# Patient Record
Sex: Female | Born: 1975 | Race: White | Hispanic: No | Marital: Married
Health system: Southern US, Academic
[De-identification: ages and names within clinical notes are randomized; demographics above are authoritative.]

## PROBLEM LIST (undated history)

## (undated) ENCOUNTER — Encounter

## (undated) ENCOUNTER — Ambulatory Visit

## (undated) ENCOUNTER — Encounter: Attending: Family Medicine | Primary: Family Medicine

## (undated) ENCOUNTER — Ambulatory Visit: Payer: BLUE CROSS/BLUE SHIELD | Attending: Family Medicine | Primary: Family Medicine

## (undated) ENCOUNTER — Ambulatory Visit: Payer: PRIVATE HEALTH INSURANCE | Attending: Foot and Ankle Surgery | Primary: Foot and Ankle Surgery

## (undated) ENCOUNTER — Encounter
Attending: Student in an Organized Health Care Education/Training Program | Primary: Student in an Organized Health Care Education/Training Program

## (undated) ENCOUNTER — Telehealth

## (undated) ENCOUNTER — Ambulatory Visit: Payer: PRIVATE HEALTH INSURANCE

## (undated) ENCOUNTER — Ambulatory Visit: Attending: Pharmacist | Primary: Pharmacist

## (undated) ENCOUNTER — Encounter: Attending: Diagnostic Radiology | Primary: Diagnostic Radiology

## (undated) ENCOUNTER — Ambulatory Visit: Payer: PRIVATE HEALTH INSURANCE | Attending: Family Medicine | Primary: Family Medicine

## (undated) ENCOUNTER — Ambulatory Visit: Payer: PRIVATE HEALTH INSURANCE | Attending: Otolaryngology | Primary: Otolaryngology

## (undated) ENCOUNTER — Encounter: Attending: Internal Medicine | Primary: Internal Medicine

## (undated) ENCOUNTER — Encounter: Attending: Orthopedic | Primary: Orthopedic

## (undated) ENCOUNTER — Encounter: Attending: Gastroenterology | Primary: Gastroenterology

## (undated) ENCOUNTER — Ambulatory Visit
Payer: PRIVATE HEALTH INSURANCE | Attending: Student in an Organized Health Care Education/Training Program | Primary: Student in an Organized Health Care Education/Training Program

## (undated) ENCOUNTER — Ambulatory Visit: Payer: PRIVATE HEALTH INSURANCE | Attending: Registered" | Primary: Registered"

## (undated) ENCOUNTER — Telehealth: Attending: Otolaryngology | Primary: Otolaryngology

## (undated) ENCOUNTER — Ambulatory Visit: Payer: BLUE CROSS/BLUE SHIELD

## (undated) ENCOUNTER — Encounter: Attending: Registered" | Primary: Registered"

## (undated) ENCOUNTER — Ambulatory Visit
Payer: BLUE CROSS/BLUE SHIELD | Attending: Student in an Organized Health Care Education/Training Program | Primary: Student in an Organized Health Care Education/Training Program

## (undated) ENCOUNTER — Ambulatory Visit: Payer: PRIVATE HEALTH INSURANCE | Attending: Rheumatology | Primary: Rheumatology

## (undated) ENCOUNTER — Telehealth: Attending: Rheumatology | Primary: Rheumatology

## (undated) ENCOUNTER — Telehealth: Attending: Family Medicine | Primary: Family Medicine

## (undated) ENCOUNTER — Encounter: Attending: Orthopaedic Surgery | Primary: Orthopaedic Surgery

## (undated) ENCOUNTER — Ambulatory Visit
Attending: Student in an Organized Health Care Education/Training Program | Primary: Student in an Organized Health Care Education/Training Program

## (undated) ENCOUNTER — Other Ambulatory Visit

## (undated) ENCOUNTER — Ambulatory Visit: Attending: Family | Primary: Family

## (undated) ENCOUNTER — Ambulatory Visit: Payer: PRIVATE HEALTH INSURANCE | Attending: Internal Medicine | Primary: Internal Medicine

## (undated) ENCOUNTER — Telehealth: Attending: Dental | Primary: Dental

## (undated) ENCOUNTER — Ambulatory Visit: Attending: Nurse Practitioner | Primary: Nurse Practitioner

## (undated) ENCOUNTER — Encounter: Attending: Rheumatology | Primary: Rheumatology

## (undated) ENCOUNTER — Ambulatory Visit: Payer: PRIVATE HEALTH INSURANCE | Attending: Women's Health | Primary: Women's Health

## (undated) ENCOUNTER — Telehealth: Payer: PRIVATE HEALTH INSURANCE | Attending: Registered" | Primary: Registered"

## (undated) ENCOUNTER — Encounter: Attending: Geriatric Medicine | Primary: Geriatric Medicine

## (undated) ENCOUNTER — Ambulatory Visit: Payer: BLUE CROSS/BLUE SHIELD | Attending: Geriatric Medicine | Primary: Geriatric Medicine

## (undated) DIAGNOSIS — M329 Systemic lupus erythematosus, unspecified: Secondary | ICD-10-CM

## (undated) DIAGNOSIS — M3500 Sicca syndrome, unspecified: Secondary | ICD-10-CM

## (undated) DIAGNOSIS — IMO0002 Reserved for concepts with insufficient information to code with codable children: Secondary | ICD-10-CM

## (undated) HISTORY — PX: TUBAL LIGATION: SHX77

## (undated) HISTORY — PX: ABDOMINAL HYSTERECTOMY: SHX81

## (undated) HISTORY — PX: CARPAL TUNNEL RELEASE: SHX101

## (undated) HISTORY — PX: HERNIA REPAIR: SHX51

## (undated) MED ORDER — LORATADINE 10 MG TABLET: Freq: Every day | ORAL | 0.00000 days

## (undated) MED ORDER — HYDROXYCHLOROQUINE 200 MG TABLET: Freq: Two times a day (BID) | ORAL | 0.00000 days

## (undated) MED ORDER — MELATONIN 1 MG TABLET: Freq: Every evening | ORAL | 0.00000 days

## (undated) MED ORDER — BENLYSTA 200 MG/ML SUBCUTANEOUS AUTO-INJECTOR: 200 mg | mL | 2 refills | 28 days

## (undated) MED ORDER — LIDOCAINE 4 %-MENTHOL 1 % TOPICAL PATCH: TOPICAL | 0 days

---
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
## 1999-04-20 ENCOUNTER — Inpatient Hospital Stay (HOSPITAL_COMMUNITY): Admission: AD | Admit: 1999-04-20 | Discharge: 1999-04-20 | Payer: Self-pay | Admitting: *Deleted

## 1999-04-20 ENCOUNTER — Encounter: Payer: Self-pay | Admitting: *Deleted

## 1999-06-23 ENCOUNTER — Encounter: Payer: Self-pay | Admitting: Emergency Medicine

## 1999-06-23 ENCOUNTER — Emergency Department (HOSPITAL_COMMUNITY): Admission: EM | Admit: 1999-06-23 | Discharge: 1999-06-23 | Payer: Self-pay | Admitting: Emergency Medicine

## 1999-10-04 ENCOUNTER — Encounter: Admission: RE | Admit: 1999-10-04 | Discharge: 1999-12-12 | Payer: Self-pay | Admitting: Obstetrics & Gynecology

## 1999-10-05 ENCOUNTER — Observation Stay (HOSPITAL_COMMUNITY): Admission: AD | Admit: 1999-10-05 | Discharge: 1999-10-06 | Payer: Self-pay | Admitting: *Deleted

## 1999-10-08 ENCOUNTER — Inpatient Hospital Stay (HOSPITAL_COMMUNITY): Admission: AD | Admit: 1999-10-08 | Discharge: 1999-10-08 | Payer: Self-pay | Admitting: Obstetrics and Gynecology

## 1999-10-12 ENCOUNTER — Encounter: Payer: Self-pay | Admitting: Obstetrics & Gynecology

## 1999-10-12 ENCOUNTER — Ambulatory Visit (HOSPITAL_COMMUNITY): Admission: RE | Admit: 1999-10-12 | Discharge: 1999-10-12 | Payer: Self-pay | Admitting: Obstetrics & Gynecology

## 1999-11-13 ENCOUNTER — Inpatient Hospital Stay (HOSPITAL_COMMUNITY): Admission: AD | Admit: 1999-11-13 | Discharge: 1999-11-13 | Payer: Self-pay | Admitting: Obstetrics and Gynecology

## 1999-11-24 ENCOUNTER — Inpatient Hospital Stay (HOSPITAL_COMMUNITY): Admission: AD | Admit: 1999-11-24 | Discharge: 1999-11-24 | Payer: Self-pay | Admitting: Obstetrics and Gynecology

## 1999-12-09 ENCOUNTER — Inpatient Hospital Stay (HOSPITAL_COMMUNITY): Admission: AD | Admit: 1999-12-09 | Discharge: 1999-12-09 | Payer: Self-pay | Admitting: Obstetrics and Gynecology

## 1999-12-15 ENCOUNTER — Inpatient Hospital Stay (HOSPITAL_COMMUNITY): Admission: AD | Admit: 1999-12-15 | Discharge: 1999-12-17 | Payer: Self-pay | Admitting: Obstetrics & Gynecology

## 1999-12-20 ENCOUNTER — Encounter: Admission: RE | Admit: 1999-12-20 | Discharge: 2000-03-19 | Payer: Self-pay | Admitting: Obstetrics & Gynecology

## 2000-01-24 ENCOUNTER — Other Ambulatory Visit: Admission: RE | Admit: 2000-01-24 | Discharge: 2000-01-24 | Payer: Self-pay | Admitting: Obstetrics & Gynecology

## 2000-02-01 ENCOUNTER — Emergency Department (HOSPITAL_COMMUNITY): Admission: EM | Admit: 2000-02-01 | Discharge: 2000-02-01 | Payer: Self-pay | Admitting: Emergency Medicine

## 2000-02-02 ENCOUNTER — Encounter: Payer: Self-pay | Admitting: Emergency Medicine

## 2000-02-02 ENCOUNTER — Inpatient Hospital Stay (HOSPITAL_COMMUNITY): Admission: EM | Admit: 2000-02-02 | Discharge: 2000-02-05 | Payer: Self-pay | Admitting: Emergency Medicine

## 2000-02-02 ENCOUNTER — Encounter (INDEPENDENT_AMBULATORY_CARE_PROVIDER_SITE_OTHER): Payer: Self-pay | Admitting: Specialist

## 2000-02-04 ENCOUNTER — Encounter: Payer: Self-pay | Admitting: Surgery

## 2003-06-18 ENCOUNTER — Other Ambulatory Visit: Admission: RE | Admit: 2003-06-18 | Discharge: 2003-06-18 | Payer: Self-pay | Admitting: Family Medicine

## 2003-06-19 ENCOUNTER — Encounter: Admission: RE | Admit: 2003-06-19 | Discharge: 2003-06-19 | Payer: Self-pay | Admitting: Family Medicine

## 2004-07-01 ENCOUNTER — Encounter: Admission: RE | Admit: 2004-07-01 | Discharge: 2004-07-01 | Payer: Self-pay | Admitting: Family Medicine

## 2005-02-21 ENCOUNTER — Emergency Department: Payer: Self-pay | Admitting: Emergency Medicine

## 2005-05-30 ENCOUNTER — Ambulatory Visit: Payer: Self-pay

## 2005-06-22 ENCOUNTER — Ambulatory Visit: Payer: Self-pay

## 2005-07-07 ENCOUNTER — Observation Stay: Payer: Self-pay | Admitting: Obstetrics & Gynecology

## 2005-07-20 ENCOUNTER — Ambulatory Visit: Payer: Self-pay

## 2005-08-10 ENCOUNTER — Observation Stay: Payer: Self-pay | Admitting: Obstetrics & Gynecology

## 2005-12-28 ENCOUNTER — Ambulatory Visit: Payer: Self-pay | Admitting: Obstetrics & Gynecology

## 2007-01-04 ENCOUNTER — Emergency Department: Payer: Self-pay | Admitting: Emergency Medicine

## 2007-01-15 ENCOUNTER — Ambulatory Visit: Payer: Self-pay | Admitting: Obstetrics & Gynecology

## 2007-01-22 ENCOUNTER — Ambulatory Visit: Payer: Self-pay | Admitting: Obstetrics & Gynecology

## 2007-05-26 ENCOUNTER — Ambulatory Visit: Payer: Self-pay | Admitting: Emergency Medicine

## 2007-05-27 ENCOUNTER — Encounter: Admission: RE | Admit: 2007-05-27 | Discharge: 2007-05-27 | Payer: Self-pay | Admitting: Family Medicine

## 2010-10-07 NOTE — H&P (Signed)
[facility]  Patient:    Beth Byrd, Beth Byrd                      MRN: [id number] Adm. Date:  [id number] Attending:  Genia Del                         History and Physical  DATE OF BIRTH:                Jan 04, 1976                                The patient is a gravida 2, para 1, 39-week and 5-days gestation by ultrasound dating with an expected date of surgery of December 17, 1999.  REASONS FOR ADMISSION:        Induction _______, gestational diabetes, class ___ with suspicion of macrosomia and lower back pain.  HISTORY OF PRESENT ILLNESS:   Fetal movements positive.  No vaginal bleeding. No fluid leaks.  No regular uterine contractions.  No _____ symptoms.  Very uncomfortable with lower back pain times many weeks.  Her gestational diabetes, class ___ was fairly well-controlled except for occasional fasting blood sugars ranging between 90 and 100.  PAST MEDICAL HISTORY:         Negative.  PAST SURGICAL HISTORY:        Two hernia operations about three months and 35 years of age.  A C-section in December of 1995.  OBSTETRICAL HISTORY:          In December of 1995, 39-weeks emergency C-section for fetal distress, group B Strep positive, baby effected, but with good obtund.  Baby girl of 7 pounds and 6 ounces.  In December of 1997, at 39-weeks, successful VBAC.  Had vaginal bleeding, possible placental separation ________, weighing 9 pounds and 8 ounces, vaginal and had gestational anemia of both pregnancies.  GYNECOLOGY:                   A series of _______ with no treatment.  Pap tests after 1997 were normal.  ALLERGIES:                    ASA.  MEDICATIONS:                  Prenatal vitamins.  SOCIAL HISTORY:               Married and nonsmoker.  HISTORY OF PRESENT PREGNANCY: First trimester unremarkable.  LABORATORY DATA:              Hemoglobin 13.3, platelets 189.  Blood type _____-positive.  Rh antibody is negative.  RPR  nonreactive.  Rubella titer immune.  HBsAg negative.  HIV nonreactive.  She had a _______ at 16 weeks which was within normal limits.  A 1-hour PTT was abnormal at 17 plus weeks, and then 18 plus weeks, three PTT was within normal limits.  At 28 plus weeks, ultrasound revealed normal review of anatomy.  ________ intact.  Starting at 24 plus week gestation, she started to have preterm uterine contractions, and she was put on bed rest and Procardia, and then this was changed to terbutaline.  She has minimal preterm cervical pain and remained stable until now.  Repeat ultrasound done at 29 plus weeks showed a large for gestational age fetus at the 87th percentile  with normal amniotic fluid and bag.  She had a repeat 1-hour PTT at 27 plus weeks which was abnormal and a 3-hour PTT was abnormal as well.  So, dietician instructed to do blood sugars.  Diabetes control was good on ________ except for occasional fasting blood sugars between 90 and 100.                                She was very uncomfortable and presented many times because of lower back pain.  She consulted with physical therapy and was given advises.  She was eventually put on Flexeril to have relaxation of muscles in her low back.  Gestational anemia developed and she was started on iron sulfate t.i.d.  A group B Strep was done at 25 plus weeks which was negative, but because of past history of an effected neonate, the decision was made to cover her with antibiotics at the time of labor.  An ultrasound was repeated at 35 plus weeks and this showed _______ for weight and 99th percentile for amniotic fluid index.  Amniotic fluid came back normal at 37 plus weeks.  Her blood pressures remained normal throughout pregnancy.  REVIEW OF SYSTEMS:            ________ are negative.  HEENT within normal limits.  Respiratory negative.  Cardiovascular negative.  GI negative.  GU negative and neurologic negative.  PHYSICAL  EXAMINATION:  GENERAL:                      No apparent distress.  VITAL SIGNS:                  On admission, the blood pressure was 133/67, pulse 65, respiratory rate 20, and temperature 98.0.  LUNGS:                        Clear.  HEART:                        Regular cardiac rhythm.  No murmur.  ABDOMEN:                      Gravid with uterine height of 40 cm, vertex presentation.  VAGINAL EXAMINATION:          On admission was 2 cm dilated, 60% effaced, vertex minus 2.  Membranes intact.  EXTREMITIES:                  Lower limbs showed mild edema, no clonus.                                Monitoring showed very reactive NST with a baseline at 140-145.  Good accelerations and no decelerations.  Uterine contractions were rate.  IMPRESSION:                   Gravida 2, para 1, previous successful vaginal birth after cesarean section with gestational diabetes, class _____, fairly well-controlled with suspicion of macrosomia with history of positive group B Strep effected baby for induction.  PLAN:                         Admit to [facility] _______ and monitoring.  Start Pitocin low dose and  eventual artificial rupture of membranes, penicillin-G per protocol for history of group B Strep. DD:  12/15/99 TD:  12/15/99 Job: [id number]

## 2010-10-07 NOTE — Op Note (Signed)
[facility]  Patient:    Beth Byrd, Beth Byrd                      MRN: [id number] Proc. Date: 02/03/00 Adm. Date:  [id number] Disc. Date: [id number] Attending:  Abigail Miyamoto A                           Operative Report  PREOPERATIVE DIAGNOSES:  Cholelithiasis and choledocholithiasis.  POSTOPERATIVE DIAGNOSES:  Cholelithiasis and choledocholithiasis.  OPERATION PERFORMED:  Laparoscopic cholecystectomy with cholangiogram.  SURGEON:  Dr. Orson Slick.  ASSISTANT:  Dr. Derrell Lolling.  ANESTHESIA:  General.  DESCRIPTION OF PROCEDURE:  After the patient was monitored and anesthetized and after routine preparation and draping of the abdomen, I made a small transverse infraumbilical incision, opened the fascia longitudinally, opened the peritoneum bluntly and placed an #0 Vicryl pursestring suture in the fascia, secured a Hasson cannula and inflated the abdomen with CO2. I did a laparoscopy finding no obvious abnormalities. The gallbladder was not distended. The liver appeared normal as did all of the pelvic organs and the small and large intestine as far as could be seen. There were a few adhesions in the pelvis. I placed 3 additional ports under direct vision and placed the patient head up foot down and tilted to the left. I then grasped the fundus of the gallbladder and elevated it and retracted the infundibulum laterally. There was good tendency of tissue to be very elastic making initial dissection somewhat difficult but I clearly I identified the cystic duct emerging from the infundibulum of the gallbladder. I found the cystic artery, clipped it with 3 clips and divided between the 2 closer to the gallbladder. I placed a clip toward the infundibulum of the gallbladder and opened the cystic duct. It was quite large. I, with some difficulty, put in a Reddick cholangiogram catheter and performed a fluoroscopic cholangiogram. The common bile duct and intrahepatic  ducts were quite large. There was obstruction of flow into the duodenum at the distal end. I could not see any definite gallstone or other reason for the obstruction but assumed it to be gallstones. I removed the cholangiogram catheter and made attempts to cannulate the cystic duct and dissected a bit further and tried passing down a balloon catheter but simply could not get it to go down into the common duct. I felt that with the abnormality being as it was at the far distal end of the common duct, this could be approached endoscopically and I did not feel that conversion to an open common duct exploration procedure would be warranted. I placed 3 clips distal to the hole in the cystic duct and then divided the cystic duct. I dissected the gallbladder from the liver carefully utilizing the spatula and cautery and removed the gallbladder intact from the liver. I got good hemostasis with the cautery. I then removed the gallbladder from the body through the umbilical incision and tied that pursestring suture. I then irrigated the right upper quadrant with saline solution and removed the irrigant. Sponge, needle and instrument counts were correct. I anesthetized all the incisions with 0.5% Marcaine with epinephrine, released the CO2 and then removed the ports. I closed the skin of all the incisions with intracuticular 4-0 Vicryl and Steri-Strips. The patient tolerated the procedure well. DD:  02/06/00 TD:  02/07/00 Job: [id number]

## 2010-10-07 NOTE — Procedures (Signed)
[facility]  Patient:    Beth Byrd, Beth Byrd                      MRN: [id number] Proc. Date: 02/04/00 Adm. Date:  [id number] Disc. Date: [id number] Attending:  Shelly Rubenstein CC:         Zigmund Daniel, M.D.   Procedure Report  PROCEDURE:  Endoscopic retrograde cholangiopancreatography with biliary sphincterotomy and common duct stone extraction.  INDICATIONS FOR PROCEDURE:  Retained common duct stone post laparoscopic cholecystectomy.  HISTORY OF PRESENT ILLNESS:  This is a 35 year old white female who was 7 weeks post partum and presented to the hospital with a 2 week history of mid epigastric burning pain associated with daily nausea and vomiting. she was found to have cholelithiasis and abnormal liver tests. She underwent laparoscopic cholecystectomy yesterday. Intraoperative cholangiogram revealed no emptying of the distal bile duct despite balloon catheter placement. She is suspected as having distal stone or stones. Her liver tests remain abnormal. She is now for ERCP with possible sphincterotomy and stone extraction. The nature of this procedure as well as its risks, benefits, and alternatives were discussed in detail with the patient again today. She understood and agreed to proceed.  PHYSICAL EXAMINATION:  GENERAL:  Well appearing female in no acute distress. She is alert and oriented.  VITAL SIGNS:  Stable.  LUNGS:  Clear.  HEART:  Regular.  ABDOMEN:  Soft with tenderness around the surgical wound sites.  DESCRIPTION OF PROCEDURE:  After informed consent was obtained, the patient was sedated with 90 mg of Demerol and 8.5 mg of Versed IV. Preoperative Cefotan was continued. Glucagon 1.0 mg IV was given as a duodenal relaxant. The Olympus side viewing endoscope was then passed blindly into the esophagus. The stomach was not examined thoroughly. The duodenal bulb and post bulbar duodenum including the major ampulla was entirely  normal. The minor ampulla was not sought.  X-RAY FINDINGS: 1. Scout radiograph of the abdomen with the endoscope in position revealed    surgical clips. 2. Initial filling via the  major ampulla yielded a normal pancreatogram. 3. Biliary tree within deeply cannulated and completely filled with contrast.    Biliary tree appeared normal post cholecystectomy. The common bile duct    diameter was approximately 6-7 mm. No obvious stones though there did    appear to be debris in the distal duct upon the initial injection.  THERAPY:  A hydrophilic guidewire was placed in the proximal biliary tree. Over the guidewire, a standard sphincterotomy was performed with cutting carried out in the 12 oclock orientation. The sphincterotomy size was deemed large. A cutting catheter was exchanged for an 8.5 mm balloon. This was pulled through the duct several times with only minimal amounts of sludge extracted. Post extraction and occlusion of the cholangiogram demonstrated no residual filling defects. The system drained well.  IMPRESSION:  Retained biliary sludge status post ERCP with sphincterotomy and sludge extraction.  RECOMMENDATIONS: 1. Continue antibiotics for an additional 24 hours. 2. Advance diet as tolerated with anticipated discharge in a.m. DD:  02/04/00 TD:  02/06/00 Job: [id number]

## 2010-10-07 NOTE — Discharge Summary (Signed)
[facility]  Patient:    Beth Byrd, Beth Byrd                      MRN: [id number] Adm. Date:  [id number] Disc. Date: [id number] Attending:  Abigail Miyamoto A CC:         Wilhemina Bonito. Eda Keys., M.D. [facility]   Discharge Summary  HISTORY OF PRESENT ILLNESS:  The patient is a generally healthy 35 year old white female presenting with abdominal pain.  Ultrasound showed stones in the gallbladder.  There was question of dilatation of the common bile duct.  The liver transaminases were moderately elevated, and bilirubin was 1.2. Remainder of the laboratories were not remarkable.  Exam was remarkable for mild upper abdominal tenderness.  HOSPITAL COURSE:  The patient was admitted by Dr. Magnus Ivan.  He provided IV Cefotan, IV fluid support, and pain medicine.  Liver enzymes improved somewhat.  On February 03, 2000, I saw the patient and advised laparoscopic cholecystectomy with cholangiogram.  The procedure took place on that date with the patients permission, and findings were of chronic cholecystitis and cholelithiasis with somewhat dilated cystic and common ducts.  The cholangiogram showed obstruction at the distal duct by sludge or small stones. I could not thread a balloon catheter or other device down the cystic duct.  I requested a gastroenterology consultation, and the patient was seen by Dr. Yancey Flemings on that date.  He recommended ERCP with sphincterotomy and stone extraction, and I concurred.  That took place on February 04, 2000, and the patient had an uneventful extraction of stones and sludge.  She was doing much better the next day and requested discharge.  There was no evident complication of either the cholecystectomy or the ERCP with sphincterotomy. FOLLOW-UP: She was sent home with arrangements made for follow-up with me in three to four weeks or as necessary.  DIAGNOSES: 1. Cholelithiasis. 2. Choledocholithiasis. 3. Chronic  cholecystitis.  OPERATIONS: 1. Laparoscopic cholecystectomy with cholangiogram. 2. ERCP with sphincterotomy and stone extraction.  CONDITION ON DISCHARGE:  Improved and stable. DD:  02/13/00 TD:  02/14/00 Job: [id number]

## 2010-10-07 NOTE — Discharge Summary (Signed)
[facility]  Patient:    Beth Byrd, Beth Byrd                      MRN: [id number] Adm. Date:  [id number] Disc. Date: [id number] Attending:  Silverio Lay A                           Discharge Summary  DISCHARGE DIAGNOSES:          1. Preterm labor at [redacted] weeks gestational age,                                  discharged undelivered.                               2. Class A1 diabetes mellitus.  HISTORY OF PRESENT ILLNESS:   A 35 year old woman, gravida 3, para 2, [facility] November 27, 1999, admitted at 29-6/[redacted] weeks gestational age for management of preterm labor which was unresponsive to outpatient oral tocolytics and subcutaneous Terbutaline. The patient denied rupture of membranes, bleeding, or dysuria.  She received Procardia for several weeks for preterm labor. Betamethasone had been given on ay 15 and Oct 05, 1999.  Cervical change was noted in the office and she was sent o the [facility].  Antenatal course has been remarkable for gestational diabetes mellitus.  HOSPITAL COURSE:              She was seen at [facility] [place]. Subcutaneous Terbutaline was given. This did not stop her contractions.  Her cervix was 1, 50, and 0 station.  She was admitted for magnesium sulfate therapy. Antibiotics were given prophylactically.  Magnesium stopped the contractions. t was tapered the following day and Nifedipine was reinitiated every six hours. he was discharged to home in satisfactory condition.  She will continue bed rest and pelvic rest.  FOLLOW-UP:                    Will be in the office in one week.  DIET:                         21 kilocalorie ADA diet was advised. DD:  11/09/99 TD:  11/10/99 Job: [id number]

## 2013-02-12 ENCOUNTER — Ambulatory Visit: Payer: Self-pay | Admitting: Internal Medicine

## 2013-10-22 ENCOUNTER — Emergency Department: Payer: Self-pay | Admitting: Emergency Medicine

## 2013-10-22 LAB — CBC
HCT: 41.6 % (ref 35.0–47.0)
HGB: 13.7 g/dL (ref 12.0–16.0)
MCH: 30 pg (ref 26.0–34.0)
MCHC: 33.1 g/dL (ref 32.0–36.0)
MCV: 91 fL (ref 80–100)
Platelet: 129 10*3/uL — ABNORMAL LOW (ref 150–440)
RBC: 4.58 10*6/uL (ref 3.80–5.20)
RDW: 12.7 % (ref 11.5–14.5)
WBC: 3 10*3/uL — AB (ref 3.6–11.0)

## 2013-10-22 LAB — URINALYSIS, COMPLETE
BILIRUBIN, UR: NEGATIVE
BLOOD: NEGATIVE
GLUCOSE, UR: NEGATIVE mg/dL (ref 0–75)
Ketone: NEGATIVE
NITRITE: NEGATIVE
PH: 5 (ref 4.5–8.0)
PROTEIN: NEGATIVE
SPECIFIC GRAVITY: 1.026 (ref 1.003–1.030)
WBC UR: 2 /HPF (ref 0–5)

## 2013-10-22 LAB — COMPREHENSIVE METABOLIC PANEL
ALBUMIN: 3.7 g/dL (ref 3.4–5.0)
ALK PHOS: 66 U/L
ALT: 92 U/L — AB (ref 12–78)
AST: 58 U/L — AB (ref 15–37)
Anion Gap: 5 — ABNORMAL LOW (ref 7–16)
BUN: 11 mg/dL (ref 7–18)
Bilirubin,Total: 0.4 mg/dL (ref 0.2–1.0)
CALCIUM: 9 mg/dL (ref 8.5–10.1)
CHLORIDE: 108 mmol/L — AB (ref 98–107)
CO2: 27 mmol/L (ref 21–32)
Creatinine: 0.77 mg/dL (ref 0.60–1.30)
EGFR (Non-African Amer.): >60
GLUCOSE: 96 mg/dL (ref 65–99)
Osmolality: 279 (ref 275–301)
POTASSIUM: 4 mmol/L (ref 3.5–5.1)
SODIUM: 140 mmol/L (ref 136–145)
Total Protein: 8 g/dL (ref 6.4–8.2)

## 2013-10-25 LAB — URINE CULTURE

## 2013-11-24 ENCOUNTER — Ambulatory Visit: Payer: Self-pay

## 2013-12-04 ENCOUNTER — Ambulatory Visit: Payer: Self-pay

## 2014-05-21 ENCOUNTER — Ambulatory Visit: Payer: Self-pay | Admitting: Internal Medicine

## 2014-12-18 ENCOUNTER — Encounter: Payer: Self-pay | Admitting: Emergency Medicine

## 2014-12-18 ENCOUNTER — Ambulatory Visit
Admission: EM | Admit: 2014-12-18 | Discharge: 2014-12-18 | Disposition: A | Discharge status: Home / Self Care | Payer: BLUE CROSS/BLUE SHIELD | Attending: Internal Medicine | Admitting: Internal Medicine

## 2014-12-18 DIAGNOSIS — B349 Viral infection, unspecified: Secondary | ICD-10-CM | POA: Diagnosis not present

## 2014-12-18 HISTORY — DX: Reserved for concepts with insufficient information to code with codable children: IMO0002

## 2014-12-18 HISTORY — DX: Systemic lupus erythematosus, unspecified: M32.9

## 2014-12-18 HISTORY — DX: Sjogren syndrome, unspecified: M35.00

## 2014-12-18 MED ORDER — MAGIC MOUTHWASH W/LIDOCAINE: ORAL | Status: AC

## 2014-12-18 NOTE — ED Notes (Signed)
Provider went over paperwork discharge information with pt. Pt was ambulatory leaving the clinic

## 2014-12-18 NOTE — ED Provider Notes (Signed)
CSN: [id number]     Arrival date & time 12/18/14  1354 History   First MD Initiated Contact with Patient 12/18/14 1435     Chief Complaint  Patient presents with  . Mouth Lesions   (Consider location/radiation/quality/duration/timing/severity/associated sxs/prior Treatment) HPI  39 yo F with 2 day hx of mild malaise, fatigue, decreased appetite- feels like she has a "bug"  Had mouth tenderness yesterday and now blisters on roof of mouth.  Takes care of 2 yo grandson almost daily. He has generally been well.  She has Lupus and Sjogrens ,stays anxious about developing symptoms Past Medical History  Diagnosis Date  . Sjogren's disease   . Lupus    Past Surgical History  Procedure Laterality Date  . Abdominal hysterectomy    . Hernia repair    . Tubal ligation    . Carpal tunnel release    . Cesarean section     Family History  Problem Relation Age of Onset  . Heart attack Mother   . Hypertension Mother   . Diabetes Maternal Grandmother   . Hypertension Maternal Grandmother   . Diabetes Paternal Grandmother   . Heart attack Paternal Grandmother    History  Substance Use Topics  . Smoking status: Never Smoker   . Smokeless tobacco: Not on file  . Alcohol Use: No   OB History    No data available     Review of Systems Review of 10 systems negative for acute change except as referenced in HPI Allergies  Asa; Fish allergy; and Shellfish allergy  Home Medications   Prior to Admission medications   Medication Sig Start Date End Date Taking? Authorizing Provider  Alum & Mag Hydroxide-Simeth (MAGIC MOUTHWASH W/LIDOCAINE) SOLN Swish - Hold - Spit -    1 tsp / 5 cc 12/18/14   Rae Halsted, PA-C   BP 122/50 mmHg  Pulse 61  Temp(Src) 98.1 F (36.7 C) (Oral)  Resp 16  SpO2 100% Physical Exam   Constitutional -alert and oriented,mildly fatigued. Family vacation begins in 2 days and she is concerned about not feeling up to packing etc Head-atraumatic, normocephalic Eyes-  conjunctiva normal, EOMI ,conjugate gaze Nose- no congestion or rhinorrhea Mouth/throat- mucous membranes moist ,oropharynx non-erythematous, posterior palate with clear blsiters on erythematous base. Tender to tongue touch. Neck- supple without glandular enlargement CV- regular rate, grossly normal heart sounds,  Resp-no distress, normal respiratory effort,clear to auscultation bilaterally GI- no distention GU- not examined MSK- non tender, normal ROM, all extremities, ambulatory, self-care Neuro- normal speech and language, no gross focal neurological deficit appreciated,  Skin-warm,dry ,intact; no rash noted- palms and soles are clear- no evidence blisters or other lesions Psych-mood and affect grossly normal; speech and behavior grossly normal ED Course  Procedures (including critical care time) Labs Review Labs Reviewed - No data to display  Imaging Review No results found.   MDM   1. Viral syndrome    Diagnosis and treatment discussed.Viral syndrome,suspect Hand ,Foot, Mouth.-discussed possible development of hand/foot lesions. Self limiting. Treat symptoms. Given Rx swish-hold -spit. Rest/hydrate. . Questions fielded, expectations and recommendations reviewed. Patient expresses understanding. Will return to [facility] with questions, concern or exacerbation.     Rae Halsted, PA-C 12/18/14 [phone]

## 2014-12-18 NOTE — ED Notes (Signed)
Pt has some sores in the roof of her mouth she states she believes its from her Sjogrens
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
## 2016-12-13 MED ORDER — NALTREXONE 8 MG-BUPROPION 90 MG TABLET,EXTENDED RELEASE
ORAL_TABLET | Freq: Every day | ORAL | 0 refills | 0.00000 days | Status: CP
Start: 2016-12-13 — End: 2017-02-19

## 2016-12-25 ENCOUNTER — Ambulatory Visit
Admission: RE | Admit: 2016-12-25 | Discharge: 2016-12-25 | Disposition: A | Discharge status: Home / Self Care | Payer: BC Managed Care – PPO | Attending: Rheumatology | Admitting: Rheumatology

## 2016-12-25 DIAGNOSIS — R740 Nonspecific elevation of levels of transaminase and lactic acid dehydrogenase [LDH]: Secondary | ICD-10-CM

## 2016-12-25 DIAGNOSIS — M329 Systemic lupus erythematosus, unspecified: Principal | ICD-10-CM

## 2016-12-25 MED ORDER — ONDANSETRON 4 MG DISINTEGRATING TABLET
ORAL_TABLET | Freq: Three times a day (TID) | ORAL | 0 refills | 0 days | Status: CP | PRN
Start: 2016-12-25 — End: 2018-06-03

## 2017-02-19 ENCOUNTER — Ambulatory Visit: Admission: RE | Admit: 2017-02-19 | Discharge: 2017-02-19 | Payer: BC Managed Care – PPO

## 2017-02-19 DIAGNOSIS — F331 Major depressive disorder, recurrent, moderate: Principal | ICD-10-CM

## 2017-02-19 DIAGNOSIS — Z23 Encounter for immunization: Secondary | ICD-10-CM

## 2017-02-19 DIAGNOSIS — F411 Generalized anxiety disorder: Secondary | ICD-10-CM

## 2017-02-19 DIAGNOSIS — J069 Acute upper respiratory infection, unspecified: Secondary | ICD-10-CM

## 2017-02-19 DIAGNOSIS — B9789 Other viral agents as the cause of diseases classified elsewhere: Secondary | ICD-10-CM

## 2017-02-19 MED ORDER — NALTREXONE 8 MG-BUPROPION 90 MG TABLET,EXTENDED RELEASE
ORAL_TABLET | Freq: Two times a day (BID) | ORAL | 2 refills | 0.00000 days | Status: CP
Start: 2017-02-19 — End: 2017-02-19

## 2017-02-19 MED ORDER — NALTREXONE 8 MG-BUPROPION 90 MG TABLET,EXTENDED RELEASE: 2 | tablet | Freq: Two times a day (BID) | 0 refills | 0 days | Status: AC

## 2017-03-01 MED ORDER — HYDROXYCHLOROQUINE 200 MG TABLET
ORAL_TABLET | 0 refills | 0 days | Status: CP
Start: 2017-03-01 — End: 2017-04-17

## 2017-04-17 MED ORDER — HYDROXYCHLOROQUINE 200 MG TABLET
ORAL_TABLET | Freq: Two times a day (BID) | ORAL | 1 refills | 0 days | Status: CP
Start: 2017-04-17 — End: 2017-06-29

## 2017-05-24 ENCOUNTER — Emergency Department
Admit: 2017-05-24 | Discharge: 2017-05-24 | Disposition: A | Discharge status: Home / Self Care | Payer: PRIVATE HEALTH INSURANCE | Attending: Family

## 2017-05-24 ENCOUNTER — Ambulatory Visit
Admit: 2017-05-24 | Discharge: 2017-05-24 | Disposition: A | Discharge status: Home / Self Care | Payer: PRIVATE HEALTH INSURANCE | Attending: Family

## 2017-05-24 DIAGNOSIS — M25572 Pain in left ankle and joints of left foot: Principal | ICD-10-CM

## 2017-05-25 ENCOUNTER — Ambulatory Visit
Admit: 2017-05-25 | Discharge: 2017-05-25 | Disposition: A | Discharge status: Home / Self Care | Payer: PRIVATE HEALTH INSURANCE

## 2017-05-25 ENCOUNTER — Emergency Department
Admit: 2017-05-25 | Discharge: 2017-05-25 | Disposition: A | Discharge status: Home / Self Care | Payer: PRIVATE HEALTH INSURANCE

## 2017-05-25 DIAGNOSIS — S99922A Unspecified injury of left foot, initial encounter: Principal | ICD-10-CM

## 2017-05-29 ENCOUNTER — Ambulatory Visit
Admit: 2017-05-29 | Discharge: 2017-05-30 | Payer: PRIVATE HEALTH INSURANCE | Attending: Family Medicine | Primary: Family Medicine

## 2017-05-29 DIAGNOSIS — Z23 Encounter for immunization: Secondary | ICD-10-CM

## 2017-05-29 DIAGNOSIS — F331 Major depressive disorder, recurrent, moderate: Secondary | ICD-10-CM

## 2017-05-29 DIAGNOSIS — F411 Generalized anxiety disorder: Secondary | ICD-10-CM

## 2017-05-29 DIAGNOSIS — M25572 Pain in left ankle and joints of left foot: Principal | ICD-10-CM

## 2017-05-29 MED ORDER — NALTREXONE 8 MG-BUPROPION 90 MG TABLET,EXTENDED RELEASE
ORAL_TABLET | Freq: Two times a day (BID) | ORAL | 0 refills | 0 days | Status: CP
Start: 2017-05-29 — End: 2018-06-03

## 2017-06-12 ENCOUNTER — Ambulatory Visit: Admit: 2017-06-12 | Discharge: 2017-06-13 | Payer: PRIVATE HEALTH INSURANCE

## 2017-06-12 DIAGNOSIS — M25572 Pain in left ankle and joints of left foot: Principal | ICD-10-CM

## 2017-06-19 ENCOUNTER — Ambulatory Visit: Admit: 2017-06-19 | Discharge: 2017-06-20 | Payer: PRIVATE HEALTH INSURANCE

## 2017-06-19 DIAGNOSIS — K7469 Other cirrhosis of liver: Principal | ICD-10-CM

## 2017-06-26 ENCOUNTER — Ambulatory Visit
Admit: 2017-06-26 | Discharge: 2017-06-27 | Payer: PRIVATE HEALTH INSURANCE | Attending: Foot and Ankle Surgery | Primary: Foot and Ankle Surgery

## 2017-06-26 DIAGNOSIS — M25572 Pain in left ankle and joints of left foot: Principal | ICD-10-CM

## 2017-06-29 ENCOUNTER — Institutional Professional Consult (permissible substitution): Admit: 2017-06-29 | Discharge: 2017-06-30 | Payer: PRIVATE HEALTH INSURANCE

## 2017-06-29 MED ORDER — HYDROXYCHLOROQUINE 200 MG TABLET
ORAL_TABLET | Freq: Two times a day (BID) | ORAL | 1 refills | 0 days | Status: CP
Start: 2017-06-29 — End: 2017-08-06

## 2017-07-09 MED ORDER — CONTRAVE 8 MG-90 MG TABLET,EXTENDED RELEASE
ORAL_TABLET | 1 refills | 0 days | Status: CP
Start: 2017-07-09 — End: 2017-11-08

## 2017-08-06 ENCOUNTER — Ambulatory Visit
Admit: 2017-08-06 | Discharge: 2017-08-06 | Payer: PRIVATE HEALTH INSURANCE | Attending: Rheumatology | Primary: Rheumatology

## 2017-08-06 DIAGNOSIS — M329 Systemic lupus erythematosus, unspecified: Secondary | ICD-10-CM

## 2017-08-06 DIAGNOSIS — M3500 Sicca syndrome, unspecified: Principal | ICD-10-CM

## 2017-08-06 MED ORDER — HYDROXYCHLOROQUINE 200 MG TABLET
ORAL_TABLET | Freq: Two times a day (BID) | ORAL | 1 refills | 0.00000 days | Status: CP
Start: 2017-08-06 — End: 2017-10-23

## 2017-08-13 ENCOUNTER — Ambulatory Visit: Admit: 2017-08-13 | Discharge: 2017-08-14 | Payer: PRIVATE HEALTH INSURANCE

## 2017-08-13 ENCOUNTER — Ambulatory Visit
Admit: 2017-08-13 | Discharge: 2017-08-14 | Payer: PRIVATE HEALTH INSURANCE | Attending: Foot and Ankle Surgery | Primary: Foot and Ankle Surgery

## 2017-08-13 DIAGNOSIS — M329 Systemic lupus erythematosus, unspecified: Secondary | ICD-10-CM

## 2017-08-13 DIAGNOSIS — Z79899 Other long term (current) drug therapy: Principal | ICD-10-CM

## 2017-08-13 DIAGNOSIS — L930 Discoid lupus erythematosus: Secondary | ICD-10-CM

## 2017-08-13 DIAGNOSIS — M3500 Sicca syndrome, unspecified: Secondary | ICD-10-CM

## 2017-10-23 MED ORDER — HYDROXYCHLOROQUINE 200 MG TABLET
ORAL_TABLET | Freq: Two times a day (BID) | ORAL | 1 refills | 0 days | Status: CP
Start: 2017-10-23 — End: 2017-12-31

## 2017-11-07 MED ORDER — FAMOTIDINE 40 MG TABLET
ORAL_TABLET | 1 refills | 0 days | Status: CP
Start: 2017-11-07 — End: 2018-05-02

## 2017-11-08 ENCOUNTER — Ambulatory Visit
Admit: 2017-11-08 | Discharge: 2017-11-08 | Payer: PRIVATE HEALTH INSURANCE | Attending: Rheumatology | Primary: Rheumatology

## 2017-11-08 DIAGNOSIS — G5602 Carpal tunnel syndrome, left upper limb: Principal | ICD-10-CM

## 2017-11-28 ENCOUNTER — Ambulatory Visit
Admit: 2017-11-28 | Discharge: 2017-11-29 | Payer: PRIVATE HEALTH INSURANCE | Attending: Family Medicine | Primary: Family Medicine

## 2017-11-28 DIAGNOSIS — K7460 Unspecified cirrhosis of liver: Secondary | ICD-10-CM

## 2017-11-28 DIAGNOSIS — K7581 Nonalcoholic steatohepatitis (NASH): Secondary | ICD-10-CM

## 2017-11-28 DIAGNOSIS — Z23 Encounter for immunization: Secondary | ICD-10-CM

## 2017-11-28 DIAGNOSIS — Z0000 Encounter for general adult medical examination without abnormal findings: Principal | ICD-10-CM

## 2017-12-10 ENCOUNTER — Ambulatory Visit: Admit: 2017-12-10 | Discharge: 2017-12-11 | Payer: PRIVATE HEALTH INSURANCE

## 2017-12-10 DIAGNOSIS — K7469 Other cirrhosis of liver: Principal | ICD-10-CM

## 2017-12-31 ENCOUNTER — Ambulatory Visit
Admit: 2017-12-31 | Discharge: 2018-01-01 | Payer: PRIVATE HEALTH INSURANCE | Attending: Rheumatology | Primary: Rheumatology

## 2017-12-31 ENCOUNTER — Ambulatory Visit: Admit: 2017-12-31 | Discharge: 2018-01-01 | Payer: PRIVATE HEALTH INSURANCE

## 2017-12-31 DIAGNOSIS — M1990 Unspecified osteoarthritis, unspecified site: Principal | ICD-10-CM

## 2017-12-31 DIAGNOSIS — M329 Systemic lupus erythematosus, unspecified: Principal | ICD-10-CM

## 2017-12-31 DIAGNOSIS — M3500 Sicca syndrome, unspecified: Secondary | ICD-10-CM

## 2017-12-31 MED ORDER — PREDNISONE 10 MG TABLET
ORAL_TABLET | 0 refills | 0 days | Status: CP
Start: 2017-12-31 — End: 2018-06-03

## 2017-12-31 MED ORDER — HYDROXYCHLOROQUINE 200 MG TABLET
ORAL_TABLET | Freq: Two times a day (BID) | ORAL | 1 refills | 0 days | Status: CP
Start: 2017-12-31 — End: 2018-06-03

## 2018-05-02 MED ORDER — FAMOTIDINE 40 MG TABLET
ORAL_TABLET | 3 refills | 0 days | Status: CP
Start: 2018-05-02 — End: ?

## 2018-06-03 ENCOUNTER — Ambulatory Visit
Admit: 2018-06-03 | Discharge: 2018-06-04 | Payer: PRIVATE HEALTH INSURANCE | Attending: Family Medicine | Primary: Family Medicine

## 2018-06-03 DIAGNOSIS — M3500 Sicca syndrome, unspecified: Principal | ICD-10-CM

## 2018-06-03 DIAGNOSIS — K7460 Unspecified cirrhosis of liver: Secondary | ICD-10-CM

## 2018-06-03 DIAGNOSIS — F331 Major depressive disorder, recurrent, moderate: Secondary | ICD-10-CM

## 2018-06-03 DIAGNOSIS — D696 Thrombocytopenia, unspecified: Secondary | ICD-10-CM

## 2018-06-03 DIAGNOSIS — K7581 Nonalcoholic steatohepatitis (NASH): Secondary | ICD-10-CM

## 2018-06-03 MED ORDER — HYDROXYCHLOROQUINE 200 MG TABLET
ORAL_TABLET | Freq: Two times a day (BID) | ORAL | 1 refills | 0.00000 days | Status: CP
Start: 2018-06-03 — End: 2018-10-24

## 2018-06-17 ENCOUNTER — Ambulatory Visit: Admit: 2018-06-17 | Discharge: 2018-06-17 | Payer: PRIVATE HEALTH INSURANCE

## 2018-06-17 DIAGNOSIS — K7581 Nonalcoholic steatohepatitis (NASH): Principal | ICD-10-CM

## 2018-06-17 DIAGNOSIS — K7460 Unspecified cirrhosis of liver: Secondary | ICD-10-CM

## 2018-07-15 MED ORDER — CONTRAVE 8 MG-90 MG TABLET,EXTENDED RELEASE
ORAL_TABLET | 0 refills | 0 days | Status: CP
Start: 2018-07-15 — End: ?

## 2018-07-22 ENCOUNTER — Ambulatory Visit
Admit: 2018-07-22 | Discharge: 2018-07-23 | Payer: PRIVATE HEALTH INSURANCE | Attending: Rheumatology | Primary: Rheumatology

## 2018-07-22 DIAGNOSIS — M797 Fibromyalgia: Principal | ICD-10-CM

## 2018-07-22 DIAGNOSIS — K7460 Unspecified cirrhosis of liver: Principal | ICD-10-CM

## 2018-07-22 DIAGNOSIS — M329 Systemic lupus erythematosus, unspecified: Principal | ICD-10-CM

## 2018-07-22 DIAGNOSIS — M5126 Other intervertebral disc displacement, lumbar region: Principal | ICD-10-CM

## 2018-07-22 DIAGNOSIS — Z789 Other specified health status: Principal | ICD-10-CM

## 2018-07-22 DIAGNOSIS — K7581 Nonalcoholic steatohepatitis (NASH): Principal | ICD-10-CM

## 2018-07-22 DIAGNOSIS — I38 Endocarditis, valve unspecified: Principal | ICD-10-CM

## 2018-07-22 DIAGNOSIS — M3500 Sicca syndrome, unspecified: Principal | ICD-10-CM

## 2018-07-22 MED ORDER — BELIMUMAB 200 MG/ML SUBCUTANEOUS AUTO-INJECTOR
INJECTION | SUBCUTANEOUS | 0 refills | 0.00000 days | Status: CP
Start: 2018-07-22 — End: 2018-10-24

## 2018-07-22 MED ORDER — PREDNISONE 5 MG TABLET
0 refills | 0 days | Status: CP
Start: 2018-07-22 — End: 2018-11-15

## 2018-07-31 DIAGNOSIS — R609 Edema, unspecified: Principal | ICD-10-CM

## 2018-07-31 MED ORDER — AMOXICILLIN 875 MG-POTASSIUM CLAVULANATE 125 MG TABLET
ORAL_TABLET | Freq: Two times a day (BID) | ORAL | 0 refills | 0.00000 days | Status: CP
Start: 2018-07-31 — End: 2018-08-10

## 2018-08-03 DIAGNOSIS — M797 Fibromyalgia: Principal | ICD-10-CM

## 2018-08-03 DIAGNOSIS — K7581 Nonalcoholic steatohepatitis (NASH): Principal | ICD-10-CM

## 2018-08-03 DIAGNOSIS — M5126 Other intervertebral disc displacement, lumbar region: Principal | ICD-10-CM

## 2018-08-03 DIAGNOSIS — I38 Endocarditis, valve unspecified: Principal | ICD-10-CM

## 2018-08-03 DIAGNOSIS — M3500 Sicca syndrome, unspecified: Principal | ICD-10-CM

## 2018-08-03 DIAGNOSIS — M329 Systemic lupus erythematosus, unspecified: Principal | ICD-10-CM

## 2018-08-03 DIAGNOSIS — Z789 Other specified health status: Principal | ICD-10-CM

## 2018-08-03 DIAGNOSIS — K7460 Unspecified cirrhosis of liver: Principal | ICD-10-CM

## 2018-08-06 DIAGNOSIS — M329 Systemic lupus erythematosus, unspecified: Principal | ICD-10-CM

## 2018-09-27 ENCOUNTER — Ambulatory Visit
Admit: 2018-09-27 | Discharge: 2018-09-28 | Payer: PRIVATE HEALTH INSURANCE | Attending: Otolaryngology | Primary: Otolaryngology

## 2018-09-27 DIAGNOSIS — R0981 Nasal congestion: Secondary | ICD-10-CM

## 2018-09-27 DIAGNOSIS — R609 Edema, unspecified: Principal | ICD-10-CM

## 2018-09-27 DIAGNOSIS — M329 Systemic lupus erythematosus, unspecified: Secondary | ICD-10-CM

## 2018-10-24 ENCOUNTER — Ambulatory Visit
Admit: 2018-10-24 | Discharge: 2018-10-25 | Payer: PRIVATE HEALTH INSURANCE | Attending: Rheumatology | Primary: Rheumatology

## 2018-10-24 DIAGNOSIS — G5602 Carpal tunnel syndrome, left upper limb: Secondary | ICD-10-CM

## 2018-10-24 DIAGNOSIS — M329 Systemic lupus erythematosus, unspecified: Principal | ICD-10-CM

## 2018-10-24 MED ORDER — HYDROXYCHLOROQUINE 200 MG TABLET
ORAL_TABLET | Freq: Two times a day (BID) | ORAL | 1 refills | 0 days | Status: CP
Start: 2018-10-24 — End: ?

## 2018-10-24 MED ORDER — BELIMUMAB 200 MG/ML SUBCUTANEOUS AUTO-INJECTOR
SUBCUTANEOUS | 0 refills | 0.00000 days | Status: CP
Start: 2018-10-24 — End: ?
  Filled 2019-01-09: qty 4, 28d supply, fill #0

## 2018-10-24 MED ORDER — EMPTY CONTAINER
PRN refills | 0 days
Start: 2018-10-24 — End: ?

## 2018-10-25 NOTE — Unmapped (Signed)
Per test claim for [facility] at the [facility], patient needs Medication Assistance Program for High Copay OF [phone].

## 2018-10-28 NOTE — Unmapped (Signed)
Otolaryngology New Consultation Visit Note      Reason for Visit:  Bilateral parotid swelling.     History of Present Illness    Tonya Brady is 43 y.o. female being seen in consultation at the request of Dr. Roma Schanz  for evaluation of bilateral parotid swelling. Pain is worse on the left but swelling worse on the right. She was initially thought to have Sjogren's based on parotid enlargement and serologies (+ANA, Anti-SSA, anti-SSB, RF)??back in 2005; however developed a malar rash in 2011 and dx changed to SLE. The patient most recently presented to rheumatology on 07/22/18 with complaints of increased jaw pain, new rash, and arthralgias. She was recommended Prednisone and then Augmentin. She has seen an ENT in the past (2006 and 2010) for parotid pain and sinus infections. Had imaging of her parotid gland probably 8 years ago. Dr. Roma Schanz did [place] in clinic.   ??  At the time of the original consult she complained of severe right parotid swelling with an additional area just anterior to this.  There was no resolution with steroids or antibiotics, but slowly improved on its own.  In addition, she states she has been sick for 3 months with upper respiratory symptoms with no relief from antibiotics or any medications. She endorses recent improvement in her parotid swelling. The patient reports her saliva is thick today although she is well hydrated and she uses a nasal steroid spray intermittently.  She has no facial weakness.  She has no facial numbness.  She denies any particularly foul taste in her mouth.  She denies any other head and neck issues.  ??  No issues with bleeding or with GA. No heart or lung issues.     The patient denies fevers, chills, shortness of breath, chest pain, nausea, vomiting, diarrhea, inability to lie flat, dysphagia, odynophagia, hemoptysis, hematemesis, changes in vision, changes in voice quality, otalgia, otorrhea, vertiginous symptoms, focal deficits, or other concerning symptoms.    Past Medical History     has a past medical history of Bulging lumbar disc (2005), Cirrhosis of liver (CMS-HCC), Difficult intravenous access, Fibromyalgia (03/09/2013), NASH (nonalcoholic steatohepatitis), Secondary Sjogren's syndrome (CMS-HCC) (03/09/2013), Systemic lupus erythematosus (CMS-HCC) (01/19/2011), and Valvular regurgitation.    Past Surgical History     has a past surgical history that includes Total abdominal hysterectomy; Hernia repair; Cesarean section; Tubal ligation; Cholecystectomy; Carpal tunnel release; and pr upper gi endoscopy,diagnosis (N/A, 05/10/2016).    Current Medications    Current Outpatient Medications   Medication Sig Dispense Refill   ??? belimumab 200 mg/mL AtIn Inject the contents of 1 syringe (200 mg) under the skin every seven (7) days. 4 mL 0   ??? CONTRAVE 8-90 mg TbER TAKE 2 TABLETS BY MOUTH TWICE DAILY 240 tablet 0   ??? famotidine (PEPCID) 40 MG tablet TAKE 1 TABLET(40 MG) BY MOUTH EVERY NIGHT 90 tablet 3   ??? hydroxychloroquine (PLAQUENIL) 200 mg tablet Take 1 tablet (200 mg total) by mouth Two (2) times a day. 60 tablet 1   ??? ibuprofen (ADVIL,MOTRIN) 400 MG tablet Take 1 tablet (400 mg total) by mouth every six (6) hours as needed for pain. 30 tablet 5   ??? predniSONE (DELTASONE) 5 MG tablet Please start 20 mg for 4 days, 15 for 4 days, 10 for 4 days and 5 for 4 days and stop 40 each 0     No current facility-administered medications for this visit.        Allergies  Allergies   Allergen Reactions   ??? Aspirin Swelling     Other reaction(s): SWELLING/EDEMA   ??? Fish Containing Products Diarrhea and Nausea And Vomiting   ??? Iodine Other (See Comments)     Pt told not to have d/t shellfish allergy   ??? Shellfish Containing Products Hives, Diarrhea and Nausea Only     Other reaction(s): HIVES  Other reaction(s): HIVES  Other reaction(s): NAUSEA       Family History    family history includes Aneurysm in her maternal aunt; Cancer in her mother; Depression in her son; Heart disease in her father; Hypertension in her mother; Mental illness in her son.    Social History:     reports that she has never smoked. She has never used smokeless tobacco.   reports no history of alcohol use.   reports no history of drug use.    Review of Systems    A full 12-system review of systems is documented and negative except as noted in HPI.  Patient intake form reviewed.    Vital Signs  Height 154.9 cm (5' 0.98), weight 90.7 kg (200 lb), not currently breastfeeding.    Physical Exam    General: Well-developed, well-nourished. Appropriate, comfortable, and in no apparent distress.  Voice: clear   Head/Face: On external examination there is no obvious asymmetry or scars. On palpation there is no masses within the salivary glands. Right parotid gland is firm. Cranial nerves V and VII are intact through all distributions.  Eyes: PERRL, EOMI, the conjunctiva are not injected and sclera is non-icteric.  Ears:   Right ear: On external exam, there is no obvious lesions or asymmetry. No cerumen. No lesions in the EAC. The TM is in the neutral position and mobile to pneumatic otoscopy. No middle ear masses or fluid noted.   Hearing is grossly intact.  Left ear: On external exam, there is no obvious lesions or asymmetry. No cerumen. No lesions in the EAC. The TM is in the neutral position and mobile to pneumatic otoscopy. No middle ear masses or fluid noted.   Hearing is grossly intact.  Nose: No external masses, lesions, or deformity. Anterior rhinoscopy of the nasal mucosa, septum, and turbinates reveals a normal exam.   Oral cavity/oropharynx: The mucosa of the lips, gums, hard and soft palate, posterior pharyngeal wall, tongue, floor of mouth, and buccal region are without masses or lesions and are normally hydrated. Good dentition. Tongue protrudes midline. Tonsils are symmetric without any masses or lesions. Supraglottis not visualized due to gag reflex. Able to get some saliva out of the parotid glands.   Neck: There is no asymmetry or masses. Trachea is midline. There is no enlargement of the thyroid or palpable thyroid nodules.   Lymphatics: There is no palpable lymphadenopathy along the jugulodiagastric, submental, or posterior cervical chains.  Chest: No audible wheeze, unlabored respirations.  Neurologic: Cranial nerve???s II-XII are grossly intact. Exam is non-focal.  Extremities: No cyanosis, clubbing or edema.      Outside Medical Records  I personally reviewed patient's medical records from the referring provider.     Assessment/Recommendations:    The patient is a 43 y.o. female with a history of  has a past medical history of Bulging lumbar disc (2005), Cirrhosis of liver (CMS-HCC), Difficult intravenous access, Fibromyalgia (03/09/2013), NASH (nonalcoholic steatohepatitis), Secondary Sjogren's syndrome (CMS-HCC) (03/09/2013), Systemic lupus erythematosus (CMS-HCC) (01/19/2011), and Valvular regurgitation. who presents for the evaluation of bilateral parotid swelling. Likely from autoimmune issues. Will  get CT to make sure there aren't any stones given history of stones in past but likely ductal narrowing from autoimmune issues. Discussed sialendoscopy with patient - discussed that evaluation of the ducts, removal of any stones with steroid injections can sometimes help. Discussed that for ductal narrowing unlikely to resolve the issues but may decrease the number of swelling events she has. Discussed usually start with just one side.  Discussed the risk, benefits and alternatives including but not limited to the risks of bleeding, pain, swelling, infection, dry mouth, numbness of the tongue, duct stenosis, gland or duct perforation or return of symptoms. Discussed that patients often have worse swelling after surgery for a week. Patient wishes to proceed with surgery. Will likely start with right side given firmness of that side but will see what CT reveals. Patient to call to schedule surgery.         The patient voiced complete understanding of plan as detailed above and is in full agreement.

## 2018-10-28 NOTE — Unmapped (Addendum)
Otolaryngology New Consultation Visit Note      Reason for Visit:  Bilateral parotid swelling.     History of Present Illness    Tonya Brady is 43 y.o. female being seen in consultation at the request of Dr. Roma Schanz  for evaluation of bilateral parotid swelling. Pain is worse on the left but swelling worse on the right. She was initially thought to have Sjogren's based on parotid enlargement and serologies (+ANA, Anti-SSA, anti-SSB, RF)??back in 2005; however developed a malar rash in 2011 and dx changed to SLE. The patient most recently presented to rheumatology on 07/22/18 with complaints of increased jaw pain, new rash, and arthralgias. She was recommended Prednisone and then Augmentin. She has seen an ENT in the past (2006 and 2010) for parotid pain and sinus infections. Had imaging of her parotid gland probably 8 years ago. Dr. Roma Schanz did [place] in clinic.   ??  At the time of the original consult she complained of severe right parotid swelling with an additional area just anterior to this.  There was no resolution with steroids or antibiotics, but slowly improved on its own.  In addition, she states she has been sick for 3 months with upper respiratory symptoms with no relief from antibiotics or any medications. She endorses recent improvement in her parotid swelling. The patient reports her saliva is thick today although she is well hydrated and she uses a nasal steroid spray intermittently.  She has no facial weakness.  She has no facial numbness.  She denies any particularly foul taste in her mouth.  She denies any other head and neck issues.  ??  No issues with bleeding or with GA. No heart or lung issues.     The patient denies fevers, chills, shortness of breath, chest pain, nausea, vomiting, diarrhea, inability to lie flat, dysphagia, odynophagia, hemoptysis, hematemesis, changes in vision, changes in voice quality, otalgia, otorrhea, vertiginous symptoms, focal deficits, or other concerning symptoms.    Past Medical History     has a past medical history of Bulging lumbar disc (2005), Cirrhosis of liver (CMS-HCC), Difficult intravenous access, Fibromyalgia (03/09/2013), NASH (nonalcoholic steatohepatitis), Secondary Sjogren's syndrome (CMS-HCC) (03/09/2013), Systemic lupus erythematosus (CMS-HCC) (01/19/2011), and Valvular regurgitation.    Past Surgical History     has a past surgical history that includes Total abdominal hysterectomy; Hernia repair; Cesarean section; Tubal ligation; Cholecystectomy; Carpal tunnel release; and pr upper gi endoscopy,diagnosis (N/A, 05/10/2016).    Current Medications    Current Outpatient Medications   Medication Sig Dispense Refill   ??? belimumab 200 mg/mL AtIn Inject the contents of 1 syringe (200 mg) under the skin every seven (7) days. 4 mL 0   ??? CONTRAVE 8-90 mg TbER TAKE 2 TABLETS BY MOUTH TWICE DAILY 240 tablet 0   ??? famotidine (PEPCID) 40 MG tablet TAKE 1 TABLET(40 MG) BY MOUTH EVERY NIGHT 90 tablet 3   ??? hydroxychloroquine (PLAQUENIL) 200 mg tablet Take 1 tablet (200 mg total) by mouth Two (2) times a day. 60 tablet 1   ??? ibuprofen (ADVIL,MOTRIN) 400 MG tablet Take 1 tablet (400 mg total) by mouth every six (6) hours as needed for pain. 30 tablet 5   ??? predniSONE (DELTASONE) 5 MG tablet Please start 20 mg for 4 days, 15 for 4 days, 10 for 4 days and 5 for 4 days and stop 40 each 0     No current facility-administered medications for this visit.        Allergies  Allergies   Allergen Reactions   ??? Aspirin Swelling     Other reaction(s): SWELLING/EDEMA   ??? Fish Containing Products Diarrhea and Nausea And Vomiting   ??? Iodine Other (See Comments)     Pt told not to have d/t shellfish allergy   ??? Shellfish Containing Products Hives, Diarrhea and Nausea Only     Other reaction(s): HIVES  Other reaction(s): HIVES  Other reaction(s): NAUSEA       Family History    family history includes Aneurysm in her maternal aunt; Cancer in her mother; Depression in her son; Heart disease in her father; Hypertension in her mother; Mental illness in her son.    Social History:     reports that she has never smoked. She has never used smokeless tobacco.   reports no history of alcohol use.   reports no history of drug use.    Review of Systems    A full 12-system review of systems is documented and negative except as noted in HPI.  Patient intake form reviewed.    Vital Signs  Height 154.9 cm (5' 0.98), weight 90.7 kg (200 lb), not currently breastfeeding.    Physical Exam    General: Well-developed, well-nourished. Appropriate, comfortable, and in no apparent distress.  Voice: clear   Head/Face: On external examination there is no obvious asymmetry or scars. On palpation there is no masses within the salivary glands. Right parotid gland is firm. Cranial nerves V and VII are intact through all distributions.  Eyes: PERRL, EOMI, the conjunctiva are not injected and sclera is non-icteric.  Ears:   Right ear: On external exam, there is no obvious lesions or asymmetry. No cerumen. No lesions in the EAC. The TM is in the neutral position and mobile to pneumatic otoscopy. No middle ear masses or fluid noted.   Hearing is grossly intact.  Left ear: On external exam, there is no obvious lesions or asymmetry. No cerumen. No lesions in the EAC. The TM is in the neutral position and mobile to pneumatic otoscopy. No middle ear masses or fluid noted.   Hearing is grossly intact.  Nose: No external masses, lesions, or deformity. Anterior rhinoscopy of the nasal mucosa, septum, and turbinates reveals a normal exam.   Oral cavity/oropharynx: The mucosa of the lips, gums, hard and soft palate, posterior pharyngeal wall, tongue, floor of mouth, and buccal region are without masses or lesions and are normally hydrated. Good dentition. Tongue protrudes midline. Tonsils are symmetric without any masses or lesions. Supraglottis not visualized due to gag reflex. Able to get some saliva out of the parotid glands.   Neck: There is no asymmetry or masses. Trachea is midline. There is no enlargement of the thyroid or palpable thyroid nodules.   Lymphatics: There is no palpable lymphadenopathy along the jugulodiagastric, submental, or posterior cervical chains.  Chest: No audible wheeze, unlabored respirations.  Neurologic: Cranial nerve???s II-XII are grossly intact. Exam is non-focal.  Extremities: No cyanosis, clubbing or edema.      Outside Medical Records  I personally reviewed patient's medical records from the referring provider.     Assessment/Recommendations:    The patient is a 43 y.o. female with a history of  has a past medical history of Bulging lumbar disc (2005), Cirrhosis of liver (CMS-HCC), Difficult intravenous access, Fibromyalgia (03/09/2013), NASH (nonalcoholic steatohepatitis), Secondary Sjogren's syndrome (CMS-HCC) (03/09/2013), Systemic lupus erythematosus (CMS-HCC) (01/19/2011), and Valvular regurgitation. who presents for the evaluation of bilateral parotid swelling. Likely from autoimmune issues. Will  get CT to make sure there aren't any stones given history of stones in past but likely ductal narrowing from autoimmune issues. Discussed sialendoscopy with patient - discussed that evaluation of the ducts, removal of any stones with steroid injections can sometimes help. Discussed that for ductal narrowing unlikely to resolve the issues but may decrease the number of swelling events she has. Discussed usually start with just one side.  Discussed the risk, benefits and alternatives including but not limited to the risks of bleeding, pain, swelling, infection, dry mouth, numbness of the tongue, duct stenosis, gland or duct perforation or return of symptoms. Discussed that patients often have worse swelling after surgery for a week. Patient wishes to proceed with surgery. Will likely start with right side given firmness of that side but will see what CT reveals. Patient to call to schedule surgery.         The patient voiced complete understanding of plan as detailed above and is in full agreement.

## 2018-10-28 NOTE — Unmapped (Signed)
[facility] Medication Referral: PATIENT MUST CALL FOR MFG ASSISTANCE      Medication (Brand/Generic): BENLYSTA    Final Test Claim completed with resulted information below:    Patient ABLE to fill at [facility]:  [facility]  Anticipated Copay: $9604.54  Is anticipated copay with a copay card or grant? No, the patient/caregiver has to call and get the copay assistance for the medication at Patient needs to either call [phone] or visit  [email or web address]  To enroll in co-pay card program. Patient has been through the process before check [phone]. I will send the patient a My Chart Message and try calling her.     Does this patient have to receive a partial fill of the medication due to insurance restrictions? NO  If so, please cofirm how many days supply is allowed per plan per fill and how long the patient will have to fill partial months supply for the medication: NOT APPLICABLE     If the copay is under the $25 defined limit, per policy there will be no further investigation of need for financial assistance at this time unless patient requests. This referral has been communicated to the provider and handed off to the [facility] team for further processing and filling of prescribed medication.   ______________________________________________________________________  Please utilize this referral for viewing purposes as it will serve as the central location for all relevant documentation and updates.

## 2018-10-29 NOTE — Unmapped (Signed)
[facility]   Patient Onboarding/Medication Counseling    Tonya Brady is a 43 y.o. female with SLE who I am counseling today on initiation of therapy.  I am speaking to the patient.    Verified patient's date of birth / HIPAA.    Specialty medication(s) to be sent: Inflammatory Disorders: Benlysta      Non-specialty medications/supplies to be sent: sharps kit         Benlysta?? (belimumab)    Medication & Administration     Dosage: Systemic lupus erythematosus: Inject 200mg  under the skin one time weekly    Administration:     Prefilled auto-injector pen  1. Gather all supplies needed for injection on a clean, flat working surface: medication pen removed from packaging, alcohol swab, sharps container, etc.  2. Look at the medication label ??? look for correct medication, correct dose, and check the expiration date  3. Look at the medication ??? the liquid visible in the window on the side of the pen device should appear clear and colorless to slightly yellow  4. Lay the auto-injector pen on a flat surface and allow it to warm up to room temperature for at least 30 minutes  5. Select injection site ??? you can use the front of your thigh or your belly (but not the area 2 inches around your belly button)  6. Prepare injection site ??? wash your hands and clean the skin at the injection site with an alcohol swab and let it air dry, do not touch the injection site again before the injection  7. Pull/twist off the ring safety cap, do not remove until immediately prior to injection and do not touch the needle shield  8. Pinch the skin ??? with your hand not holding the auto-injector pinch up a fold of skin at the injection site using your forefinger and thumb to prepare a firm injection site  9. Put the gold needle shield against your skin at the injection site at a 90 degree angle, hold the pen such that you can see the clear medication window  10. To initiate the injection firmly press the autoinjector all the way down onto the injection site and hold in place ??? you will hear a click sound to indicate the injection has started  11. Continue to hold the auto injector firmly against your skin for 15 seconds ??? you will see the purple injection indicator start to fill in the viewing window  12. There will be a second click sound when the injection is almost complete, verify the window is solid purple before pulling the pen away from your skin  13. Dispose of the used auto-injector pen immediately in your sharps disposal container the needle will be covered automatically  14. If you see any blood at the injection site, press a cotton ball or gauze on the site and maintain pressure until the bleeding stops, do not rub the injection site      Adherence/Missed dose instructions:  Take your missed dose as soon as you remember it.  After taking a missed dose, start a new weekly schedule based on the day the dose is taken ??? consult your pharmacist for additional instructions on how to adjust your dosing schedule if you have questions.    Goals of Therapy     ? Ensure long-term survival  ? Achieve the lowest possible disease activity  ? Prevent organ damage  ? Improve quality of life  ? Minimize drug toxicity  Side Effects & Monitoring Parameters     ? Injection site reaction (redness, irritation, inflammation localized to the site of administration)  ? Signs of a common cold ??? minor sore throat, runny or stuffy nose, etc.  ? Headache  ? Diarrhea      The following side effects should be reported to the provider:  ? Signs of a hypersensitivity reaction ??? rash; hives; itching; red, swollen, blistered, or peeling skin; wheezing; tightness in the chest or throat; difficulty breathing, swallowing, or talking; swelling of the mouth, face, lips, tongue, or throat; etc.  ? Reduced immune function ??? report signs of infection such as fever; chills; body aches; very bad sore throat; ear or sinus pain; cough; more sputum or change in color of sputum; pain with passing urine; wound that will not heal, etc.  Also at a slightly higher risk of some malignancies (mainly skin and blood cancers) due to this reduced immune function.  o In the case of signs of infection ??? the patient should hold the next dose of Benlysta?? and call your primary care provider to ensure adequate medical care.  Treatment may be resumed when infection is treated and patient is asymptomatic.  ? New or worse behavior or mood changes such as depression or suicidality  ? Chest pain or pressure  ? Shortness of breath  ? Signs of PML ??? confusion, memory problems, trouble speaking or thinking, change in balance, etc.      Warnings, Precautions, & Contraindications     ? Have your bloodwork checked as you have been told by your prescriber   ? Talk with your doctor if you are pregnant, planning to become pregnant, or breastfeeding  ? Discuss the possible need for holding your dose(s) of Benlysta?? when a planned procedure is scheduled with the prescriber as it may delay healing/recovery timeline       Drug/Food Interactions     ? Medication list reviewed in Epic. The patient was instructed to inform the care team before taking any new medications or supplements. No drug interactions identified.   ? Talk with you prescriber or pharmacist before receiving any live vaccinations while taking this medication and after you stop taking it    Storage, Handling Precautions, & Disposal     ? Store this medication in the refrigerator.  Do not freeze  ? If needed, you may store at room temperature for up to 12 hours  ? Store in [employer], protected from light  ? Do not shake  ? Dispose of used syringes/pens in a sharps disposal container    Current Medications (including OTC/herbals), Comorbidities and Allergies     Current Outpatient Medications   Medication Sig Dispense Refill   ??? acetaminophen (TYLENOL) 500 MG tablet Take 1,000 mg by mouth.     ??? belimumab 200 mg/mL AtIn Inject the contents of 1 syringe (200 mg) under the skin every seven (7) days. (Patient not taking: Reported on 11/28/2018) 4 mL 0   ??? CONTRAVE 8-90 mg TbER TAKE 2 TABLETS BY MOUTH TWICE DAILY 240 tablet 0   ??? famotidine (PEPCID) 40 MG tablet TAKE 1 TABLET(40 MG) BY MOUTH EVERY NIGHT 90 tablet 3   ??? hydroxychloroquine (PLAQUENIL) 200 mg tablet Take 1 tablet (200 mg total) by mouth Two (2) times a day. 60 tablet 1   ??? ibuprofen (ADVIL,MOTRIN) 400 MG tablet Take 1 tablet (400 mg total) by mouth every six (6) hours as needed for pain. 30 tablet 5   ???  methylPREDNISolone (MEDROL DOSEPACK) 4 mg tablet Follow package directions inside box on the back of tablet insert (Patient not taking: Reported on 12/30/2018) 21 tablet 0     No current facility-administered medications for this visit.        Allergies   Allergen Reactions   ??? Aspirin Swelling     Other reaction(s): SWELLING/EDEMA   ??? Fish Containing Products Diarrhea and Nausea And Vomiting   ??? Iodine Other (See Comments)     Pt told not to have d/t shellfish allergy   ??? Shellfish Containing Products Hives, Diarrhea and Nausea Only     Other reaction(s): HIVES  Other reaction(s): HIVES  Other reaction(s): NAUSEA       Patient Active Problem List   Diagnosis   ??? Systemic lupus erythematosus (CMS-HCC)   ??? Allergic rhinitis   ??? Secondary Sjogren's syndrome (CMS-HCC)   ??? Fibromyalgia   ??? Restless leg syndrome   ??? Thrombocytopenia (CMS-HCC)   ??? Palpitations   ??? Morbid obesity due to excess calories (CMS-HCC)   ??? Chronic abdominal pain   ??? 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 Moderate episode of recurrent major depressive disorder (CMS-HCC)   ??? Gastroesophageal reflux disease   ??? GAD (generalized anxiety disorder)   ??? Acute left ankle pain   ??? Acute carpal tunnel syndrome of left wrist   ??? Annual physical exam   ??? Acquired trigger finger   ??? Swelling of right parotid gland   ??? Swelling of left parotid gland       Reviewed and up to date in Epic. Appropriateness of Therapy     Is medication and dose appropriate based on diagnosis? Yes    Baseline Quality of Life Assessment      How many days over the past month did your lupus keep you from your normal activities? 0    Financial Information     Medication Assistance provided: Prior Authorization    Anticipated copay of $0.00 reviewed with patient. Verified delivery address.    Delivery Information     Scheduled delivery date: 01/09/2019    Expected start date: 01/09/2019    Medication will be delivered via Same Day Courier to the home address in [place].  This shipment will not require a signature.      Explained the services we provide at [facility] and that each month we would call to set up refills.  Stressed importance of returning phone calls so that we could ensure they receive their medications in time each month.  Informed patient that we should be setting up refills 7-10 days prior to when they will run out of medication.  A pharmacist will reach out to perform a clinical assessment periodically.  Informed patient that a welcome packet and a drug information handout will be sent.      Patient verbalized understanding of the above information as well as how to contact the pharmacy at [phone] option 4 with any questions/concerns.  The pharmacy is open Monday through Friday 8:30am-4:30pm.  A pharmacist is available 24/7 via pager to answer any clinical questions they may have.    Patient Specific Needs     ? Does the patient have any physical, cognitive, or cultural barriers? No    ? Patient prefers to have medications discussed with  Patient     ? Is the patient able to read and understand education materials at a high school level or above?  Yes    ? Patient's primary language is  English     ? Is the patient high risk? No     ? Does the patient require a Care Management Plan? No     ? Does the patient require physician intervention or other additional services (i.e. nutrition, smoking cessation, social work)? No      Karene Fry Ademide Schaberg  [facility] Specialty Pharmacist

## 2018-11-11 ENCOUNTER — Ambulatory Visit: Admit: 2018-11-11 | Discharge: 2018-11-12 | Payer: PRIVATE HEALTH INSURANCE

## 2018-11-11 DIAGNOSIS — R609 Edema, unspecified: Principal | ICD-10-CM

## 2018-11-11 NOTE — Unmapped (Addendum)
Sialendoscopy Procedure  When salivary gland stones become lodged in our saliva ducts or within a gland, removal of the stone may be necessary. There could also be a duct narrowing that can be opened. With the use of a new, minimally invasive technique known as a sialendoscopy, surgeons can now remove salivary stones or narrowing as an outpatient procedure, enabling the patient to return to a regular diet and activity immediately after the procedure. The procedure uses a tiny telescope that is about half the size of a spaghetti noodle, which is inserted into the salivary duct through the mouth. Once inside the duct, a variety of instruments are used to clean out the duct and remove the stones. This procedure typically takes about 1 hour and is done with the patient asleep under anesthesia. Sialendoscopy is an excellent treatment for individuals who suffer from chronic salivary gland stones or those who are have larger stones that cause pain and block saliva ducts. This minimally invasive procedure comes with several benefits, including:    No external skin incision and no scar in most cases  Rapid recovery  Return home same day  Very little pain and swelling afterwards      Risk include  Bleeding, pain, swelling, infection, dry mouth, numbness of the tongue, duct stenosis, gland or duct perforation or return of symptoms.     As with all surgeries, there are risks with general anesthesia (heart attack, stroke and death).      If you decided to proceed with surgery.   1. Please call my OR scheduler, Charm Barges, at [phone] to schedule surgery.  2. You will have a appointment with anesthesia before surgery for any testing that will need to be done before the surgery. This will be scheduled once the surgery is scheduled.   3. A more detailed instructions will be given to you, but make sure that  ?? You stop aspirin or ibuprofen 7-10 days before surgery if the prescribing physician is okay with this. Tylenol is okay to take before surgery.  ?? Avoid all herbal supplements 7 days prior to surgery because they can cause bleeding or other operative complications. A few commonly taken herbal supplements to avoid are: Ginko Biloba, Garlic, Ginseng, Ginger, [place], Ephedra, Feverfew, St. John???s Wort and/or Omega 3 fatty acids.  ?? You should have nothing to eat or drink after midnight the night before your surgery.        After Care Removal of Sialoendoscopy     What to Expect  ?? You will likely have some soreness the next few days. Some blood tinged saliva is normal. If you bleeding increases, there is increase pain, fever or pus draining from the area, please let me know.    ?? Gentle massages and warm compresses to the saliva gland can also help with the soreness especially after eating.   ?? You will have some swelling of the saliva gland. This is normal and should resolve in about a week.       Activity    ? Minimize your activities with only light activity for the first week following surgery.   o Listen to your body!  If you feel tired over the first few days, you should rest.  ? No straining or heavy lifting > 2 bags of groceries.     Medications   ?? You may be given some medication for pain, but often times acetaminophen (Tylenol), ibuprofen (Advil, Motrin), or naproxen (Aleve) is enough to stop the  pain. Be safe with medicines. Read and follow all instructions on the label.   ?? Narcotic medications may cause constipation. Ensure you have adequate (>25 grams/day) of fiber in your diet and drink at least 64 oz. of water daily. You may also wish to take an over the counter stool softener once or twice daily.     Diet  ?? It is okay to eat and drink. Start with liquid diet and stay on this for a couple days. Advance to soft foods that are easy to swallow in a couple of days.  ?? Avoid foods that might sting. These include salty or spicy foods, citrus fruits or juices, and tomatoes. Avoid trauma to the area and be gentle when brushing in that area.    ?? You may have some soreness in the area after eating or drinking. Massage the saliva gland on the outside of your neck to help with this.   ?? Make sure to stay well hydrated.  ?? If there is some pain after eating, sour candies or sucking on lemons can help.     Driving   ? Do not drive while taking prescription pain medication!  ? Do not drive within 24 hours of receiving anesthesia    Follow-up  ?? Please follow-up in 1-2 weeks. Call either the [facility] clinic number [phone] the [facility] [phone] to set up the appointment.     Where to Call if there is a problem   ?? The [facility] ENT clinic number for my nurse Phineas Semen is [phone]. The [facility] Triage line is [phone] or [phone]. The [facility] number is [phone]. Call for fever >101.79F by mouth, uncontrolled nausea or vomiting, pain uncontrolled with prescribed medication, rapidly spreading redness, purulent discharge, or increasing bleeding or steady clear fluid drainage from nose, or difficulty breathing; also call for any new or concerning symptoms including changes in vision and mental status. Call Hospital Operator and ask for ENT resident on call for urgent questions or concerns after business hours  [phone].           For [employer] [facility]  ? For nursing questions, please call Dr. Brayton Caves nurse, Phineas Semen at [phone].  ? To schedule surgery, please call Charm Barges at [phone].  ? For Financial counseling, please call Annice Needy at [phone].   ? For [facility] radiology scheduling, please call [phone].   ? To schedule appointments: [phone]   ? Office fax: [phone].  ? For Urgent Medical Advice/Triage Nurse during normal business hours (Mon-Fri 8:15am-4:00 pm), please call [phone].   ? After hours, please call [phone] and ask for the ENT physician on call.      For [employer]  ? For nursing questions, surgery scheduling issues or appointments, please call [phone]. ? For [facility] radiology scheduling, please call [phone].   ? Office fax: [phone].

## 2018-11-11 NOTE — Unmapped (Signed)
Addended by: Pia Mau E on: 11/11/2018 09:59 AM     Modules accepted: Orders

## 2018-11-12 NOTE — Unmapped (Signed)
Dorthy Cooler from [place] left message that authorization is pending (did not state what was pending) #[id number]

## 2018-11-14 ENCOUNTER — Ambulatory Visit: Admit: 2018-11-14 | Discharge: 2018-11-15 | Payer: PRIVATE HEALTH INSURANCE

## 2018-11-14 DIAGNOSIS — R609 Edema, unspecified: Principal | ICD-10-CM

## 2018-11-15 ENCOUNTER — Ambulatory Visit
Admit: 2018-11-15 | Discharge: 2018-11-16 | Payer: PRIVATE HEALTH INSURANCE | Attending: Orthopaedic Surgery | Primary: Orthopaedic Surgery

## 2018-11-15 DIAGNOSIS — G5602 Carpal tunnel syndrome, left upper limb: Principal | ICD-10-CM

## 2018-11-15 NOTE — Unmapped (Signed)
[facility] NEW CLINIC NOTE  Date: 11/15/2018   Attending: Abram Sander, MD      Consulting MD: Annita Brod Ca*    Assessment:  43 y.o. female with left carpal tunnel syndrome.    Plan  We had a good discussion of possible treatment options with her today.   She has tried been successful over the years with nonoperative management including injections and night splinting.  Her symptoms have progressively gotten worse.  Given her good success from previous injections as well as right carpal tunnel release we feel be reasonable to proceed with left carpal tunnel release. Operative and non-operative options discussed. The previous attempt of nonoperative management, and the patient's desire to proceed, I offered a surgical plan as outlined below.  We discussed goals, likelihood of success, alternatives, risks, and costs.  Physical risks of operative treatment discussed, including infection, bleeding, neurovascular damage, and risks undergoing anesthesia.  We specifically addressed the risk of recurrence.  Benefits also discussed.  The patient wished to proceed, signing her own preoperative consent.  She will continue Plaquenil through the perioperative period.    PROCEDURES  None    Subjective:  Chief Complaint: left hand pain/numbness tingling    HPI   43 y.o. female  right hand dominant who presents for evaluation of left hand pain and numbness and tingling in the fingers.  She has a history of lupus on Plaquenil.  She is not on any pain medications.  She states the pain is gotten progressively worse over the past few months.       Prior treatments have consisted of steroid injection and splinting.  She has been managed by her rheumatologist.  She has had good relief from the injections previously.  Her last injection was about 6 months ago.  She states the night splints have helped with her pain at night.    Electrodiagnostic studies have been performed back in 2009 before her right carpal tunnel release with Dr. Solon Augusta.  The studies then said right worse than left carpal tunnel syndrome.    Review of Systems: No fevers or chills.  No chest pain or SOB.      Medical History  Past Medical History:   Diagnosis Date   ??? Bulging lumbar disc 2005   ??? Cirrhosis of liver (CMS-HCC)    ??? Difficult intravenous access    ??? Fibromyalgia 03/09/2013   ??? NASH (nonalcoholic steatohepatitis)    ??? Secondary Sjogren's syndrome (CMS-HCC) 03/09/2013    Secondary to SLE  Diagnosed: 12/2003 (by sx's, parotid gland enlargement, and serologies) Pathology: biopsy not done Labs: + ANA, anti-SSA, anti-SSB, RF    ??? Systemic lupus erythematosus (CMS-HCC) 01/19/2011   ??? Valvular regurgitation        Surgical History  Past Surgical History:   Procedure Laterality Date   ??? CARPAL TUNNEL RELEASE     ??? CESAREAN SECTION     ??? CHOLECYSTECTOMY     ??? HERNIA REPAIR     ??? PR UPPER GI ENDOSCOPY,DIAGNOSIS N/A 05/10/2016    Procedure: UGI ENDO, INCLUDE ESOPHAGUS, STOMACH, & DUODENUM &/OR JEJUNUM; DX W/WO COLLECTION SPECIMN, BY BRUSH OR WASH;  Surgeon: Carmon Ginsberg, MD;  [facility];  Service: Gastroenterology   ??? TOTAL ABDOMINAL HYSTERECTOMY     ??? TUBAL LIGATION         Medications  Reviewed and updated.     Allergies  Allergies   Allergen Reactions   ??? Aspirin Swelling  Other reaction(s): SWELLING/EDEMA   ??? Fish Containing Products Diarrhea and Nausea And Vomiting   ??? Iodine Other (See Comments)     Pt told not to have d/t shellfish allergy   ??? Shellfish Containing Products Hives, Diarrhea and Nausea Only     Other reaction(s): HIVES  Other reaction(s): HIVES  Other reaction(s): NAUSEA       Social History   - Tobacco: Denies   -Etoh: Denies  - Illicit drugs: Denies.     Family History   Non contributory      Physical Examination:    General  Well nourished, appearing stated age   Not in acute distress   Alert and oriented x3  Appropriate affect and mood  Appropriate to conversation   No increased work of breathing on room air    Musculoskeletal  Left Upper Extremity:  Skin  -appears pink and well perfused with no evidence of trauma.   ROM  -active ROM fingers, wrist, elbow is painless.   Inspection/Palpation  - No tenderness to palpation about the elbow, wrist, or hand   No wasting of the thenar or interossei muscles appreciated  Motor/Strength  -Wrist extension, wrist flexion, IO, grip strength 5/5  APB and opponens strength is 5/5  Interossei strength is 5/5  Sensory  -sensation to light touch intact in median, radial and ulnar nerve distributions  median nerve distribution with diminished sensation to light touch   Stability  - No gross instability noted at the elbow, wrist, or fingers  Vascular  -2+ radial pulse  Provocative testing  -Carpal tunnel Tinel's negative   Durkin's compressive testing positive at the carpal tunnel   Cubital tunnel Tinel's negative   Durkin's compressive testing negative at the cubital tunnel    Right Upper Extremity:  Inspection/Skin  -Skin intact, appears pink and well perfused with no evidence of trauma. Well healed surgical incision.   ROM  -Active ROM fingers, wrist, elbow is full and painless   Palpation  -No TTP anywhere about the elbow, wrist, hand or fingers.   Motor/Strength  -5/5 strength throughout, able to fire anterior interosseous, posterior interosseous, and ulnar nerves  Sensory  -Sensation to light touch intact in median, radial and ulnar nerve distributions  Stability  -No instability with stress of the elbow, wrist, or fingers  Vascular   -Fingers are warm, with brisk capillary refill  Provocative testing  -Median nerve provacative testing (Durkin's and Tinel's) negative at the carpal tunnel  -Ulnar nerve provocative testing (Durkin's and Tinel's) negative at cubital tunnel      Imaging:   No new films today.

## 2018-11-15 NOTE — Unmapped (Signed)
Discussed CT with patient. No stones. Will plan on right side as discussed for sialendoscopy. Mild prominence of tonsil. Patient has had sore throat recently. Will re-evaluate when see her.

## 2018-11-20 NOTE — Unmapped (Signed)
Attending Attestation:  I saw and evaluated the patient, participating in the key portions of the service.     I determined the assessment and plan of care for the patient.  I reviewed and agree with the documented findings and plan in the resident's note above.  --J. Megan M. Jazmin Ley, MD

## 2018-11-25 ENCOUNTER — Ambulatory Visit: Admit: 2018-11-25 | Discharge: 2018-11-26 | Payer: PRIVATE HEALTH INSURANCE

## 2018-11-25 DIAGNOSIS — K7460 Unspecified cirrhosis of liver: Secondary | ICD-10-CM

## 2018-11-25 DIAGNOSIS — K7581 Nonalcoholic steatohepatitis (NASH): Principal | ICD-10-CM

## 2018-11-28 ENCOUNTER — Ambulatory Visit: Admit: 2018-11-28 | Discharge: 2018-11-29 | Payer: PRIVATE HEALTH INSURANCE

## 2018-11-28 DIAGNOSIS — M3500 Sicca syndrome, unspecified: Secondary | ICD-10-CM

## 2018-11-28 DIAGNOSIS — R609 Edema, unspecified: Principal | ICD-10-CM

## 2018-11-28 DIAGNOSIS — K7460 Unspecified cirrhosis of liver: Secondary | ICD-10-CM

## 2018-11-28 DIAGNOSIS — M797 Fibromyalgia: Secondary | ICD-10-CM

## 2018-11-28 DIAGNOSIS — M329 Systemic lupus erythematosus, unspecified: Secondary | ICD-10-CM

## 2018-11-28 DIAGNOSIS — K7581 Nonalcoholic steatohepatitis (NASH): Secondary | ICD-10-CM

## 2018-11-28 DIAGNOSIS — G5602 Carpal tunnel syndrome, left upper limb: Secondary | ICD-10-CM

## 2018-11-28 DIAGNOSIS — K219 Gastro-esophageal reflux disease without esophagitis: Secondary | ICD-10-CM

## 2018-11-28 DIAGNOSIS — D696 Thrombocytopenia, unspecified: Secondary | ICD-10-CM

## 2018-11-28 LAB — CBC
HEMATOCRIT: 43.5 % (ref 36.0–46.0)
HEMOGLOBIN: 14.1 g/dL (ref 12.0–16.0)
MEAN CORPUSCULAR HEMOGLOBIN CONC: 32.4 g/dL (ref 31.0–37.0)
MEAN CORPUSCULAR HEMOGLOBIN: 30.7 pg (ref 26.0–34.0)
PLATELET COUNT: 133 10*9/L — ABNORMAL LOW (ref 150–440)
RED BLOOD CELL COUNT: 4.58 10*12/L (ref 4.00–5.20)
RED CELL DISTRIBUTION WIDTH: 13.3 % (ref 12.0–15.0)
WBC ADJUSTED: 2.7 10*9/L — ABNORMAL LOW (ref 4.5–11.0)

## 2018-11-28 LAB — COMPREHENSIVE METABOLIC PANEL
ALBUMIN: 4.1 g/dL (ref 3.5–5.0)
ALKALINE PHOSPHATASE: 51 U/L (ref 38–126)
ALT (SGPT): 41 U/L — ABNORMAL HIGH (ref <35)
AST (SGOT): 37 U/L (ref 14–38)
BILIRUBIN TOTAL: 0.5 mg/dL (ref 0.0–1.2)
BLOOD UREA NITROGEN: 10 mg/dL (ref 7–21)
BUN / CREAT RATIO: 14
CALCIUM: 9.5 mg/dL (ref 8.5–10.2)
CHLORIDE: 106 mmol/L (ref 98–107)
CO2: 24 mmol/L (ref 22.0–30.0)
CREATININE: 0.69 mg/dL (ref 0.60–1.00)
EGFR CKD-EPI AA FEMALE: >90 mL/min/{1.73_m2} (ref >=60)
EGFR CKD-EPI NON-AA FEMALE: >90 mL/min/{1.73_m2} (ref >=60)
GLUCOSE RANDOM: 105 mg/dL (ref 70–179)
POTASSIUM: 4.6 mmol/L (ref 3.5–5.0)
PROTEIN TOTAL: 8.1 g/dL (ref 6.5–8.3)
SODIUM: 142 mmol/L (ref 135–145)

## 2018-11-28 LAB — PROTIME-INR: PROTIME: 14.5 s — ABNORMAL HIGH (ref 10.2–13.1)

## 2018-11-28 LAB — PROTIME: Lab: 14.5 — ABNORMAL HIGH

## 2018-11-28 LAB — MEAN CORPUSCULAR VOLUME: Lab: 94.9

## 2018-11-28 LAB — BLOOD UREA NITROGEN: Urea nitrogen:MCnc:Pt:Ser/Plas:Qn:: 10

## 2018-11-28 NOTE — Unmapped (Signed)
Per Anesthesia's guidelines:    Please take the following medications the morning of your procedure with a sip of water:      Famotidine  Hydroxychloroquine      No implants

## 2018-12-02 NOTE — Unmapped (Signed)
Patient was called to schedule Pre Test for 12/04/18    Did patient confirm appointment? Yes

## 2018-12-02 NOTE — Unmapped (Signed)
-----   Message from Rodolph Bong, LPN sent at 1/61/0960  1:45 PM EDT -----  Patient has a care plan that requires asymptomatic respiratory testing prior to implementation.   The date 12/06/18 , time 1204  of the treatment, surgery, or procedure for which they need testing is 12/04/18   Please schedule a COVID PRE TEST appointment in [facility] Quick Test [facility].

## 2018-12-04 ENCOUNTER — Ambulatory Visit: Admit: 2018-12-04 | Discharge: 2018-12-05 | Payer: PRIVATE HEALTH INSURANCE | Attending: Family | Primary: Family

## 2018-12-04 DIAGNOSIS — Z1159 Encounter for screening for other viral diseases: Principal | ICD-10-CM

## 2018-12-04 DIAGNOSIS — R609 Edema, unspecified: Principal | ICD-10-CM

## 2018-12-04 NOTE — Unmapped (Deleted)
Otolaryngology Post-Operative Visit Note      Reason for Visit:  Post-op     History of Present Illness    The patient is a 43 y.o. female who presents for follow-up s/p sialendoscopy.     The patient denies fevers, chills, shortness of breath, chest pain, nausea, vomiting, diarrhea, inability to lie flat, dysphagia, odynophagia, hemoptysis, hematemesis, changes in vision, changes in voice quality, otalgia, otorrhea, vertiginous symptoms, focal deficits, or other concerning symptoms.    Review of Systems    A 12 system review of systems was performed and is negative other than that noted in the history of present illness.    Vital Signs  not currently breastfeeding.    Physical Exam    General: Well-developed, well-nourished. Appropriate, comfortable, and in no apparent distress.  Voice: clear   Head/Face: On external examination there is no obvious asymmetry or scars. On palpation there is no tenderness over maxillary sinuses or masses within the salivary glands. Cranial nerves V and VII are intact through all distributions.  Eyes: PERRL, EOMI, the conjunctiva are not injected and sclera is non-icteric.  Ears:   Right ear: On external exam, there is no obvious lesions or asymmetry. No cerumen or lesions in the EAC. The TM is in the neutral position and mobile to pneumatic otoscopy. No middle ear masses or fluid noted.   Hearing is grossly intact.  Left ear: On external exam, there is no obvious lesions or asymmetry. No cerumen or lesions in the EAC. The TM is in the neutral position and mobile to pneumatic otoscopy. No middle ear masses or fluid noted.   Hearing is grossly intact.  Nose: No external masses, lesions, or deformity. Inspection of nasal mucosa, septum, and turbinates is normal to anterior rhinoscopy.   Oral cavity/oropharynx: The mucosa of the lips, gums, hard and soft palate, posterior pharyngeal wall, tongue, floor of mouth, and buccal region are without masses or lesions and are normally hydrated. Good dentition. Tongue protrudes midline. Tonsils are symmetric without any masses or lesions. Supraglottis not visualized due to gag reflex.  Neck: There is no asymmetry or masses. Trachea is midline. There is no enlargement of the thyroid or palpable thyroid nodules.   Lymphatics: There is no palpable lymphadenopathy along the jugulodiagastric, submental, or posterior cervical chains.  Chest: No audible wheeze, unlabored respirations.  Neurologic: Cranial nerve???s II-XII are grossly intact. Exam is non-focal.  Extremities: No cyanosis, clubbing or edema.        Assessment/Recommendations:    The patient is a 43 y.o. female s/p sialendoscopy.     The patient voiced complete understanding of plan as detailed above and is in full agreement.

## 2018-12-04 NOTE — Unmapped (Signed)
COVID Pre-Procedure Intake Form     Assessment     Tonya Brady is a 43 y.o. female presenting to [facility] for COVID testing.     Plan     If no testing performed, pt counseled on routine care for respiratory illness.  If testing performed, COVID sent.  Patient directed to Home given findings during today's visit.    Subjective     Tonya Brady is a 43 y.o. female who presents to the [facility] with complaints of the following:    Exposure History: In the last 21 days?     Have you traveled outside of [place]? No               Have you been in close contact with someone confirmed by a test to have COVID? (Close contact is within 6 feet for at least 10 minutes) No       Have you worked in a health care facility? No     Lived or worked facility like a nursing home, group home, or assisted living?    No         Are you scheduled to have surgery or a procedure in the next 3 days? No               Are you scheduled to receive cancer chemotherapy within the next 7 days?    No     Have you ever been tested before for COVID-19 with a swab of your nose? No   Are you a healthcare worker being tested so to return to work No     Right now,  do you have any of the following that developed over the past 7 days (as stated by patient on intake form):    Subjective fever (felt feverish) No   Chills (especially repeated shaking chills) No   Severe fatigue (felt very tired) No   Muscle aches No   Runny nose No   Sore throat No   Loss of taste or smell No   Cough (new onset or worsening of chronic cough) No   Shortness of breath No   Nausea or vomiting No   Headache No   Abdominal Pain No   Diarrhea (3 or more loose stools in last 24 hours) No       Scribe's Attestation: Paulita Fujita, FNP obtained and performed the history, physical exam and medical decision making elements that were entered into the chart.  Signed by Mal Amabile, LCSW serving as Scribe, on 12/04/2018 9:47 AM      The documentation recorded by the scribe accurately reflects the service I personally performed and the decisions made by me. Aida Puffer, FNP  December 05, 2018 10:40 AM

## 2018-12-06 ENCOUNTER — Ambulatory Visit: Admit: 2018-12-06 | Discharge: 2018-12-06 | Payer: PRIVATE HEALTH INSURANCE

## 2018-12-06 ENCOUNTER — Encounter
Admit: 2018-12-06 | Discharge: 2018-12-06 | Payer: PRIVATE HEALTH INSURANCE | Attending: Certified Registered" | Primary: Certified Registered"

## 2018-12-06 DIAGNOSIS — R609 Edema, unspecified: Principal | ICD-10-CM

## 2018-12-06 MED ORDER — PROMETHAZINE 12.5 MG TABLET
ORAL_TABLET | Freq: Four times a day (QID) | ORAL | 0 refills | 3.00000 days | Status: CP | PRN
Start: 2018-12-06 — End: 2018-12-30
  Filled 2018-12-06: qty 10, 2d supply, fill #0

## 2018-12-06 MED ORDER — OXYCODONE 5 MG TABLET
ORAL_TABLET | ORAL | 0 refills | 2.00000 days | Status: CP | PRN
Start: 2018-12-06 — End: 2018-12-30
  Filled 2018-12-06: qty 10, 1d supply, fill #0

## 2018-12-06 MED ORDER — METHYLPREDNISOLONE 4 MG TABLETS IN A DOSE PACK
ORAL_TABLET | Freq: Every day | ORAL | 0 refills | 21.00000 days | Status: CP
Start: 2018-12-06 — End: 2019-12-06
  Filled 2018-12-06: qty 21, 6d supply, fill #0

## 2018-12-06 MED FILL — OXYCODONE 5 MG TABLET: 1 days supply | Qty: 10 | Fill #0 | Status: AC

## 2018-12-06 MED FILL — PROMETHAZINE 12.5 MG TABLET: 2 days supply | Qty: 10 | Fill #0 | Status: AC

## 2018-12-06 MED FILL — METHYLPREDNISOLONE 4 MG TABLETS IN A DOSE PACK: 6 days supply | Qty: 21 | Fill #0 | Status: AC

## 2018-12-06 NOTE — Unmapped (Signed)
Instructions after anesthesia:  No driving for 24 hours or while taking narcotic pain medicine.  No drinking alcohol, signing legal documents or operating machinery for 24 hours.    Go home and rest for the next 24 hours even if you are feeling well. You may feel light headed and/or dizzy. Stand up slowly and be careful on stairs.    Page the ENT resident at [phone],  Especially during the night and on weekends.  This is a 24/7 number if you have any questions, concerns or problems.   If you are unable to urinate when your bladder feels full, or if you have not urinated in 8 hours.  If you have a fever.  If you have nausea and vomiting that lasts more that 4 hours after discharge.  If you have bleeding that does not resolve with rest and elevation.  If you have severe pain not helped by medications.  If your incision has signs of infection such as pus or foul smelling drainage, redness, swelling, or feels hard and hot to touch.    Post operative sialadenectomy:    Eat soft foods for the next several days. Nothing more chewy than a meatball.  Cold or room temperature liquids.  Fluids are important after surgery.  It is necessary to keep moisture in the throat.    Avoid citrus.    Sleep with a couple of pillows, keep the room temperature cool.      Gentle Brushing is fine after surgery, do not use mouthwash or gargle.       When the healing starts, in a few days, scabs will form, providing relief from the throat pain.      Continue to take Tylenol every 8 hours for the next several days.   If needed, Oxycodone can be used with Tylenol.   Do Not exceed 3500 mg of Tylenol in a 24 hour.

## 2018-12-06 NOTE — Unmapped (Signed)
Preoperative Diagnosis:  Bilateral Parotid gland Sialadenitis    Postoperative Diagnosis:  Same.    Procedure(s) Performed:  1.   right Parotid gland Sialendoscopy (CPT [id number] ).      Teaching Surgeon:   Madelon Lips, M.D.    Assistants:   None dictated.     Anesthesia:   General endotracheal anesthesia.    Specimens:   None     Estimated Blood Loss:   Less than 10 mL     Complications:   None.    Operative Findings:   1. Right parotid duct without stones or debris     Drains:  None    Disposition:   Stable to PACU    Indications for procedure:   Patient is a 43 y.o. 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 Patient was recommended to have a sialendoscopy to evaluate the duct's patency. The risk, benefits and alternatives including but not limited to the risks of bleeding, infection, no improvement in symptoms, and injury to the duct, need for more surgery or removal of the gland, were dicussed with the patient who elected to proceed with surgery. Will start on the right and if improvement will do left at latter date.     Procedure:   The patient was properly identified in the preoperative holding area and all consents were reviewed with the patient. The patient was taken back to the Operating Room by the Anesthesia staff and underwent general anesthesia with nasotracheal intubation. The head was draped with a blue towel and a piece of egg crate foam was applied to the  forehead and the nasotracheal tube was taped to the foam on the forehead to  secure the tube and make sure there was no pressure on the nasal ala. A brief time out was performed to confirm the patient???s identity and procedures to be performed, which were verified to be correct. Antibiotics were given. The table was then turned 90 degrees and the patient was then prepped and draped in the appropriate fashion for the aforementioned procedure.     A small bite block was placed on the left side and a sweetheart retractor was used to retract the tongue to the right side, exposing the right Stenson's duct.  Methylene Blue was then used to find the right duct.  After the papilla was identified, the right parotid duct punctum was serially dilated with lacrimal probes.  Once the duct was adequately dilated, the 1.1 mm sialendoscope was then inserted into the duct and advanced proximally towards the hilum of the duct.  There was noted to be lots of debris in the duct. Saline was used to irrigate the duct. No stones were found obstructing the duct. There was minimal debris and able to pass scope easily. Kenalog 10 was then injected into the duct. At this point, there was noted to be hemostasis, and the scope was removed. All insturments were removed from the oral cavity.     At this time, care of this patient was transferred back over to the anesthesia team for extubation. The patient was then extubated and transferred to the PACU in stable condition. All instrument, sharp and lap counts were correct at the end of the case.     Teaching Attestation: I, Madelon Lips, MD was present and scrubbed throughout the entirety of the case.

## 2018-12-18 NOTE — Unmapped (Signed)
Otolaryngology Post-Operative Visit Note      Reason for Visit:  Post-op     History of Present Illness    The patient is a 43 year old who presents for follow-up s/p sialendoscopy of the right gland. Patient is doing well. Feels like saliva gland has improved. Fullness has resolved. Getting more saliva. Interested in doing the other side.     The patient denies fevers, chills, shortness of breath, chest pain, nausea, vomiting, diarrhea, inability to lie flat, dysphagia, odynophagia, hemoptysis, hematemesis, changes in vision, changes in voice quality, otalgia, otorrhea, vertiginous symptoms, focal deficits, or other concerning symptoms.    Review of Systems    A 12 system review of systems was performed and is negative other than that noted in the history of present illness.    Vital Signs  Blood pressure 137/71, pulse 65, temperature 37.3 ??C (99.1 ??F), SpO2 100 %, not currently breastfeeding.        Physical Exam    General: Well-developed, well-nourished. Appropriate, comfortable, and in no apparent distress.  Voice: clear   Head/Face: On external examination there is no obvious asymmetry or scars. On palpation there is no tenderness over maxillary sinuses or masses within the salivary glands. Firmness over the right parotid gland is improved significantly. Able to get saliva out easily. Cranial nerves V and VII are intact through all distributions.  Eyes: PERRL, EOMI, the conjunctiva are not injected and sclera is non-icteric.  Ears:   Right ear: On external exam, there is no obvious lesions or asymmetry. Hearing is grossly intact.  Left ear: On external exam, there is no obvious lesions or asymmetry. Hearing is grossly intact.  Nose: No external masses, lesions, or deformity. Inspection of nasal mucosa, septum, and turbinates is normal to anterior rhinoscopy.   Oral cavity/oropharynx: The mucosa of the lips, gums, hard and soft palate, posterior pharyngeal wall, tongue, floor of mouth, and buccal region are without masses or lesions and are normally hydrated. Good dentition. Tongue protrudes midline. Tonsils are symmetric without any masses or lesions. Supraglottis not visualized due to gag reflex. The duct is well dilated and able to get saliva.   Neck: There is no asymmetry or masses. Trachea is midline. There is no enlargement of the thyroid or palpable thyroid nodules.   Lymphatics: There is no palpable lymphadenopathy along the jugulodiagastric, submental, or posterior cervical chains.  Chest: No audible wheeze, unlabored respirations.  Neurologic: Cranial nerve???s II-XII are grossly intact. Exam is non-focal.  Extremities: No cyanosis, clubbing or edema.        Assessment/Recommendations:    The patient is a 43 year old femal s/p sialendoscopy. Help significantly - by exam and per patient reports. Will do the left side given improvement. Discussed the risk, benefits and alternatives including but not limited to the risks of bleeding, pain, swelling, infection, dry mouth, numbness of the tongue, duct stenosis, gland or duct perforation or return of symptoms. Discussed that patients often have worse swelling after surgery for a week. Patient wishes to proceed with surgery.     The patient voiced complete understanding of plan as detailed above and is in full agreement.

## 2018-12-23 ENCOUNTER — Ambulatory Visit: Admit: 2018-12-23 | Discharge: 2018-12-24 | Payer: PRIVATE HEALTH INSURANCE

## 2018-12-23 DIAGNOSIS — R609 Edema, unspecified: Secondary | ICD-10-CM

## 2018-12-23 NOTE — Unmapped (Signed)
For [employer]  ? For nursing questions, please call Dr. Brayton Caves nurse, Phineas Semen at [phone].  ? To schedule surgery, please call Charm Barges at [phone].  ? For Financial counseling, please call Annice Needy at [phone].   ? For [facility] radiology scheduling, please call [phone].   ? To schedule appointments: [phone]   ? Office fax: [phone].  ? For Urgent Medical Advice/Triage Nurse during normal business hours (Mon-Fri 8:15am-4:00 pm), please call [phone].   ? After hours, please call [phone] and ask for the ENT physician on call.      For [employer]  ? For nursing questions, surgery scheduling issues or appointments, please call [phone].  ? For [facility] radiology scheduling, please call [phone].   ? Office fax: [phone].

## 2018-12-27 DIAGNOSIS — G5602 Carpal tunnel syndrome, left upper limb: Principal | ICD-10-CM

## 2018-12-28 ENCOUNTER — Ambulatory Visit: Admit: 2018-12-28 | Discharge: 2018-12-29 | Payer: PRIVATE HEALTH INSURANCE

## 2018-12-28 DIAGNOSIS — Z1159 Encounter for screening for other viral diseases: Principal | ICD-10-CM

## 2018-12-28 NOTE — Unmapped (Signed)
COVID Pre-Procedure Intake Form     Assessment     Tonya Brady is a 43 y.o. female presenting to [facility] for COVID testing.     Plan     If no testing performed, pt counseled on routine care for respiratory illness.  If testing performed, COVID sent.  Patient directed to Home given findings during today's visit.    Subjective     Tonya Brady is a 43 y.o. female who presents to the [facility] with complaints of the following:    Exposure History: In the last 21 days?     Have you traveled outside of [place]? No               Have you been in close contact with someone confirmed by a test to have COVID? (Close contact is within 6 feet for at least 10 minutes) No       Have you worked in a health care facility? No     Lived or worked facility like a nursing home, group home, or assisted living?    No         Are you scheduled to have surgery or a procedure in the next 3 days? Yes               Are you scheduled to receive cancer chemotherapy within the next 7 days?    No     Have you ever been tested before for COVID-19 with a swab of your nose? Yes: When: 07/20, Where: [place]   Are you a healthcare worker being tested so to return to work No     Right now,  do you have any of the following that developed over the past 7 days (as stated by patient on intake form):    Subjective fever (felt feverish) No   Chills (especially repeated shaking chills) No   Severe fatigue (felt very tired) No   Muscle aches No   Runny nose No   Sore throat No   Loss of taste or smell No   Cough (new onset or worsening of chronic cough) No   Shortness of breath No   Nausea or vomiting No   Headache No   Abdominal Pain No   Diarrhea (3 or more loose stools in last 24 hours) No       Scribe's Attestation: Cristin Mulderig Colford, MD obtained and performed the history, physical exam and medical decision making elements that were entered into the chart.  Signed by Alleen Borne serving as Scribe, on 12/28/2018 9:45 AM

## 2018-12-30 ENCOUNTER — Ambulatory Visit: Admit: 2018-12-30 | Discharge: 2018-12-30 | Payer: PRIVATE HEALTH INSURANCE

## 2018-12-30 ENCOUNTER — Encounter: Admit: 2018-12-30 | Discharge: 2018-12-30 | Payer: PRIVATE HEALTH INSURANCE

## 2018-12-30 DIAGNOSIS — G5602 Carpal tunnel syndrome, left upper limb: Principal | ICD-10-CM

## 2018-12-30 MED ORDER — HYDROCODONE 5 MG-ACETAMINOPHEN 325 MG TABLET
ORAL_TABLET | Freq: Three times a day (TID) | ORAL | 0 refills | 2.00000 days | Status: CP | PRN
Start: 2018-12-30 — End: 2019-01-04
  Filled 2018-12-30: qty 6, 2d supply, fill #0

## 2018-12-30 MED FILL — HYDROCODONE 5 MG-ACETAMINOPHEN 325 MG TABLET: 2 days supply | Qty: 6 | Fill #0 | Status: AC

## 2018-12-30 NOTE — Unmapped (Signed)
[facility] NEW CLINIC NOTE  Date: 11/15/2018   Attending: Abram Sander, MD    ??  Consulting MD: Annita Brod Ca*  ??  Assessment:  43 y.o. female with left carpal tunnel syndrome.  ??  Plan  We had a good discussion of possible treatment options with her today.   She has tried been successful over the years with nonoperative management including injections and night splinting.  Her symptoms have progressively gotten worse.  Given her good success from previous injections as well as right carpal tunnel release we feel be reasonable to proceed with left carpal tunnel release. Operative and non-operative options discussed. The previous attempt of nonoperative management, and the patient's desire to proceed, I offered a surgical plan as outlined below.  We discussed goals, likelihood of success, alternatives, risks, and costs.  Physical risks of operative treatment discussed, including infection, bleeding, neurovascular damage, and risks undergoing anesthesia.  We specifically addressed the risk of recurrence.  Benefits also discussed.  The patient wished to proceed, signing her own preoperative consent.  She will continue Plaquenil through the perioperative period.  ??  PROCEDURES  None  ??  Subjective:  Chief Complaint: left hand pain/numbness tingling  ??  HPI   43 y.o. female  right hand dominant who presents for evaluation of left hand pain and numbness and tingling in the fingers.  She has a history of lupus on Plaquenil.  She is not on any pain medications.  She states the pain is gotten progressively worse over the past few months.       Prior treatments have consisted of steroid injection and splinting.  She has been managed by her rheumatologist.  She has had good relief from the injections previously.  Her last injection was about 6 months ago.  She states the night splints have helped with her pain at night.  ??  Electrodiagnostic studies have been performed back in 2009 before her right carpal tunnel release with Dr. Solon Augusta.  The studies then said right worse than left carpal tunnel syndrome.  ??  Review of Systems: No fevers or chills.  No chest pain or SOB.  ??  ??  Medical History  Past Medical History        Past Medical History:   Diagnosis Date   ??? Bulging lumbar disc 2005   ??? Cirrhosis of liver (CMS-HCC) ??   ??? Difficult intravenous access ??   ??? Fibromyalgia 03/09/2013   ??? NASH (nonalcoholic steatohepatitis) ??   ??? 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 Secondary to SLE  Diagnosed: 12/2003 (by sx's, parotid gland enlargement, and serologies) Pathology: biopsy not done Labs: + ANA, anti-SSA, anti-SSB, RF    ??? Systemic lupus erythematosus (CMS-HCC) 01/19/2011   ??? Valvular regurgitation ??        ??  Surgical History  Past Surgical History         Past Surgical History:   Procedure Laterality Date   ??? CARPAL TUNNEL RELEASE ?? ??   ??? CESAREAN SECTION ?? ??   ??? CHOLECYSTECTOMY ?? ??   ??? HERNIA REPAIR ?? ??   ??? PR UPPER GI ENDOSCOPY,DIAGNOSIS N/A 05/10/2016   ?? Procedure: UGI ENDO, INCLUDE ESOPHAGUS, STOMACH, & DUODENUM &/OR JEJUNUM; DX W/WO COLLECTION SPECIMN, BY BRUSH OR WASH;  Surgeon: Carmon Ginsberg, MD;  [facility];  Service: Gastroenterology   ??? TOTAL ABDOMINAL HYSTERECTOMY ?? ??   ??? TUBAL LIGATION ?? ??        ??  Medications  Reviewed and updated.   ??  Allergies        Allergies   Allergen Reactions   ??? Aspirin Swelling   ?? ?? Other reaction(s): SWELLING/EDEMA   ??? Fish Containing Products Diarrhea and Nausea And Vomiting   ??? Iodine Other (See Comments)   ?? ?? Pt told not to have d/t shellfish allergy   ??? Shellfish Containing Products Hives, Diarrhea and Nausea Only   ?? ?? Other reaction(s): HIVES  Other reaction(s): HIVES  Other reaction(s): NAUSEA   ??  ??  Social History   - Tobacco: Denies   -Etoh: Denies  - Illicit drugs: Denies.   ??  Family History   Non contributory  ??  ??  Physical Examination:  ??  General  Well nourished, appearing stated age   Not in acute distress   Alert and oriented x3  Appropriate affect and mood  Appropriate to conversation   No increased work of breathing on room air  ??  Musculoskeletal  Left Upper Extremity:  Skin  -appears pink and well perfused with no evidence of trauma.   ROM  -active ROM fingers, wrist, elbow is painless.   Inspection/Palpation  - No tenderness to palpation about the elbow, wrist, or hand   No wasting of the thenar or interossei muscles appreciated  Motor/Strength  -Wrist extension, wrist flexion, IO, grip strength 5/5  APB and opponens strength is 5/5  Interossei strength is 5/5  Sensory  -sensation to light touch intact in median, radial and ulnar nerve distributions  median nerve distribution with diminished sensation to light touch   Stability  - No gross instability noted at the elbow, wrist, or fingers  Vascular  -2+ radial pulse  Provocative testing  -Carpal tunnel Tinel's negative   Durkin's compressive testing positive at the carpal tunnel   Cubital tunnel Tinel's negative   Durkin's compressive testing negative at the cubital tunnel  ??  Right Upper Extremity:  Inspection/Skin  -Skin intact, appears pink and well perfused with no evidence of trauma. Well healed surgical incision.   ROM  -Active ROM fingers, wrist, elbow is full and painless   Palpation  -No TTP anywhere about the elbow, wrist, hand or fingers.   Motor/Strength  -5/5 strength throughout, able to fire anterior interosseous, posterior interosseous, and ulnar nerves  Sensory  -Sensation to light touch intact in median, radial and ulnar nerve distributions  Stability  -No instability with stress of the elbow, wrist, or fingers  Vascular   -Fingers are warm, with brisk capillary refill  Provocative testing  -Median nerve provacative testing (Durkin's and Tinel's) negative at the carpal tunnel  -Ulnar nerve provocative testing (Durkin's and Tinel's) negative at cubital tunnel  ??  ??  Imaging:   No new films today.

## 2018-12-30 NOTE — Unmapped (Signed)
Addendum  created 12/30/18 1527 by Ernesto Rutherford, MD    Clinical Note Signed, Intraprocedure Blocks edited

## 2018-12-30 NOTE — Unmapped (Signed)
Preoperative Diagnosis:    Left carpal tunnel syndrome.    Postoperative Diagnosis:    Same.    Procedure(s)  Performed:    Left carpal tunnel release.    Teaching Surgeon:  Abram Sander, M.D.    Assistants:  Weber Cooks MD.    Drains: None.     Specimens: None.     IV Fluids:  Please see Anesthesia record.    Estimated Blood Loss: Minimal.     Complications: None.     Tourniquet Time:  Please see Anesthesia record.    Indications for Surgery:  The patient is a pleasant 44 y.o. female with a long history of left carpal tunnel syndrome, which has been refractory to conservative management.  She presents to the operating today for carpal tunnel release.    Operative Findings:  please see the dictated body of the operative report.    Procedure:  After the risks, benefits, alternatives and complications of procedure were explained to the patient and her family and all of her questions were answered, she elected to proceed.  Her left upper extremity was marked in the preoperative holding area to clearly identify the operative site.  She was then brought back to the operating room and placed supine on the operative table.  Bier block anesthesia was induced by members of the anesthesia team at which time the tourniquet was inflated.  Her left upper extremity was then prepped and draped in the usual sterile manner.  Prior to the start of the surgical procedure, a timeout was made by members of the Anesthesia, nursing and surgical staff to verify the correct patient, correct operative site and correct procedure.  After this had been completed, we commenced with our operation.    A knife was used to make a 1.5 cm longitudinal incision over the volar aspect of the palm just distal to the volar wrist crease in line with the ring finger ray.  A knife was used to incise the skin.  Electrocautery was used to cauterize the small crossing vessels that were encountered.  The transverse carpal ligament was visualized.  A knife was used to make a small hole in the transverse carpal ligament, taking care to protect the underlying contents of the carpal tunnel. After we gained entry into the carpal tunnel, blunt dissection was used to define the dorsal and volar aspects of the distal transverse carpal ligament and this was then divided under direct visualization in its entirety.  After complete distal release, we directed our attention proximally.  Blunt dissection was used to define the dorsal and volar aspects of the proximal transverse carpal ligament and this was divided under direct visualization in its entirety along with the distalmost aspect of the volar forearm fascia.  After complete release of the transverse carpal ligament, the median nerve was visualized.  It was found to be intact with an area of compression corresponding to the level of transverse carpal ligament.    The wound was then copiously irrigated with normal saline.  The skin was closed with a 4-0 nylon suture in a horizontal mattress fashion.  Ten mL's of 0.5% Marcaine without epinephrine was injected in subcutaneous tissues for postoperative pain control.  A sterile dressing was applied and the patient was awoken from anesthesia and transferred to recovery.    Condition on transfer: stable.     Counts were correct at the end of the procedure.     DVT prophylaxis:  No DVT prophylaxis is indicated as risk  of DVT is acceptably low.     Teaching Surgeon Attestation:  I was the attending physician and was present for the entirety  of the case.

## 2018-12-31 NOTE — Unmapped (Signed)
Addendum  created 12/31/18 1039 by Ernesto Rutherford, MD    Clinical Note Signed

## 2019-01-01 NOTE — Unmapped (Signed)
Addendum  created 01/01/19 1531 by Ernesto Rutherford, MD    Clinical Note Signed, Intraprocedure Event edited, Visit Navigator Flowsheet section accepted

## 2019-01-09 MED FILL — EMPTY CONTAINER: 30 days supply | Qty: 1 | Fill #0 | Status: AC

## 2019-01-09 MED FILL — EMPTY CONTAINER: 30 days supply | Qty: 1 | Fill #0

## 2019-01-09 MED FILL — BENLYSTA 200 MG/ML SUBCUTANEOUS AUTO-INJECTOR: 28 days supply | Qty: 4 | Fill #0 | Status: AC

## 2019-01-10 ENCOUNTER — Ambulatory Visit
Admit: 2019-01-10 | Discharge: 2019-01-11 | Payer: PRIVATE HEALTH INSURANCE | Attending: Orthopaedic Surgery | Primary: Orthopaedic Surgery

## 2019-01-10 DIAGNOSIS — G5602 Carpal tunnel syndrome, left upper limb: Principal | ICD-10-CM

## 2019-01-10 NOTE — Unmapped (Signed)
INTERIM HISTORY:  Ms. Stovall returns today for her first postoperative visit after undergoing a left carpal tunnel release on 12/30/2018.  She has been doing very well since her surgery.  She has minimal discomfort.  The numbness and tingling which was present preoperatively has resolved.      PHYSICAL EXAMINATION:  EXTREMITIES:  Examination of the left upper extremity shows the incision is well healed.  There is minimal tenderness at the surgical site.  There is normal sensation to light touch distally.  Range of motion of the fingers is full with no discomfort.  There is minimal swelling present.    ASSESSMENT:  Left carpal tunnel release on 12/30/2018.    PLAN:  Ms. Stangl is doing well.  We removed her sutures today.  She will increase her activities as tolerated.  She will return to see me in 1 month if she is having any problems or concerns; otherwise, they will see me on as-needed basis.  She will contact me if she is interested in a formal referral for hand therapy though at this point she does not think is necessary.

## 2019-01-14 NOTE — Unmapped (Signed)
COVID PRE TEST needed. Surgery 9/29. COVID PRE TEST 9/26 @9 :00AM [id number] [employer]- confirmed with patient--phd

## 2019-01-29 DIAGNOSIS — M329 Systemic lupus erythematosus, unspecified: Secondary | ICD-10-CM

## 2019-01-29 MED ORDER — BELIMUMAB 200 MG/ML SUBCUTANEOUS AUTO-INJECTOR
SUBCUTANEOUS | 0 refills | 28.00000 days | Status: CP
Start: 2019-01-29 — End: 2019-02-21
  Filled 2019-02-04: qty 4, 28d supply, fill #0

## 2019-01-29 NOTE — Unmapped (Signed)
[facility] Clinical Assessment & Refill Coordination Note    NAZLI PENN, DOB: 1976/05/20  Phone: [phone] (home)     All above HIPAA information was verified with patient.     Specialty Medication(s):   Inflammatory Disorders: Benlysta     Current Outpatient Medications   Medication Sig Dispense Refill   ??? acetaminophen (TYLENOL) 500 MG tablet Take 1,000 mg by mouth.     ??? belimumab 200 mg/mL AtIn Inject the contents of 1 syringe (200 mg) under the skin every seven (7) days. (Patient not taking: Reported on 11/28/2018) 4 mL 0   ??? CONTRAVE 8-90 mg TbER TAKE 2 TABLETS BY MOUTH TWICE DAILY 240 tablet 0   ??? empty container Misc Use as directed 1 each PRN   ??? famotidine (PEPCID) 40 MG tablet TAKE 1 TABLET(40 MG) BY MOUTH EVERY NIGHT 90 tablet 3   ??? hydroxychloroquine (PLAQUENIL) 200 mg tablet Take 1 tablet (200 mg total) by mouth Two (2) times a day. 60 tablet 1   ??? ibuprofen (ADVIL,MOTRIN) 400 MG tablet Take 1 tablet (400 mg total) by mouth every six (6) hours as needed for pain. 30 tablet 5   ??? methylPREDNISolone (MEDROL DOSEPACK) 4 mg tablet Follow package directions inside box on the back of tablet insert (Patient not taking: Reported on 12/30/2018) 21 tablet 0     No current facility-administered medications for this visit.         Changes to medications: Shatisha reports no changes at this time.    Allergies   Allergen Reactions   ??? Aspirin Swelling     Other reaction(s): SWELLING/EDEMA   ??? Fish Containing Products Diarrhea and Nausea And Vomiting   ??? Iodine Other (See Comments)     Pt told not to have d/t shellfish allergy   ??? Shellfish Containing Products Hives, Diarrhea and Nausea Only     Other reaction(s): HIVES  Other reaction(s): HIVES  Other reaction(s): NAUSEA       Changes to allergies: No    SPECIALTY MEDICATION ADHERENCE     Benlysta 200mg /ml: 7 days of medicine on hand     Medication Adherence    Patient reported X missed doses in the last month: 0  Specialty Medication: Benlysta 200mg /ml          Specialty medication(s) dose(s) confirmed: Regimen is correct and unchanged.     Are there any concerns with adherence? No    Adherence counseling provided? Not needed    CLINICAL MANAGEMENT AND INTERVENTION      Clinical Benefit Assessment:    Do you feel the medicine is effective or helping your condition? Yes    Clinical Benefit counseling provided? Not needed    Adverse Effects Assessment:    Are you experiencing any side effects? No    Are you experiencing difficulty administering your medicine? Yes, patient reports mild discomfort with injections. Medication administration counseling provided: we discussed using an ice pack on her injection site breifly prior to injection or potentially an OTC NSAID to alleviate any temporary injection discomfort.    Quality of Life Assessment:    How many days over the past month did your SLE keep you from your normal activities? For example, brushing your teeth or getting up in the morning. Patient declined to answer    Have you discussed this with your provider? Not needed    Therapy Appropriateness:    Is therapy appropriate? Yes, therapy is appropriate and should be  continued    DISEASE/MEDICATION-SPECIFIC INFORMATION      For patients on injectable medications: Patient currently has 1 doses left.  Next injection is scheduled for 01/30/2019.    PATIENT SPECIFIC NEEDS     ? Does the patient have any physical, cognitive, or cultural barriers? No    ? Is the patient high risk? No     ? Does the patient require a Care Management Plan? No     ? Does the patient require physician intervention or other additional services (i.e. nutrition, smoking cessation, social work)? No      SHIPPING     Specialty Medication(s) to be Shipped:   Inflammatory Disorders: Benlysta 200mg /ml    Other medication(s) to be shipped: none       Changes to insurance: No    Delivery Scheduled: Yes, Expected medication delivery date: 02/04/2019.  However, Rx request for refills was sent to the provider as there are none remaining.     Medication will be delivered via Same Day Courier to the confirmed home address in [facility].    The patient will receive a drug information handout for each medication shipped and additional FDA Medication Guides as required.  Verified that patient has previously received a Conservation officer, historic buildings.    All of the patient's questions and concerns have been addressed.    Karene Fry Vayden Weinand   [facility] Specialty Pharmacist

## 2019-02-04 ENCOUNTER — Ambulatory Visit
Admit: 2019-02-04 | Discharge: 2019-02-05 | Payer: PRIVATE HEALTH INSURANCE | Attending: Family Medicine | Primary: Family Medicine

## 2019-02-04 DIAGNOSIS — K7460 Unspecified cirrhosis of liver: Secondary | ICD-10-CM

## 2019-02-04 DIAGNOSIS — K7581 Nonalcoholic steatohepatitis (NASH): Secondary | ICD-10-CM

## 2019-02-04 DIAGNOSIS — Z0000 Encounter for general adult medical examination without abnormal findings: Secondary | ICD-10-CM

## 2019-02-04 DIAGNOSIS — K219 Gastro-esophageal reflux disease without esophagitis: Secondary | ICD-10-CM

## 2019-02-04 MED ORDER — BUPROPION HCL XL 150 MG 24 HR TABLET, EXTENDED RELEASE
ORAL_TABLET | Freq: Every morning | ORAL | 2 refills | 30.00000 days | Status: CP
Start: 2019-02-04 — End: 2020-02-04

## 2019-02-04 MED ORDER — FAMOTIDINE 40 MG TABLET
ORAL_TABLET | Freq: Every evening | ORAL | 3 refills | 90 days | Status: CP | PRN
Start: 2019-02-04 — End: ?

## 2019-02-04 MED FILL — BENLYSTA 200 MG/ML SUBCUTANEOUS AUTO-INJECTOR: 28 days supply | Qty: 4 | Fill #0 | Status: AC

## 2019-02-04 NOTE — Unmapped (Signed)
Administered flu vaccine. Patient tolerated well. No observed difficulties. VIS sheet given

## 2019-02-04 NOTE — Unmapped (Signed)
Following with [facility] and working on weight loss through our clinic.  Getting [place] q20mo for tumor surveillance as well as LFTs.  Asked her to get back in with GI for annual visit.

## 2019-02-04 NOTE — Unmapped (Signed)
Discussed importance of diet, exercise, and weight loss in mitigating adverse health-related outcomes. Recommend the following:  - Calorie counting and restriction.  - Daily weight checks and weight loss goal of 0.5-1lb/week.  - Exercise regimen of adequate intensity and duration.    Nutrition referral - seen in the past by Lana; provided information for our weight clinic.  Pharmacotherapy - previously on contrave BID (hepatic dosing)  Bariatrics - not discussed    Body mass index is 38.21 kg/m??.  Wt Readings from Last 6 Encounters:   02/04/19 91.7 kg (202 lb 1.9 oz)   12/30/18 90.3 kg (199 lb 1.2 oz)   12/06/18 90.5 kg (199 lb 8.3 oz)   11/28/18 92 kg (202 lb 14.4 oz)   11/11/18 90.7 kg (200 lb)   10/24/18 90.9 kg (200 lb 6.4 oz)

## 2019-02-04 NOTE — Unmapped (Signed)
Assessment/Plan:         Problem List Items Addressed This Visit     Morbid obesity due to excess calories (CMS-HCC)     Discussed importance of diet, exercise, and weight loss in mitigating adverse health-related outcomes. Recommend the following:  - Calorie counting and restriction.  - Daily weight checks and weight loss goal of 0.5-1lb/week.  - Exercise regimen of adequate intensity and duration.    Nutrition referral - seen in the past by Lana; provided information for our weight clinic.  Pharmacotherapy - previously on contrave BID (hepatic dosing)  Bariatrics - not discussed    Body mass index is 38.21 kg/m??.  Wt Readings from Last 6 Encounters:   02/04/19 91.7 kg (202 lb 1.9 oz)   12/30/18 90.3 kg (199 lb 1.2 oz)   12/06/18 90.5 kg (199 lb 8.3 oz)   11/28/18 92 kg (202 lb 14.4 oz)   11/11/18 90.7 kg (200 lb)   10/24/18 90.9 kg (200 lb 6.4 oz)            Liver cirrhosis secondary to NASH (nonalcoholic steatohepatitis) (CMS-HCC)     Following with [facility] and working on weight loss through our clinic.  Getting [place] q95mo for tumor surveillance as well as LFTs.  Asked her to get back in with GI for annual visit.         Relevant Orders    Ambulatory referral to Hepatology    Gastroesophageal reflux disease     - Continue Pepcid 40 mg qhs prn, which has been effective.         Relevant Medications    famotidine (PEPCID) 40 MG tablet    Annual physical exam - Primary     Age appropriate preventative care, health maintenance, and anticipatory guidance discussed/handout provided.  Immunizations:  ?? Influenza vaccine - administered today.  ?? Pneumococcal Polysaccharide 23 - 06/17/2008  ?? TdaP - 06/23/2011  Ordered future labs as below.         Relevant Orders    Lipid Panel    Hemoglobin A1c          I have reviewed and addressed the patient???s adherence and response to prescribed medications. I have identified patient barriers to following the proposed medication and treatment plan, and have noted opportunities to optimize healthy behaviors. I have answered the patient???s questions to satisfaction and the patient voices understanding.      Return in about 1 year (around 02/04/2020) for Annual physical.       Subjective:            Patient ID: Tonya Brady is a 43 y.o. female who presents for follow up of the following:  Informant: Patient came to appointment alone.    Chief Complaint   Patient presents with   ??? Annual Exam       HPI    Physical exam  Pt overall healthy and well. She is working on weight loss and has recently restarted exercising. No acute complaints or concerns today. Taking Metamucil BID to regulate bowel movements. No tobacco or drug use. No alcohol abuse. Follows with dentist and ophthalmology. Denies chest pain, palpitations, dyspnea, abdominal pain, inconsistent bowels, urinary symptoms, paresthesias, edema, rash.    NAFLD with advanced fibrosis/cirrhosis  Getting [place] q51mo for tumor surveillance as well as LFTs. Following with [facility]. Scheduled for upper endoscopy 08/20/2018 for variceal screening. Last US abdomen 06/17/2018 with heterogeneous nodular liver which can be seen in chronic liver disease.  No focal hepatic masses. Note that MRI is more sensitive for detection of hepatic masses. Has not seen GI since 04/2016  ??  SLE   Pt currently taking Plaquenil 200 mg BID with famotidine. Also taking Benlysta 200 mg injections weekly. Endorses improved energy levels since starting Benlysta, but has also restarted exercising recently. XR left and right hand on 12/31/2017 revealed nondisplaced fracture of the distal tuft of the left first distal phalanx. No evidence of erosive or inflammatory arthropathy. 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 Following with rheumatology.    MDD/GAD  Pt stable after switching wellbutrin to contrave for weight loss  PHQ-9 PHQ-9 TOTAL SCORE   06/03/2018 3   02/19/2017 0   08/09/2016 1   07/07/2016 10       Morbid obesity  Pt has been struggling to maintain exercise routine since her daughter and grandchildren moved in. She is adhering to diet recs from our nutritionist. No longer taking contrave 2 tabs BID x 3 weeks which was helping with appetite. Following with GI. Planning to enroll in Dr. Paulla Dolly weight clinic.  Breakfast - cheerios/yogurt/fruit or scrambled eggs  Lunch - salad with low fat [place] or wrap  Dinner - grilled chicken breast or pork chop with vegetables  Snacks - grapes, apples, yogurt, triscuits/cheese. Snacks once/day  Portions - normal, no seconds  Drinks - has replaced sugary drinks with diet alternatives, unsweet tea, and water  Exercise - walks outside everyday for 3 miles in . Gets HR up to 120s  Wt Readings from Last 6 Encounters:   02/04/19 91.7 kg (202 lb 1.9 oz)   12/30/18 90.3 kg (199 lb 1.2 oz)   12/06/18 90.5 kg (199 lb 8.3 oz)   11/28/18 92 kg (202 lb 14.4 oz)   11/11/18 90.7 kg (200 lb)   10/24/18 90.9 kg (200 lb 6.4 oz)       Carpal tunnel, left  S/p left carpal tunnel release 12/30/2018. She endorses some residual swelling. C/w deQuervain tenosynovitis per rheumatology. Recommended to wear thumb splints.    GERD - symptoms well controlled on Pepcid 40 mg qhs prn.    ROS  Comprehensive ROS negative except as per HPI    Outpatient Medications Prior to Visit   Medication Sig Dispense Refill   ??? belimumab 200 mg/mL AtIn Inject the contents of 1 syringe (200 mg) under the skin every seven (7) days. 4 mL 0   ??? empty container Misc Use as directed 1 each PRN   ??? hydroxychloroquine (PLAQUENIL) 200 mg tablet Take 1 tablet (200 mg total) by mouth Two (2) times a day. 60 tablet 1   ??? acetaminophen (TYLENOL) 500 MG tablet Take 1,000 mg by mouth.     ??? CONTRAVE 8-90 mg TbER TAKE 2 TABLETS BY MOUTH TWICE DAILY 240 tablet 0   ??? famotidine (PEPCID) 40 MG tablet TAKE 1 TABLET(40 MG) BY MOUTH EVERY NIGHT 90 tablet 3   ??? ibuprofen (ADVIL,MOTRIN) 400 MG tablet Take 1 tablet (400 mg total) by mouth every six (6) hours as needed for pain. 30 tablet 5   ??? methylPREDNISolone (MEDROL DOSEPACK) 4 mg tablet Follow package directions inside box on the back of tablet insert (Patient not taking: Reported on 12/30/2018) 21 tablet 0     No facility-administered medications prior to visit.        The following portions of the patient's history were reviewed and updated as appropriate: allergies, current medications, past medical  history, past social history and problem list.          Objective:            Vital Signs  BP 110/71  - Pulse 56  - Temp 36.8 ??C (98.2 ??F) (Temporal)  - Resp 16  - Ht 154.9 cm (5' 0.98)  - Wt 91.7 kg (202 lb 1.9 oz)  - BMI 38.21 kg/m??      Exam  General appearance: alert, cooperative, NAD   Head: normocephalic and atraumatic  Eyes: normal EOMs, PERRL, normal conjunctiva, no discharge  ENT: OP clear and moist, no tonsillar exudates or erythema, normal TMs and external ear canals, normal nasal passages  Neck: normal ROM, supple, no thyromegaly, no cervical/supraclavicular lymphadenopathy  Lungs: normal work of breathing, good air entry and clear to auscultation bilaterally, no wheezes or crackles appreciated   Heart: regular rate and rhythm, S1, S2 normal, no murmur, click, rub or gallop   Abdomen: Abdomen soft, non-tender, non distended. No masses, no organomegaly. Bowel sounds present.  Extremities: extremities normal, atraumatic, no cyanosis or edema  NEURO: AAOx3, appropriately responds to questions, spontaneous movement of extremities  Psych: normal mood and affect       I attest that I, Pollyann Savoy, personally documented this note while acting as scribe for Alexandria Lodge, MD.      Pollyann Savoy, Scribe.  02/04/2019     The documentation recorded by the scribe accurately reflects the service I personally performed and the decisions made by me.    Alexandria Lodge, MD

## 2019-02-04 NOTE — Unmapped (Addendum)
Age appropriate preventative care, health maintenance, and anticipatory guidance discussed/handout provided.  Immunizations:  ?? Influenza vaccine - administered today.  ?? Pneumococcal Polysaccharide 23 - 06/17/2008  ?? TdaP - 06/23/2011  Ordered future labs as below.

## 2019-02-04 NOTE — Unmapped (Signed)
-   Continue Pepcid 40 mg qhs prn, which has been effective.

## 2019-02-10 ENCOUNTER — Telehealth
Admit: 2019-02-10 | Discharge: 2019-02-11 | Payer: PRIVATE HEALTH INSURANCE | Attending: Internal Medicine | Primary: Internal Medicine

## 2019-02-10 DIAGNOSIS — M329 Systemic lupus erythematosus, unspecified: Secondary | ICD-10-CM

## 2019-02-10 DIAGNOSIS — K111 Hypertrophy of salivary gland: Secondary | ICD-10-CM

## 2019-02-10 DIAGNOSIS — M3500 Sicca syndrome, unspecified: Secondary | ICD-10-CM

## 2019-02-10 MED ORDER — HYDROXYCHLOROQUINE 200 MG TABLET
ORAL_TABLET | Freq: Two times a day (BID) | ORAL | 1 refills | 30.00000 days | Status: CP
Start: 2019-02-10 — End: 2019-02-11

## 2019-02-10 NOTE — Unmapped (Signed)
RHEUMATOLOGY CLINIC FOLLOW-UP NOTE      Assessment/Plan:      SLE   Sjogrens  Bilateral parotid enlargement - See HPI, but briefly: 2005 diagnosed with Sjogren's syndrome charaterized by sicca symptoms, parotid gland enlargement, (+)ANA, high titer RF/SSA/B, cytopenias. She had followed with a cardiologist for possible related pericarditis as well. 2011 overlap/evolving SLE diagnosis when she presented with malar rash. She has been maintained on multiple prednisone tapers for parotid enlargement/arthritis, MTX [discontinued 2/2 GI side effects], azathioprine [self-discontinued], & HCQ. She has now been on belimumab for 5 doses with difficult elucidation of improvement in symptoms. Since her last visit she underwent R parotid endoscopy with clearance of duct debris & steroid injection which she notes has significantly improved her symptoms. It will be interesting to evaluate if parotid enlargement/discomfort which is long-standing in nature improves with initiation of belimumab based on pathophysiology.  -- Immunosuppression: cont belimumab, HCQ 200mg  BID  -- Labs: C3/4, CBC diff, SPEP with IF, dsDNA UA/UPC pending; reviewed sCr 11/2018 wnl  -- Sicca: cont conservative measures  -- Imaging: reviewed CT 10/2018: fatty atrophy of bilateral parotid/submandibular glands, diffuse nodularity of parotid glands. No suspicion masses or lymphadenopathy.  -- Retinopathy: Recommended ophthalmology follow-up; last eval 07/2017    RTC in [place] in about 6 months (around 08/10/2019), or with Tyreshia Ingman, for with Jaydeen Darley.    This patient was seen and discussed with Dr. Olean Ree who agrees with the plan outlined above.    I spent 3 minutes on the video with the patient and 17 minutes on the phone with the patient. I spent an additional 5 minutes on pre- and post-visit activities.     The patient was physically located in [place] or a state in which I am permitted to provide care. The patient and/or parent/guardian understood that s/he may incur co-pays and cost sharing, and agreed to the telemedicine visit. The visit was reasonable and appropriate under the circumstances given the patient's presentation at the time.    The patient and/or parent/guardian has been advised of the potential risks and limitations of this mode of treatment (including, but not limited to, the absence of in-person examination) and has agreed to be treated using telemedicine. The patient's/patient's family's questions regarding telemedicine have been answered.     If the visit was completed in an ambulatory setting, the patient and/or parent/guardian has also been advised to contact their provider???s office for worsening conditions, and seek emergency medical treatment and/or call 911 if the patient deems either necessary.    Subjective:   Primary Care Provider: Alexandria Lodge, MD    HPI:  Tonya Brady is a 43 y.o.  female with a PMH of SLE & secondary Sjogren's with disease characterized by long-standing bilateral parotid enlargement, arthritis, ACLE, (+)ANA/SSA/SSB, RF(+) presenting for follow-up. Last seen in clinic 07/2018 at which time she was continued on HCQ with initiation of belimumab. Since last visit patient has had 5 belimumab injections without clearly noted improvement. She notes that her energy may be improving although this could also possibly be related to increased exercise/walking efforts. Notes at the end of the day she continues to have swelling in her hands/wrists particularly with use [crafts, cooking]. Underwent R parotid gland sialendoscopy on 12/06/2018 which she reports has significantly improved her salivary movement with plan for upcoming scopy of L side. Duct was cleared of debris with saline irrigation & steroid injection into duct. Underwent L carpal tunnel repair on 12/30/2018.    Disease History:  --  2005 presented with bilateral partoid swelling, sicca symptoms, & history of pancytopenia. At time of presentation she was hospitalized with a small pericardial effusion that resolved on monitoring.  Evaluation at that time was concenring for (+)ANA 1:320 speckled, (+)SSA, (+)SSB, RF 120, (-)dsDNA. Initiated HCQ.  -- 2007 - 2010 continued PRN prednisone for parotid swelling. Concurrent intermittent antibiotics for concern for concurrent infections. Evaluated in ENT.  -- 2009 trial of MTX for degree of parotid swelling. Hospitalized for abdominal pain & elevated LFTs. MTX discontinued. After discontinuation of MTX developed worsening joint pain & swelling of hands.   -- 2011 developed ACLE/malar rash after patient had discontinued HCQ. At that time labs were concerning for ANA 1:640 speckled, dsDNA(-). Restarted HCQ.  -- 2012 concern for worsening arthritis pain. Initiated azathioprine while continued HCQ.  -- 2013 patient self-discontinued medications for nausea  -- 2014 re-presented with malar & anterior throat rash for which HCQ was restarted  -- 2015-2017 lost to follow-up. Restarted HCQ. Concern for elevated LFTs for which she was evaluated by GI & underwent liver biopsy which confirmed NAFLD/fibrosis.  -- 2018 - 2019 continued on HCQ. Concern for active synovitis on exam promtping steroid taper.   -- 2020 belimumab prescribed for persistent arthralgias, new rash.    Treatment:  -- Belimumab [2020 - current]  -- HCQ [2005 - current; intermittent lapses in medication use]  -- MTX [2009] - discontinued 2/2 severe GI side effects  -- Azathioprine [2012 - 2013]  -- Multiple prednisone tapers    Further ROS as below:    Review of Systems   Constitutional: Negative for chills, fatigue and fever.   HENT: Positive for congestion. Negative for mouth sores (No nasal or mouth sores.), sore throat (mild ) and trouble swallowing.         (+) dry mouth - Improved   Eyes: Negative for pain, redness and visual disturbance.        (+) eye dryness - tolerating contacts   Respiratory: Negative for cough and shortness of breath.    Cardiovascular: Negative for chest pain.   Gastrointestinal: Negative for abdominal pain, blood in stool (No melena or hematochezia.), diarrhea, nausea and vomiting.   Endocrine: Negative for polyuria.   Genitourinary: Negative for dysuria and hematuria.   Musculoskeletal: Positive for arthralgias.   Skin: Negative for rash.        Light malar rash reported over nose - stable   History of raynauds  No changes in nails.   Allergic/Immunologic:        No recurrent infections or known immune deficiency.   Neurological: Negative for numbness and headaches (intermittent).   Hematological: Negative for adenopathy.     PMH, PFH, PSH as previously documented.  Medications were reviewed this visit.    Objective:   There were no vitals filed for this visit.  There is no height or weight on file to calculate BMI.    Physical Exam  N/A - Telephone visit today    Test Results  No visits with results within 4 Week(s) from this visit.   Latest known visit with results is:   Office Visit on 12/28/2018   Component Date Value   ??? SARS-CoV-2 PCR 12/28/2018 Not Detected

## 2019-02-11 MED ORDER — HYDROXYCHLOROQUINE 200 MG TABLET
ORAL_TABLET | Freq: Two times a day (BID) | ORAL | 1 refills | 30.00000 days | Status: CP
Start: 2019-02-11 — End: ?

## 2019-02-13 ENCOUNTER — Ambulatory Visit: Admit: 2019-02-13 | Discharge: 2019-02-14 | Payer: PRIVATE HEALTH INSURANCE

## 2019-02-13 DIAGNOSIS — M329 Systemic lupus erythematosus, unspecified: Secondary | ICD-10-CM

## 2019-02-13 DIAGNOSIS — K111 Hypertrophy of salivary gland: Secondary | ICD-10-CM

## 2019-02-13 DIAGNOSIS — Z0000 Encounter for general adult medical examination without abnormal findings: Secondary | ICD-10-CM

## 2019-02-13 DIAGNOSIS — M3500 Sicca syndrome, unspecified: Secondary | ICD-10-CM

## 2019-02-13 LAB — CBC W/ AUTO DIFF
BASOPHILS ABSOLUTE COUNT: 0 10*9/L (ref 0.0–0.1)
BASOPHILS RELATIVE PERCENT: 0.3 %
EOSINOPHILS ABSOLUTE COUNT: 0.1 10*9/L (ref 0.0–0.4)
EOSINOPHILS RELATIVE PERCENT: 2 %
HEMATOCRIT: 41.2 % (ref 36.0–46.0)
LARGE UNSTAINED CELLS: 2 % (ref 0–4)
LYMPHOCYTES ABSOLUTE COUNT: 0.8 10*9/L — ABNORMAL LOW (ref 1.5–5.0)
LYMPHOCYTES RELATIVE PERCENT: 21.1 %
MEAN CORPUSCULAR HEMOGLOBIN CONC: 33.4 g/dL (ref 31.0–37.0)
MEAN CORPUSCULAR HEMOGLOBIN: 30.4 pg (ref 26.0–34.0)
MEAN CORPUSCULAR VOLUME: 91 fL (ref 80.0–100.0)
MEAN PLATELET VOLUME: 8 fL (ref 7.0–10.0)
MONOCYTES ABSOLUTE COUNT: 0.2 10*9/L (ref 0.2–0.8)
MONOCYTES RELATIVE PERCENT: 6.2 %
NEUTROPHILS ABSOLUTE COUNT: 2.7 10*9/L (ref 2.0–7.5)
NEUTROPHILS RELATIVE PERCENT: 68.4 %
PLATELET COUNT: 144 10*9/L — ABNORMAL LOW (ref 150–440)
RED BLOOD CELL COUNT: 4.53 10*12/L (ref 4.00–5.20)
RED CELL DISTRIBUTION WIDTH: 13.6 % (ref 12.0–15.0)
WBC ADJUSTED: 3.9 10*9/L — ABNORMAL LOW (ref 4.5–11.0)

## 2019-02-13 LAB — MEAN CORPUSCULAR HEMOGLOBIN: Lab: 30.4

## 2019-02-13 LAB — DSDNA ANTIBODY: Lab: NEGATIVE

## 2019-02-13 LAB — LIPID PANEL
CHOLESTEROL/HDL RATIO SCREEN: 4 (ref <5.0)
CHOLESTEROL: 148 mg/dL (ref 100–199)
LDL CHOLESTEROL CALCULATED: 92 mg/dL (ref 60–99)
NON-HDL CHOLESTEROL: 111 mg/dL
TRIGLYCERIDES: 93 mg/dL (ref 1–149)
VLDL CHOLESTEROL CAL: 18.6 mg/dL (ref 9–37)

## 2019-02-13 LAB — URINALYSIS
BILIRUBIN UA: NEGATIVE
BLOOD UA: NEGATIVE
GLUCOSE UA: NEGATIVE
KETONES UA: NEGATIVE
LEUKOCYTE ESTERASE UA: NEGATIVE
PH UA: 5 (ref 5.0–9.0)
PROTEIN UA: NEGATIVE
SPECIFIC GRAVITY UA: 1.024 (ref 1.005–1.040)
UROBILINOGEN UA: 0.2
WBC UA: 0 /HPF (ref 0–5)

## 2019-02-13 LAB — C3 COMPLEMENT: Complement C3:MCnc:Pt:Ser/Plas:Qn:: 87 — ABNORMAL LOW

## 2019-02-13 LAB — BLOOD UA: Hemoglobin:PrThr:Pt:Urine:Ord:Test strip: NEGATIVE

## 2019-02-13 LAB — C4 COMPLEMENT: Complement C4:MCnc:Pt:Ser/Plas:Qn:: 21.9

## 2019-02-13 LAB — ESTIMATED AVERAGE GLUCOSE: Estimated average glucose:MCnc:Pt:Bld:Qn:Estimated from glycated hemoglobin: 108

## 2019-02-13 LAB — PROTEIN / CREATININE RATIO, URINE: CREATININE, URINE: 316.9 mg/dL

## 2019-02-13 LAB — NON-HDL CHOLESTEROL: Cholesterol.non HDL:MCnc:Pt:Ser/Plas:Qn:: 111

## 2019-02-13 LAB — HEMOGLOBIN A1C: HEMOGLOBIN A1C: 5.4 % (ref 4.8–5.6)

## 2019-02-13 LAB — PROTEIN/CREAT RATIO, URINE: Protein/Creatinine:MRto:Pt:Urine:Qn:: 0.026

## 2019-02-14 LAB — SERUM PROTEIN ELECTROPHORESIS AND IMMUNOFIXATION
ALPHA-1 GLOBULIN: 0.2 g/dL (ref 0.2–0.5)
BETA-1 GLOBULIN: 0.5 g/dL (ref 0.3–0.6)
BETA-2 GLOBULIN: 0.4 g/dL (ref 0.2–0.6)
GAMMAGLOBULIN: 1.5 g/dL (ref 0.5–1.5)
PROTEIN TOTAL: 7.3 g/dL (ref 6.5–8.3)

## 2019-02-14 LAB — SPE INTERPRETATION: Lab: 0

## 2019-02-15 ENCOUNTER — Ambulatory Visit: Admit: 2019-02-15 | Discharge: 2019-02-16 | Payer: PRIVATE HEALTH INSURANCE

## 2019-02-15 DIAGNOSIS — Z20828 Contact with and (suspected) exposure to other viral communicable diseases: Secondary | ICD-10-CM

## 2019-02-15 DIAGNOSIS — Z1159 Encounter for screening for other viral diseases: Secondary | ICD-10-CM

## 2019-02-15 DIAGNOSIS — R0989 Other specified symptoms and signs involving the circulatory and respiratory systems: Secondary | ICD-10-CM

## 2019-02-15 DIAGNOSIS — Z01812 Encounter for preprocedural laboratory examination: Secondary | ICD-10-CM

## 2019-02-15 NOTE — Unmapped (Signed)
COVID Pre-Procedure Intake Form     Assessment     Tonya Brady is a 43 y.o. female presenting to [facility] for COVID testing.     Plan     If no testing performed, pt counseled on routine care for respiratory illness.  If testing performed, COVID sent.  Patient directed to Home given findings during today's visit.    Subjective     Tonya Brady is a 43 y.o. female who [facility] the [facility] with complaints of the following:    Exposure History: In the last 21 days?     Have you traveled outside of [place]? No               Have you been in close contact with someone confirmed by a test to have COVID? (Close contact is within 6 feet for at least 10 minutes) No       Have you worked in a health care facility? No     Lived or worked facility like a nursing home, group home, or assisted living?    No         Are you scheduled to have surgery or a procedure in the next 3 days? Yes               Are you scheduled to receive cancer chemotherapy within the next 7 days?    No     Have you ever been tested before for COVID-19 with a swab of your nose? Yes: When: 12/2018, Where: Unknown   Are you a healthcare worker being tested so to return to work No     Right now,  do you have any of the following that developed over the past 7 days (as stated by patient on intake form):    Subjective fever (felt feverish) No   Chills (especially repeated shaking chills) No   Severe fatigue (felt very tired) No   Muscle aches No   Runny nose Yes, how many days? 2   Sore throat No   Loss of taste or smell No   Cough (new onset or worsening of chronic cough) No   Shortness of breath No   Nausea or vomiting No   Headache No   Abdominal Pain No   Diarrhea (3 or more loose stools in last 24 hours) No       Scribe's Attestation: Lolita Lenz, MD obtained and performed the history, physical exam and medical decision making elements that were entered into the chart.  Signed by Victorino Sparrow serving as Scribe, on 02/15/2019 9:59 AM

## 2019-02-17 DIAGNOSIS — Z9049 Acquired absence of other specified parts of digestive tract: Secondary | ICD-10-CM

## 2019-02-17 DIAGNOSIS — M329 Systemic lupus erythematosus, unspecified: Secondary | ICD-10-CM

## 2019-02-17 DIAGNOSIS — K219 Gastro-esophageal reflux disease without esophagitis: Secondary | ICD-10-CM

## 2019-02-17 DIAGNOSIS — Z596 Low income: Secondary | ICD-10-CM

## 2019-02-17 DIAGNOSIS — K1120 Sialoadenitis, unspecified: Secondary | ICD-10-CM

## 2019-02-17 DIAGNOSIS — R609 Edema, unspecified: Secondary | ICD-10-CM

## 2019-02-17 DIAGNOSIS — M3500 Sicca syndrome, unspecified: Secondary | ICD-10-CM

## 2019-02-17 DIAGNOSIS — M797 Fibromyalgia: Secondary | ICD-10-CM

## 2019-02-18 ENCOUNTER — Ambulatory Visit: Admit: 2019-02-18 | Discharge: 2019-02-18 | Payer: PRIVATE HEALTH INSURANCE

## 2019-02-18 ENCOUNTER — Encounter
Admit: 2019-02-18 | Discharge: 2019-02-18 | Payer: PRIVATE HEALTH INSURANCE | Attending: Student in an Organized Health Care Education/Training Program | Primary: Student in an Organized Health Care Education/Training Program

## 2019-02-18 DIAGNOSIS — Z596 Low income: Secondary | ICD-10-CM

## 2019-02-18 DIAGNOSIS — Z9049 Acquired absence of other specified parts of digestive tract: Secondary | ICD-10-CM

## 2019-02-18 DIAGNOSIS — K1120 Sialoadenitis, unspecified: Secondary | ICD-10-CM

## 2019-02-18 DIAGNOSIS — M797 Fibromyalgia: Secondary | ICD-10-CM

## 2019-02-18 DIAGNOSIS — R609 Edema, unspecified: Secondary | ICD-10-CM

## 2019-02-18 DIAGNOSIS — M329 Systemic lupus erythematosus, unspecified: Secondary | ICD-10-CM

## 2019-02-18 DIAGNOSIS — K219 Gastro-esophageal reflux disease without esophagitis: Secondary | ICD-10-CM

## 2019-02-18 DIAGNOSIS — M3500 Sicca syndrome, unspecified: Secondary | ICD-10-CM

## 2019-02-18 MED ORDER — METHYLPREDNISOLONE 4 MG TABLETS IN A DOSE PACK
ORAL_TABLET | Freq: Every day | ORAL | 0 refills | 21.00000 days | Status: CP
Start: 2019-02-18 — End: 2020-02-18
  Filled 2019-02-18: qty 21, 6d supply, fill #0

## 2019-02-18 MED ORDER — ONDANSETRON 4 MG DISINTEGRATING TABLET
ORAL_TABLET | Freq: Three times a day (TID) | ORAL | 0 refills | 7.00000 days | Status: CP | PRN
Start: 2019-02-18 — End: 2019-02-18

## 2019-02-18 MED ORDER — OXYCODONE 5 MG TABLET
ORAL_TABLET | ORAL | 0 refills | 2.00000 days | Status: CP | PRN
Start: 2019-02-18 — End: 2019-02-21
  Filled 2019-02-18: qty 10, 1d supply, fill #0

## 2019-02-18 MED ORDER — PROMETHAZINE 12.5 MG TABLET
ORAL_TABLET | Freq: Four times a day (QID) | ORAL | 0 refills | 4.00000 days | Status: CP | PRN
Start: 2019-02-18 — End: 2019-02-25
  Filled 2019-02-18: qty 15, 3d supply, fill #0

## 2019-02-18 MED FILL — METHYLPREDNISOLONE 4 MG TABLETS IN A DOSE PACK: 6 days supply | Qty: 21 | Fill #0 | Status: AC

## 2019-02-18 MED FILL — OXYCODONE 5 MG TABLET: 1 days supply | Qty: 10 | Fill #0 | Status: AC

## 2019-02-18 MED FILL — PROMETHAZINE 12.5 MG TABLET: 3 days supply | Qty: 15 | Fill #0 | Status: AC

## 2019-02-18 NOTE — Unmapped (Signed)
Otolaryngology Post-Operative Visit Note      Reason for Visit:  Post-op     History of Present Illness    The patient is a 43 year old who presents for follow-up s/p sialendoscopy of the right gland. Patient is doing well. Feels like saliva gland has improved. Fullness has resolved. Getting more saliva. Interested in doing the other side.     The patient denies fevers, chills, shortness of breath, chest pain, nausea, vomiting, diarrhea, inability to lie flat, dysphagia, odynophagia, hemoptysis, hematemesis, changes in vision, changes in voice quality, otalgia, otorrhea, vertiginous symptoms, focal deficits, or other concerning symptoms.    Review of Systems    A 12 system review of systems was performed and is negative other than that noted in the history of present illness.    Vital Signs  not currently breastfeeding.        Physical Exam    General: Well-developed, well-nourished. Appropriate, comfortable, and in no apparent distress.  Voice: clear   Head/Face: On external examination there is no obvious asymmetry or scars. On palpation there is no tenderness over maxillary sinuses or masses within the salivary glands. Firmness over the right parotid gland is improved significantly. Able to get saliva out easily. Cranial nerves V and VII are intact through all distributions.  Eyes: PERRL, EOMI, the conjunctiva are not injected and sclera is non-icteric.  Ears:   Right ear: On external exam, there is no obvious lesions or asymmetry. Hearing is grossly intact.  Left ear: On external exam, there is no obvious lesions or asymmetry. Hearing is grossly intact.  Nose: No external masses, lesions, or deformity. Inspection of nasal mucosa, septum, and turbinates is normal to anterior rhinoscopy.   Oral cavity/oropharynx: The mucosa of the lips, gums, hard and soft palate, posterior pharyngeal wall, tongue, floor of mouth, and buccal region are without masses or lesions and are normally hydrated. Good dentition. Tongue protrudes midline. Tonsils are symmetric without any masses or lesions. Supraglottis not visualized due to gag reflex. The duct is well dilated and able to get saliva.   Neck: There is no asymmetry or masses. Trachea is midline. There is no enlargement of the thyroid or palpable thyroid nodules.   Lymphatics: There is no palpable lymphadenopathy along the jugulodiagastric, submental, or posterior cervical chains.  Chest: No audible wheeze, unlabored respirations.  Neurologic: Cranial nerve???s II-XII are grossly intact. Exam is non-focal.  Extremities: No cyanosis, clubbing or edema.        Assessment/Recommendations:    The patient is a 43 year old femal s/p sialendoscopy. Help significantly - by exam and per patient reports. Will do the left side given improvement. Discussed the risk, benefits and alternatives including but not limited to the risks of bleeding, pain, swelling, infection, dry mouth, numbness of the tongue, duct stenosis, gland or duct perforation or return of symptoms. Discussed that patients often have worse swelling after surgery for a week. Patient wishes to proceed with surgery.     The patient voiced complete understanding of plan as detailed above and is in full agreement.

## 2019-02-18 NOTE — Unmapped (Signed)
Preoperative Diagnosis:  Bilateral Parotid gland Sialadenitis  ??  Postoperative Diagnosis:  Same.  ??  Procedure(s) Performed:  1.   left Parotid gland Sialendoscopy (CPT [phone] ).  ??  ??  Teaching Surgeon:   Madelon Lips, M.D.  ??  Assistants:   Belva Bertin, MD   ??  Anesthesia:   General endotracheal anesthesia.  ??  Specimens:   None   ??  Estimated Blood Loss:   Less than 10 mL   ??  Complications:   None.  ??  Operative Findings:   1. Left parotid duct without stones or debris   ??  Drains:  None  ??  Disposition:   Stable to PACU  ??  Indications for procedure:   Patient is a 43 y.o. 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 Patient was recommended to have a sialendoscopy to evaluate the duct's patency. The risk, benefits and alternatives including but not limited to the risks of bleeding, infection, no improvement in symptoms, and injury to the duct, need for more surgery or removal of the gland, were dicussed with the patient who elected to proceed with surgery. Patient had right done before and had improvement so today will do the left side.   ??  Procedure:   The patient was properly identified in the preoperative holding area and all consents were reviewed with the patient. The patient was taken back to the Operating Room by the Anesthesia staff and underwent general anesthesia with oral intubation. A brief time out was performed to confirm the patient???s identity and procedures to be performed, which were verified to be correct. Antibiotics were given. The table was then turned 90 degrees and the patient was then prepped and draped in the appropriate fashion for the aforementioned procedure.   ??  A small bite block was placed on the right side and a sweetheart retractor was used to retract the tongue to the right side, exposing the left Stenson's duct. After the papilla was identified, the left parotid duct punctum was serially dilated with lacrimal probes.  Once the duct was adequately dilated, the 1.1 mm sialendoscope was then inserted into the duct and advanced proximally towards the hilum of the duct.  There was noted to be lots of debris in the duct. Saline was used to irrigate the duct. No stones were found obstructing the duct. There was minimal debris and able to pass scope easily. Kenalog 10 was then injected into the duct. At this point, there was noted to be hemostasis, and the scope was removed. All insturments were removed from the oral cavity.   ??  At this time, care of this patient was transferred back over to the anesthesia team for extubation. The patient was then extubated and transferred to the PACU in stable condition. All instrument, sharp and lap counts were correct at the end of the case.   ??  Teaching Attestation: I, Madelon Lips, MD was present and scrubbed throughout the entirety of the case.

## 2019-02-21 DIAGNOSIS — M329 Systemic lupus erythematosus, unspecified: Secondary | ICD-10-CM

## 2019-02-21 MED ORDER — BELIMUMAB 200 MG/ML SUBCUTANEOUS AUTO-INJECTOR
SUBCUTANEOUS | 2 refills | 28.00000 days | Status: CP
Start: 2019-02-21 — End: ?
  Filled 2019-03-04: qty 4, 28d supply, fill #0

## 2019-02-21 MED ORDER — PREDNISONE 10 MG TABLET
ORAL_TABLET | 0 refills | 0 days | Status: CP
Start: 2019-02-21 — End: ?

## 2019-02-21 MED ORDER — OXYCODONE 5 MG TABLET
ORAL_TABLET | ORAL | 0 refills | 2.00000 days | Status: CP | PRN
Start: 2019-02-21 — End: 2019-02-26

## 2019-02-21 NOTE — Unmapped (Signed)
[facility] Refill Coordination Note    Specialty Medication(s) to be Shipped:   Inflammatory Disorders: Benlysta    Other medication(s) to be shipped: n/a     Tonya Brady, DOB: 1975/11/17  Phone: [phone] (home)       All above HIPAA information was verified with patient.     Completed refill call assessment today to schedule patient's medication shipment from the [facility] [phone]).       Specialty medication(s) and dose(s) confirmed: Regimen is correct and unchanged.   Changes to medications: Tonya Brady reports no changes at this time.  Changes to insurance: No  Questions for the pharmacist: No    Confirmed patient received Welcome Packet with first shipment. The patient will receive a drug information handout for each medication shipped and additional FDA Medication Guides as required.       DISEASE/MEDICATION-SPECIFIC INFORMATION        For patients on injectable medications: Patient currently has 2 doses left.  Next injection is scheduled for 02/27/19.    SPECIALTY MEDICATION ADHERENCE     Medication Adherence    Patient reported X missed doses in the last month: 0  Specialty Medication: Benlysta  Patient is on additional specialty medications: No  Patient is on more than two specialty medications: No  Any gaps in refill history greater than 2 weeks in the last 3 months: no  Demonstrates understanding of importance of adherence: yes  Informant: patient                Benlysta 200mg /ml: Patient has 14 days of medication on hand      SHIPPING     Shipping address confirmed in Epic.     Delivery Scheduled: Yes, Expected medication delivery date: 03/04/19.  However, Rx request for refills was sent to the provider as there are none remaining.     Medication will be delivered via Same Day Courier to the home address in Epic WAM.    [name]   [facility] Specialty Technician

## 2019-02-21 NOTE — Unmapped (Signed)
Benlysta refill  Last ov: 10/24/2018   Next ov: 08/11/2019     Labs:   AST   Date Value Ref Range Status   11/28/2018 37 14 - 38 U/L Final   07/26/2010 19 14 - 38 U/L Final     ALT   Date Value Ref Range Status   11/28/2018 41 (H) <35 U/L Final   07/26/2010 33 15 - 48 U/L Final     Creatinine/CP   Date Value Ref Range Status   05/29/2011 0.75 0.60 - 1.00 MG/DL Final     Creatinine   Date Value Ref Range Status   11/28/2018 0.69 0.60 - 1.00 mg/dL Final   29/56/2130 8.65 0.60 - 1.00 mg/dL Final     WBC   Date Value Ref Range Status   02/13/2019 3.9 (L) 4.5 - 11.0 10*9/L Final   06/30/2013 2.4 (L) 4.5 - 11.0 10*9/L Final     HGB   Date Value Ref Range Status   02/13/2019 13.8 13.5 - 16.0 g/dL Final   78/46/9629 52.8 12.0 - 16.0 g/dL Final     HCT   Date Value Ref Range Status   02/13/2019 41.2 36.0 - 46.0 % Final   06/30/2013 39.2 36.0 - 46.0 % Final     MCV   Date Value Ref Range Status   02/13/2019 91.0 80.0 - 100.0 fL Final   06/30/2013 89 80 - 100 fL Final     RDW   Date Value Ref Range Status   02/13/2019 13.6 12.0 - 15.0 % Final   06/30/2013 13.3 12.0 - 15.0 % Final     Platelet   Date Value Ref Range Status   02/13/2019 144 (L) 150 - 440 10*9/L Final   06/30/2013 149 (L) 150 - 440 10*9/L Final     Neutrophils %   Date Value Ref Range Status   02/13/2019 68.4 % Final     Lymphocytes %   Date Value Ref Range Status   02/13/2019 21.1 % Final     Monocytes %   Date Value Ref Range Status   02/13/2019 6.2 % Final     Eosinophils %   Date Value Ref Range Status   02/13/2019 2.0 % Final     Basophils %   Date Value Ref Range Status   02/13/2019 0.3 % Final

## 2019-03-04 MED FILL — BENLYSTA 200 MG/ML SUBCUTANEOUS AUTO-INJECTOR: 28 days supply | Qty: 4 | Fill #0 | Status: AC

## 2019-03-06 NOTE — Unmapped (Signed)
[facility]  [facility]  [address]  [place], [place]  [address]  Ph: [phone]  Fax: [phone]     HEPATOLOGY RETURN VISIT PROGRESS NOTE    REFERRING PROVIDER: Alexandria Lodge, MD  PRIMARY CARE PROVIDER: Alexandria Lodge, MD    DATE OF SERVICE: 03/07/2019    ASSESSMENT AND PLAN:   Tonya Brady is a 43 y.o. 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 She established care with [facility] in 01/2016 when she was referred for elevated liver enzymes and findings of possible cirrhosis on ultrasound. She underwent liver biopsy on 03/2016 showing cirrhosis secondary to steatohepatitis without evidence of AIH. Her cirrhosis is well compensated with no history of ascites, gastroesophageal varices or hepatic encephalopathy. She is not being considered for transplant due to low MELD. I stressed to her that it will be important to monitor her regularly going forward to ensure she doesn't decompensate, especially given her young age. She's in agreement with this and we will try to see each other every 6 months.    1. NASH Cirrhosis:   -Repeat MELD labs today  -Spoke extensively about weight loss to improve her liver profile and prevent future decompensating disease    2. EV Screening: Last done 04/2016 with no varices  -Repeat is due now, will order EGD     3. HCC Screening: Last done 11/2018 with no lesions. Repeat U/S q6 months + AFP  -Next U/S due 05/2019, will set up today  -AFP sent today    4. Health Maintenance:  -Osteoporosis: Increased risk of osteopenia in patients with cirrhosis. Recommend checking Vitamin D level as well as DEXA scan if abnormal.   -Cervical Cancer: Last pap smear done: s/p hysterectomy  -Mammogram: Last done: Never done, defer to PCP  -Colon Cancer: Due at age 79 5. Vaccinations: We recommend that patients have vaccinations to prevent various infections that can occur, especially in the setting of having underlying liver disease. The following vaccinations should be given:  -Hepatitis A: Vaccinated  -Hepatitis B: Vaccinated  -Influenza (yearly): Per PCP  -Pneumococcal: PPSV23 06/17/2008, giving repeat today. PCV13 due at age 71  -Zoster: Shingrix (age > 72). Two doses given 2-6 months apart.     I spent a majority of my time today with the patient reviewing historical data, old records (including faxed data and data in Care Everywhere), performing a physical examination, and formulating an assessment and plan together with the patient.    Return to clinic in July 2021.       Georga Hacking Sherryll Burger, MD  Assistant Professor of Medicine   [employer] [place] [name]  Division of Gastroenterology & Hepatology  p: [phone] - f: [phone]  E-mail: [email or web address]    Subjective   SUBJECTIVE:     CHIEF COMPLAINT: NASH Cirrhosis    HISTORY OF PRESENT ILLNESS:      Tonya Brady is a 43 y.o. 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 She established care with [facility] in 01/2016 when she was referred for elevated liver enzymes and findings of possible cirrhosis on ultrasound. She underwent liver biopsy on 03/2016 showing cirrhosis secondary to steatohepatitis without evidence of AIH. Her cirrhosis is well compensated with no history of ascites, gastroesophageal varices or hepatic encephalopathy.  INTERVAL HISTORY:(since the time of the patient's last visit): The patient was last seen by Dr. Julieta Gutting in 04/2016. She has been somewhat lost to follow up in the interim. She has continued to get her HCC screening, last done in 11/2018 with no lesions. She presents today alone. She has no complaints. She denies ascites, jaundice, pruritis and encephalopathy. She only reports mild scleral icterus from time to time; but her labs are always normal. She has put on weight during quarantine, but is starting to lose it again, going from 212 lbs to 200 lbs in the past few weeks.     REVIEW OF SYSTEMS:   The balance of 12 systems reviewed is negative except as noted in the HPI.     PAST MEDICAL HISTORY:  Past Medical History:   Diagnosis Date   ??? Bulging lumbar disc 2005   ??? Cirrhosis of liver (CMS-HCC)    ??? Difficult intravenous access    ??? Fibromyalgia 03/09/2013   ??? NASH (nonalcoholic steatohepatitis)    ??? Secondary Sjogren's syndrome (CMS-HCC) 03/09/2013    Secondary to SLE  Diagnosed: 12/2003 (by sx's, parotid gland enlargement, and serologies) Pathology: biopsy not done Labs: + ANA, anti-SSA, anti-SSB, RF    ??? Systemic lupus erythematosus (CMS-HCC) 01/19/2011   ??? Valvular regurgitation        MEDICATIONS:   Current Outpatient Medications   Medication Sig Dispense Refill   ??? belimumab 200 mg/mL AtIn Inject the contents of 1 syringe (200 mg) under the skin every seven (7) days. 4 mL 2   ??? buPROPion (WELLBUTRIN XL) 150 MG 24 hr tablet Take 1 tablet (150 mg total) by mouth every morning. 30 tablet 2   ??? empty container Misc Use as directed 1 each PRN   ??? famotidine (PEPCID) 40 MG tablet Take 1 tablet (40 mg total) by mouth nightly as needed for heartburn. 90 tablet 3   ??? hydrOXYchloroQUINE (PLAQUENIL) 200 mg tablet Take 1 tablet (200 mg total) by mouth Two (2) times a day. 60 tablet 1   ??? methylPREDNISolone (MEDROL DOSEPACK) 4 mg tablet Follow package directions inside box on the back of tablet insert 21 tablet 0 ??? predniSONE (DELTASONE) 10 MG tablet Take 40mg  for 3days, take 30mg  for 3 days, take 20mg  for 3 days, take 10mg  for 3 days (Patient not taking: Reported on 03/07/2019) 29 tablet 0     No current facility-administered medications for this visit.        ALLERGIES:   Aspirin, Fish containing products, Iodine, and Shellfish containing products       Objective   OBJECTIVE:   VITAL SIGNS: BP 119/57  - Pulse 59  - Temp 36.3 ??C (97.3 ??F) (Tympanic)  - Ht 154.9 cm (5' 1)  - Wt 90.7 kg (200 lb)  - SpO2 97%  - BMI 37.79 kg/m??   Wt Readings from Last 3 Encounters:   03/07/19 90.7 kg (200 lb)   02/18/19 92 kg (202 lb 13.2 oz)   02/04/19 91.7 kg (202 lb 1.9 oz)       PHYSICAL EXAM:  Constitutional: Alert, Oriented x 3, No acute distress, well nourished and well hydrated.   HEENT: PERRL, conjunctiva clear, anicteric, oropharynx clear, neck supple, no LAD.   CV: Regular rate and rhythm, normal S1, S2. No murmurs. No JVD.  Lung: Clear to auscultation bilaterally. Unlabored breathing.   Abdomen: soft, normal bowel sounds, non-distended, non-tender. No organomegaly. No ascites.   Extremities: No edema, well perfused  MSK:  No joint swelling or tenderness noted, no deformities  Skin: No rashes, jaundice or skin lesions noted  Neuro: No focal deficits. No asterixis.   Mental Status: Thought organized, appropriate affect, pleasantly interactive, not anxious appearing    DIAGNOSTIC STUDIES:  I have reviewed all pertinent diagnostic studies, including:  Laboratory results:  Lab Results   Component Value Date    WBC 3.9 (L) 02/13/2019    HGB 13.8 02/13/2019    MCV 91.0 02/13/2019    PLT 144 (L) 02/13/2019    Lab Results   Component Value Date    NA 142 11/28/2018    K 4.6 11/28/2018    CL 106 11/28/2018    CREATININE 0.69 11/28/2018    GLU 105 11/28/2018    CALCIUM 9.5 11/28/2018      Lab Results   Component Value Date    ALBUMIN 4.0 02/13/2019    AST 37 11/28/2018    ALT 41 (H) 11/28/2018    ALKPHOS 51 11/28/2018 BILITOT 0.5 11/28/2018    Lab Results   Component Value Date    INR 1.26 11/28/2018        No results found for: AFPTM    Lab Results   Component Value Date    IRON 67 01/31/2016    FERRITIN 251.0 (H) 01/31/2016     Lab Results   Component Value Date    CHOL 148 02/13/2019    TRIG 93 02/13/2019    A1C 5.4 02/13/2019     Lab Results   Component Value Date    VITAMINB12 >1000 (H) 06/23/2011     Lab Results   Component Value Date    HBSAG Nonreactive 11/17/2015    HEPCAB Nonreactive 11/17/2015     Lab Results   Component Value Date    ANA Positive (A) 01/31/2016    SMOOTHMUSCAB Negative 01/31/2016    MITOAB Negative 01/31/2016       MELD-Na score: 9 at 11/28/2018  9:58 AM  Calculated from:  Serum Creatinine: 0.69 mg/dL (Rounded to 1 mg/dL) at 06/29/4130  4:40 AM  Serum Sodium: 142 mmol/L (Rounded to 137 mmol/L) at 11/28/2018  9:58 AM  Total Bilirubin: 0.5 mg/dL (Rounded to 1 mg/dL) at 1/0/2725  3:66 AM  INR(ratio): 1.26 at 11/28/2018  9:58 AM  Age: 80 years 5 months    Radiographic studies:  US Abdomen complete 11/25/2018  1.Heterogeneous nodular liver which can be seen in chronic liver disease. No focal hepatic masses. Note that MRI is more sensitive for detection of hepatic masses.  2.Mild splenomegaly, unchanged.    GI Procedures:  EGD 05/10/2016:       The examined esophagus was normal.       There is no endoscopic evidence of varices in the entire esophagus.       The entire examined stomach was normal.       There is no endoscopic evidence of varices in the stomach.       The duodenal bulb and second portion of the duodenum were normal.    Liver Biopsy 03/24/2016:  A: Liver, core biopsy  - Liver with bridging fibrosis/early cirrhosis (stage 3???4), mild steatosis, and changes consistent with steatohepatitis

## 2019-03-07 ENCOUNTER — Ambulatory Visit: Admit: 2019-03-07 | Discharge: 2019-03-08 | Payer: PRIVATE HEALTH INSURANCE

## 2019-03-07 DIAGNOSIS — K7460 Unspecified cirrhosis of liver: Secondary | ICD-10-CM

## 2019-03-07 DIAGNOSIS — K7581 Nonalcoholic steatohepatitis (NASH): Principal | ICD-10-CM

## 2019-03-07 LAB — AFP-TUMOR MARKER: Alpha-1-Fetoprotein.tumor marker:MCnc:Pt:Ser/Plas:Qn:: 2

## 2019-03-07 LAB — BASIC METABOLIC PANEL
ANION GAP: 12 mmol/L (ref 7–15)
BLOOD UREA NITROGEN: 12 mg/dL (ref 7–21)
BUN / CREAT RATIO: 16
CALCIUM: 9.3 mg/dL (ref 8.5–10.2)
CHLORIDE: 107 mmol/L (ref 98–107)
CREATININE: 0.76 mg/dL (ref 0.60–1.00)
EGFR CKD-EPI AA FEMALE: >90 mL/min/{1.73_m2} (ref >=60)
EGFR CKD-EPI NON-AA FEMALE: >90 mL/min/{1.73_m2} (ref >=60)
GLUCOSE RANDOM: 101 mg/dL (ref 70–179)
POTASSIUM: 4.4 mmol/L (ref 3.5–5.0)

## 2019-03-07 LAB — CBC
HEMOGLOBIN: 13.6 g/dL (ref 12.0–16.0)
MEAN CORPUSCULAR HEMOGLOBIN CONC: 32.6 g/dL (ref 31.0–37.0)
MEAN CORPUSCULAR HEMOGLOBIN: 30.6 pg (ref 26.0–34.0)
MEAN CORPUSCULAR VOLUME: 93.9 fL (ref 80.0–100.0)
MEAN PLATELET VOLUME: 10.1 fL — ABNORMAL HIGH (ref 7.0–10.0)
PLATELET COUNT: 149 10*9/L — ABNORMAL LOW (ref 150–440)
RED BLOOD CELL COUNT: 4.45 10*12/L (ref 4.00–5.20)
RED CELL DISTRIBUTION WIDTH: 13 % (ref 12.0–15.0)
WBC ADJUSTED: 3.6 10*9/L — ABNORMAL LOW (ref 4.5–11.0)

## 2019-03-07 LAB — HEPATIC FUNCTION PANEL
ALBUMIN: 4.4 g/dL (ref 3.5–5.0)
ALT (SGPT): 25 U/L (ref <35)
AST (SGOT): 23 U/L (ref 14–38)
BILIRUBIN TOTAL: 0.7 mg/dL (ref 0.0–1.2)

## 2019-03-07 LAB — PROTIME-INR: INR: 1.16

## 2019-03-07 LAB — SODIUM: Sodium:SCnc:Pt:Ser/Plas:Qn:: 142

## 2019-03-07 LAB — PROTIME: Coagulation tissue factor induced:Time:Pt:PPP:Qn:Coag: 13.4 — ABNORMAL HIGH

## 2019-03-07 LAB — GAMMA GLUTAMYL TRANSFERASE: Gamma glutamyl transferase:CCnc:Pt:Ser/Plas:Qn:: 22

## 2019-03-07 LAB — PROTEIN TOTAL: Protein:MCnc:Pt:Ser/Plas:Qn:: 7.6

## 2019-03-07 LAB — WBC ADJUSTED: Leukocytes:NCnc:Pt:Bld:Qn:: 3.6 — ABNORMAL LOW

## 2019-03-07 NOTE — Unmapped (Signed)
Per provider, the patient received Pneumococcal 23-valant vaccine.  Patient ID verified with name and date of birth.  All screening questions were answered.  Vaccine(s) were administered as ordered.  See immunization history for documentation.  Patient tolerated the injection(s) well with no issues noted.  Vaccine Information sheet given to the patient.

## 2019-03-25 NOTE — Unmapped (Signed)
[facility] Refill Coordination Note    Specialty Medication(s) to be Shipped:   Inflammatory Disorders: Benlysta 200MG /ML     Anastasio Auerbach, DOB: 09/07/1975  Phone: [phone] (home)     All above HIPAA information was verified with patient.     Completed refill call assessment today to schedule patient's medication shipment from the [facility] [phone]).       Specialty medication(s) and dose(s) confirmed: Regimen is correct and unchanged.   Changes to medications: Niyah reports no changes at this time.  Changes to insurance: No  Questions for the pharmacist: No    Confirmed patient received Welcome Packet with first shipment. The patient will receive a drug information handout for each medication shipped and additional FDA Medication Guides as required.       DISEASE/MEDICATION-SPECIFIC INFORMATION        For patients on injectable medications: Patient currently has 1 doses left.  Next injection is scheduled for 03/27/2019.    SPECIALTY MEDICATION ADHERENCE     Medication Adherence    Patient reported X missed doses in the last month: 0  Specialty Medication: benlysta 200mg /ml  Patient is on additional specialty medications: No  Informant: patient  Reliability of informant: reliable       SHIPPING     Shipping address confirmed in Epic.     Delivery Scheduled: Yes, Expected medication delivery date: 04/01/2019.     Medication will be delivered via Same Day Courier to the home address in Epic [place].    Shenequa Howse P Allena Katz   [facility] Specialty Technician

## 2019-04-01 MED FILL — BENLYSTA 200 MG/ML SUBCUTANEOUS AUTO-INJECTOR: SUBCUTANEOUS | 28 days supply | Qty: 4 | Fill #1

## 2019-04-01 MED FILL — BENLYSTA 200 MG/ML SUBCUTANEOUS AUTO-INJECTOR: 28 days supply | Qty: 4 | Fill #1 | Status: AC

## 2019-04-23 NOTE — Unmapped (Signed)
[facility] Refill Coordination Note    Specialty Medication(s) to be Shipped:   Inflammatory Disorders: Benlysta    Other medication(s) to be shipped: n/a     Tonya Brady, DOB: 10/26/1975  Phone: [phone] (home)       All above HIPAA information was verified with patient.     Completed refill call assessment today to schedule patient's medication shipment from the [facility] [phone]).       Specialty medication(s) and dose(s) confirmed: Regimen is correct and unchanged.   Changes to medications: Madina reports no changes at this time.  Changes to insurance: No  Questions for the pharmacist: No    Confirmed patient received Welcome Packet with first shipment. The patient will receive a drug information handout for each medication shipped and additional FDA Medication Guides as required.       DISEASE/MEDICATION-SPECIFIC INFORMATION        For patients on injectable medications: Patient currently has 1 doses left.  Next injection is scheduled for 04/24/19.    SPECIALTY MEDICATION ADHERENCE     Medication Adherence    Patient reported X missed doses in the last month: 0  Specialty Medication: Benlysta  Patient is on additional specialty medications: No  Patient is on more than two specialty medications: No  Any gaps in refill history greater than 2 weeks in the last 3 months: no  Demonstrates understanding of importance of adherence: yes  Informant: patient                Benlysta 200mg /ml: Patient has 7 days of medication on hand      SHIPPING     Shipping address confirmed in Epic.     Delivery Scheduled: Yes, Expected medication delivery date: 04/29/19.     Medication will be delivered via Same Day Courier to the prescription address in Epic [place].    [name]   [facility] Specialty Technician

## 2019-04-25 NOTE — Unmapped (Signed)
Nutrition Consult Note    Referring Provider:  Self, Referred    Reason for Referral:  Nutrition Counseling      Medical History:  Patient Active Problem List   Diagnosis   ??? Systemic lupus erythematosus (CMS-HCC)   ??? Allergic rhinitis   ??? Secondary Sjogren's syndrome (CMS-HCC)   ??? Fibromyalgia   ??? Restless leg syndrome   ??? Thrombocytopenia (CMS-HCC)   ??? Palpitations   ??? Morbid obesity due to excess calories (CMS-HCC)   ??? Chronic abdominal pain   ??? 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 Moderate episode of recurrent major depressive disorder (CMS-HCC)   ??? Gastroesophageal reflux disease   ??? GAD (generalized anxiety disorder)   ??? Acute left ankle pain   ??? Acute carpal tunnel syndrome of left wrist   ??? Annual physical exam   ??? Acquired trigger finger   ??? Swelling of right parotid gland   ??? Swelling of left parotid gland       Barriers to Care:  acuity of illness    - Weight from last MD appointment   Present height 154.9 cm (5' 1)  Present weight 90.7 kg (200 lb)  Current BMI (!) 37.81    Weight Status Categories:  Underweight:  BMI < 18.5  Normal Weight:  BMI 18.5 - 24.9  Overweight:  BMI 25 - 29.9  Obesity Class I:  BMI 30 - 34.9  Obesity Class II:  BMI 35 - 39.9  Obesity Class III:  BMI ? 40    Weight History:  Wt Readings from Last 6 Encounters:   04/28/19 90.7 kg (200 lb)   03/07/19 90.7 kg (200 lb)   02/18/19 92 kg (202 lb 13.2 oz)   02/04/19 91.7 kg (202 lb 1.9 oz)   12/30/18 90.3 kg (199 lb 1.2 oz)   12/06/18 90.5 kg (199 lb 8.3 oz)       Allergies:   Allergies   Allergen Reactions   ??? Aspirin Swelling     Other reaction(s): SWELLING/EDEMA   ??? Fish Containing Products Diarrhea and Nausea And Vomiting   ??? Iodine Other (See Comments)     Pt told not to have d/t shellfish allergy   ??? Shellfish Containing Products Hives, Diarrhea and Nausea Only     Other reaction(s): HIVES  Other reaction(s): HIVES  Other reaction(s): NAUSEA       Relevant Medications, Herbs, Supplements: Current Outpatient Medications:   ???  belimumab 200 mg/mL AtIn, Inject the contents of 1 syringe (200 mg) under the skin every seven (7) days., Disp: 4 mL, Rfl: 2  ???  buPROPion (WELLBUTRIN XL) 150 MG 24 hr tablet, Take 1 tablet (150 mg total) by mouth every morning., Disp: 30 tablet, Rfl: 2  ???  empty container Misc, Use as directed, Disp: 1 each, Rfl: PRN  ???  famotidine (PEPCID) 40 MG tablet, Take 1 tablet (40 mg total) by mouth nightly as needed for heartburn., Disp: 90 tablet, Rfl: 3  ???  hydrOXYchloroQUINE (PLAQUENIL) 200 mg tablet, Take 1 tablet (200 mg total) by mouth Two (2) times a day., Disp: 60 tablet, Rfl: 1  ???  methylPREDNISolone (MEDROL DOSEPACK) 4 mg tablet, Follow package directions inside box on the back of tablet insert, Disp: 21 tablet, Rfl: 0  ???  predniSONE (DELTASONE) 10 MG tablet, Take 40mg  for 3days, take 30mg  for 3 days, take 20mg  for 3 days, take 10mg  for 3 days, Disp: 29 tablet, Rfl: 0  Do you have any concerns about taking or affording your medications?  None    Relevant Labs:  Hemoglobin A1C (%)   Date Value   02/13/2019 5.4   11/28/2017 5.2   10/10/2016 5.6   02/10/2016 5.9   06/23/2011 5.5      No components found for: LDLCALC   BP Readings from Last 3 Encounters:   03/07/19 119/57   02/18/19 117/74   02/04/19 110/71     Lab Results   Component Value Date    CHOL 148 02/13/2019    CHOL 160 11/28/2017    CHOL 165 06/23/2011     Lab Results   Component Value Date    HDL 37 (L) 02/13/2019    HDL 44 11/28/2017    HDL 35 (L) 06/23/2011     Lab Results   Component Value Date    LDL 92 02/13/2019    LDL 103 (H) 11/28/2017    LDL 104 06/23/2011     Lab Results   Component Value Date    VLDL 18.6 02/13/2019    VLDL 13.2 11/28/2017     Lab Results   Component Value Date    CHOLHDLRATIO 4.0 02/13/2019    CHOLHDLRATIO 3.6 11/28/2017     Lab Results   Component Value Date    TRIG 93 02/13/2019    TRIG 66 11/28/2017    TRIG 132 06/23/2011       No results found for: VITDTOTAL  Lab Results Component Value Date    VITAMINB12 >1000 (H) 06/23/2011       Physical Activity:  Loves to walk   -- doesn't always do as well during the cold   -- none December none at this time     24-Hour recall/usual intake:    Time Intake   Breakfast Cereal   Yogurt   Banana and Grapes    Snack (AM)    Lunch 5 chicken nuggets and 1/2 fries   -- salad and sandwich   -- bowl of soup   -- Raman    Snack (PM)    Dinner Brett Albino   -- Spaghetti baked pork chops  Hamburger helper   Vegetables 1-2   Green [employer]ans or [employer] or creamed potatos    Snack (HS)      Other Nutrition Information:  HISTORY Cirosis of the liver - 3 years ago   -- lots of scar tissues  - worked on weight loss   Got down to 195 lbs but reports started to regain the weight   -- daughter and kids moved in which has changed food     NUTRITION KNOWLEDGE has seen an RD in the past    USUAL INTAKES two-three meals   Sometimes skips breakfast     SNACKING Does have some snacks around - pack of cookies or bag of chips   Bananas Grapes and Yogurt  Low Fat Tricots    GROCERY SHOPPING 1-2 times a week   -8-9 people in the house    COOKING Stove has been out this week     DINING OUT 1x per week   Sometimes twice     BEVERAGES Water and diet coke   Coffee     INTOLERANCES Spaghetti sauce     SLEEP 6 is doing well     STRESS Very elevated   Struggles: Stress Eating - Peanut M and Ms    - eating the whole bag  Patient arrives for assistance with weight loss.  Reviewed dietary recall and physical activity.  Set goals for next appointment. Patient notes that she has been struggling with COVID and family moving in.  She notes goal of weight loss for health.      Estimated Daily Needs:  1510 calories (Mifflin-St Jeor equation for BMI > 25 using actual body weight, adjusted for weight loss)  72 g protein    Nutrition Diagnosis: Overweight/obesity related to larger portions, limited physical activity and consumption of unbalanced meals as evidenced by BMI >30, 24 hour recall and no physical activity.    Nutrition Intervention:  Nutrition Counseling    Materials Provided:  N/A    Patient Stated Nutrition Goals:  1) Practice distraction when stress are elevated, daily     Read   To be reviewed at next appointment     2) Choose fruits and vegetables - as snacks- daily    Purchase sweets in single serving containers    To be reviewed at next appointment     Goals Added to Visit Navigator?  Yes    Monitoring/Evaluation:  Progress towards goals:  baseline    Expected Compliance is:   Readiness for change action stage    Follow-up:  2 weeks     Length of visit was 23 minutes  2 unit(s) billed per insurance    Rita Ohara MS RD CDE         PCP: Alexandria Lodge, MD  Address on File: [address], [place] [address], [facility] [place]      Verified as Current Location: Yes  Extended Emergency Contact Information  Primary Emergency Contact: Minda D[place]  Home Phone: [phone]  Relation: Spouse  Secondary Emergency Contact: Hatch,Bridgette   [place]  Home Phone: [phone]  Mobile Phone: [phone]  Relation: Daughter  Mother: Reggie [place] of [place]  Home Phone: [phone]     Verified Behavioral Health Emergency Contact: N/A    I spent 23 minutes on the phone with the patient. I spent an additional 10 minutes on pre- and post-visit activities.     The patient was physically located in [place] or a state in which I am permitted to provide care. The patient and/or parent/guardian understood that s/he may incur co-pays and cost sharing, and agreed to the telemedicine visit. The visit was reasonable and appropriate under the circumstances given the patient's presentation at the time. The patient and/or parent/guardian has been advised of the potential risks and limitations of this mode of treatment (including, but not limited to, the absence of in-person examination) and has agreed to be treated using telemedicine. The patient's/patient's family's questions regarding telemedicine have been answered.     If the visit was completed in an ambulatory setting, the patient and/or parent/guardian has also been advised to contact their provider???s office for worsening conditions, and seek emergency medical treatment and/or call 911 if the patient deems either necessary.

## 2019-04-28 NOTE — Unmapped (Signed)
1) Practice distraction when stress are elevated, daily     Read   To be reviewed at next appointment     2) Choose fruits and vegetables - as snacks- daily    Purchase sweets in single serving containers    To be reviewed at next appointment

## 2019-04-28 NOTE — Unmapped (Signed)
Addended by: Coralee Pesa B on: 04/28/2019 02:20 PM     Modules accepted: Level of Service

## 2019-04-29 ENCOUNTER — Ambulatory Visit: Admit: 2019-04-29 | Discharge: 2019-04-30 | Payer: PRIVATE HEALTH INSURANCE | Attending: Family | Primary: Family

## 2019-04-29 MED FILL — BENLYSTA 200 MG/ML SUBCUTANEOUS AUTO-INJECTOR: 28 days supply | Qty: 4 | Fill #2 | Status: AC

## 2019-04-29 MED FILL — BENLYSTA 200 MG/ML SUBCUTANEOUS AUTO-INJECTOR: SUBCUTANEOUS | 28 days supply | Qty: 4 | Fill #2

## 2019-04-29 NOTE — Unmapped (Signed)
COVID Pre-Procedure Intake Form     Assessment     Tonya Brady is a 43 y.o. female presenting to [facility] for COVID testing.     Plan     If no testing performed, pt counseled on routine care for respiratory illness.  If testing performed, COVID sent.  Patient directed to Home given findings during today's visit.    Subjective     Tonya Brady is a 43 y.o. female who presents to the [facility] with complaints of the following:    Exposure History: In the last 21 days?     Have you traveled outside of [place]? No               Have you been in close contact with someone confirmed by a test to have COVID? (Close contact is within 6 feet for at least 10 minutes) No       Have you worked in a health care facility? No     Lived or worked facility like a nursing home, group home, or assisted living?    No         Are you scheduled to have surgery or a procedure in the next 3 days? Yes               Are you scheduled to receive cancer chemotherapy within the next 7 days?    No     Have you ever been tested before for COVID-19 with a swab of your nose? Yes: When: September , Where: NA    Are you a healthcare worker being tested so to return to work No     Right now,  do you have any of the following that developed over the past 7 days (as stated by patient on intake form):    Subjective fever (felt feverish) No   Chills (especially repeated shaking chills) No   Severe fatigue (felt very tired) No   Muscle aches No   Runny nose No   Sore throat No   Loss of taste or smell No   Cough (new onset or worsening of chronic cough) No   Shortness of breath No   Nausea or vomiting No   Headache No   Abdominal Pain No   Diarrhea (3 or more loose stools in last 24 hours) No Scribe's Attestation: Paulita Fujita, FNP obtained and performed the history, physical exam and medical decision making elements that were entered into the chart.  Signed by Erma Heritage serving as Scribe, on 04/29/2019 10:20 AM      The documentation recorded by the scribe accurately reflects the service I personally performed and the decisions made by me. Aida Puffer, FNP  April 29, 2019 10:53 AM

## 2019-05-02 ENCOUNTER — Encounter
Admit: 2019-05-02 | Discharge: 2019-05-06 | Payer: PRIVATE HEALTH INSURANCE | Attending: Anesthesiology | Primary: Anesthesiology

## 2019-05-02 ENCOUNTER — Ambulatory Visit: Admit: 2019-05-02 | Discharge: 2019-05-06 | Payer: PRIVATE HEALTH INSURANCE

## 2019-05-02 MED ADMIN — lactated Ringers infusion: 10 mL/h | INTRAVENOUS | @ 19:00:00 | Stop: 2019-05-06

## 2019-05-02 MED ADMIN — propofoL (DIPRIVAN) injection: INTRAVENOUS | @ 19:00:00 | Stop: 2019-05-02

## 2019-05-02 MED ADMIN — lidocaine (XYLOCAINE) 20 mg/mL (2 %) injection: INTRAVENOUS | @ 19:00:00 | Stop: 2019-05-02

## 2019-05-02 MED ADMIN — propofol (DIPRIVAN) infusion 10 mg/mL: INTRAVENOUS | @ 19:00:00 | Stop: 2019-05-02

## 2019-05-08 NOTE — Unmapped (Signed)
Nutrition Consult Note    Referring Provider:  Self, Referred    Reason for Referral:  Nutrition Counseling        Medical History:  Patient Active Problem List   Diagnosis   ??? Systemic lupus erythematosus (CMS-HCC)   ??? Allergic rhinitis   ??? Secondary Sjogren's syndrome (CMS-HCC)   ??? Fibromyalgia   ??? Restless leg syndrome   ??? Thrombocytopenia (CMS-HCC)   ??? Palpitations   ??? Morbid obesity due to excess calories (CMS-HCC)   ??? Chronic abdominal pain   ??? 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 Moderate episode of recurrent major depressive disorder (CMS-HCC)   ??? Gastroesophageal reflux disease   ??? GAD (generalized anxiety disorder)   ??? Acute left ankle pain   ??? Acute carpal tunnel syndrome of left wrist   ??? Annual physical exam   ??? Acquired trigger finger   ??? Swelling of right parotid gland   ??? Swelling of left parotid gland       Barriers to Care:  acuity of illness    - Weight from last MD appointment   Present height 154.9 cm (5' 1)  Present weight 90.7 kg (200 lb)  Current BMI (!) 37.81      Weight Status Categories:  Underweight:  BMI < 18.5  Normal Weight:  BMI 18.5 - 24.9  Overweight:  BMI 25 - 29.9  Obesity Class I:  BMI 30 - 34.9  Obesity Class II:  BMI 35 - 39.9  Obesity Class III:  BMI ? 40    Weight History:  Wt Readings from Last 6 Encounters:   05/12/19 90.7 kg (200 lb)   04/28/19 90.7 kg (200 lb)   03/07/19 90.7 kg (200 lb)   02/18/19 92 kg (202 lb 13.2 oz)   02/04/19 91.7 kg (202 lb 1.9 oz)   12/30/18 90.3 kg (199 lb 1.2 oz)       Allergies:   Allergies   Allergen Reactions   ??? Aspirin Swelling     Other reaction(s): SWELLING/EDEMA   ??? Fish Containing Products Diarrhea and Nausea And Vomiting   ??? Iodine Other (See Comments)     Pt told not to have d/t shellfish allergy   ??? Shellfish Containing Products Hives, Diarrhea and Nausea Only     Other reaction(s): HIVES  Other reaction(s): HIVES  Other reaction(s): NAUSEA       Relevant Medications, Herbs, Supplements: Current Outpatient Medications:   ???  belimumab 200 mg/mL AtIn, Inject the contents of 1 syringe (200 mg) under the skin every seven (7) days., Disp: 4 mL, Rfl: 2  ???  buPROPion (WELLBUTRIN XL) 150 MG 24 hr tablet, Take 1 tablet (150 mg total) by mouth every morning., Disp: 30 tablet, Rfl: 2  ???  famotidine (PEPCID) 40 MG tablet, Take 1 tablet (40 mg total) by mouth nightly as needed for heartburn., Disp: 90 tablet, Rfl: 3  ???  hydrOXYchloroQUINE (PLAQUENIL) 200 mg tablet, Take 1 tablet (200 mg total) by mouth Two (2) times a day., Disp: 60 tablet, Rfl: 1  ???  predniSONE (DELTASONE) 10 MG tablet, Take 40mg  for 3days, take 30mg  for 3 days, take 20mg  for 3 days, take 10mg  for 3 days, Disp: 29 tablet, Rfl: 0    Do you have any concerns about taking or affording your medications?  None    Relevant Labs:  Hemoglobin A1C (%)   Date Value   02/13/2019 5.4   11/28/2017 5.2  10/10/2016 5.6   02/10/2016 5.9   06/23/2011 5.5      No components found for: LDLCALC   BP Readings from Last 3 Encounters:   05/02/19 102/60   03/07/19 119/57   02/18/19 117/74     Lab Results   Component Value Date    CHOL 148 02/13/2019    CHOL 160 11/28/2017    CHOL 165 06/23/2011     Lab Results   Component Value Date    HDL 37 (L) 02/13/2019    HDL 44 11/28/2017    HDL 35 (L) 06/23/2011     Lab Results   Component Value Date    LDL 92 02/13/2019    LDL 103 (H) 11/28/2017    LDL 104 06/23/2011     Lab Results   Component Value Date    VLDL 18.6 02/13/2019    VLDL 13.2 11/28/2017     Lab Results   Component Value Date    CHOLHDLRATIO 4.0 02/13/2019    CHOLHDLRATIO 3.6 11/28/2017     Lab Results   Component Value Date    TRIG 93 02/13/2019    TRIG 66 11/28/2017    TRIG 132 06/23/2011       No results found for: VITDTOTAL  Lab Results   Component Value Date    VITAMINB12 >1000 (H) 06/23/2011       Physical Activity:  Loves to walk   -- struggling to walk recently with legs     24-Hour recall/usual intake:    Time Intake   Breakfast Cereal   Yogurt Banana and Grapes     Yogurt - Apple or banana      Snack (AM)    Lunch 5 chicken nuggets and 1/2 fries   -- salad and sandwich   -- bowl of soup   -- Raman     Pizza rolls     Grilled chicken salad    Snack (PM)    Dinner [employer]   -- Spaghetti baked pork chops  Hamburger helper   Vegetables 1-2   Green [employer]ans or [employer] or creamed potatos     [employer] - burger 1/2 fries     Grilled Chicken and Green Beans    Snack (HS)      Other Nutrition Information:  HISTORY Cirosis of the liver - 3 years ago   -- lots of scar tissues  - worked on weight loss   Got down to 195 lbs but reports started to regain the weight   -- daughter and kids moved in which has changed food     -- reports family had a COVID scare   Feels eating has been better     Notes that stress has been elevated     USUAL INTAKES two-three meals   Sometimes skips breakfast   -- reduced portions   -- steamer meals on days she works     SNACKING Does have some snacks around - pack of cookies or bag of chips   Bananas Grapes and Yogurt  Low Fat Tricots    -- precut apple slices and celery and carrots   -- sometimes hershey kisses at [employer] 1-2 times a week   -8-9 people in the house    COOKING Stove has been out this week   -- trying to cut down on bread pasta     DINING OUT 1x per week   Sometimes twice   -- a  few times a week   -- family eats a lot of hamburger helper    BEVERAGES Water and diet coke   Coffee   Increased water intake     INTOLERANCES Spaghetti sauce     SLEEP 6 is doing well     STRESS Very elevated   Struggles: Stress Eating - Peanut M and Ms    - eating the whole bag         Patient arrives for assistance with weight loss.  Reviewed dietary recall and physical activity.  Set goals for next appointment. Notes that she has struggled with stress.  Reports she is going a little better.       Estimated Daily Needs:  1510 calories (Mifflin-St Jeor equation for BMI > 25 using actual body weight, adjusted for weight loss)  72 g protein    Nutrition Diagnosis:  Overweight/obesity related to larger portions, limited physical activity and consumption of unbalanced meals as evidenced by BMI >30, 24 hour recall and no physical activity.    Nutrition Intervention:  Nutrition Counseling    Materials Provided:  N/A    Patient Stated Nutrition Goals:  1) Practice distraction when stress are elevated, daily  (100%)    Read   To be reviewed at next appointment     New Goal     2) Choose fruits and vegetables - as snacks- daily    Purchase sweets in single serving containers    To be reviewed at next appointment   Add a cheese stick or nuts to reduce hunger at lunch       Goals Added to Visit Navigator?  Yes    Monitoring/Evaluation:  Progress towards goals:  baseline    Expected Compliance is:   Readiness for change action stage    Follow-up:  2 weeks     Length of visit was 21 minutes  2 unit(s) billed per insurance    Rita Ohara MS RD CDE         PCP: Alexandria Lodge, MD  Address on File: [address], [place] [address], [facility] [place]      Verified as Current Location: Yes  Extended Emergency Contact Information  Primary Emergency Contact: Minda Ditto [place]  Home Phone: [phone]  Relation: Spouse  Secondary Emergency Contact: Hatch,Bridgette   [place]  Home Phone: [phone]  Mobile Phone: [phone]  Relation: Daughter  Mother: Reggie Pile of [place]  Home Phone: [phone]     Verified Behavioral Health Emergency Contact: N/A    I spent 21 minutes on the phone with the patient. I spent an additional 10 minutes on pre- and post-visit activities.     The patient was physically located in [place] or a state in which I am permitted to provide care. The patient and/or parent/guardian understood that s/he may incur co-pays and cost sharing, and agreed to the telemedicine visit. The visit was reasonable and appropriate under the circumstances given the patient's presentation at the time.    The patient and/or parent/guardian has been advised of the potential risks and limitations of this mode of treatment (including, but not limited to, the absence of in-person examination) and has agreed to be treated using telemedicine. The patient's/patient's family's questions regarding telemedicine have been answered.     If the visit was completed in an ambulatory setting, the patient and/or parent/guardian has also been advised to contact their provider???s office for worsening  conditions, and seek emergency medical treatment and/or call 911 if the patient deems either necessary.

## 2019-05-09 ENCOUNTER — Institutional Professional Consult (permissible substitution)
Admit: 2019-05-09 | Discharge: 2019-05-10 | Payer: PRIVATE HEALTH INSURANCE | Attending: Registered" | Primary: Registered"

## 2019-05-12 ENCOUNTER — Ambulatory Visit
Admit: 2019-05-12 | Discharge: 2019-05-13 | Payer: PRIVATE HEALTH INSURANCE | Attending: Family Medicine | Primary: Family Medicine

## 2019-05-12 DIAGNOSIS — N3000 Acute cystitis without hematuria: Principal | ICD-10-CM

## 2019-05-12 MED ORDER — SULFAMETHOXAZOLE 800 MG-TRIMETHOPRIM 160 MG TABLET
ORAL_TABLET | Freq: Two times a day (BID) | ORAL | 0 refills | 3.00000 days | Status: CP
Start: 2019-05-12 — End: 2019-05-15

## 2019-05-12 NOTE — Unmapped (Signed)
Assessment/Plan:     Chief Complaint   Patient presents with   ??? Urinary Tract Infection     burning sensation, some pain, urgency and pressure. No back pain       Problem List Items Addressed This Visit        Unprioritized    Acute cystitis without hematuria - Primary     UA neg however pt has classic sx of UTI--increased freq, dysuria. Denies fevers. Pt states that she has had UTI in the past with similar sx and neg UA. I offered to get urine cultures and hold off on abx. Pt requested to start abx now.          Relevant Medications    sulfamethoxazole-trimethoprim (BACTRIM DS) 800-160 mg per tablet    Other Relevant Orders    POCT urinalysis dipstick (Completed)    Urine Culture           Push fluids, may take AZO for no longer than 3 days. Call if not improving, worsening, fevers, chills, back pain, nausea, vomiting. Urine culture sent.    Return if symptoms worsen or fail to improve.    I have reviewed and addressed the patient???s adherence and response to prescribed medications. I have identified patient barriers to following the proposed medication and treatment plan, and have noted opportunities to optimize healthy behaviors. I have answered the patient???s questions to satisfaction and the patient voices understanding.    Time: Greater than 50% of this encounter was spent in direct consultation with the patient in evaluation and discussing all of the above. Duration of encounter: 15 minutes.    HPI:     Tonya Brady is a 43 y.o. year old female who presents today with a 3-4 day history of possible UTI symptoms.    Patient c/o increased freq, dysuria for 3 days. Tried cranberry juice. No change. Pt has had UTI in the past with very similar sx. Pt also had normal UA. Denies fevers, back pain. Denies hx of prior std. Married. Husband impotent so not sexually active for over 5 years per pt. Pt is drinking plenty of water. No blood in urine.     Denies  ?? Fevers  ?? Chills  ?? Nausea  ?? Vomiting  ?? Abdominal pain ?? Back pain  ?? Vaginal disch      Past Medical/Surgical History:     Past Medical History:   Diagnosis Date   ??? Bulging lumbar disc 2005   ??? Cirrhosis of liver (CMS-HCC)    ??? Difficult intravenous access    ??? Fibromyalgia 03/09/2013   ??? NASH (nonalcoholic steatohepatitis)    ??? Secondary Sjogren's syndrome (CMS-HCC) 03/09/2013    Secondary to SLE  Diagnosed: 12/2003 (by sx's, parotid gland enlargement, and serologies) Pathology: biopsy not done Labs: + ANA, anti-SSA, anti-SSB, RF    ??? Systemic lupus erythematosus (CMS-HCC) 01/19/2011   ??? Valvular regurgitation      Past Surgical History:   Procedure Laterality Date   ??? CARPAL TUNNEL RELEASE     ??? CESAREAN SECTION     ??? CHOLECYSTECTOMY     ??? HERNIA REPAIR     ??? PR REVISE MEDIAN N/CARPAL TUNNEL SURG Left 12/30/2018    Procedure: R16 NEUROPLASTY AND/OR TRANSPOSITION; MEDIAN NERVE AT CARPAL TUNNEL;  Surgeon: Theodora Blow Jacqlyn Krauss, MD;  Location: [facility];  Service: Orthopedics   ??? PR SALIVARY SURG UNLISTED PROC Right 12/06/2018    Procedure: R22 UNLISTED PROC SALIVARY GLANDS/DUCTS;  Surgeon: Michelle Piper, MD;  Location: [facility] OR [facility];  Service: ENT   ??? PR SALIVARY SURG UNLISTED PROC Left 02/18/2019    Procedure: R25 UNLISTED PROC SALIVARY GLANDS/DUCTS;  Surgeon: Michelle Piper, MD;  Location: [facility];  Service: ENT   ??? PR UPPER GI ENDOSCOPY,DIAGNOSIS N/A 05/10/2016    Procedure: UGI ENDO, INCLUDE ESOPHAGUS, STOMACH, & DUODENUM &/OR JEJUNUM; DX W/WO COLLECTION SPECIMN, BY BRUSH OR WASH;  Surgeon: Carmon Ginsberg, MD;  [facility];  Service: Gastroenterology   ??? PR UPPER GI ENDOSCOPY,DIAGNOSIS N/A 05/02/2019    Procedure: UGI ENDO, INCLUDE ESOPHAGUS, STOMACH, & DUODENUM &/OR JEJUNUM; DX W/WO COLLECTION SPECIMN, BY BRUSH OR WASH;  Surgeon: Chriss Driver, MD;  [facility]: [facility];  Service: Gastroenterology   ??? TOTAL ABDOMINAL HYSTERECTOMY     ??? TUBAL LIGATION Social History:     Social History     Socioeconomic History   ??? Marital status: Married     Spouse name: None   ??? Number of children: None   ??? Years of education: None   ??? Highest education level: None   Occupational History   ??? None   Social Needs   ??? Financial resource strain: None   ??? Food insecurity     Worry: None     Inability: None   ??? Transportation needs     Medical: None     Non-medical: None   Tobacco Use   ??? Smoking status: Never Smoker   ??? Smokeless tobacco: Never Used   Substance and Sexual Activity   ??? Alcohol use: No     Alcohol/week: 0.0 standard drinks   ??? Drug use: No   ??? Sexual activity: Yes     Partners: Male   Lifestyle   ??? Physical activity     Days per week: None     Minutes per session: None   ??? Stress: None   Relationships   ??? Social Wellsite geologist on phone: None     Gets together: None     Attends religious service: None     Active member of club or organization: None     Attends meetings of clubs or organizations: None     Relationship status: None   Other Topics Concern   ??? Do you use sunscreen? Yes   ??? Tanning bed use? No   ??? Are you easily burned? No   ??? Excessive sun exposure? No   ??? Blistering sunburns? No   Social History Narrative   ??? None       Family History:     Family History   Problem Relation Age of Onset   ??? Cancer Mother    ??? Hypertension Mother    ??? Heart disease Father    ??? Depression Son    ??? Mental illness Son    ??? Aneurysm Maternal Aunt    ??? Melanoma Neg Hx    ??? Basal cell carcinoma Neg Hx    ??? Squamous cell carcinoma Neg Hx    ??? Drug abuse Neg Hx    ??? Stroke Neg Hx        Allergies:     Aspirin, Fish containing products, Iodine, and Shellfish containing products    Current Medications:     Current Outpatient Medications   Medication Sig Dispense Refill   ??? belimumab 200 mg/mL AtIn Inject the contents of 1 syringe (200 mg) under the  skin every seven (7) days. 4 mL 2 ??? buPROPion (WELLBUTRIN XL) 150 MG 24 hr tablet Take 1 tablet (150 mg total) by mouth every morning. 30 tablet 2   ??? famotidine (PEPCID) 40 MG tablet Take 1 tablet (40 mg total) by mouth nightly as needed for heartburn. 90 tablet 3   ??? hydrOXYchloroQUINE (PLAQUENIL) 200 mg tablet Take 1 tablet (200 mg total) by mouth Two (2) times a day. 60 tablet 1   ??? sulfamethoxazole-trimethoprim (BACTRIM DS) 800-160 mg per tablet Take 1 tablet (160 mg of trimethoprim total) by mouth Two (2) times a day for 3 days. 6 tablet 0     No current facility-administered medications for this visit.        I have reviewed and (if needed) updated the patient's problem list, medications, allergies, past medical and surgical history, social and family history.    ROS:     Constitutional: Denies excessive fatigue, fever, chills, sweats, or lightheadedness.  Eyes: Denies blurred or changing vision, eye pain, or irritation.  ENT: Denies ear pain, changes in hearing, nasal/sinus problems, sneezing, or sore throat.  Respiratory: Denies cough, SOB, wheezing, or DOE.  Cardiovascular: Denies chest pain/pressure, palpitations, syncope, or edema.  Gastrointestinal: Denies nausea/vomiting, constipation, abdominal pain, bloody stool, diarrhea, or reflux.  Genitourinary:+urinary frequency, +dysuria,+ urgency, denies incontinence, denies back pain, no vag discharge.  Musculoskeletal: Denies new pain or swelling in joints, or new or unusual muscle pain  Skin/Nails/Hair: Denies rash, itching, hair loss, new/changing lesions, or slow-healing wounds.  Neurological: Denies numbness, tingling, weakness, balance problems, or new headaches  Mental Health: Denies sleep disturbance, depression, anxiety, suicidal ideation, or   substance abuse.  Endocrine: Denies excessive urination or thirst, unexpected weight change, or heat/cold intolerance.  Hematologic: Denies unusual bleeding/bruising or lymphadenopathy. Allergy: Denies seasonal symptoms, sneezing, itchy/watery eyes, hives, or lip swelling.    Vital Signs:     Wt Readings from Last 3 Encounters:   05/12/19 95.3 kg (210 lb)   05/12/19 90.7 kg (200 lb)   04/28/19 90.7 kg (200 lb)     Temp Readings from Last 3 Encounters:   05/12/19 36.7 ??C (98.1 ??F)   05/02/19 36.8 ??C (98.2 ??F) (Temporal)   03/07/19 36.3 ??C (97.3 ??F) (Tympanic)     BP Readings from Last 3 Encounters:   05/12/19 123/81   05/02/19 102/60   03/07/19 119/57     Pulse Readings from Last 3 Encounters:   05/12/19 60   05/02/19 56   03/07/19 59     Body mass index is 39.68 kg/m??.     Physical Exam:     Vital signs reviewed  In General- well appearing, in NAD  Head-normocephalic atraumatic  Oral mucosa is moist, tongue normal.  Posterior pharynx, no exudate, no cobblestoning  Tonsils no exudate, no erythema, no edema, not enlarged  Neck- supple, no LAD  Thyroid- normal size, no masses palpated  CV- RRR no rubs or murmurs  Lungs - clear bilaterally to ascultation.   Back - No CVA tenderness  Abdomen- soft, non tender, non distended, bowel sounds heard in all 4 quadrants, no organomegaly, no masses palpated, no suprapubic tenderness  Extremities- no clubbing, cyanosis, or edema  Skin- no concerning lesions, rashes observed    Labs:     Office Visit on 05/12/2019   Component Date Value Ref Range Status   ??? Color, UA 05/12/2019 Yellow   Final   ??? Clarity, UA 05/12/2019 Clear   Final   ??? Glucose,  UA 05/12/2019 Negative  Negative Final   ??? Bilirubin, UA 05/12/2019 Negative  Negative Final   ??? Ketones, POC 05/12/2019 Negative  Negative Final   ??? Spec Grav, UA 05/12/2019 1.025  1.005 - 1.030 Final   ??? Blood, UA 05/12/2019 Negative  Negative Final   ??? pH, UA 05/12/2019 6.0  5.0 - 9.0 Final   ??? Protein, UA 05/12/2019 Negative  Negative Final   ??? Urobilinogen, UA 05/12/2019 0.2 mg/dL  Negative (0.2 mg/dL) Final   ??? Leukocytes, UA 05/12/2019 Negative  Negative Final   ??? Nitrite, UA 05/12/2019 Negative  Negative Final ??? STRIP LOT NUMBER 05/12/2019 6,019   Final   ??? STRIP LOT EXPIRATION 05/12/2019 161,096   Final   Office Visit on 04/29/2019   Component Date Value Ref Range Status   ??? SARS-CoV-2 PCR 04/29/2019 Not Detected  Not Detected Final       Follow-up:     Recommend well adult check/physical in 1 year. Otherwise, follow up as below.  Return if symptoms worsen or fail to improve.    Health maintenance reviewed and recommendations made based on [place] Preventative Task Force (USPTF) recommendations. Reviewed appropriate diet and exercise. Patient stated understanding and there were no barriers to learning.

## 2019-05-12 NOTE — Unmapped (Signed)
1) Practice distraction when stress are elevated, daily  (100%)    Read   To be reviewed at next appointment     New Goal     2) Choose fruits and vegetables - as snacks- daily    Purchase sweets in single serving containers    To be reviewed at next appointment   Add a cheese stick or nuts to reduce hunger at lunch

## 2019-05-13 NOTE — Unmapped (Signed)
UA neg however pt has classic sx of UTI--increased freq, dysuria. Denies fevers. Pt states that she has had UTI in the past with similar sx and neg UA. I offered to get urine cultures and hold off on abx. Pt requested to start abx now.

## 2019-05-26 DIAGNOSIS — M329 Systemic lupus erythematosus, unspecified: Principal | ICD-10-CM

## 2019-05-26 NOTE — Unmapped (Signed)
[facility] Refill Coordination Note    Specialty Medication(s) to be Shipped:   Inflammatory Disorders: Benlysta    Other medication(s) to be shipped: n/a     Tonya Brady, DOB: 15-Nov-1975  Phone: [phone] (home)       All above HIPAA information was verified with patient.     Completed refill call assessment today to schedule patient's medication shipment from the [facility] [phone]).       Specialty medication(s) and dose(s) confirmed: Regimen is correct and unchanged.   Changes to medications: Linsi reports no changes reported at this time.  Changes to insurance: No  Questions for the pharmacist: No    Confirmed patient received Welcome Packet with first shipment. The patient will receive a drug information handout for each medication shipped and additional FDA Medication Guides as required.       DISEASE/MEDICATION-SPECIFIC INFORMATION        For patients on injectable medications: Patient currently has 0 doses left.  Next injection is scheduled for 05/29/19.    SPECIALTY MEDICATION ADHERENCE                Benlysta 200 mg/ml: 0 days of medicine on hand       SHIPPING     Shipping address confirmed in Epic.     Delivery Scheduled: Yes, Expected medication delivery date: 05/28/19.  However, Rx request for refills was sent to the provider as there are none remaining.     Medication will be delivered via Same Day Courier to the prescription address in Epic WAM.    Ruthellen Tippy Vangie Bicker   [facility] Specialty Pharmacist

## 2019-05-27 DIAGNOSIS — M329 Systemic lupus erythematosus, unspecified: Principal | ICD-10-CM

## 2019-05-27 MED ORDER — BENLYSTA 200 MG/ML SUBCUTANEOUS AUTO-INJECTOR
SUBCUTANEOUS | 2 refills | 28 days | Status: CP
Start: 2019-05-27 — End: ?
  Filled 2019-05-29: qty 4, 28d supply, fill #0

## 2019-05-27 NOTE — Unmapped (Signed)
Benlysta refill  Last ov: 02/10/2019  Next ov: 08/11/2019     Labs:   AST   Date Value Ref Range Status   03/07/2019 23 14 - 38 U/L Final   07/26/2010 19 14 - 38 U/L Final     ALT   Date Value Ref Range Status   03/07/2019 25 <35 U/L Final   07/26/2010 33 15 - 48 U/L Final     Creatinine/CP   Date Value Ref Range Status   05/29/2011 0.75 0.60 - 1.00 MG/DL Final     Creatinine   Date Value Ref Range Status   03/07/2019 0.76 0.60 - 1.00 mg/dL Final   16/02/9603 5.40 0.60 - 1.00 mg/dL Final     WBC   Date Value Ref Range Status   03/07/2019 3.6 (L) 4.5 - 11.0 10*9/L Final   06/30/2013 2.4 (L) 4.5 - 11.0 10*9/L Final     HGB   Date Value Ref Range Status   03/07/2019 13.6 12.0 - 16.0 g/dL Final   98/03/9146 82.9 12.0 - 16.0 g/dL Final     HCT   Date Value Ref Range Status   03/07/2019 41.8 36.0 - 46.0 % Final   06/30/2013 39.2 36.0 - 46.0 % Final     MCV   Date Value Ref Range Status   03/07/2019 93.9 80.0 - 100.0 fL Final   06/30/2013 89 80 - 100 fL Final     RDW   Date Value Ref Range Status   03/07/2019 13.0 12.0 - 15.0 % Final   06/30/2013 13.3 12.0 - 15.0 % Final     Platelet   Date Value Ref Range Status   03/07/2019 149 (L) 150 - 440 10*9/L Final   06/30/2013 149 (L) 150 - 440 10*9/L Final     Neutrophils %   Date Value Ref Range Status   02/13/2019 68.4 % Final     Lymphocytes %   Date Value Ref Range Status   02/13/2019 21.1 % Final     Monocytes %   Date Value Ref Range Status   02/13/2019 6.2 % Final     Eosinophils %   Date Value Ref Range Status   02/13/2019 2.0 % Final     Basophils %   Date Value Ref Range Status   02/13/2019 0.3 % Final

## 2019-05-28 NOTE — Unmapped (Signed)
Tonya Brady 's Benlysta shipment will be sent out  as a result of a high copay.     I have reached out to the patient and communicated the delivery change. We will reschedule the medication for the delivery date that the patient agreed upon.  We have confirmed the delivery date as 05/29/2019, via same day courier.

## 2019-05-29 MED FILL — BENLYSTA 200 MG/ML SUBCUTANEOUS AUTO-INJECTOR: 28 days supply | Qty: 4 | Fill #0 | Status: AC

## 2019-05-30 ENCOUNTER — Ambulatory Visit
Admit: 2019-05-30 | Discharge: 2019-05-31 | Payer: PRIVATE HEALTH INSURANCE | Attending: Retina Specialist | Primary: Retina Specialist

## 2019-05-30 DIAGNOSIS — Z79899 Other long term (current) drug therapy: Principal | ICD-10-CM

## 2019-05-30 NOTE — Unmapped (Signed)
No Plaquenil toxicity seen on exam or imaging today. Patient has been on Plaquenil for 10 years 400 mg per day. She has Sjorgens's and lupus. 400 mg has been controlling her rashes. Taking 4.1 mg/kg/day    Still has nausea with 400 mg per day. Has been taking famotidine and zofran which helps most of the time.     year OCT HVF , FAF    EXTENDED OPHTHALMOSCOPY INTERPRETATION MACULA (90D lens):  syneresis OU

## 2019-06-05 ENCOUNTER — Ambulatory Visit: Admit: 2019-06-05 | Discharge: 2019-06-06 | Payer: PRIVATE HEALTH INSURANCE

## 2019-06-06 DIAGNOSIS — K7460 Unspecified cirrhosis of liver: Principal | ICD-10-CM

## 2019-06-06 DIAGNOSIS — K7581 Nonalcoholic steatohepatitis (NASH): Principal | ICD-10-CM

## 2019-06-06 NOTE — Unmapped (Signed)
HCC screening shows no masses. Repeat in 6 months with same-day clinic visit.

## 2019-06-13 ENCOUNTER — Telehealth
Admit: 2019-06-13 | Discharge: 2019-06-14 | Payer: PRIVATE HEALTH INSURANCE | Attending: Family Medicine | Primary: Family Medicine

## 2019-06-13 DIAGNOSIS — E669 Obesity, unspecified: Principal | ICD-10-CM

## 2019-06-13 DIAGNOSIS — G4700 Insomnia, unspecified: Principal | ICD-10-CM

## 2019-06-13 MED ORDER — NALTREXONE 50 MG TABLET
ORAL_TABLET | Freq: Two times a day (BID) | ORAL | 2 refills | 60.00000 days | Status: CP
Start: 2019-06-13 — End: ?

## 2019-06-13 NOTE — Unmapped (Addendum)
Referred by: [name], MD in [facility].    GOALS: Add more vegetables in diet. Work on reducing sugary drinks and sweeteners. Start indoor resistance training.    I have reviewed the patient's medical history, lifestyle history and labs/tests.   My recommendations include the following:    Diet quality and patterning: Already established with our nutritionist, Tonya Brady, and making good improvements. Endorses good portion control and having lots of fruits and vegetables in her meal/snack. However the weakness is stress eating on sweets (eg. Tonya Brady, [employer]). She drinks plenty of sugary beverages such as lemon water, diet soda, juice, coffee with splenda and creamer. She does not meet the criteria for binge eating disorder.  -- Will meet regularly with our nutritionist during the duration of medical management treatment. Discussed about healthier food choices and advised to home cook for less processed meals.    Exercise: She walks every day 30 min but can't go outside when it's cold outside due to Sjogren's sx.  -- Goal of 30 minutes of exercise 5x a week or a total of 150 minutes weekly.    Sleep: Currently sleeping 6 hours without sleep aids. +OSA screening (STOP-BANG 3/8). Pt declined to do sleep study at the moment as there are many things going on in her life and wishes to focus on aggressive lifestyle change.  -- Recommend 7-8 hours of sleep at night. Advised to try melatonin 10 mg for insomnia.     Stress management: Current stressors: domestic, health, world news, finance. She has an established therapist.  -- Recommend to try relaxation techniques such as breathing techniques, listening to music to help sleep at night and yoga stretching throughout the day to help control stress.     Weight gain causing medication: None.   Hx of long term steroid use (prednisone 80 mg daily for lupus).  Hx of Ambien for insomnia years ago. Medication: Currently taking wellbutrin 150 mg daily for depression -- no weight loss. Pt tried contrave in the past and lost about 30 lb but stopped when she had s/e (dizziness, lightheaded). Pt wishes to try contrave again. As she is already on wellbutrin, we will add Naltrexone to her current regimen. 1/2 tab daily and up to twice daily in 2 weeks if tolerable. Instruction given; side effect profile discussed.   CONTINUE wellbutrin 150 mg daily. START naltrexone 25 mg (1/2 tab) daily for 2 weeks and go up to twice daily (total of 50 mg daily).   Karie Mainland Alan Mulder: tried and failed.  - Topamax: contraindicated by hx of kidney stone.  - Phentermine: contraindicated by hx of palpitations.  - GLP1: will avoid due to strong family hx of thyroid cancer.  06/13/19 - started naltrexone 25 mg, titrate up to 50 mg in 2 weeks (209 lb)    Obesity Surgery: Declines for now. Wishes to try medical management but will consider in the future. Pt is very open to this option and wishes to take aggressive measures to prevent progression of cirrhosis. May consider sending out a referral after 6th visit.    Medical conditions:  Glaucoma: no  Seizures: no  Medullary thyroid cancer (personal or family hx): Father and PGM both had thyroid cancer and  thyroidectomy but they are both deceased so pt couldn't tell what type. Will avoid GLP-1 agonists.  Multiple Endocrine Neoplasia: no  Palpitations/Tachycardia: YES  Chest Pain: no past history of MI.   Headaches/Migraines: YES  Nephrolithiasis: YES  H/o pancreatitis: YES, due to gallbladder and  she had cholecystectomy.  GERD: YES    Prior Surgeries:  Choleycystectomy: YES  Hysterectomy: YES    Obesity-associated comorbidities: Cirrhosis, GERD, suspected sleep apnea.  Current barriers include: Anxiety, depression, Sjogren's, lupus, emotional /stress eating, insomnia, fibromyalgia.  Birth Control Methods: N/A; s/p hysterectomy.    Weight Summary: 1. Starting weight/BMI: 209 lb, 5' 1 -- BMI 39.6 (06/13/19 self reported)  2. Target weight/goal: 160 lb.  3. Today's weight/BMI: N/A  4. % Body weight loss: N/A  5. Waist Circumference: N/A

## 2019-06-13 NOTE — Unmapped (Signed)
[facility]    TeleHealth Video Encounter (Doximity)  This medical encounter was conducted virtually using [email or web address]  TeleHealth protocols.    I have identified myself to the patient and conveyed my credentials to Tonya Brady  In case we get disconnected, patient's phone number is [phone] (home)    Patient has signed informed consent on file in medical record.  Is there someone else in the room? No.     Assessment/Plan:     Problem List Items Addressed This Visit        Other    Class 2 obesity - Primary     Referred by: [name], MD in [facility].    GOALS: Add more vegetables in diet. Work on reducing sugary drinks and sweeteners. Start indoor resistance training.    I have reviewed the patient's medical history, lifestyle history and labs/tests.   My recommendations include the following:    Diet quality and patterning: Already established with our nutritionist, [name], and making good improvements. Endorses good portion control and having lots of fruits and vegetables in her meal/snack. However the weakness is stress eating on sweets (eg. Hershye's, M&M). She drinks plenty of sugary beverages such as lemon water, diet soda, juice, coffee with splenda and creamer. She does not meet the criteria for binge eating disorder.  -- Will meet regularly with our nutritionist during the duration of medical management treatment. Discussed about healthier food choices and advised to home cook for less processed meals.    Exercise: She walks every day 30 min but can't go outside when it's cold outside due to Sjogren's sx.  -- Goal of 30 minutes of exercise 5x a week or a total of 150 minutes weekly.    Sleep: Currently sleeping 6 hours without sleep aids. +OSA screening (STOP-BANG 3/8). Pt declined to do sleep study at the moment as there are many things going on in her life and wishes to focus on aggressive lifestyle change.  -- Recommend 7-8 hours of sleep at night. Advised to try melatonin 10 mg for insomnia.     Stress management: Current stressors: domestic, health, world news, finance. She has an established therapist.  -- Recommend to try relaxation techniques such as breathing techniques, listening to music to help sleep at night and yoga stretching throughout the day to help control stress.     Weight gain causing medication: None.   Hx of long term steroid use (prednisone 80 mg daily for lupus).  Hx of Ambien for insomnia years ago.    Medication: Currently taking wellbutrin 150 mg daily for depression -- no weight loss. Pt tried contrave in the past and lost about 30 lb but stopped when she had s/e (dizziness, lightheaded). Pt wishes to try contrave again. As she is already on wellbutrin, we will add Naltrexone to her current regimen. 1/2 tab daily and up to twice daily in 2 weeks if tolerable. Instruction given; side effect profile discussed.   CONTINUE wellbutrin 150 mg daily. START naltrexone 25 mg (1/2 tab) daily for 2 weeks and go up to twice daily (total of 50 mg daily).   Karie Mainland Alan Mulder: tried and failed.  - Topamax: contraindicated by hx of kidney stone.  - Phentermine: contraindicated by hx of palpitations.  - GLP1: will avoid due to strong family hx of thyroid cancer.  06/13/19 - started naltrexone 25 mg, titrate up to 50 mg in 2 weeks (209 lb)    Obesity Surgery: Declines for now. Wishes  to try medical management but will consider in the future. Pt is very open to this option and wishes to take aggressive measures to prevent progression of cirrhosis. May consider sending out a referral after 6th visit.    Medical conditions:  Glaucoma: no  Seizures: no  Medullary thyroid cancer (personal or family hx): Father and PGM both had thyroid cancer and  thyroidectomy but they are both deceased so pt couldn't tell what type. Will avoid GLP-1 agonists.  Multiple Endocrine Neoplasia: no  Palpitations/Tachycardia: YES  Chest Pain: no past history of MI.   Headaches/Migraines: YES Nephrolithiasis: YES  H/o pancreatitis: YES, due to gallbladder and she had cholecystectomy.  GERD: YES    Prior Surgeries:  Choleycystectomy: YES  Hysterectomy: YES    Obesity-associated comorbidities: Cirrhosis, GERD, suspected sleep apnea.  Current barriers include: Anxiety, depression, Sjogren's, lupus, emotional /stress eating, insomnia, fibromyalgia.  Birth Control Methods: N/A; s/p hysterectomy.    Weight Summary:  1. Starting weight/BMI: 209 lb, 5' 1 -- BMI 39.6 (06/13/19 self reported)  2. Target weight/goal: 160 lb.  3. Today's weight/BMI: N/A  4. % Body weight loss: N/A  5. Waist Circumference: N/A           Insomnia     Pt used ambien years ago for insomnia but she hasn't been relying on medication recently. Pt still endorses occasional insomnia so we advised to try dissolvable melatonin 10 mg. Will discuss w/ PCP in the future.                Return in about 4 weeks (around 07/11/2019) for Weight clinic F/U one slot. well 150, naltrexone 25 bid. inperson. cirrhosis. .    Counseling     I spent 45 minutes on the real-time audio and video with the patient on the date of service. I spent an additional 15 minutes on pre- and post-visit activities.     The patient was physically located in [place] or a state in which I am permitted to provide care. The patient and/or parent/guardian understood that s/he may incur co-pays and cost sharing, and agreed to the telemedicine visit. The visit was reasonable and appropriate under the circumstances given the patient's presentation at the time.    The patient and/or parent/guardian has been advised of the potential risks and limitations of this mode of treatment (including, but not limited to, the absence of in-person examination) and has agreed to be treated using telemedicine. The patient's/patient's family's questions regarding telemedicine have been answered.     If the visit was completed in an ambulatory setting, the patient and/or parent/guardian has also been advised to contact their provider???s office for worsening conditions, and seek emergency medical treatment and/or call 911 if the patient deems either necessary.         HPI:     Tonya Brady is a 44 y.o. female who presents  for a video visit for [facility] establishment amidst COVID-19 outbreak.    Tonya Brady is a 44 y.o. female Body mass index is 39.62 kg/m??.  Primary reason for wanting obesity treatment : physician recommendation  Motivation:improvement in health conditions  Overall goal:50 lb loss.  Treatment of interest: lifestyle , pharmacologic and surgical options.  Weight History:  Highest adult weight: 232 lb   Lowest adult weight on a diet or weight loss program: 183 lb  Lowest usual adult weight: 210 lb  Patients own description of the chronology of weight gain:  Evidence of  strong genetic component: Medium  Other contributors to weight gain: Past medications associated weight gain  Developmental contributions: Medium        Weight Hx: Normal birth weight & childhood weight. She gradually gained weight starting at the 44 y/o, with DX of lupus. She was put on high dose steroids (prednisone 80 mg) for a long term.    Family Hx: Noncontributory.    Exercise: she walks 30 min every day but 3-4 times a week during winter time -- states that her face and extremities get swollen and painful due to Sjogren's.     Social: No cig / etoh. Multiple jobs and always busy on feet.    Dietary:     Diet: Already established with our nutritionist, [name], and making good improvements with lots of fruits and vegetables. Endorses improving portion control. Weakness is stress eating on sweets (eg. whole bag of M&M). Drink lemon water, coffee with splenda and creamer, diet soda, and other sugary beverages. Denies binge eating but endorses emotional eating.      Dieting History:  What diets worked well for you in the past? Contrave & dietary control.  Maximum weight lost and how long was it maintained? 30 lb for 6 months.  Are you currently working with a RD? yes,  already established with [name].    Current Lifestyle Patterns  Overview of typical 24 hr recall:   - Breakfast: banana, cereal, lowfat milk.  - Lunch: spinach, [place], lettuce, tomato, onion.  - Dinner: [place], breast, peas.  Excessive hunger within 1-2 hrs of having a regular meal:yes,    Eating behaviors:  Eating when not hungry yes,    Eating for comfort when stressed or emotional yes,    Times when you are eating and it literally feels like you can't stop: yes,    Did you ever try to manage your weight by vomiting, using laxatives, diuretics, or excessive exercise? no  Ever find evidence that you have been eating when you were asleep:no  Eat late at night or wake up at night and eat: yes,      Physical Activity:     Job related activity : constantly moving  Exercise regularly: yes  Exercise type / duration/ frequency: walk 30 min daily. 3-4 days per week during winter time.  What are the barriers to exercise: Can't walk in cold weather due to Sjogren's.  What type of activities do you enjoy: walking.    Medications:     Current medications with potential to cause weight gain:no    Sleep:     Average sleep duration: 6 hr.   Goes to sleep at 12-1 am and wakes up at 8 am.   Number of times she wakes up at night: 2x for bathroom.   Last drink of the day at 8-9 pm.    STOP-BANG  Score : 3 /8    Stress:     Perceived stress level during the last year: 8 /10,   How do you think that the stress is affecting you: read, walk.  Cause for stress: domestic, financial, world issues, health concerns.    Mental Health History(emotional health):     Any thoughts about harming yourself or wanting to die: no  Engaged in any self harming behaviors e.g. cutting yourself : no  Have you been to the ER or hospitalized for mental health reasons :no  Any alcohol or substance abuse, including prescription abuse : no     PHQ-9 Score: PHQ-9  TOTAL SCORE: 4, GAD-7 Score:   N/A    Past Medical and Surgical History:     Active Ambulatory Problems     Diagnosis Date Noted   ??? Systemic lupus erythematosus (CMS-HCC) 01/19/2011   ??? Allergic rhinitis 01/20/2011   ??? Secondary Sjogren's syndrome (CMS-HCC) 03/09/2013   ??? Fibromyalgia 03/09/2013   ??? Restless leg syndrome 06/30/2013   ??? Thrombocytopenia (CMS-HCC) 11/10/2015   ??? Palpitations 11/11/2015   ??? Morbid obesity due to excess calories (CMS-HCC) 02/10/2016   ??? Chronic abdominal pain 02/10/2016   ??? 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 Moderate episode of recurrent major depressive disorder (CMS-HCC) 07/10/2016   ??? Gastroesophageal reflux disease 08/09/2016   ??? GAD (generalized anxiety disorder) 08/09/2016   ??? Acute left ankle pain 05/29/2017   ??? Acute carpal tunnel syndrome of left wrist 11/08/2017   ??? Annual physical exam 11/28/2017   ??? Acquired trigger finger 09/19/2018   ??? Swelling of right parotid gland 11/11/2018   ??? Swelling of left parotid gland 12/23/2018   ??? Acute cystitis without hematuria 05/12/2019   ??? Class 2 obesity 06/13/2019   ??? Insomnia 06/13/2019     Resolved Ambulatory Problems     Diagnosis Date Noted   ??? Needs flu shot 03/10/2013   ??? Transaminitis 11/15/2015   ??? Bronchitis 04/09/2016   ??? Acute pain of right shoulder 07/10/2016   ??? Prediabetes 08/09/2016   ??? Viral URI with cough 02/19/2017   ??? Need for prophylactic vaccination against hepatitis A and hepatitis B in adult 05/29/2017     Past Medical History:   Diagnosis Date   ??? Bulging lumbar disc 2005   ??? Cirrhosis of liver (CMS-HCC)    ??? Difficult intravenous access    ??? NASH (nonalcoholic steatohepatitis)    ??? Valvular regurgitation        Past Surgical History:   Procedure Laterality Date   ??? CARPAL TUNNEL RELEASE     ??? CESAREAN SECTION     ??? CHOLECYSTECTOMY     ??? HERNIA REPAIR     ??? PR REVISE MEDIAN N/CARPAL TUNNEL SURG Left 12/30/2018    Procedure: R16 NEUROPLASTY AND/OR TRANSPOSITION; MEDIAN NERVE AT CARPAL TUNNEL;  Surgeon: Theodora Blow Jacqlyn Krauss, MD;  [facility]: [facility];  Service: Orthopedics   ??? PR SALIVARY SURG UNLISTED PROC Right 12/06/2018    Procedure: R22 UNLISTED PROC SALIVARY GLANDS/DUCTS;  Surgeon: Michelle Piper, MD;  Location: [facility] OR [facility];  Service: ENT   ??? PR SALIVARY SURG UNLISTED PROC Left 02/18/2019    Procedure: R25 UNLISTED PROC SALIVARY GLANDS/DUCTS;  Surgeon: Michelle Piper, MD;  Location: [facility];  Service: ENT   ??? PR UPPER GI ENDOSCOPY,DIAGNOSIS N/A 05/10/2016    Procedure: UGI ENDO, INCLUDE ESOPHAGUS, STOMACH, & DUODENUM &/OR JEJUNUM; DX W/WO COLLECTION SPECIMN, BY BRUSH OR WASH;  Surgeon: Carmon Ginsberg, MD;  [facility]: [facility];  Service: Gastroenterology   ??? PR UPPER GI ENDOSCOPY,DIAGNOSIS N/A 05/02/2019    Procedure: UGI ENDO, INCLUDE ESOPHAGUS, STOMACH, & DUODENUM &/OR JEJUNUM; DX W/WO COLLECTION SPECIMN, BY BRUSH OR WASH;  Surgeon: Chriss Driver, MD;  [facility]: [facility];  Service: Gastroenterology   ??? TOTAL ABDOMINAL HYSTERECTOMY     ??? TUBAL LIGATION         Patients previous medical records from [facility] reviewed and summarized in the Obesity Medicine Evaluation Summary     Medication list:  Current Outpatient Medications:   ???  belimumab (BENLYSTA) 200 mg/mL AtIn, Inject the contents of 1 syringe (200 mg) under the skin every seven (7) days., Disp: 4 mL, Rfl: 2  ???  buPROPion (WELLBUTRIN XL) 150 MG 24 hr tablet, Take 1 tablet (150 mg total) by mouth every morning., Disp: 30 tablet, Rfl: 2  ???  famotidine (PEPCID) 40 MG tablet, Take 1 tablet (40 mg total) by mouth nightly as needed for heartburn., Disp: 90 tablet, Rfl: 3  ???  hydrOXYchloroQUINE (PLAQUENIL) 200 mg tablet, Take 1 tablet (200 mg total) by mouth Two (2) times a day., Disp: 60 tablet, Rfl: 1  ???  ibuprofen (MOTRIN) 800 MG tablet, ibuprofen 800 mg tablet, Disp: , Rfl:   ???  naltrexone (DEPADE) 50 mg tablet, Take 0.5 tablets (25 mg total) by mouth two (2) times a day. First two weeks---start 0.5 tablet once daily., Disp: 60 tablet, Rfl: 2  Above medication list reviewed and updated.    Medications     Previous use of anti-obesity medications:  Phentermine: no  Metformin: no  Topiramate: no  Bupropion: yes  No weight loss and side effects: n/a  Liraglutide: no  SGLT-2 inhibitor: no  Orlistat :no  Other agents: Contrave -- 29 lb, s/e: dizziness and lightheadedness.    Relevant symptom review:     Glaucoma: no  Palpitations: yes,    Chest Pain: no  Headaches: yes,    Nephrolithiasis: yes,    Seizures: no  H/o pancreatitis: yes,    Personal or family history of medullary cancer of thyroid:yes,      Musculoskeletal pain no  Urinary incontinence no  Heartburn no     Symptoms of cushings disease: no    Irregular menstrual cycles: N/A s/p hysterectomy  Excessive facial hair:N/A s/p hysterectomy    H/o head trauma: no  H/o radiation to the brain: no    Female patient ROS:     Last menstrual period: 2008. .  Current contraceptive use: n/a; hysterectomy.    All other systems reviewed: Yes .    Effect on quality of life:     Activities limited by the weight: n/a .    Relevant Family History:       Family History   Problem Relation Age of Onset   ??? Cancer Mother    ??? Hypertension Mother    ??? Heart disease Father    ??? Depression Son    ??? Mental illness Son    ??? Aneurysm Maternal Aunt    ??? Melanoma Neg Hx    ??? Basal cell carcinoma Neg Hx    ??? Squamous cell carcinoma Neg Hx    ??? Drug abuse Neg Hx    ??? Stroke Neg Hx      .      Social History     Socioeconomic History   ??? Marital status: Married     Spouse name: None   ??? Number of children: None   ??? Years of education: None   ??? Highest education level: None   Occupational History   ??? None   Social Needs   ??? Financial resource strain: None   ??? Food insecurity     Worry: None     Inability: None   ??? Transportation needs     Medical: None     Non-medical: None   Tobacco Use   ??? Smoking status: Never Smoker   ??? Smokeless tobacco: Never Used   Substance and Sexual  Activity   ??? Alcohol use: No     Alcohol/week: 0.0 standard drinks   ??? Drug use: No   ??? Sexual activity: Yes     Partners: Male   Lifestyle   ??? Physical activity     Days per week: None     Minutes per session: None   ??? Stress: None   Relationships   ??? Social Wellsite geologist on phone: None     Gets together: None     Attends religious service: None     Active member of club or organization: None     Attends meetings of clubs or organizations: None     Relationship status: None   Other Topics Concern   ??? Do you use sunscreen? Yes   ??? Tanning bed use? No   ??? Are you easily burned? No   ??? Excessive sun exposure? No   ??? Blistering sunburns? No   Social History Narrative   ??? None       Vital Signs:     Self reported vitals: wt=209 lb.    Physical Exam:     As part of this Video Visit, no in-person exam was conducted.  Video interaction permitted the following observations.    GEN: No acute distress.   SKIN: Color is normal. No rashes.  PSYCH: Appropriate affect, normal mood.  RESP: Normal work of breathing, no respiratory distress.    Labs:      Lab work reviewed in [facility] and is noted underneath    Previous studies were reviewed. Labs listed below.    Lab Results   Component Value Date    WBC 3.6 (L) 03/07/2019    RBC 4.45 03/07/2019    HGB 13.6 03/07/2019    HCT 41.8 03/07/2019    MCV 93.9 03/07/2019    MCH 30.6 03/07/2019    MCHC 32.6 03/07/2019    RDW 13.0 03/07/2019    PLT 149 (L) 03/07/2019     Lab Results   Component Value Date    A1C 5.4 02/13/2019     Lab Results   Component Value Date    TSH 2.500 11/10/2015     Lab Results   Component Value Date    CHOL 148 02/13/2019     Lab Results   Component Value Date    HDL 37 (L) 02/13/2019     No components found for: LDLCALC,  LDL,  LDLDIRECT  Lab Results   Component Value Date    TRIG 93 02/13/2019     No components found for: [facility]  Lab Results   Component Value Date    NA 142 03/07/2019    K 4.4 03/07/2019    CL 107 03/07/2019    CO2 23.0 03/07/2019    BUN 12 03/07/2019    CREATININE 0.76 03/07/2019    GFR >= 60 07/26/2010    GLU 101 03/07/2019    CALCIUM 9.3 03/07/2019    ALBUMIN 4.4 03/07/2019       ____________________________________________________________________      I attest that I, Namwoo J Cho, personally documented this note while acting as scribe for [employer], MD.      Francisco Capuchin J Cho, Scribe.  06/13/2019     The documentation recorded by the scribe accurately reflects the service I personally performed and the decisions made by me.    Marshell Garfinkel, MD

## 2019-06-13 NOTE — Unmapped (Signed)
Pt used ambien years ago for insomnia but she hasn't been relying on medication recently. Pt still endorses occasional insomnia so we advised to try dissolvable melatonin 10 mg. Will discuss w/ PCP in the future.

## 2019-06-14 NOTE — Unmapped (Deleted)
Nutrition Consult Note    Referring Provider:  Alvy Beal*    Reason for Referral:  No chief complaint on file.        Medical History:  Patient Active Problem List   Diagnosis   ??? Systemic lupus erythematosus (CMS-HCC)   ??? Allergic rhinitis   ??? Secondary Sjogren's syndrome (CMS-HCC)   ??? Fibromyalgia   ??? Restless leg syndrome   ??? Thrombocytopenia (CMS-HCC)   ??? Palpitations   ??? Morbid obesity due to excess calories (CMS-HCC)   ??? Chronic abdominal pain   ??? 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 Moderate episode of recurrent major depressive disorder (CMS-HCC)   ??? Gastroesophageal reflux disease   ??? GAD (generalized anxiety disorder)   ??? Acute left ankle pain   ??? Acute carpal tunnel syndrome of left wrist   ??? Annual physical exam   ??? Acquired trigger finger   ??? Swelling of right parotid gland   ??? Swelling of left parotid gland   ??? Acute cystitis without hematuria   ??? Class 2 obesity   ??? Insomnia       Barriers to Care:  acuity of illness    - Weight from last MD appointment   Present height    Present weight    Current BMI    ***    Weight Status Categories:  Underweight:  BMI < 18.5  Normal Weight:  BMI 18.5 - 24.9  Overweight:  BMI 25 - 29.9  Obesity Class I:  BMI 30 - 34.9  Obesity Class II:  BMI 35 - 39.9  Obesity Class III:  BMI ? 40    Weight History:  Wt Readings from Last 6 Encounters:   06/13/19 95.1 kg (209 lb 11.2 oz)   05/12/19 95.3 kg (210 lb)   05/12/19 90.7 kg (200 lb)   04/28/19 90.7 kg (200 lb)   03/07/19 90.7 kg (200 lb)   02/18/19 92 kg (202 lb 13.2 oz)       Allergies:   Allergies   Allergen Reactions   ??? Aspirin Swelling     Other reaction(s): SWELLING/EDEMA   ??? Fish Containing Products Diarrhea and Nausea And Vomiting   ??? Iodine Other (See Comments)     Pt told not to have d/t shellfish allergy   ??? Shellfish Containing Products Hives, Diarrhea and Nausea Only     Other reaction(s): HIVES  Other reaction(s): HIVES  Other reaction(s): NAUSEA Relevant Medications, Herbs, Supplements:    Current Outpatient Medications:   ???  belimumab (BENLYSTA) 200 mg/mL AtIn, Inject the contents of 1 syringe (200 mg) under the skin every seven (7) days., Disp: 4 mL, Rfl: 2  ???  buPROPion (WELLBUTRIN XL) 150 MG 24 hr tablet, Take 1 tablet (150 mg total) by mouth every morning., Disp: 30 tablet, Rfl: 2  ???  famotidine (PEPCID) 40 MG tablet, Take 1 tablet (40 mg total) by mouth nightly as needed for heartburn., Disp: 90 tablet, Rfl: 3  ???  hydrOXYchloroQUINE (PLAQUENIL) 200 mg tablet, Take 1 tablet (200 mg total) by mouth Two (2) times a day., Disp: 60 tablet, Rfl: 1  ???  ibuprofen (MOTRIN) 800 MG tablet, ibuprofen 800 mg tablet, Disp: , Rfl:   ???  naltrexone (DEPADE) 50 mg tablet, Take 0.5 tablets (25 mg total) by mouth two (2) times a day. First two weeks---start 0.5 tablet once daily., Disp: 60 tablet, Rfl: 2    Do you have any concerns about  taking or affording your medications?  None    Relevant Labs:  Hemoglobin A1C (%)   Date Value   02/13/2019 5.4   11/28/2017 5.2   10/10/2016 5.6   02/10/2016 5.9   06/23/2011 5.5      No components found for: LDLCALC   BP Readings from Last 3 Encounters:   05/12/19 123/81   05/02/19 102/60   03/07/19 119/57     Lab Results   Component Value Date    CHOL 148 02/13/2019    CHOL 160 11/28/2017    CHOL 165 06/23/2011     Lab Results   Component Value Date    HDL 37 (L) 02/13/2019    HDL 44 11/28/2017    HDL 35 (L) 06/23/2011     Lab Results   Component Value Date    LDL 92 02/13/2019    LDL 103 (H) 11/28/2017    LDL 104 06/23/2011     Lab Results   Component Value Date    VLDL 18.6 02/13/2019    VLDL 13.2 11/28/2017     Lab Results   Component Value Date    CHOLHDLRATIO 4.0 02/13/2019    CHOLHDLRATIO 3.6 11/28/2017     Lab Results   Component Value Date    TRIG 93 02/13/2019    TRIG 66 11/28/2017    TRIG 132 06/23/2011       No results found for: VITDTOTAL  Lab Results   Component Value Date    VITAMINB12 >1000 (H) 06/23/2011       Physical Activity:  Loves to walk   -- struggling to walk recently with legs   ***      24-Hour recall/usual intake:    Time Intake   Breakfast Cereal   Yogurt   Banana and Grapes     Yogurt - Apple or banana   ***       Snack (AM)    Lunch 5 chicken nuggets and 1/2 fries   -- salad and sandwich   -- bowl of soup   -- Raman     Pizza rolls     Grilled chicken salad   ***     Snack (PM)    Dinner [employer]   -- Spaghetti baked pork chops  Hamburger helper   Vegetables 1-2   [employer]ans or [employer] or creamed potatos     [employer] - burger 1/2 fries     Grilled Chicken and Green Beans   ***     Snack (HS)      Other Nutrition Information:  HISTORY Cirosis of the liver - 3 years ago   -- lots of scar tissues  - worked on weight loss   Got down to 195 lbs but reports started to regain the weight   -- daughter and kids moved in which has changed food     -- reports family had a COVID scare   Feels eating has been better     Notes that stress has been elevated   ***    USUAL INTAKES two-three meals   Sometimes skips breakfast   -- reduced portions   -- steamer meals on days she works   ***    SNACKING Does have some snacks around - pack of cookies or bag of chips   Bananas Grapes and Yogurt  Low Fat Tricots  ***    -- precut apple slices and celery and carrots   -- sometimes hershey kisses  at night   GROCERY SHOPPING 1-2 times a week   -8-9 people in the house  ***    COOKING Stove has been out this week   -- trying to cut down on bread pasta   ***    DINING OUT 1x per week   Sometimes twice   -- a few times a week   -- family eats a lot of hamburger helper  ***    BEVERAGES Water and diet coke   Coffee   Increased water intake   ***    INTOLERANCES Spaghetti sauce     SLEEP 6 is doing well     STRESS Very elevated   Struggles: Stress Eating - Peanut M and Ms    - eating the whole bag         Patient arrives for assistance with weight loss.  Reviewed dietary recall and physical activity.  Set goals for next appointment. Notes that she has struggled with stress.  Reports she is going a little better. ***        Estimated Daily Needs:  1510 calories (Mifflin-St Jeor equation for BMI > 25 using actual body weight, adjusted for weight loss)  72 g protein    Nutrition Diagnosis:  Overweight/obesity related to larger portions, limited physical activity and consumption of unbalanced meals as evidenced by BMI >30, 24 hour recall and no physical activity. ***      Nutrition Intervention:  Nutrition Counseling    Materials Provided:  N/A    Patient Stated Nutrition Goals:  1) Practice distraction when stress are elevated, daily  (100%)    Read   To be reviewed at next appointment   ***    New Goal     2) Choose fruits and vegetables - as snacks- daily    Purchase sweets in single serving containers    To be reviewed at next appointment   Add a cheese stick or nuts to reduce hunger at lunch   ***        Goals Added to Visit Navigator?  Yes    Monitoring/Evaluation:  Progress towards goals:  baseline    Expected Compliance is:   Readiness for change action stage    Follow-up:  2 weeks     Length of visit was *** minutes  *** unit(s) billed per insurance    Rita Ohara MS RD CDE         PCP: Alexandria Lodge, MD  Address on File: [address], [place] [address], [facility] [place]      Verified as Current Location: Yes  Extended Emergency Contact Information  Primary Emergency Contact: Minda Ditto [place]  Home Phone: [phone]  Relation: Spouse  Secondary Emergency Contact: Hatch,Bridgette   [place]  Home Phone: [phone]  Mobile Phone: [phone]  Relation: Daughter  Mother: Reggie Pile of [place]  Home Phone: [phone]     Verified Behavioral Health Emergency Contact: N/A    I spent *** minutes on the phone with the patient. I spent an additional 10 minutes on pre- and post-visit activities.     The patient was physically located in [place] or a state in which I am permitted to provide care. The patient and/or parent/guardian understood that s/he may incur co-pays and cost sharing, and agreed to the telemedicine visit. The visit was reasonable and appropriate under the circumstances given the patient's presentation at the time.  The patient and/or parent/guardian has been advised of the potential risks and limitations of this mode of treatment (including, but not limited to, the absence of in-person examination) and has agreed to be treated using telemedicine. The patient's/patient's family's questions regarding telemedicine have been answered.     If the visit was completed in an ambulatory setting, the patient and/or parent/guardian has also been advised to contact their provider???s office for worsening conditions, and seek emergency medical treatment and/or call 911 if the patient deems either necessary.

## 2019-06-16 NOTE — Unmapped (Signed)
[facility] Clinical Assessment & Refill Coordination Note    Tonya Brady, DOB: 04-Sep-1975  Phone: [phone] (home)     All above HIPAA information was verified with patient.     Was a Nurse, learning disability used for this call? No    Specialty Medication(s):   Inflammatory Disorders: Benlysta     Current Outpatient Medications   Medication Sig Dispense Refill   ??? belimumab (BENLYSTA) 200 mg/mL AtIn Inject the contents of 1 syringe (200 mg) under the skin every seven (7) days. 4 mL 2   ??? buPROPion (WELLBUTRIN XL) 150 MG 24 hr tablet Take 1 tablet (150 mg total) by mouth every morning. 30 tablet 2   ??? famotidine (PEPCID) 40 MG tablet Take 1 tablet (40 mg total) by mouth nightly as needed for heartburn. 90 tablet 3   ??? hydrOXYchloroQUINE (PLAQUENIL) 200 mg tablet Take 1 tablet (200 mg total) by mouth Two (2) times a day. 60 tablet 1   ??? ibuprofen (MOTRIN) 800 MG tablet ibuprofen 800 mg tablet     ??? naltrexone (DEPADE) 50 mg tablet Take 0.5 tablets (25 mg total) by mouth two (2) times a day. First two weeks---start 0.5 tablet once daily. 60 tablet 2     No current facility-administered medications for this visit.         Changes to medications: Chivonne reports no changes at this time.    Allergies   Allergen Reactions   ??? Aspirin Swelling     Other reaction(s): SWELLING/EDEMA   ??? Fish Containing Products Diarrhea and Nausea And Vomiting   ??? Iodine Other (See Comments)     Pt told not to have d/t shellfish allergy   ??? Shellfish Containing Products Hives, Diarrhea and Nausea Only     Other reaction(s): HIVES  Other reaction(s): HIVES  Other reaction(s): NAUSEA       Changes to allergies: No    SPECIALTY MEDICATION ADHERENCE     Benlysta 200mg /ml: 14 days of medicine on hand     Medication Adherence    Patient reported X missed doses in the last month: 0  Specialty Medication: Benlysta 200mg /ml          Specialty medication(s) dose(s) confirmed: Regimen is correct and unchanged.     Are there any concerns with adherence? No    Adherence counseling provided? Not needed    CLINICAL MANAGEMENT AND INTERVENTION      Clinical Benefit Assessment:    Do you feel the medicine is effective or helping your condition? Yes    Clinical Benefit counseling provided? Not needed    Adverse Effects Assessment:    Are you experiencing any side effects? No    Are you experiencing difficulty administering your medicine? No    Quality of Life Assessment:    How many days over the past month did your SLE and Sjogren's keep you from your normal activities? For example, brushing your teeth or getting up in the morning. Patient declined to answer    Have you discussed this with your provider? Not needed    Therapy Appropriateness:    Is therapy appropriate? Yes, therapy is appropriate and should be continued    DISEASE/MEDICATION-SPECIFIC INFORMATION      For patients on injectable medications: Patient currently has 2 doses left.  Next injection is scheduled for 06/19/2019.    PATIENT SPECIFIC NEEDS     ? Does the patient have any physical, cognitive, or cultural barriers? No    ?  Is the patient high risk? No     ? Does the patient require a Care Management Plan? No     ? Does the patient require physician intervention or other additional services (i.e. nutrition, smoking cessation, social work)? No      SHIPPING     Specialty Medication(s) to be Shipped:   Inflammatory Disorders: Benlysta 200mg /ml    Other medication(s) to be shipped: none       Changes to insurance: No    Delivery Scheduled: Yes, Expected medication delivery date: 06/26/2019.     Medication will be delivered via Same Day Courier to the confirmed prescription address in [facility].    The patient will receive a drug information handout for each medication shipped and additional FDA Medication Guides as required.  Verified that patient has previously received a Conservation officer, historic buildings.    All of the patient's questions and concerns have been addressed.    Karene Fry Stefhanie Kachmar   [facility] Specialty Pharmacist

## 2019-06-17 ENCOUNTER — Ambulatory Visit
Admit: 2019-06-17 | Discharge: 2019-06-18 | Payer: PRIVATE HEALTH INSURANCE | Attending: Registered" | Primary: Registered"

## 2019-06-17 ENCOUNTER — Ambulatory Visit: Admit: 2019-06-17 | Discharge: 2019-06-18 | Payer: PRIVATE HEALTH INSURANCE | Attending: Family | Primary: Family

## 2019-06-17 NOTE — Unmapped (Signed)
Assessment     Tonya Brady is a 44 y.o. female presenting to [facility] for COVID testing.     Problem List Items Addressed This Visit     None      Visit Diagnoses     Contact with and (suspected) exposure to other viral communicable diseases    -  Primary    Relevant Orders    COVID-19 PCR          Plan     If no testing performed, pt counseled on routine care for respiratory illness.  If testing performed, COVID sent.  Patient directed to Home given findings during today's visit.    Subjective     Tonya Brady is a 44 y.o. female who presents to the [facility] with complaints of the following:    Exposure History: In the last 21 days?     Have you traveled outside of [place]? No               Have you been in close contact with someone confirmed by a test to have COVID? (Close contact is within 6 feet for at least 10 minutes) Yes, Who? n/a       Have you worked in a health care facility? No     Lived or worked facility like a nursing home, group home, or assisted living?    No         Are you scheduled to have surgery or a procedure in the next 3 days? No               Are you scheduled to receive cancer chemotherapy within the next 7 days?    No     Have you ever been tested before for COVID-19 with a swab of your nose? Yes: When: Sept. 2020, Where: n.a   Are you a healthcare worker being tested so to return to work No         Right now,  do you have any of the following that developed over the past 7 days (as stated by patient on intake form):    Subjective fever (felt feverish) Yes, how many days? 2   Chills (especially repeated shaking chills) Yes, how many days? 2   Severe fatigue (felt very tired) Yes, how many days? 2   Muscle aches Yes, how many days? 2   Runny nose Yes, how many days? 2   Sore throat Yes, how many days? 4   Loss of taste or smell No   Cough (new onset or worsening of chronic cough) Yes, how many days? 2   Shortness of breath Yes, how many days? 2   Nausea or vomiting Yes, how many days? 4   Headache Yes, how many days? 2   Abdominal Pain No   Diarrhea (3 or more loose stools in last 24 hours) No     History/Medical Conditions (as stated by patient on intake form):    Do you have any of the following:   Asthma or emphysema or COPD No   Cystic Fibrosis No   Diabetes No   High Blood Pressure  No   Cardiovascular Disease No   Chronic Kidney Disease No   Chronic Liver Disease Yes   Chronic blood disorder like Sickle Cell Disease  No   Weak immune system due to disease or medication Yes   Neurologic condition that limits movement  No   Developmental delay -  Moderate to Severe  No   Recent (within past 2 weeks) or current Pregnancy No   Morbid Obesity (>100 pounds over ideal weight) No   Current Smoker No   Former Smoker No       Objective     Given above, testing performed: Yes    Testing Performed:  Test Specimen Type Sent to   COVID-19  NP Sw[facility]       Scribe's Attestation:  Cobey Raineri L. Lorin Picket, FNP, obtained and performed the history, physical exam and medical decision making elements that were entered into the chart.  Signed by Teola Bradley serving as Scribe, on 06/17/2019 10:45 AM      The documentation recorded by the scribe accurately reflects the service I personally performed and the decisions made by me. Aida Puffer, FNP  June 17, 2019 2:15 PM

## 2019-06-18 ENCOUNTER — Telehealth: Admit: 2019-06-18 | Discharge: 2019-06-18 | Payer: PRIVATE HEALTH INSURANCE

## 2019-06-18 ENCOUNTER — Ambulatory Visit
Admit: 2019-06-18 | Discharge: 2019-06-18 | Payer: PRIVATE HEALTH INSURANCE | Attending: Critical Care Medicine | Primary: Critical Care Medicine

## 2019-06-18 MED ORDER — ALBUTEROL SULFATE HFA 90 MCG/ACTUATION AEROSOL INHALER
Freq: Four times a day (QID) | RESPIRATORY_TRACT | 0 refills | 0.00000 days | Status: CP | PRN
Start: 2019-06-18 — End: 2020-06-17

## 2019-06-18 MED ORDER — AEROCHAMBER MV SPACER
Freq: Four times a day (QID) | 0 refills | 1.00000 days | Status: CP | PRN
Start: 2019-06-18 — End: ?

## 2019-06-18 NOTE — Unmapped (Signed)
Patient referred for: COVID - [facility]    Outcome: Scheduled visit.    Scheduling Notes:

## 2019-06-18 NOTE — Unmapped (Signed)
Tonya Brady  11/11/1975    Due to your recent positive COVID-19 result, [facility] has the capability to offer Regeneron therapy.  The [place] FDA has issued an Emergency Use Authorization to permit the emergency use of the unapproved product Regeneron for the treatment of mild to moderate coronavirus disease 2019 (COVID19).      This treatment may help to prevent progression on to severe symptoms and/or hospitalization in those who are confirmed to be high-risk COVID (+) and are within 10 days of symptom onset. Could you tell me when your symptoms started 1/25.      Patient is 44 y.o. years old.  Patient current weight:  Wt Readings from Last 1 Encounters:   06/13/19 95.1 kg (209 lb 11.2 oz)        Inclusion Criteria: Patients BMI is There is no height or weight on file to calculate BMI. and is eligible for BAM infusion therapy.     Advised patient that those receiving monoclonal or plasma products should wait 90 days until getting immunized.    Answered all questions utilizing FAQ sheet and patient has accepted treatment therapy.  Inbasket message sent to Needs [facility] and Infusion and encounter routed.  Chart reviewed, patient has has not been assessed and scheduled for [facility] visit on pending.     Current Outpatient Medications   Medication Sig Dispense Refill   ??? belimumab (BENLYSTA) 200 mg/mL AtIn Inject the contents of 1 syringe (200 mg) under the skin every seven (7) days. 4 mL 2   ??? buPROPion (WELLBUTRIN XL) 150 MG 24 hr tablet Take 1 tablet (150 mg total) by mouth every morning. 30 tablet 2   ??? famotidine (PEPCID) 40 MG tablet Take 1 tablet (40 mg total) by mouth nightly as needed for heartburn. 90 tablet 3   ??? hydrOXYchloroQUINE (PLAQUENIL) 200 mg tablet Take 1 tablet (200 mg total) by mouth Two (2) times a day. 60 tablet 1   ??? ibuprofen (MOTRIN) 800 MG tablet ibuprofen 800 mg tablet     ??? naltrexone (DEPADE) 50 mg tablet Take 0.5 tablets (25 mg total) by mouth two (2) times a day. First two weeks---start 0.5 tablet once daily. 60 tablet 2     No current facility-administered medications for this visit.

## 2019-06-18 NOTE — Unmapped (Signed)
TeleHealth Video Encounter  This medical encounter was conducted virtually using [email or web address]  TeleHealth protocols.      I have identified myself to the patient and conveyed my credentials to [name]  In case we get disconnected, patient's phone number is [phone] (home)    Patient has signed informed consent on file in medical record.  Is there someone else in the room? Yes. What is your relationship? husband. Do you want this person here for the visit? yes..      Assessment/Plan:      1. COVID-19      Disposition  Based upon the overall clinical picture, we recommend limited exam and evaluation for SOB and chest pain at one of our [facility] or if any worsening or cannot be seen today pt recommended to go to the ER.  Rx called in for Albuterol inhaler and spacer. Based upon the patient's age and comorbidity of BMI 39.6, patient's positive Covid-19 diagnosis,current clinical evaluation with mild to moderate symptoms, and length of symptoms less than 10 days they are an appropriate candidate for monoclonal antibody IV therapy for Covid-19 (bamlanivimab or casirivimab/imdevimab) Patient also instructed the following: If your symptoms worsen or you have questions or problems call the [facility] at [phone]  If your symptoms become severe and you cannot manage them at home, please call 911 or go directly to the Emergency Department.      Follow up  As above         Subjective:   HPI  Tonya Brady is a 44 y.o. female who presents  for a video visit with on-going symptoms amidst COVID-19 outbreak.  On Monday started feeling bad and was tested on Tuesday and has had HA, fatigue, dizzy, mucous, and  fevers.  Does state she is having chest pain with coughing which she describes as a pain that pulls.  States she went to the [facility] tent yesterday for the Covid test and while she was not examined, they checked her pulse ox and it  was good.    ROS  As per HPI.    I have reviewed the problem list, medications, and allergies and have updated/reconciled them if needed.        Objective:     As part of this Video Visit, no in-person exam was conducted.  Video interaction permitted the following observations.    GEN: No acute distress. Hoarse voice  SKIN: Color is normal. No rashes.  PSYCH: Appropriate affect, normal mood  RESP: Normal work of breathing, no retractions         I spent 10 minutes on the real-time audio and video and phone with the patient on the date of service. I spent an additional  6 minutes on pre- and post-visit activities.     The patient was physically located in [place] or a state in which I am permitted to provide care. The patient and/or parent/guardian understood that s/he may incur co-pays and cost sharing, and agreed to the telemedicine visit. The visit was reasonable and appropriate under the circumstances given the patient's presentation at the time.    The patient and/or parent/guardian has been advised of the potential risks and limitations of this mode of treatment (including, but not limited to, the absence of in-person examination) and has agreed to be treated using telemedicine. The patient's/patient's family's questions regarding telemedicine have been answered.     If the visit was completed in an ambulatory setting, the patient and/or parent/guardian  has also been advised to contact their provider???s office for worsening conditions, and seek emergency medical treatment and/or call 911 if the patient deems either necessary.

## 2019-06-18 NOTE — Unmapped (Signed)
06/18/19  Pieper D Tengan  January 04, 1976    COVID OUTREACH:     Outreach to patient for positive result outreach .   Lab Results   Component Value Date    SARSCOVID Detected (A) 06/17/2019        OUTREACH SYMPTOMS & NEEDS:     Called to check-in: patient currently notes the following symptoms: cough, fatigue , runny nose, loss of smell  and loss of taste.    Overall would you say you are better, same or worse? unchanged    Social situation:   Do you have someone at home who is helping to take care of you? Yes.  Do you have any concerns about not being able to get enough food? No.  Refer to 211 or social work if needed.    DISPOSITION:     Patient: follow up [facility] for further evaluation/exam IF COVID positive .   Work note provided: no, patient declined at this time.  Patient advised the following based on result and current symptoms: because you have a positive COVID test and continue to have symptoms, we recommend you remain at home and continue to self-isolate until you have not had a fever for twenty four (24) hours, your symptoms have improved significantly and at least ten (10) days have passed since symptoms began. Guidance on returning to work should be coordinated with your employer. We also recommend that members of your household remain in the home if they continue to have close contact with you while you are contagious and self-isolating.  Household members will also need to remain in the home for 14 days after their last close contact with you while you are self-isolating.  Household members should also self-monitor for any fever and symptoms of COVID-19 such as cough, shortness of breath, sore throat or loss of smell or taste. If household members develop symptoms, they should contact their primary care provider..  If there are any changes or worsening of symptoms patient advised to contact their PCP. Patient also advised to call 911 for a medical emergency such as severe shortness of breath.Elisha Ponder, FNP

## 2019-06-18 NOTE — Unmapped (Signed)
Patient referred for: COVID [facility]    Outcome: Scheduled visit.    Scheduling Notes: Symptomatic patient (COVID +) -- exam needed.

## 2019-06-18 NOTE — Unmapped (Signed)
I think it is best we have you seen by a medical provider. I have placed a referral to the [facility] Someone will be calling you  to schedule an appointment. In the meantime, if your symptoms worsen or you have questions or problems call the [facility] at [phone]  If your symptoms become severe and you cannot manage them at home, please call 911 or go directly to the Emergency Department.   As discussed you are a candidate for monoclonal antibody IV therapy for Covid-19 (bamlanivimab or casirivimab/imdevimab)  which is on EUA (Emergency Use Authorization). I have placed a referral to the Covid therapy coordinators and someone will be calling you.  In the meantime, please continue to isolate/quarantine. Stay home and do not go to work, school, the store, or any gatherings. If your symptoms worsen or you have questions or problems call the [facility] at [phone]  If your symptoms become severe and you cannot manage them at home, please call 911 or go directly to the Emergency Department.   FACT SHEET FOR PATIENTS, PARENTS AND CAREGIVERS  EMERGENCY USE AUTHORIZATION (EUA) OF CASIRIVIMAB AND IMDEVIMAB  FOR CORONAVIRUS DISEASE 2019  (COVID-19)  You are being given a medicine called casirivimab and imdevimab for the treatment of  coronavirus disease 2019 (COVID-19). This Fact Sheet contains information to help you  understand the potential risks and potential benefits of taking casirivimab and imdevimab, which  you may receive.    Receiving casirivimab and imdevimab may benefit certain people with COVID-19.  Read this Fact Sheet for information about casirivimab and imdevimab. Talk to your healthcare  provider if you have questions. It is your choice to receive casirivimab and imdevimab or stop at  any time.    WHAT IS COVID-19?  COVID-19 is caused by a virus called a coronavirus. People can get COVID-19 through contact  with another person who has the virus.  COVID-19 illnesses have ranged from very mild (including some with no reported symptoms) to  severe, including illness resulting in death. While information so far suggests that most COVID19 illness is mild, serious illness can occur and may cause some of your other medical conditions to become worse. People of all ages with severe, long-lasting (chronic) medical conditions like  heart disease, lung disease, and diabetes, for example, seem to be at higher risk of being  hospitalized for COVID-19.    WHAT ARE THE SYMPTOMS OF COVID-19?  The symptoms of COVID-19 include fever, cough, and shortness of breath, which may appear 2  to 14 days after exposure. Serious illness including breathing problems can occur and may cause  your other medical conditions to become worse.    WHAT IS CASIRIVIMAB AND IMDEVIMAB?  Casirivimab and imdevimab are investigational medicines used to treat mild to moderate  symptoms of COVID-19 in non-hospitalized adults and adolescents (42 years of age and older  who weigh at least 88 pounds (40 kg)), and who are at high risk for developing severe COVID19 symptoms or the need for hospitalization. Casirivimab and imdevimab are investigational  because they are still being studied. There is limited information known about the safety and  effectiveness of using casirivimab and imdevimab to treat people with COVID-19.  The FDA has authorized the emergency use of casirivimab and imdevimab for the treatment of  COVID-19 under an Emergency Use Authorization (EUA). For more information on EUA, see  the ???What is an Emergency Use Authorization (EUA)???? section at the end of this Fact  Sheet.    WHAT SHOULD I TELL MY HEALTH CARE PROVIDER BEFORE I RECEIVE  CASIRIVIMAB AND IMDEVIMAB?  Tell your healthcare provider about all of your medical conditions, including if you:  ??? Have any allergies  ??? Are pregnant or plan to become pregnant  ??? Are breastfeeding or plan to breastfeed  ??? Have any serious illnesses  ??? Are taking any medications (prescription, over-the-counter, vitamins, and herbal  Products)    HOW WILL I RECEIVE CASIRIVIMAB AND IMDEVIMAB?  ??? Casirivimab and imdevimab are two investigational medicines given together as a single  intravenous infusion (through a vein) for at least 1 hour.  ??? You will receive one dose of casirivimab and imdevimab by intravenous infusion.    WHAT ARE THE IMPORTANT POSSIBLE SIDE EFFECTS OF CASIRIVIMAB AND  IMDEVIMAB?  Possible side effects of casirivimab and imdevimab are:  ??? Allergic reactions. Allergic reactions can happen during and after infusion with  casirivimab and imdevimab. Tell your healthcare provider or nurse, or get medical help  right away if you get any of the following signs and symptoms of allergic reactions:  fever, chills, low blood pressure, changes in your heartbeat, shortness of breath,  wheezing, swelling of your lips, face, or throat, rash including hives, itching, headache,  nausea, vomiting, sweating, muscle aches, dizziness and shivering.  The side effects of getting any medicine by vein may include brief pain, bleeding, bruising of the  skin, soreness, swelling, and possible infection at the infusion site.  These are not all the possible side effects of casirivimab and imdevimab. Not a lot of people have  been given casirivimab and imdevimab. Serious and unexpected side effects may happen.  Casirivimab and imdevimab are still being studied so it is possible that all of the risks are not  known at this time.  It is possible that casirivimab and imdevimab could interfere with your body's own ability to  fight off a future infection of SARS-CoV-2. Similarly, casirivimab and imdevimab may reduce  your body???s immune response to a vaccine for SARS-CoV-2. Specific studies have not been  conducted to address these possible risks. Talk to your healthcare provider if you have any  questions.    WHAT OTHER TREATMENT CHOICES ARE THERE?  Like casirivimab and imdevimab, FDA may allow for the emergency use of other medicines to  treat people with COVID-19. Go to UKRank.es for  information on other medicines used to treat people with COVID-19.  It is your choice to be treated or not to be treated with casirivimab and imdevimab. Should you  decide not to receive casirivimab and imdevimab or stop it at any time, it will not change your  standard medical care.    WHAT IF I AM PREGNANT OR BREASTFEEDING?  There is limited experience treating pregnant women or breastfeeding mothers with casirivimab  and imdevimab. For a mother and unborn baby, the benefit of receiving casirivimab and  imdevimab may be greater than the risk from the treatment. If you are pregnant or breastfeeding,  discuss your options and specific situation with your healthcare provider.    HOW DO I REPORT SIDE EFFECTS WITH CASIRIVIMAB AND IMDEVIMAB?  Tell your healthcare provider right away if you have any side effect that bothers you or does not  go away.  Report side effects to FDA MedWatch at MacRetreat.be or call 1-800-FDA-1088 or  call 616-307-0991.  HOW CAN I LEARN MORE?  ??? Ask your health care provider.  ??? Visit www.ContactTape.nl  ??? Visit UKRank.es  ???  Contact your local or state public health department.  WHAT IS AN EMERGENCY USE AUTHORIZATION (EUA)?  The [place] FDA has made casirivimab and imdevimab available under an emergency  access mechanism called an EUA. The EUA is supported by a Surveyor, minerals and Human  Service (HHS) declaration that circumstances exist to justify the emergency use of drugs and  biological products during the COVID-19 pandemic.  Casirivimab and imdevimab have not undergone the same type of review as an FDA-approved or  cleared product. The FDA may issue an EUA when certain criteria are met, which includes that  there are no adequate, approved, available alternatives. In addition, the FDA decision is based on the totality of scientific evidence available showing that it is reasonable to believe that the  product meets certain criteria for safety, performance, and labeling and may be effective in  treatment of patients during the COVID-19 pandemic. All of these criteria must be met to allow  for the product to be used in the treatment of patients during the COVID-19 pandemic.  The EUA for casirivimab and imdevimab is in effect for the duration of the COVID-19  declaration justifying emergency use of these products, unless terminated or revoked (after  which the products may no longer be used).  Manufactured by:  [employer]  [address]  [place], [place] [address]  ??2020 [employer]. All rights reserved.  Authorized: 03/2019

## 2019-06-19 ENCOUNTER — Ambulatory Visit: Admit: 2019-06-19 | Discharge: 2019-06-20 | Payer: PRIVATE HEALTH INSURANCE

## 2019-06-19 NOTE — Unmapped (Signed)
1325 Pt arrived for Regeneron for COVID.     1357 Regeneron 1200 mg IV infusing over 1 hr via PIV.    1417 Regeneron paused; patient reported nausea and back pain. 25mg  Benadryl and 20mg  Pepcid given with no alleviation of symptoms. Solumedrol 125mg  given and back pain resolved. Patient now reporting rigors and BP elevated to 213/96. Patient still reports nausea; 8mg  Zofran given with slight alleviation. Family med MD Denyse Amass called and advised to not re-challenge. Patient monitored until 1630 at which time she stated alleviation of symptoms. BP returned to baseline.

## 2019-06-19 NOTE — Unmapped (Signed)
Post Infusion Contacts    After your Regeneron therapy, you can contact your primary care provider or our Clinical Contact Center [phone] for minor symptoms.   For severe symptoms, please dial 911.     A [facility] nurse will be calling you to check in tomorrow as well.     Those receiving monoclonal or plasma products should wait 90 days until getting immunized.     Thank you for choosing [facility]. Please call for any questions.  [facility] (scheduling) [phone] or the Nurses' Station [phone]        FACT SHEET FOR PATIENTS, PARENTS AND CAREGIVERS     EMERGENCY USE AUTHORIZATION (EUA) OF CASIRIVIMAB AND IMDEVIMAB FOR CORONAVIRUS DISEASE 2019   (COVID-19)     You are being given a medicine called casirivimab and imdevimab for the treatment of coronavirus disease 2019 (COVID-19). This Fact Sheet contains information to help you understand the potential risks and potential benefits of taking casirivimab and imdevimab, which you may receive.   Receiving casirivimab and imdevimab may benefit certain people with COVID-19.   Read this Fact Sheet for information about casirivimab and imdevimab. Talk to your healthcare provider if you have questions. It is your choice to receive casirivimab and imdevimab or stop at any time.       WHAT IS COVID-19?     COVID-19 is caused by a virus called a coronavirus. People can get COVID-19 through contact with another person who has the virus.   COVID-19 illnesses have ranged from very mild (including some with no reported symptoms) to severe, including illness resulting in death. While information so far suggests that most COVID-19 illness is mild, serious illness can occur and may cause some of your other medical conditions to become worse. People of all ages with severe, long-lasting (chronic) medical conditions like heart disease, lung disease, and diabetes, for example, seem to be at higher risk of being hospitalized for COVID-19.     WHAT ARE THE SYMPTOMS OF COVID-19?   The symptoms of COVID-19 include fever, cough, and shortness of breath, which may appear 2 to 14 days after exposure. Serious illness including breathing problems can occur and may cause your other medical conditions to become worse.     WHAT IS CASIRIVIMAB AND IMDEVIMAB?   Casirivimab and imdevimab are investigational medicines used to treat mild to moderate symptoms of COVID-19 in non-hospitalized adults and adolescents (20 years of age and older who weigh at least 88 pounds (40 kg)), and who are at high risk for developing severe COVID-19 symptoms or the need for hospitalization. Casirivimab and imdevimab are investigational because they are still being studied. There is limited information known about the safety and effectiveness of using casirivimab and imdevimab to treat people with COVID-19.   The FDA has authorized the emergency use of casirivimab and imdevimab for the treatment of COVID-19 under an Emergency Use Authorization (EUA). For more information on EUA, see the What is an Emergency Use Authorization (EUA)? section at the end of this Fact Sheet.  Page 2 of 3     WHAT SHOULD I TELL MY HEALTH CARE PROVIDER BEFORE I RECEIVE CASIRIVIMAB AND IMDEVIMAB?   Tell your healthcare provider about all of your medical conditions, including if you:   ??? Have any allergies   ??? Are pregnant or plan to become pregnant   ??? Are breastfeeding or plan to breastfeed   ??? Have any serious illnesses   ??? Are taking any medications (prescription, over-the-counter,  vitamins, and herbal products)     HOW WILL I RECEIVE CASIRIVIMAB AND IMDEVIMAB?   ??? Casirivimab and imdevimab are two investigational medicines given together as a single intravenous infusion (through a vein) for at least 1 hour.   ??? You will receive one dose of casirivimab and imdevimab by intravenous infusion.     WHAT ARE THE IMPORTANT POSSIBLE SIDE EFFECTS OF CASIRIVIMAB AND IMDEVIMAB?   Possible side effects of casirivimab and imdevimab are:   ??? Allergic reactions. Allergic reactions can happen during and after infusion with casirivimab and imdevimab. Tell your healthcare provider or nurse, or get medical help right away if you get any of the following signs and symptoms of allergic reactions: fever, chills, low blood pressure, changes in your heartbeat, shortness of breath, wheezing, swelling of your lips, face, or throat, rash including hives, itching, headache, nausea, vomiting, sweating, muscle aches, dizziness and shivering.     The side effects of getting any medicine by vein may include brief pain, bleeding, bruising of the skin, soreness, swelling, and possible infection at the infusion site.   These are not all the possible side effects of casirivimab and imdevimab. Not a lot of people have been given casirivimab and imdevimab. Serious and unexpected side effects may happen. Casirivimab and imdevimab are still being studied so it is possible that all of the risks are not known at this time.   It is possible that casirivimab and imdevimab could interfere with your body's own ability to fight off a future infection of SARS-CoV-2. Similarly, casirivimab and imdevimab may reduce your body's immune response to a vaccine for SARS-CoV-2. Specific studies have not been conducted to address these possible risks. Talk to your healthcare provider if you have any questions.     WHAT OTHER TREATMENT CHOICES ARE THERE?     Like casirivimab and imdevimab, FDA may allow for the emergency use of other medicines to treat people with COVID-19. Go to https://www.covid19treatmentguidelines.nih.gov/ for information on other medicines used to treat people with COVID-19.   It is your choice to be treated or not to be treated with casirivimab and imdevimab. Should you decide not to receive casirivimab and imdevimab or stop it at any time, it will not change your standard medical care.  Page 3 of 3     WHAT IF I AM PREGNANT OR BREASTFEEDING?     There is limited experience treating pregnant women or breastfeeding mothers with casirivimab and imdevimab. For a mother and unborn baby, the benefit of receiving casirivimab and imdevimab may be greater than the risk from the treatment. If you are pregnant or breastfeeding, discuss your options and specific situation with your healthcare provider.     HOW DO I REPORT SIDE EFFECTSWITH CASIRIVIMAB AND IMDEVIMAB?     Tell your healthcare provider right away if you have any side effect that bothers you or does not go away.   Report side effects to FDA MedWatch at www.fda.gov/medwatch or call 1-800-FDA-1088 or call 1-844-734-6643.     HOW CAN I LEARN MORE?     ??? Ask your health care provider.   ??? Visit www.REGENCOV2.com   ??? Visit https://www.covid19treatmentguidelines.nih.gov/   ??? Contact your local or state public health department.     WHAT IS AN EMERGENCY USE AUTHORIZATION (EUA)?     The [place] FDA has made casirivimab and imdevimab available under an emergency access mechanism called an EUA. The EUA is supported by a Secretary of Health and Human Service (HHS) declaration   that circumstances exist to justify the emergency use of drugs and biological products during the COVID-19 pandemic.   Casirivimab and imdevimab have not undergone the same type of review as an FDA-approved or cleared product. The FDA may issue an EUA when certain criteria are met, which includes that there are no adequate, approved, available alternatives. In addition, the FDA decision is based on the totality of scientific evidence available showing that it is reasonable to believe that the product meets certain criteria for safety, performance, and labeling and may be effective in treatment of patients during the COVID-19 pandemic. All of these criteria must be met to allow for the product to be used in the treatment of patients during the COVID-19 pandemic.   The EUA for casirivimab and imdevimab is in effect for the duration of the COVID-19 declaration justifying emergency use of these products, unless terminated or revoked (after which the products may no longer be used).       YOUR HEALTH  When You Can be Around Others    Updated Dec. 1, 2020  Facebook Twitter LinkedIn Syndicate  If you have or think you might have COVID-19, it is important to stay home and away from other people. Staying away from others helps stop the spread of COVID-19. If you have an emergency warning sign (including trouble breathing), get emergency medical care immediately.    I think or know I had COVID-19, and I had symptoms  You can be around others after:    10 days since symptoms first appeared and  24 hours with no fever without the use of fever-reducing medications and  Other symptoms of COVID-19 are improving*  *Loss of taste and smell may persist for weeks or months after recovery and need not delay the end of isolation    Most people do not require testing to decide when they can be around others; however, if your healthcare provider recommends testing, they will let you know when you can resume being around others based on your test results.    Note that these recommendations do not apply to persons with severe COVID-19 or with severely weakened immune systems (immunocompromised). These persons should follow the guidance below for ???I was severely ill with COVID-19 or have a severely weakened immune system (immunocompromised) due to a health condition or medication. When can I be around others????    I tested positive for COVID-19 but had no symptoms  If you continue to have no symptoms, you can be with others after 10 days have passed since you had a positive viral test for COVID-19. Most people do not require testing to decide when they can be around others; however, if your healthcare provider recommends testing, they will let you know when you can resume being around others based on your test results.    If you develop symptoms after testing positive, follow the guidance above for ???I think or know I had COVID-19, and I had symptoms.???    I was severely ill with COVID-19 or have a severely weakened immune system (immunocompromised) due to a health condition or medication.     When can I be around others?  People who are severely ill with COVID-19 might need to stay home longer than 10 days and up to 20 days after symptoms first appeared. Persons who are severely immunocompromised may require testing to determine when they can be around others. Talk to your healthcare provider for more information. If testing is available in your community,   it may be recommended by your healthcare provider. Your healthcare provider will let you know if you can resume being around other people based on the results of your testing.    Your doctor may work with an infectious disease expert or your local health department to determine whether testing will be necessary before you can be around others.      ]Anyone who has had close contact with someone with COVID-19 should stay home for 14 days after their last exposure to that person.    The best way to protect yourself and others is to stay home for 14 days if you think you???ve been exposed to someone who has COVID-19. Check your local health department???s website for information about options in your area to possibly shorten this quarantine period.  However, anyone who has had close contact with someone with COVID-19 and who meets the following criteria does NOT need to stay home.    Has COVID-19 illness within the previous 3 months and  Has recovered and  Remains without COVID-19 symptoms (for example, cough, shortness of breath)  Confirmed and suspected cases of reinfection of the virus that causes COVID-19  Cases of reinfection of COVID-19 have been reported but are rare. In general, reinfection means a person was infected (got sick) once, recovered, and then later became infected again. Based on what we know from similar viruses, some reinfections are expected.

## 2019-06-19 NOTE — Unmapped (Addendum)
Thanks for coming today when you weren't feeling well at all. Here's a review of things we talked about:     BREATHING: use the albuterol inhaler as directed up to 4 times daily for shortness of breath, wheezing, or tightness when you take a deep breath. Cool mist humidifier or window open or brief intervals outside can make breathing more comfortable. Try to do some breathing exercises a few times a day     SINUSES/NOSE: nasal irrigation with NeilMed two times daily if you are congested of have nasal drainage. Flonase is also ok to use. There is nothing we can do to bring sense of smell back to normal faster.    COUGH/SORE THROAT/LOST VOICE: gargle with warm salt water at least twice a day, drink some hot tea with honey, suck on cough drops or lozenges (some of them have added vitamin C).  For a dry cough, dextromethorphan is ok to start with - if you're spitting up sputum with a cough, add guaifenesin to that (make sure to increase fluids with guaifenesin)     NAUSEA: small, bland snacks, soups with clear broth frequently in place of big meals; ginger ale or stomach soothing tea. Pepto bismol can help. Imodium is only for diarrhea. More fluids! There is nothing we can do to bring taste back to normal faster.    HEADACHES: maximum use of ibuprofen is 600mg  every 4hrs. It's ok to alternate with acetaminophen but no more Tylenol than 1500mg  in a day to protect your liver. Adequate hydration is important to prevent headaches. REST! Try to minimize stimulation or known triggers of your headaches.    BUILDING YOUR IMMUNE SYSTEM BACK UP: Airborne or Emergen-C are ok to take as directed. Vitamins C and D are important and so is zinc, but be careful - it can upset your stomach.    I want you to be assured that your exam and vital signs today are encouraging. When you feel something, try to do something to relieve it in order to reduce the duration of your viral symptoms. Below is a nice link on tips to work your way back to normal slowly and safely. Remember, most people with COVID do well and recover! We're here to help you be one of them.    [email or web address]

## 2019-06-19 NOTE — Unmapped (Signed)
Assessment/ Plan     Tonya Brady is a 44 y.o. female presenting to [facility] for assessment in the setting of active COVID infection. She is on day 3 of symptoms and day 2 since positive result. Her physical exam is not acute and her vital signs are within appropriate ranges. She was not retested today and counseled extensively on symptomatic management. She is scheduled to receive BAM infusion tomorrow.    Any labs and radiology results that were available during my care of the patient were independently reviewed by me and considered in my medical decision making.    Encounter diagnosis:   Diagnosis ICD-10-CM   1. COVID-19  U07.1     IMPRESSION/PLAN:  1. No COVID test indicated. Physical exam suggestive of viral syndrome with VSS. She was counseled on symptomatic management and is already scheduled for monoclonal antibody infusion. She may return to [place] as needed via primary team if symptoms progress despite her aggressive ameliorative measures.    Pt counseled on routine care for symptoms, see AVS for details.  COVID testing performed no.  Patient directed to Home given findings during today's visit.    Subjective     HPI:   Tonya Brady is a 44 y.o. female with PMHx notable for SLE, NASH, GERD, obesity, fibromyalgia, secondary Sjogren's syn, and allergic rhinitis who [facility] with symptoms of viral syndrome x 2 days since COVID positive result. Her primary complaints today are headache and profound fatigue. She also reports anosmia, sore throat, sinus tenderness and congestion, cough, SOB, rhinorrhea, myalgias, subjective fever and chills. She tells me she is scheduled for BAM infusion tomorrow and was encouraged to attend. We discussed symptomatic interventions for each of her symptoms and this is summarized on MyChart. Given PE evidence that she is moving air well to bilateral bases and oxygenating appropriately, CXR is not indicated at this time. She has been prescribed albuterol inhaler and was encouraged to use this as needed for SOB or tightness in her chest. I did not feel systemic corticosteroids were indicated today but would reconsider with progressive respiratory symptoms. She was given NeilMed sample bottle for sinus irrigation. We reviewed OTC analgesia/fever modification dosing with suggestion that she try to incorporate frequent small snacks to help medication absorption along with marked increase in PO fluids. We discussed use of vitamin C & D. She was encouraged to rest as needed - there is no expedited recovery for exerting more/faster. She was encouraged to reach out via MyChart with questions or concerns or contact primary team who can direct her back to [place] if needed. We reviewed s/sx which merit emergent evaluation: SOB rendering her unable to converse, persistent dizziness/near-syncope, prolonged significant chest pain intractable to rest.       Exposure History: In the last 14 days?     Have you traveled outside of [place]? No                City/State Country (if outside [place])                  Have you been in close contact with someone confirmed by a test to have COVID? (Close contact is within 6 feet for at least 10 minutes) Yes, Who? self and husband         Worked in a health care facility?   No       Do you live in a facility like a nursing  home, group home, or assisted living? No   Are you scheduled to receive cancer chemotherapy within the next 7 days? No   Have you ever been tested before for COVID-19 with a swab of your nose? Yes, When: 06/17/2019   Where: [facility]   Are you a healthcare worker being tested to return to work? No     Symptoms:   Do you currently have any of the following that developed in the last 7 days:    Subjective fever (felt feverish) Yes, how many days? 2   Chills (especially repeated shaking chills) Yes, how many days? 2   Sever fatigue (felt very tired) Yes, how many days? 2   Muscle aches Yes, how many days? 2   Runny nose Yes, how many days? 2   Sore Throat Yes, how many days? 2   Loss of taste or smell Yes, how many days? 2   Cough (new onset or worsening of chronic cough) Yes, how many days? 2   Shortness of breath Yes, how many days? 2   Nausea or vomiting No   Headache No   Abdominal Pain Yes, how many days? 2   Diarrhea (3 or more loose stools in last 24 hours) No     Social Hx:  Who is in your household?: patient, husband, children  Household members with high risk conditions?: none  Household members with COVID contact?: yes  Current smoker or second hand smoke exposure?: none  Occupational exposure: unknown    Past History/Medical Conditions:  Allergies   Allergen Reactions   ??? Aspirin Swelling     Other reaction(s): SWELLING/EDEMA   ??? Fish Containing Products Diarrhea and Nausea And Vomiting   ??? Iodine Other (See Comments)     Pt told not to have d/t shellfish allergy   ??? Shellfish Containing Products Hives, Diarrhea and Nausea Only     Other reaction(s): HIVES  Other reaction(s): HIVES  Other reaction(s): NAUSEA     Active Ambulatory Problems     Diagnosis Date Noted   ??? Systemic lupus erythematosus (CMS-HCC) 01/19/2011   ??? Allergic rhinitis 01/20/2011   ??? Secondary Sjogren's syndrome (CMS-HCC) 03/09/2013   ??? Fibromyalgia 03/09/2013   ??? Restless leg syndrome 06/30/2013   ??? Thrombocytopenia (CMS-HCC) 11/10/2015   ??? Palpitations 11/11/2015   ??? Morbid obesity due to excess calories (CMS-HCC) 02/10/2016   ??? Chronic abdominal pain 02/10/2016   ??? 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 Moderate episode of recurrent major depressive disorder (CMS-HCC) 07/10/2016   ??? Gastroesophageal reflux disease 08/09/2016   ??? GAD (generalized anxiety disorder) 08/09/2016   ??? Acute left ankle pain 05/29/2017   ??? Acute carpal tunnel syndrome of left wrist 11/08/2017   ??? Annual physical exam 11/28/2017   ??? Acquired trigger finger 09/19/2018   ??? Swelling of right parotid gland 11/11/2018   ??? Swelling of left parotid gland 12/23/2018   ??? Acute cystitis without hematuria 05/12/2019   ??? Class 2 obesity 06/13/2019   ??? Insomnia 06/13/2019   ??? COVID-19 06/18/2019     Resolved Ambulatory Problems     Diagnosis Date Noted   ??? Needs flu shot 03/10/2013   ??? Transaminitis 11/15/2015   ??? Bronchitis 04/09/2016   ??? Acute pain of right shoulder 07/10/2016   ??? Prediabetes 08/09/2016   ??? Viral URI with cough 02/19/2017   ??? Need for prophylactic vaccination against hepatitis A and hepatitis B in adult 05/29/2017  Past Medical History:   Diagnosis Date   ??? Bulging lumbar disc 2005   ??? Cirrhosis of liver (CMS-HCC)    ??? Difficult intravenous access    ??? NASH (nonalcoholic steatohepatitis)    ??? Valvular regurgitation      Do you have any of the following:   Asthma or emphysema or COPD No   Cystic Fibrosis No   Diabetes No   High Blood Pressure  No   Cardiovascular Disease No   Chronic Kidney Disease No   Chronic Liver Disease Yes   Chronic blood disorder like Sickle Cell Disease  No   Weak immune system due to disease or medication Yes, Which? lupus   Neurologic condition that limits movement  No   Developmental delay - Moderate to Severe  No   Recent (within past 2 weeks) or current Pregnancy No   Morbid Obesity (>100 pounds over ideal weight) No   Current Smoker No   Former Smoker No     Review of Systems   Constitutional: Positive for chills, fever and malaise/fatigue.   HENT: Positive for congestion, sinus pain and sore throat.    Respiratory: Positive for cough and shortness of breath.    Gastrointestinal: Positive for diarrhea. Negative for nausea.   Genitourinary: Negative.    Musculoskeletal: Positive for joint pain and myalgias.   Neurological: Positive for headaches.     Objective     Vitals:    06/18/19 1612   BP: 112/55   BP Site: R Arm   BP Position: Sitting   BP Cuff Size: Large   Pulse: 73   Resp: 24   Temp: 36.8 ??C (98.2 ??F)   TempSrc: Oral   SpO2: 98%       Physical Exam Constitutional: She is oriented to person, place, and time. She appears well-developed and well-nourished. She appears ill. No distress.   HENT:   Head: Normocephalic.   Right Ear: Tympanic membrane, external ear and ear canal normal.   Left Ear: Tympanic membrane, external ear and ear canal normal.   Nose: Mucosal edema and rhinorrhea present. Right sinus exhibits no maxillary sinus tenderness and no frontal sinus tenderness. Left sinus exhibits no maxillary sinus tenderness and no frontal sinus tenderness.   Mouth/Throat: Posterior oropharyngeal erythema present.   + laryngitis   Neck: Neck supple.   Cardiovascular: Normal rate, regular rhythm, S1 normal, S2 normal and intact distal pulses.   No murmur heard.  Pulmonary/Chest: No accessory muscle usage. Tachypnea noted. No respiratory distress. She has decreased breath sounds in the right lower field and the left lower field. She has no wheezes. She has no rhonchi.   Lymphadenopathy:        Head (right side): No submandibular, no tonsillar, no preauricular and no posterior auricular adenopathy present.        Head (left side): No submandibular, no tonsillar, no preauricular and no posterior auricular adenopathy present.     She has no cervical adenopathy.   Neurological: She is alert and oriented to person, place, and time.   Skin: Skin is warm and dry. She is not diaphoretic.     Scribe's Attestation: [employer] obtained and performed the history, physical exam and medical decision making elements that were entered into the chart.  Signed by Eddie Dibbles, CMA serving as Scribe, on 06/18/2019 4:12 PM    The documentation recorded by the scribe accurately reflects the service I personally performed and the decisions made by me. Zimri Brennen  June 18, 2019 4:12 PM

## 2019-06-20 NOTE — Unmapped (Signed)
BAM Post Infusion Check-In (Day 1)    Caller states the BAM infusion stopped after twenty minutes as she had rigors. She declined any triage today and provided contact # if needing any further assistance.

## 2019-06-20 NOTE — Unmapped (Signed)
06/20/19  Tonya Brady  [birth year]    COVID OUTREACH:     Outreach to patient for positive result outreach .   Lab Results   Component Value Date    SARSCOVID Detected (A) 06/17/2019        OUTREACH SYMPTOMS & NEEDS:     Called to check-in: patient currently notes the following symptoms: no new symptoms. , fever, cough, fatigue , sore throat and diarrhea. Had reaction to Regeneron.     Overall would you say you are better, same or worse? better    Social situation:   Do you have someone at home who is helping to take care of you? Yes.  Do you have any concerns about not being able to get enough food? No.  Refer to 211 or social work if needed.    DISPOSITION:     Patient: advised next check-in will occur in 2 business days.   Work note provided: no, patient declined at this time.  Patient advised the following based on result and current symptoms: because you have a positive COVID test and continue to have symptoms, we recommend you remain at home and continue to self-isolate until you have not had a fever for twenty four (24) hours, your symptoms have improved significantly and at least ten (10) days have passed since symptoms began. Guidance on returning to work should be coordinated with your employer. We also recommend that members of your household remain in the home if they continue to have close contact with you while you are contagious and self-isolating.  Household members will also need to remain in the home for 14 days after their last close contact with you while you are self-isolating.  Household members should also self-monitor for any fever and symptoms of COVID-19 such as cough, shortness of breath, sore throat or loss of smell or taste. If household members develop symptoms, they should contact their primary care provider..  If there are any changes or worsening of symptoms patient advised to contact their PCP. Patient also advised to call 911 for a medical emergency such as severe shortness of breath.Elisha Ponder, FNP

## 2019-06-24 NOTE — Unmapped (Signed)
06/23/19  Shenee D Souders  May 12, 1976    COVID OUTREACH:     Outreach to patient for positive result outreach .   Lab Results   Component Value Date    SARSCOVID Detected (A) 06/17/2019        OUTREACH SYMPTOMS & NEEDS:     Called to check-in: patient currently notes the following symptoms: no new symptoms.  and has shown improvement over 2 days.. Fatigue, headache.  No fever or SOB     Overall would you say you are better, same or worse? better    Social situation:   Do you have someone at home who is helping to take care of you? Yes.  Do you have any concerns about not being able to get enough food? No.  Refer to 211 or social work if needed.    DISPOSITION:     Patient: advised next check-in will occur in 2 business days.   Work note provided: no, patient declined at this time.  Patient advised the following based on result and current symptoms: because you have a positive COVID test and continue to have symptoms, we recommend you remain at home and continue to self-isolate until you have not had a fever for twenty four (24) hours, your symptoms have improved significantly and at least ten (10) days have passed since symptoms began. Guidance on returning to work should be coordinated with your employer. We also recommend that members of your household remain in the home if they continue to have close contact with you while you are contagious and self-isolating.  Household members will also need to remain in the home for 14 days after their last close contact with you while you are self-isolating.  Household members should also self-monitor for any fever and symptoms of COVID-19 such as cough, shortness of breath, sore throat or loss of smell or taste. If household members develop symptoms, they should contact their primary care provider..  If there are any changes or worsening of symptoms patient advised to contact their PCP. Patient also advised to call 911 for a medical emergency such as severe shortness of breath.Elisha Ponder, FNP

## 2019-06-26 MED FILL — BENLYSTA 200 MG/ML SUBCUTANEOUS AUTO-INJECTOR: 28 days supply | Qty: 4 | Fill #1 | Status: AC

## 2019-06-26 MED FILL — BENLYSTA 200 MG/ML SUBCUTANEOUS AUTO-INJECTOR: SUBCUTANEOUS | 28 days supply | Qty: 4 | Fill #1

## 2019-06-29 NOTE — Unmapped (Signed)
06/29/19  Tonya Brady  02/03/1976    COVID OUTREACH:     Outreach to patient for positive result outreach .   Lab Results   Component Value Date    SARSCOVID Detected (A) 06/17/2019        OUTREACH SYMPTOMS & NEEDS:     Called to check-in: patient currently notes the following symptoms: no new symptoms.  and has shown improvement over 3 days..    Overall would you say you are better, same or worse? better    Social situation:   Do you have someone at home who is helping to take care of you? Yes.  Do you have any concerns about not being able to get enough food? No.  Refer to 211 or social work if needed.    DISPOSITION:     Patient: advised no further check-in needed and to call back with any questions, concerns, or worsening.   Work note provided: no, patient declined at this time.  Patient advised the following based on result and current symptoms: because you have a positive COVID test but are asymptomatic, please continue to monitor for symptoms.  Common symptoms of COVID-19 include fever or chills, cough or shortness of breath, loss of sense of smell or taste, diarrhea or nausea and headaches. We recommend, even if you are not showing any symptoms, you self-isolate and remain in your home.  If you do not develop any symptoms, you can discontinue isolation after at least ten (10) days have passed since you tested positive for COVID-19. Please notify us if you develop symptoms.  We also recommend that members of your household remain in the home if they continue to have close contact with you while you are contagious and self-isolating.  Household members will also need to remain in the home for 14 days after their last close contact with you while you are self-isolating.  Household members should also self-monitor for any fever and symptoms of COVID-19 such as cough, shortness of breath, sore throat or loss of smell or taste. If household members develop symptoms, they should contact their primary care provider..  If there are any changes or worsening of symptoms patient advised to contact their PCP. Patient also advised to call 911 for a medical emergency such as severe shortness of breath.Elisha Ponder, FNP

## 2019-07-07 NOTE — Unmapped (Signed)
Nutrition Consult Note    Referring Provider:  Alvy Beal*    Reason for Referral:  Nutrition Counseling        Medical History:  Patient Active Problem List   Diagnosis   ??? Systemic lupus erythematosus (CMS-HCC)   ??? Allergic rhinitis   ??? Secondary Sjogren's syndrome (CMS-HCC)   ??? Fibromyalgia   ??? Restless leg syndrome   ??? Thrombocytopenia (CMS-HCC)   ??? Palpitations   ??? Morbid obesity due to excess calories (CMS-HCC)   ??? Chronic abdominal pain   ??? 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 Moderate episode of recurrent major depressive disorder (CMS-HCC)   ??? Gastroesophageal reflux disease   ??? GAD (generalized anxiety disorder)   ??? Acute left ankle pain   ??? Acute carpal tunnel syndrome of left wrist   ??? Annual physical exam   ??? Acquired trigger finger   ??? Swelling of right parotid gland   ??? Swelling of left parotid gland   ??? Acute cystitis without hematuria   ??? Class 2 obesity   ??? Insomnia   ??? COVID-19       Barriers to Care:  acuity of illness    - Weight from last MD appointment   Present height 154.9 cm (5' 0.98)  Present weight 94.9 kg (209 lb 1.8 oz) 204 lbs   Current BMI (!) 39.53    Weight Status Categories:  Underweight:  BMI < 18.5  Normal Weight:  BMI 18.5 - 24.9  Overweight:  BMI 25 - 29.9  Obesity Class I:  BMI 30 - 34.9  Obesity Class II:  BMI 35 - 39.9  Obesity Class III:  BMI ? 40    Weight History:  Wt Readings from Last 6 Encounters:   07/10/19 94.9 kg (209 lb 1.8 oz)   06/13/19 95.1 kg (209 lb 11.2 oz)   05/12/19 95.3 kg (210 lb)   05/12/19 90.7 kg (200 lb)   04/28/19 90.7 kg (200 lb)   03/07/19 90.7 kg (200 lb)       Allergies:   Allergies   Allergen Reactions   ??? Aspirin Swelling     Other reaction(s): SWELLING/EDEMA   ??? Fish Containing Products Diarrhea and Nausea And Vomiting   ??? Iodine Other (See Comments)     Pt told not to have d/t shellfish allergy   ??? Shellfish Containing Products Hives, Diarrhea and Nausea Only     Other reaction(s): HIVES Other reaction(s): HIVES  Other reaction(s): NAUSEA       Relevant Medications, Herbs, Supplements:    Current Outpatient Medications:   ???  albuterol HFA 90 mcg/actuation inhaler, Inhale 2 puffs every six (6) hours as needed for wheezing., Disp: 1 Inhaler, Rfl: 0  ???  belimumab (BENLYSTA) 200 mg/mL AtIn, Inject the contents of 1 syringe (200 mg) under the skin every seven (7) days., Disp: 4 mL, Rfl: 2  ???  buPROPion (WELLBUTRIN XL) 150 MG 24 hr tablet, Take 1 tablet (150 mg total) by mouth every morning., Disp: 30 tablet, Rfl: 2  ???  famotidine (PEPCID) 40 MG tablet, Take 1 tablet (40 mg total) by mouth nightly as needed for heartburn., Disp: 90 tablet, Rfl: 3  ???  hydrOXYchloroQUINE (PLAQUENIL) 200 mg tablet, Take 1 tablet (200 mg total) by mouth Two (2) times a day., Disp: 60 tablet, Rfl: 1  ???  ibuprofen (MOTRIN) 800 MG tablet, ibuprofen 800 mg tablet, Disp: , Rfl:   ???  inhalational spacing  device (AEROCHAMBER MV) Spcr, 1 each by Miscellaneous route every six (6) hours as needed., Disp: 1 each, Rfl: 0  ???  naltrexone (DEPADE) 50 mg tablet, Take 0.5 tablets (25 mg total) by mouth two (2) times a day. First two weeks---start 0.5 tablet once daily., Disp: 60 tablet, Rfl: 2    Do you have any concerns about taking or affording your medications?  None    Relevant Labs:  Hemoglobin A1C (%)   Date Value   02/13/2019 5.4   11/28/2017 5.2   10/10/2016 5.6   02/10/2016 5.9   06/23/2011 5.5      No components found for: LDLCALC   BP Readings from Last 3 Encounters:   06/19/19 134/65   06/18/19 112/55   05/12/19 123/81     Lab Results   Component Value Date    CHOL 148 02/13/2019    CHOL 160 11/28/2017    CHOL 165 06/23/2011     Lab Results   Component Value Date    HDL 37 (L) 02/13/2019    HDL 44 11/28/2017    HDL 35 (L) 06/23/2011     Lab Results   Component Value Date    LDL 92 02/13/2019    LDL 103 (H) 11/28/2017    LDL 104 06/23/2011     Lab Results   Component Value Date    VLDL 18.6 02/13/2019    VLDL 13.2 11/28/2017 Lab Results   Component Value Date    CHOLHDLRATIO 4.0 02/13/2019    CHOLHDLRATIO 3.6 11/28/2017     Lab Results   Component Value Date    TRIG 93 02/13/2019    TRIG 66 11/28/2017    TRIG 132 06/23/2011       No results found for: VITDTOTAL  Lab Results   Component Value Date    VITAMINB12 >1000 (H) 06/23/2011       Physical Activity:  Loves to walk   -- struggling to walk recently with legs   Reports that energy is still low       24-Hour recall/usual intake:    Time Intake   Breakfast Cereal   Yogurt   Banana and Grapes     Yogurt - Apple or banana     Slim Fast Shake        Snack (AM)    Lunch 5 chicken nuggets and 1/2 fries   -- salad and sandwich   -- bowl of soup   -- Raman     Pizza rolls     Grilled chicken salad     Atkins bar      Snack (PM)    Dinner [employer]     [employer] - burger 1/2 fries     Grilled Chicken and Green Beans     1 cup of wheat noodles spaghetti sauce and salad      Snack (HS)      Other Nutrition Information:  HISTORY Cirosis of the liver - 3 years ago   Had to cancel because of COVID   Still trying to get her energy back   Reports that taste is still limited     USUAL INTAKES two-three meals   Has been doing to shakes   Hardly ate for the weeks she has COVID     SNACKING Does have some snacks around - pack of cookies or bag of chips   Limited with COVD     GROCERY SHOPPING 1-2 times a week   -8-9 people in  the house    COOKING Doreatha Massed has been out this week   -- trying to cut down on bread pasta     DINING OUT 1x per week   Brett Albino when they were out of power     BEVERAGES Water - has been putting lemon in the water   Coffee   1-2 diet cokes a day     INTOLERANCES Spaghetti sauce     SLEEP 6 is doing well     STRESS Very elevated   -- reports that the Wellbutrin is improving her stress       Patient arrives for assistance with weight loss.  Reviewed dietary recall and physical activity.  Set goals for next appointment. Notes that she has struggled with stress.  Reports she is going a little better. Notes that COVID has made it difficult for her because she got very sick.  Was not eating much.  Notes that the Wellbutrin is helping with her cravings and stress.  Notes struggling with giving up diet coke.         Estimated Daily Needs:  1510 calories (Mifflin-St Jeor equation for BMI > 25 using actual body weight, adjusted for weight loss)  72 g protein    Nutrition Diagnosis:  Overweight/obesity related to larger portions, limited physical activity and consumption of unbalanced meals as evidenced by BMI >30, 24 hour recall and no physical activity. (improved)       Nutrition Intervention:  Nutrition Counseling    Materials Provided:  N/A    Patient Stated Nutrition Goals:  1) Practice distraction when stress are elevated, daily  (100%)    Read   To be reviewed at next appointment     2) Choose fruits and vegetables - as snacks- daily (100%)    Purchase sweets in single serving containers    To be reviewed at next appointment   Add a cheese stick or nuts to reduce hunger at lunch     New Goal   1) Cut down to one diet coke a day.     Try juice a little juice and carbonated water          Goals Added to Visit Navigator?  Yes    Monitoring/Evaluation:  Progress towards goals:  baseline    Expected Compliance is:   Readiness for change action stage    Follow-up:  2 weeks     Length of visit was 34 minutes  2 unit(s) billed per insurance    Rita Ohara MS RD CDE         PCP: Alexandria Lodge, MD  Address on File: [address], [place] [address], [facility] [place]      Verified as Current Location: Yes  Extended Emergency Contact Information  Primary Emergency Contact: Minda [place]  Home Phone: [phone]  Relation: Spouse  Secondary Emergency Contact: Hatch,Bridgette   [place]  Home Phone: [phone]  Mobile Phone: [phone]  Relation: Daughter  Mother: Reggie [place] of [place]  Home Phone: [phone]     Verified Behavioral Health Emergency Contact: N/A    I spent 34 minutes on the phone with the patient. I spent an additional 10 minutes on pre- and post-visit activities.     The patient was physically located in [place] or a state in which I am permitted to provide care. The patient and/or parent/guardian understood that s/he may incur co-pays and cost sharing, and  agreed to the telemedicine visit. The visit was reasonable and appropriate under the circumstances given the patient's presentation at the time.    The patient and/or parent/guardian has been advised of the potential risks and limitations of this mode of treatment (including, but not limited to, the absence of in-person examination) and has agreed to be treated using telemedicine. The patient's/patient's family's questions regarding telemedicine have been answered.     If the visit was completed in an ambulatory setting, the patient and/or parent/guardian has also been advised to contact their provider???s office for worsening conditions, and seek emergency medical treatment and/or call 911 if the patient deems either necessary.

## 2019-07-08 ENCOUNTER — Institutional Professional Consult (permissible substitution)
Admit: 2019-07-08 | Discharge: 2019-07-09 | Payer: PRIVATE HEALTH INSURANCE | Attending: Registered" | Primary: Registered"

## 2019-07-10 DIAGNOSIS — J988 Other specified respiratory disorders: Principal | ICD-10-CM

## 2019-07-10 DIAGNOSIS — U071 COVID-19: Principal | ICD-10-CM

## 2019-07-10 NOTE — Unmapped (Signed)
Not our patient

## 2019-07-10 NOTE — Unmapped (Signed)
1) Practice distraction when stress are elevated, daily  (100%)    Read   To be reviewed at next appointment     2) Choose fruits and vegetables - as snacks- daily (100%)    Purchase sweets in single serving containers    To be reviewed at next appointment   Add a cheese stick or nuts to reduce hunger at lunch     New Goal   1) Cut down to one diet coke a day.     Try juice a little juice and carbonated water

## 2019-07-14 MED ORDER — ALBUTEROL SULFATE HFA 90 MCG/ACTUATION AEROSOL INHALER
0 refills | 0 days | Status: CP
Start: 2019-07-14 — End: ?

## 2019-07-18 NOTE — Unmapped (Signed)
[facility] Refill Coordination Note    Specialty Medication(s) to be Shipped:   Inflammatory Disorders: Benlysta    Other medication(s) to be shipped: n/a     CAELAN ATCHLEY, DOB: 1976/04/16  Phone: [phone] (home)       All above HIPAA information was verified with patient.     Was a Nurse, learning disability used for this call? No    Completed refill call assessment today to schedule patient's medication shipment from the [facility] [phone]).       Specialty medication(s) and dose(s) confirmed: Regimen is correct and unchanged.   Changes to medications: Lorin reports no changes at this time.  Changes to insurance: No  Questions for the pharmacist: No    Confirmed patient received Welcome Packet with first shipment. The patient will receive a drug information handout for each medication shipped and additional FDA Medication Guides as required.       DISEASE/MEDICATION-SPECIFIC INFORMATION        For patients on injectable medications: Patient currently has 1 doses left.  Next injection is scheduled for 3/5.    SPECIALTY MEDICATION ADHERENCE     Medication Adherence    Patient reported X missed doses in the last month: 0  Specialty Medication: Benlysta  Patient is on additional specialty medications: No  Patient is on more than two specialty medications: No  Any gaps in refill history greater than 2 weeks in the last 3 months: no  Demonstrates understanding of importance of adherence: yes  Informant: patient              Benlysta 200mg /ml: Patient has 7 days of medication on hand      SHIPPING     Shipping address confirmed in Epic.     Delivery Scheduled: Yes, Expected medication delivery date: 3/4.     Medication will be delivered via Same Day Courier to the prescription address in Epic WAM.    [name]   [facility] Specialty Technician

## 2019-07-24 MED FILL — BENLYSTA 200 MG/ML SUBCUTANEOUS AUTO-INJECTOR: 28 days supply | Qty: 4 | Fill #2 | Status: AC

## 2019-07-24 MED FILL — BENLYSTA 200 MG/ML SUBCUTANEOUS AUTO-INJECTOR: SUBCUTANEOUS | 28 days supply | Qty: 4 | Fill #2

## 2019-07-24 NOTE — Unmapped (Addendum)
[name] is a 44 y.o. WF with class 2 obesity due to long term steroid use (dx of SLE since age 60) .Her medical history includes SLE, cirrhosis, GERD, suspected OSA, depression, and emotional /stress eating. Pt wishes weight loss to prevent further liver damage.    Weight Summary:  1. Starting weight/BMI: 209 lb, 5' 1 -- BMI 39.6 (06/13/19 self reported)  2. Target weight/goal: 160 lb.  3. Today's weight/BMI: 205 lb, BMI 38.7 (07/25/19)  4. % Body weight loss: 1.9%  5. Waist Circumference: N/A    Referred by: Tonya Banter, MD in [facility].    GOALS: Continue working on reducing soda. Gym workout regularly with precautions.    I have reviewed the patient's medical history, lifestyle history and labs/tests.   My recommendations include the following:    Diet quality and patterning: Already established with our nutritionist, [name], and making good improvements. Endorses good portion control and having lots of fruits and vegetables in her meal/snack. Soda cut down to only 1 diet coke daily.   -- Will meet regularly with our nutritionist during the duration of medical management treatment. Discussed about healthier food choices and advised to home cook for less processed meals.    Exercise: She has joined a gym recently.  -- Goal of 30 minutes of exercise 5x a week or a total of 150 minutes weekly.    Sleep: Currently sleeping 6 hours without sleep aids. +OSA screening (STOP-BANG 3/8). Pt declined to do sleep study for now and wishes to focus on lifestyle changes.  -- Recommend 7-8 hours of sleep at night. Advised to try melatonin 10 mg for insomnia.     Stress management: Current stressors: domestic, health, world news, finance. She has an established therapist.  -- Recommend to try relaxation techniques such as breathing techniques, listening to music to help sleep at night and yoga stretching throughout the day to help control stress.     Weight gain causing medication: None.   Hx of long term steroid use (prednisone 80 mg daily for lupus).  Hx of Ambien for insomnia years ago.    Medication: Taking medications as instructed. Wellbutrin 150 mg daily but sometimes on 300 mg when her depression exacerbates. Continue current regimen.  CONTINUE wellbutrin 150 mg daily. CONTINUE naltrexone 25 mg BID.  Med List  - WELLBUTRIN: pt was started on this for depression before enrolling at our office. ?weight loss on it.  - NALTREXONE: started on 06/13/19 at 209 lb.  Tried / Contraindicated  - ORLISTAT: tried and failed.  - TOPAMAX: contraindicated by hx of kidney stone.  - PHENTERMINE: contraindicated by hx of palpitations.  - GLP1: will avoid due to strong family hx of thyroid cancer.    Obesity Surgery: Declines for now. Wishes to try medical management but will consider in the future. Pt is very open to this option and wishes to take aggressive measures to prevent progression of cirrhosis. May consider sending out a referral after 6th visit.    Medical conditions:  Glaucoma: no  Seizures: no  Medullary thyroid cancer (personal or family hx): Father and PGM both had thyroid cancer and  thyroidectomy but they are both deceased so pt couldn't tell what type. Will avoid GLP-1 agonists.  Multiple Endocrine Neoplasia: no  Palpitations/Tachycardia: YES  Chest Pain: no past history of MI.   Headaches/Migraines: YES  Nephrolithiasis: YES  H/o pancreatitis: YES, gallstone pancreatitis and she had cholecystectomy.  GERD: YES    Prior Surgeries:  Choleycystectomy: YES  Hysterectomy: YES    Obesity-associated comorbidities: Cirrhosis, GERD, suspected sleep apnea.  Current barriers include: Anxiety, depression, Sjogren's, lupus, emotional /stress eating, insomnia, fibromyalgia.  Birth Control Methods: N/A; s/p hysterectomy.

## 2019-07-24 NOTE — Unmapped (Addendum)
Patient Education        Learning About Weight-Loss (Bariatric) Surgery  What is weight-loss surgery?     Bariatric surgery is surgery to help you lose weight. This type of surgery is only used for people who are very overweight and have not been able to lose weight with diet and exercise.  This surgery makes the stomach smaller. Some types of surgery also change the connection between your stomach and intestines.  Having weight-loss surgery is a big step. After surgery, you'll need to make new, lifelong changes in how you eat and drink.  How is weight-loss surgery done?  Bariatric surgery may be either open or laparoscopic. Open surgery is done through a large cut (incision) in the belly. Laparoscopic surgery is done through several small cuts. The doctor puts a lighted tube, or scope, and other surgical tools through small cuts in your belly. The doctor is able to see your organs with the scope. There are different types of bariatric surgery.  Gastric sleeve surgery  The surgery is usually done through several small incisions in the belly. The doctor removes more than half of your stomach. This leaves a thin sleeve, or tube, that is about the size of a banana. Because part of your stomach has been removed, this can't be reversed.  Roux-en-Y gastric bypass surgery  Roux-en-Y (say roo-en-why) surgery changes the connection between the stomach and the intestines.  The doctor separates a section of your stomach from the rest of your stomach. This makes a small pouch. The new pouch will hold the food you eat. The doctor connects the stomach pouch to the middle part of the small intestine.  Gastric banding surgery  The surgery is usually done through several small incisions in the belly. The doctor wraps a band around the upper part of the stomach. This creates a small pouch. The small size of the pouch means that you will get full after you eat just a small amount of food. The doctor can inflate or deflate the band to adjust the size. This lets the doctor adjust how quickly food passes from the new pouch into the stomach. It does not change the connection between the stomach and the intestines.  What can you expect after the surgery?  You may stay in the hospital for one or more days after the surgery. How long you stay depends on the type of surgery you had.  Most people need 2 to 4 weeks before they are ready to get back to their usual routine.  For the first 2 to 6 weeks after surgery, you probably will need to follow a liquid or soft diet. Bit by bit, you will be able to eat more solid foods. Your doctor may advise you to work with a dietitian. This way you'll be sure to get enough protein, vitamins, and minerals while you are losing weight. Even with a healthy diet, you may need to take vitamin and mineral supplements.  After surgery, you will not be able to eat very much at one time. You will get full quickly. Try not to eat too much at one time or eat foods that are high in fat or sugar. If you do, you may vomit, get stomach pain, or have diarrhea.  You probably will lose weight very quickly in the first few months after surgery. As time goes on, your weight loss will slow down. You will have regular doctor visits to check how you are doing.  Think of  bariatric surgery as a tool to help you lose weight. It isn't an instant fix. You will still need to eat a healthy diet and get regular exercise. This will help you reach your weight goal and avoid regaining the weight you lose.  Follow-up care is a key part of your treatment and safety. Be sure to make and go to all appointments, and call your doctor if you are having problems. It's also a good idea to know your test results and keep a list of the medicines you take.  Where can you learn more?  Go to [facility] at https://myuncchart.org  Select Patient Education under [employer]. Enter G469 in the search box to learn more about Learning About Weight-Loss (Bariatric) Surgery.  Current as of: February 12, 2019??????????????????????????????Content Version: 12.8  ?? 2006-2021 [employer].   Care instructions adapted under license by [facility]. If you have questions about a medical condition or this instruction, always ask your healthcare professional. [employer], Inc[employer] disclaims any warranty or liability for your use of this information.

## 2019-07-24 NOTE — Unmapped (Signed)
[facility] Follow Up    Assessment/Plan:     Chief Complaint   Patient presents with   ??? Weight Loss       Problem List Items Addressed This Visit        Unprioritized    Class 2 obesity - Primary     [name] is a 44 y.o. WF with class 2 obesity due to long term steroid use (dx of SLE since age 65) .Her medical history includes SLE, cirrhosis, GERD, suspected OSA, depression, and emotional /stress eating. Pt wishes weight loss to prevent further liver damage.    Weight Summary:  1. Starting weight/BMI: 209 lb, 5' 1 -- BMI 39.6 (06/13/19 self reported)  2. Target weight/goal: 160 lb.  3. Today's weight/BMI: 205 lb, BMI 38.7 (07/25/19)  4. % Body weight loss: 1.9%  5. Waist Circumference: N/A    Referred by: [name], MD in [facility].    GOALS: Continue working on reducing soda. Gym workout regularly with precautions.    I have reviewed the patient's medical history, lifestyle history and labs/tests.   My recommendations include the following:    Diet quality and patterning: Already established with our nutritionist, [name], and making good improvements. Endorses good portion control and having lots of fruits and vegetables in her meal/snack. Soda cut down to only 1 diet coke daily.   -- Will meet regularly with our nutritionist during the duration of medical management treatment. Discussed about healthier food choices and advised to home cook for less processed meals.    Exercise: She has joined a gym recently.  -- Goal of 30 minutes of exercise 5x a week or a total of 150 minutes weekly.    Sleep: Currently sleeping 6 hours without sleep aids. +OSA screening (STOP-BANG 3/8). Pt declined to do sleep study for now and wishes to focus on lifestyle changes.  -- Recommend 7-8 hours of sleep at night. Advised to try melatonin 10 mg for insomnia.     Stress management: Current stressors: domestic, health, world news, finance. She has an established therapist.  -- Recommend to try relaxation techniques such as breathing techniques, listening to music to help sleep at night and yoga stretching throughout the day to help control stress.     Weight gain causing medication: None.   Hx of long term steroid use (prednisone 80 mg daily for lupus).  Hx of Ambien for insomnia years ago.    Medication: Taking medications as instructed. Wellbutrin 150 mg daily but sometimes on 300 mg when her depression exacerbates. Continue current regimen.  CONTINUE wellbutrin 150 mg daily. CONTINUE naltrexone 25 mg BID.  Med List  - WELLBUTRIN: pt was started on this for depression before enrolling at our office. ?weight loss on it.  - NALTREXONE: started on 06/13/19 at 209 lb.  Tried / Contraindicated  - ORLISTAT: tried and failed.  - TOPAMAX: contraindicated by hx of kidney stone.  - PHENTERMINE: contraindicated by hx of palpitations.  - GLP1: will avoid due to strong family hx of thyroid cancer.    Obesity Surgery: Declines for now. Wishes to try medical management but will consider in the future. Pt is very open to this option and wishes to take aggressive measures to prevent progression of cirrhosis. May consider sending out a referral after 6th visit.    Medical conditions:  Glaucoma: no  Seizures: no  Medullary thyroid cancer (personal or family hx): Father and PGM both had thyroid cancer and  thyroidectomy but they are both deceased so pt couldn't tell what type. Will avoid GLP-1 agonists.  Multiple Endocrine Neoplasia: no  Palpitations/Tachycardia: YES  Chest Pain: no past history of MI.   Headaches/Migraines: YES  Nephrolithiasis: YES  H/o pancreatitis: YES, gallstone pancreatitis and she had cholecystectomy.  GERD: YES    Prior Surgeries:  Choleycystectomy: YES  Hysterectomy: YES    Obesity-associated comorbidities: Cirrhosis, GERD, suspected sleep apnea.  Current barriers include: Anxiety, depression, Sjogren's, lupus, emotional /stress eating, insomnia, fibromyalgia.  Birth Control Methods: N/A; s/p hysterectomy.                Return in about 6 weeks (around 09/05/2019) for Weight clinic F/U one slot. naltrexone 25bid, welbut 150.    I have reviewed and addressed the patient???s adherence and response to prescribed medications. I have identified patient barriers to following the proposed medication and treatment plan, and have noted opportunities to optimize healthy behaviors. I have answered the patient???s questions to satisfaction and the patient voices understanding.    Time: Greater than 50% of this encounter was spent in direct consultation with the patient in evaluation and discussing all of the above. Duration of encounter: 25 minutes.     HPI:     Tonya Brady is a 44 y.o. year old female who has a  has a past medical history of Bulging lumbar disc (2005), Cirrhosis of liver (CMS-HCC), Difficult intravenous access, Fibromyalgia (03/09/2013), NASH (nonalcoholic steatohepatitis), Secondary Sjogren's syndrome (CMS-HCC) (03/09/2013), Systemic lupus erythematosus (CMS-HCC) (01/19/2011), and Valvular regurgitation. who presents today for [facility] follow up.    Weight Management History    Wt Readings from Last 6 Encounters:   07/25/19 93 kg (205 lb 1.9 oz)   07/10/19 94.9 kg (209 lb 1.8 oz)   06/13/19 95.1 kg (209 lb 11.2 oz)   05/12/19 95.3 kg (210 lb)   05/12/19 90.7 kg (200 lb)   04/28/19 90.7 kg (200 lb)     Update: 4 lb loss in 6 weeks. Pt had COVID infection recently and has been recovering well.   Medication: wellbutrin 150 mg daily and naltrexone 50 mg. Sometimes takes wellbutrin 300 mg when depression exacerbates with domestic issues.   Diet: cutting down on fast foods. 1 diet coke daily. Denies alcoholic or sugary beverage.  Exercise: she joined a gym and works out regularly.   Barrier: taking care of son with special needs.    Lab Results   Component Value Date    LDL Calculated 92 02/13/2019    LDL Cholesterol, Calculated 104 06/23/2011    Hemoglobin A1C 5.4 02/13/2019 Hemoglobin A1C 5.9 02/10/2016    Hemoglobin A1C 5.5 06/23/2011    TSH 2.500 11/10/2015    TSH 1.92 06/23/2011       Past Medical/Surgical History:     Past Medical History:   Diagnosis Date   ??? Bulging lumbar disc 2005   ??? Cirrhosis of liver (CMS-HCC)    ??? Difficult intravenous access    ??? Fibromyalgia 03/09/2013   ??? NASH (nonalcoholic steatohepatitis)    ??? Secondary Sjogren's syndrome (CMS-HCC) 03/09/2013    Secondary to SLE  Diagnosed: 12/2003 (by sx's, parotid gland enlargement, and serologies) Pathology: biopsy not done Labs: + ANA, anti-SSA, anti-SSB, RF    ??? Systemic lupus erythematosus (CMS-HCC) 01/19/2011   ??? Valvular regurgitation      Past Surgical History:   Procedure Laterality Date   ??? CARPAL TUNNEL RELEASE     ??? CESAREAN SECTION     ???  CHOLECYSTECTOMY     ??? HERNIA REPAIR     ??? PR REVISE MEDIAN N/CARPAL TUNNEL SURG Left 12/30/2018    Procedure: R16 NEUROPLASTY AND/OR TRANSPOSITION; MEDIAN NERVE AT CARPAL TUNNEL;  Surgeon: Theodora Blow Jacqlyn Krauss, MD;  Location: [facility] OR [facility];  Service: Orthopedics   ??? PR SALIVARY SURG UNLISTED PROC Right 12/06/2018    Procedure: R22 UNLISTED PROC SALIVARY GLANDS/DUCTS;  Surgeon: Michelle Piper, MD;  Location: ASC OR [facility];  Service: ENT   ??? PR SALIVARY SURG UNLISTED PROC Left 02/18/2019    Procedure: R25 UNLISTED PROC SALIVARY GLANDS/DUCTS;  Surgeon: Michelle Piper, MD;  Location: [facility] OR [facility];  Service: ENT   ??? PR UPPER GI ENDOSCOPY,DIAGNOSIS N/A 05/10/2016    Procedure: UGI ENDO, INCLUDE ESOPHAGUS, STOMACH, & DUODENUM &/OR JEJUNUM; DX W/WO COLLECTION SPECIMN, BY BRUSH OR WASH;  Surgeon: Carmon Ginsberg, MD;  Location: [facility];  Service: Gastroenterology   ??? PR UPPER GI ENDOSCOPY,DIAGNOSIS N/A 05/02/2019    Procedure: UGI ENDO, INCLUDE ESOPHAGUS, STOMACH, & DUODENUM &/OR JEJUNUM; DX W/WO COLLECTION SPECIMN, BY BRUSH OR WASH;  Surgeon: Chriss Driver, MD;  [facility];  Service: Gastroenterology   ??? TOTAL ABDOMINAL HYSTERECTOMY     ??? TUBAL LIGATION         Social History:     Social History     Socioeconomic History   ??? Marital status: Married     Spouse name: None   ??? Number of children: None   ??? Years of education: None   ??? Highest education level: None   Occupational History   ??? None   Social Needs   ??? Financial resource strain: None   ??? Food insecurity     Worry: None     Inability: None   ??? Transportation needs     Medical: None     Non-medical: None   Tobacco Use   ??? Smoking status: Never Smoker   ??? Smokeless tobacco: Never Used   Substance and Sexual Activity   ??? Alcohol use: No     Alcohol/week: 0.0 standard drinks   ??? Drug use: No   ??? Sexual activity: Yes     Partners: Male   Lifestyle   ??? Physical activity     Days per week: None     Minutes per session: None   ??? Stress: None   Relationships   ??? Social Wellsite geologist on phone: None     Gets together: None     Attends religious service: None     Active member of club or organization: None     Attends meetings of clubs or organizations: None     Relationship status: None   Other Topics Concern   ??? Do you use sunscreen? Yes   ??? Tanning bed use? No   ??? Are you easily burned? No   ??? Excessive sun exposure? No   ??? Blistering sunburns? No   Social History Narrative   ??? None       Family History:     Family History   Problem Relation Age of Onset   ??? Cancer Mother    ??? Hypertension Mother    ??? Heart disease Father    ??? Depression Son    ??? Mental illness Son    ??? Aneurysm Maternal Aunt    ??? Melanoma Neg Hx    ??? Basal cell carcinoma Neg Hx    ???  Squamous cell carcinoma Neg Hx    ??? Drug abuse Neg Hx    ??? Stroke Neg Hx        Allergies:     Aspirin, Fish containing products, Iodine, and Shellfish containing products    Current Medications:     Current Outpatient Medications   Medication Sig Dispense Refill   ??? albuterol HFA 90 mcg/actuation inhaler INHALE 2 PUFFS BY MOUTH EVERY 6 HOURS AS NEEDED FOR WHEEZING 6.7 g 0   ??? belimumab (BENLYSTA) 200 mg/mL AtIn Inject the contents of 1 syringe (200 mg) under the skin every seven (7) days. 4 mL 2   ??? buPROPion (WELLBUTRIN XL) 150 MG 24 hr tablet Take 1 tablet (150 mg total) by mouth every morning. 30 tablet 2   ??? famotidine (PEPCID) 40 MG tablet Take 1 tablet (40 mg total) by mouth nightly as needed for heartburn. 90 tablet 3   ??? hydrOXYchloroQUINE (PLAQUENIL) 200 mg tablet Take 1 tablet (200 mg total) by mouth Two (2) times a day. 60 tablet 1   ??? ibuprofen (MOTRIN) 800 MG tablet ibuprofen 800 mg tablet     ??? inhalational spacing device (AEROCHAMBER MV) Spcr 1 each by Miscellaneous route every six (6) hours as needed. 1 each 0   ??? naltrexone (DEPADE) 50 mg tablet Take 0.5 tablets (25 mg total) by mouth two (2) times a day. First two weeks---start 0.5 tablet once daily. 60 tablet 2     No current facility-administered medications for this visit.        I have reviewed and (if needed) updated the patient's problem list, medications, allergies, past medical and surgical history, social and family history.    ROS:     Unless otherwise stated in the HPI:  CONSTITUTIONAL: no fever chills.   HEENT: Eyes: No diplopia or blurred vision. ENT: No earache, sore throat or runny nose.   CARDIOVASCULAR: No pressure, squeezing, strangling, tightness, heaviness or aching about the chest, neck, axilla or epigastrium.   RESPIRATORY: No cough, shortness of breath, PND or orthopnea.   GASTROINTESTINAL: No nausea, vomiting or diarrhea.   GENITOURINARY: No dysuria, frequency or urgency.   MUSCULOSKELETAL: no new pains, no joint swelling or redness.   SKIN: No change in skin, hair or nails.   NEUROLOGIC: No paresthesias, fasciculations, seizures or weakness.   PSYCHIATRIC: No disorder of thought or mood.   ENDOCRINE: No heat or cold intolerance, polyuria or polydipsia.   HEMATOLOGICAL: No easy bruising or bleeding.    Vital Signs:     Wt Readings from Last 3 Encounters:   07/25/19 93 kg (205 lb 1.9 oz)   07/10/19 94.9 kg (209 lb 1.8 oz)   06/13/19 95.1 kg (209 lb 11.2 oz)     Temp Readings from Last 3 Encounters:   07/25/19 37.1 ??C (98.7 ??F) (Temporal)   06/19/19 36.5 ??C (97.7 ??F) (Temporal)   06/18/19 36.8 ??C (98.2 ??F) (Oral)     BP Readings from Last 3 Encounters:   07/25/19 118/70   06/19/19 134/65   06/18/19 112/55     Pulse Readings from Last 3 Encounters:   07/25/19 62   06/19/19 86   06/18/19 73     Body mass index is 38.76 kg/m??.     Physical Exam:     General: well appearing, in NAD, Body mass index is 38.76 kg/m??. Ambulatory without help.  Body fat distribution: central & gluteofemoral adiposity. No supraclavicular adiposity. No dorsal adiposity.  Head: normocephalic atraumatic.  Eyes: PERRLA, EOMI, Sclera WNL.   Oral pharynx: moist, no exudate, no erythema, not enlarged. Good dental hygiene. Moderate OP crowding.  Neck: supple, no LAD, no thyromegaly, no bruit.  CV: RRR no rubs or murmurs. Peripheral edema: none.  Lungs: clear bilaterally to ascultation. No wheezing.  Abdomen: soft, NT/ND. No intertrigo. Moderate pannus.  Extremities: no clubbing, cyanosis, or edema.  Skin: no concerning lesions, rashes observed. No lipomas, no acanthosis nigricans.  Neuro: Alert and oriented X 3.    Labs:     No visits with results within 1 Month(s) from this visit.   Latest known visit with results is:   Office Visit on 06/17/2019   Component Date Value Ref Range Status   ??? SARS-CoV-2 PCR 06/17/2019 Detected* Not Detected Final       Follow-up:     Recommend well adult check/physical in 1 year. Otherwise, follow up as below.  Return in about 6 weeks (around 09/05/2019) for Weight clinic F/U one slot. naltrexone 25bid, welbut 150.    Health maintenance reviewed and recommendations made based on [place] Preventative Task Force (USPTF) recommendations. Reviewed appropriate diet and exercise. Patient stated understanding and there were no barriers to learning.     I attest that I, Namwoo J Cho, personally documented this note while acting as scribe for [employer], MD.      Bo Mcclintock, Scribe.  07/25/2019     The documentation recorded by the scribe accurately reflects the service I personally performed and the decisions made by me.    Marshell Garfinkel, MD

## 2019-07-25 ENCOUNTER — Ambulatory Visit
Admit: 2019-07-25 | Discharge: 2019-07-26 | Payer: PRIVATE HEALTH INSURANCE | Attending: Family Medicine | Primary: Family Medicine

## 2019-07-25 DIAGNOSIS — E669 Obesity, unspecified: Principal | ICD-10-CM

## 2019-08-05 ENCOUNTER — Institutional Professional Consult (permissible substitution)
Admit: 2019-08-05 | Discharge: 2019-08-06 | Payer: PRIVATE HEALTH INSURANCE | Attending: Registered" | Primary: Registered"

## 2019-08-05 NOTE — Unmapped (Signed)
Nutrition Consult Note    Referring Provider:  Self, Referred    Reason for Referral:  Nutrition Counseling      Medical History:  Patient Active Problem List   Diagnosis   ??? Systemic lupus erythematosus (CMS-HCC)   ??? Allergic rhinitis   ??? Secondary Sjogren's syndrome (CMS-HCC)   ??? Fibromyalgia   ??? Restless leg syndrome   ??? Thrombocytopenia (CMS-HCC)   ??? Palpitations   ??? Morbid obesity due to excess calories (CMS-HCC)   ??? Chronic abdominal pain   ??? 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 Moderate episode of recurrent major depressive disorder (CMS-HCC)   ??? Gastroesophageal reflux disease   ??? GAD (generalized anxiety disorder)   ??? Acute left ankle pain   ??? Acute carpal tunnel syndrome of left wrist   ??? Annual physical exam   ??? Acquired trigger finger   ??? Swelling of right parotid gland   ??? Swelling of left parotid gland   ??? Acute cystitis without hematuria   ??? Class 2 obesity   ??? Insomnia   ??? COVID-19       Barriers to Care:  acuity of illness    - Weight from last MD appointment   Present height 154.9 cm (5' 1)  Present weight 93 kg (205 lb)   Current BMI (!) 38.75    Weight Status Categories:  Underweight:  BMI < 18.5  Normal Weight:  BMI 18.5 - 24.9  Overweight:  BMI 25 - 29.9  Obesity Class I:  BMI 30 - 34.9  Obesity Class II:  BMI 35 - 39.9  Obesity Class III:  BMI ? 40    Weight History:  Wt Readings from Last 6 Encounters:   08/06/19 93 kg (205 lb)   07/25/19 93 kg (205 lb 1.9 oz)   07/10/19 94.9 kg (209 lb 1.8 oz)   06/13/19 95.1 kg (209 lb 11.2 oz)   05/12/19 95.3 kg (210 lb)   05/12/19 90.7 kg (200 lb)       Allergies:   Allergies   Allergen Reactions   ??? Aspirin Swelling     Other reaction(s): SWELLING/EDEMA   ??? Fish Containing Products Diarrhea and Nausea And Vomiting   ??? Iodine Other (See Comments)     Pt told not to have d/t shellfish allergy   ??? Shellfish Containing Products Hives, Diarrhea and Nausea Only     Other reaction(s): HIVES  Other reaction(s): HIVES Other reaction(s): NAUSEA       Relevant Medications, Herbs, Supplements:    Current Outpatient Medications:   ???  albuterol HFA 90 mcg/actuation inhaler, INHALE 2 PUFFS BY MOUTH EVERY 6 HOURS AS NEEDED FOR WHEEZING, Disp: 6.7 g, Rfl: 0  ???  belimumab (BENLYSTA) 200 mg/mL AtIn, Inject the contents of 1 syringe (200 mg) under the skin every seven (7) days., Disp: 4 mL, Rfl: 2  ???  buPROPion (WELLBUTRIN XL) 150 MG 24 hr tablet, Take 1 tablet (150 mg total) by mouth every morning., Disp: 30 tablet, Rfl: 2  ???  famotidine (PEPCID) 40 MG tablet, Take 1 tablet (40 mg total) by mouth nightly as needed for heartburn., Disp: 90 tablet, Rfl: 3  ???  hydrOXYchloroQUINE (PLAQUENIL) 200 mg tablet, Take 1 tablet (200 mg total) by mouth Two (2) times a day., Disp: 60 tablet, Rfl: 1  ???  ibuprofen (MOTRIN) 800 MG tablet, ibuprofen 800 mg tablet, Disp: , Rfl:   ???  inhalational spacing device (AEROCHAMBER MV) Spcr,  1 each by Miscellaneous route every six (6) hours as needed., Disp: 1 each, Rfl: 0  ???  naltrexone (DEPADE) 50 mg tablet, Take 0.5 tablets (25 mg total) by mouth two (2) times a day. First two weeks---start 0.5 tablet once daily., Disp: 60 tablet, Rfl: 2    Do you have any concerns about taking or affording your medications?  None    Relevant Labs:  Hemoglobin A1C (%)   Date Value   02/13/2019 5.4   11/28/2017 5.2   10/10/2016 5.6   02/10/2016 5.9   06/23/2011 5.5      No components found for: LDLCALC   BP Readings from Last 3 Encounters:   07/25/19 118/70   06/19/19 134/65   06/18/19 112/55     Lab Results   Component Value Date    CHOL 148 02/13/2019    CHOL 160 11/28/2017    CHOL 165 06/23/2011     Lab Results   Component Value Date    HDL 37 (L) 02/13/2019    HDL 44 11/28/2017    HDL 35 (L) 06/23/2011     Lab Results   Component Value Date    LDL 92 02/13/2019    LDL 103 (H) 11/28/2017    LDL 104 06/23/2011     Lab Results   Component Value Date    VLDL 18.6 02/13/2019    VLDL 13.2 11/28/2017     Lab Results   Component Value Date    CHOLHDLRATIO 4.0 02/13/2019    CHOLHDLRATIO 3.6 11/28/2017     Lab Results   Component Value Date    TRIG 93 02/13/2019    TRIG 66 11/28/2017    TRIG 132 06/23/2011       No results found for: VITDTOTAL  Lab Results   Component Value Date    VITAMINB12 >1000 (H) 06/23/2011       Physical Activity:  Treadmill   Added in weights     24-Hour recall/usual intake:    Time Intake   Breakfast      Snack (AM)    Lunch 5 chicken nuggets and 1/2 fries   -- salad and sandwich   -- bowl of soup   -- Raman     Yogurt      Snack (PM)    Dinner     Grilled Chicken and Green Beans     Wheat Noodles spaghetti sauce   Salad with low fat dressing      Snack (HS)      Other Nutrition Information:  HISTORY Cirosis of the liver - 3 years ago   Notes that weight has been stable for a few months     USUAL INTAKES two-three meals   Hardly ate for the weeks she had COVID    SNACKING Limited snacking at this time     GROCERY SHOPPING 1-2 times a week   -8-9 people in the house    COOKING   -- trying to cut down on bread pasta     DINING OUT 1x per week   Pizza - once a week     BEVERAGES Water - has been putting lemon in the water   Coffee   1 diet coke a day  - discussed just switching to water and lemon     INTOLERANCES Spaghetti sauce     SLEEP 6 is doing well     STRESS Very elevated   -- reports that the Wellbutrin is improving her stress  Patient arrives for assistance with weight loss.  Reviewed dietary recall and physical activity.  Set goals for next appointment. Notes that she has struggled with stress.  Tracking calories 1200 to 1500 calories.  Notes that she feels frustrated by the slow weight loss.      Estimated Daily Needs:  1510 calories (Mifflin-St Jeor equation for BMI > 25 using actual body weight, adjusted for weight loss)  72 g protein    Nutrition Diagnosis:  Overweight/obesity related to larger portions, limited physical activity and consumption of unbalanced meals as evidenced by BMI >30, 24 hour recall and no physical activity. (improved)       Nutrition Intervention:  Nutrition Counseling    Materials Provided:  N/A    Patient Stated Nutrition Goals:  1) Practice distraction when stress are elevated, daily  (100%)    Read   To be reviewed at next appointment     2) Choose fruits and vegetables - as snacks- daily (100%)    Purchase sweets in single serving containers    To be reviewed at next appointment   Add a cheese stick or nuts to reduce hunger at lunch     New Goal   1) Eliminate diet soda    Have water and lemon instead    Unsweet tea with lemon       Goals Added to Visit Navigator?  Yes    Monitoring/Evaluation:  Progress towards goals:  baseline    Expected Compliance is:   Readiness for change action stage    Follow-up:  2 weeks     Length of visit was 28 minutes  2 unit(s) billed per insurance    Rita Ohara MS RD CDE         PCP: Alexandria Lodge, MD  Address on File: [address], [place] [address], [facility] [place]      Verified as Current Location: Yes  Extended Emergency Contact Information  Primary Emergency Contact: Minda D[place]  Home Phone: [phone]  Relation: Spouse  Secondary Emergency Contact: Hatch,Bridgette   [place]  Home Phone: [phone]  Mobile Phone: [phone]  Relation: Daughter  Mother: Reggie P[place] of [place]  Home Phone: [phone]     Verified Behavioral Health Emergency Contact: N/A    I spent 28 minutes on the phone with the patient. I spent an additional 10 minutes on pre- and post-visit activities.     The patient was physically located in [place] or a state in which I am permitted to provide care. The patient and/or parent/guardian understood that s/he may incur co-pays and cost sharing, and agreed to the telemedicine visit. The visit was reasonable and appropriate under the circumstances given the patient's presentation at the time.    The patient and/or parent/guardian has been advised of the potential risks and limitations of this mode of treatment (including, but not limited to, the absence of in-person examination) and has agreed to be treated using telemedicine. The patient's/patient's family's questions regarding telemedicine have been answered.     If the visit was completed in an ambulatory setting, the patient and/or parent/guardian has also been advised to contact their provider???s office for worsening conditions, and seek emergency medical treatment and/or call 911 if the patient deems either necessary.

## 2019-08-06 NOTE — Unmapped (Signed)
1) Practice distraction when stress are elevated, daily  (100%)    Read   To be reviewed at next appointment     2) Choose fruits and vegetables - as snacks- daily (100%)    Purchase sweets in single serving containers    To be reviewed at next appointment   Add a cheese stick or nuts to reduce hunger at lunch     New Goal   1) Eliminate diet soda    Have water and lemon instead

## 2019-08-12 DIAGNOSIS — M329 Systemic lupus erythematosus, unspecified: Principal | ICD-10-CM

## 2019-08-12 NOTE — Unmapped (Signed)
Hydroxychloroquine refill  Last ov: 02/10/2019  Next ov: 09/03/2019

## 2019-08-13 MED ORDER — HYDROXYCHLOROQUINE 200 MG TABLET
ORAL_TABLET | Freq: Two times a day (BID) | ORAL | 5 refills | 30.00000 days | Status: CP
Start: 2019-08-13 — End: ?

## 2019-08-15 DIAGNOSIS — M329 Systemic lupus erythematosus, unspecified: Principal | ICD-10-CM

## 2019-08-15 NOTE — Unmapped (Signed)
[facility] Refill Coordination Note    Specialty Medication(s) to be Shipped:   Inflammatory Disorders: Benlysta    Other medication(s) to be shipped: n/a     DORANN DAVIDSON, DOB: January 22, 1976  Phone: [phone] (home)       All above HIPAA information was verified with patient.     Was a Nurse, learning disability used for this call? No    Completed refill call assessment today to schedule patient's medication shipment from the [facility] [phone]).       Specialty medication(s) and dose(s) confirmed: Regimen is correct and unchanged.   Changes to medications: Maylin reports no changes at this time.  Changes to insurance: No  Questions for the pharmacist: No    Confirmed patient received Welcome Packet with first shipment. The patient will receive a drug information handout for each medication shipped and additional FDA Medication Guides as required.       DISEASE/MEDICATION-SPECIFIC INFORMATION        For patients on injectable medications: Patient currently has 1 doses left.  Next injection is scheduled for 4/1.    SPECIALTY MEDICATION ADHERENCE     Medication Adherence    Patient reported X missed doses in the last month: 0  Specialty Medication: Benlysta  Patient is on additional specialty medications: No  Patient is on more than two specialty medications: No  Any gaps in refill history greater than 2 weeks in the last 3 months: no  Demonstrates understanding of importance of adherence: yes  Informant: patient                Benlysta 200mg /ml: Patient has 7 days of medication on hand       SHIPPING     Shipping address confirmed in Epic.     Delivery Scheduled: Yes, Expected medication delivery date: 4/6.  However, Rx request for refills was sent to the provider as there are none remaining.     Medication will be delivered via Same Day Courier to the prescription address in Epic WAM.    [name]   [facility] Specialty Technician

## 2019-08-19 DIAGNOSIS — M329 Systemic lupus erythematosus, unspecified: Principal | ICD-10-CM

## 2019-08-19 MED ORDER — BENLYSTA 200 MG/ML SUBCUTANEOUS AUTO-INJECTOR
SUBCUTANEOUS | 2 refills | 28 days | Status: CP
Start: 2019-08-19 — End: ?
  Filled 2019-08-26: qty 4, 28d supply, fill #0

## 2019-08-19 NOTE — Unmapped (Signed)
Belimumab (Benlysta) refill  Last ov: 02/10/19   Next ov: 09/03/2019     Labs:   AST   Date Value Ref Range Status   03/07/2019 23 14 - 38 U/L Final   07/26/2010 19 14 - 38 U/L Final     ALT   Date Value Ref Range Status   03/07/2019 25 <35 U/L Final   07/26/2010 33 15 - 48 U/L Final     Creatinine/CP   Date Value Ref Range Status   05/29/2011 0.75 0.60 - 1.00 MG/DL Final     Creatinine   Date Value Ref Range Status   03/07/2019 0.76 0.60 - 1.00 mg/dL Final   29/56/2130 8.65 0.60 - 1.00 mg/dL Final     WBC   Date Value Ref Range Status   03/07/2019 3.6 (L) 4.5 - 11.0 10*9/L Final   06/30/2013 2.4 (L) 4.5 - 11.0 10*9/L Final     HGB   Date Value Ref Range Status   03/07/2019 13.6 12.0 - 16.0 g/dL Final   78/46/9629 52.8 12.0 - 16.0 g/dL Final     HCT   Date Value Ref Range Status   03/07/2019 41.8 36.0 - 46.0 % Final   06/30/2013 39.2 36.0 - 46.0 % Final     MCV   Date Value Ref Range Status   03/07/2019 93.9 80.0 - 100.0 fL Final   06/30/2013 89 80 - 100 fL Final     RDW   Date Value Ref Range Status   03/07/2019 13.0 12.0 - 15.0 % Final   06/30/2013 13.3 12.0 - 15.0 % Final     Platelet   Date Value Ref Range Status   03/07/2019 149 (L) 150 - 440 10*9/L Final   06/30/2013 149 (L) 150 - 440 10*9/L Final     Neutrophils %   Date Value Ref Range Status   02/13/2019 68.4 % Final     Lymphocytes %   Date Value Ref Range Status   02/13/2019 21.1 % Final     Monocytes %   Date Value Ref Range Status   02/13/2019 6.2 % Final     Eosinophils %   Date Value Ref Range Status   02/13/2019 2.0 % Final     Basophils %   Date Value Ref Range Status   02/13/2019 0.3 % Final

## 2019-08-19 NOTE — Unmapped (Signed)
Per test claim for [facility], patient needs Medication Assistance Program for Prior Authorization.

## 2019-08-26 MED FILL — BENLYSTA 200 MG/ML SUBCUTANEOUS AUTO-INJECTOR: 28 days supply | Qty: 4 | Fill #0 | Status: AC

## 2019-08-29 NOTE — Unmapped (Signed)
Nutrition Consult Note    Referring Provider:  Alvy Beal*    Reason for Referral:  Nutrition Counseling      Medical History:  Patient Active Problem List   Diagnosis   ??? Systemic lupus erythematosus (CMS-HCC)   ??? Allergic rhinitis   ??? Secondary Sjogren's syndrome (CMS-HCC)   ??? Fibromyalgia   ??? Restless leg syndrome   ??? Thrombocytopenia (CMS-HCC)   ??? Palpitations   ??? Morbid obesity due to excess calories (CMS-HCC)   ??? Chronic abdominal pain   ??? 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 Moderate episode of recurrent major depressive disorder (CMS-HCC)   ??? Gastroesophageal reflux disease   ??? GAD (generalized anxiety disorder)   ??? Acute left ankle pain   ??? Acute carpal tunnel syndrome of left wrist   ??? Annual physical exam   ??? Acquired trigger finger   ??? Swelling of right parotid gland   ??? Swelling of left parotid gland   ??? Acute cystitis without hematuria   ??? Class 2 obesity   ??? Insomnia   ??? COVID-19       Barriers to Care:  acuity of illness    - Self Reported   Present height 154.9 cm (5' 0.98)  Present weight 90.7 kg (200 lb)    Current BMI (!) 37.81      Weight Status Categories:  Underweight:  BMI < 18.5  Normal Weight:  BMI 18.5 - 24.9  Overweight:  BMI 25 - 29.9  Obesity Class I:  BMI 30 - 34.9  Obesity Class II:  BMI 35 - 39.9  Obesity Class III:  BMI ? 40    Weight History:  Wt Readings from Last 6 Encounters:   09/02/19 90.7 kg (200 lb)   08/06/19 93 kg (205 lb)   07/25/19 93 kg (205 lb 1.9 oz)   07/10/19 94.9 kg (209 lb 1.8 oz)   06/13/19 95.1 kg (209 lb 11.2 oz)   05/12/19 95.3 kg (210 lb)       Allergies:   Allergies   Allergen Reactions   ??? Aspirin Swelling     Other reaction(s): SWELLING/EDEMA   ??? Fish Containing Products Diarrhea and Nausea And Vomiting   ??? Iodine Other (See Comments)     Pt told not to have d/t shellfish allergy   ??? Shellfish Containing Products Hives, Diarrhea and Nausea Only     Other reaction(s): HIVES  Other reaction(s): HIVES Other reaction(s): NAUSEA       Relevant Medications, Herbs, Supplements:    Current Outpatient Medications:   ???  albuterol HFA 90 mcg/actuation inhaler, INHALE 2 PUFFS BY MOUTH EVERY 6 HOURS AS NEEDED FOR WHEEZING, Disp: 6.7 g, Rfl: 0  ???  belimumab (BENLYSTA) 200 mg/mL AtIn, Inject the contents of 1 syringe (200 mg) under the skin every seven (7) days., Disp: 4 mL, Rfl: 2  ???  buPROPion (WELLBUTRIN XL) 150 MG 24 hr tablet, Take 1 tablet (150 mg total) by mouth every morning., Disp: 30 tablet, Rfl: 2  ???  famotidine (PEPCID) 40 MG tablet, Take 1 tablet (40 mg total) by mouth nightly as needed for heartburn., Disp: 90 tablet, Rfl: 3  ???  hydrOXYchloroQUINE (PLAQUENIL) 200 mg tablet, Take 1 tablet (200 mg total) by mouth Two (2) times a day., Disp: 60 tablet, Rfl: 5  ???  ibuprofen (MOTRIN) 800 MG tablet, ibuprofen 800 mg tablet, Disp: , Rfl:   ???  inhalational spacing device (AEROCHAMBER MV)  Spcr, 1 each by Miscellaneous route every six (6) hours as needed., Disp: 1 each, Rfl: 0  ???  naltrexone (DEPADE) 50 mg tablet, Take 0.5 tablets (25 mg total) by mouth two (2) times a day. First two weeks---start 0.5 tablet once daily., Disp: 60 tablet, Rfl: 2    Do you have any concerns about taking or affording your medications?  None    Relevant Labs:  Hemoglobin A1C (%)   Date Value   02/13/2019 5.4   11/28/2017 5.2   10/10/2016 5.6   02/10/2016 5.9   06/23/2011 5.5      No components found for: LDLCALC   BP Readings from Last 3 Encounters:   07/25/19 118/70   06/19/19 134/65   06/18/19 112/55     Lab Results   Component Value Date    CHOL 148 02/13/2019    CHOL 160 11/28/2017    CHOL 165 06/23/2011     Lab Results   Component Value Date    HDL 37 (L) 02/13/2019    HDL 44 11/28/2017    HDL 35 (L) 06/23/2011     Lab Results   Component Value Date    LDL 92 02/13/2019    LDL 103 (H) 11/28/2017    LDL 104 06/23/2011     Lab Results   Component Value Date    VLDL 18.6 02/13/2019    VLDL 13.2 11/28/2017     Lab Results   Component Value Date    CHOLHDLRATIO 4.0 02/13/2019    CHOLHDLRATIO 3.6 11/28/2017     Lab Results   Component Value Date    TRIG 93 02/13/2019    TRIG 66 11/28/2017    TRIG 132 06/23/2011       No results found for: VITDTOTAL  Lab Results   Component Value Date    VITAMINB12 >1000 (H) 06/23/2011       Physical Activity:  More yard work  Middle schoolers has been playing football and baseball which has increased her activity     24-Hour recall/usual intake:    Time Intake   Breakfast Slim Fast Shake (every morning)      Snack (AM)    Lunch 5 chicken nuggets and 1/2 fries   -- salad and sandwich   -- bowl of soup   -- Raman     Yogurt     6 baked chicken nuggets   1/2 banana      Snack (PM)    Dinner     [employer] of nut mix     Snack (HS)      Other Nutrition Information:  HISTORY Cirosis of the liver - 3 years ago =  Traveled to the beach   Sunday dinners are sometimes different because son comes to dinner   Notes that she has cut the portions     Feels she is doing better this time - notes she is being kinder to herself     USUAL INTAKES two-three meals   Hardly ate for the weeks she had COVID    SNACKING Limited snacking at this time   Having fewer packaged snacks  1/2 banana   Nuts and fruit packets     GROCERY SHOPPING 1-2 times a week   -8-9 people in the house  -- has started keeping things out of the house including chips   -- keeping some things out of the house     COOKING   -- one time a day  DINING OUT 1x per week   Brett Albino - once a week     BEVERAGES  1 diet coke a day  Water   -- notes she is only having diet coke some days  -- mostly seltzer and bottled water     INTOLERANCES Spaghetti sauce     SLEEP 6 is doing well     STRESS Very elevated   -- reports that the Wellbutrin is improving her stress       Patient arrives for assistance with weight loss.  Reviewed dietary recall and physical activity.  Set goals for next appointment. Notes that she got off a little with the beach and easter and weight was up a little.  Activity has increased and yard work has increased.  Feels her portions have reduced which is helpful.  Has cut back on diet beverages.      Estimated Daily Needs:  1510 calories (Mifflin-St Jeor equation for BMI > 25 using actual body weight, adjusted for weight loss)  72 g protein      Nutrition Diagnosis:  Overweight/obesity related to larger portions, limited physical activity and consumption of unbalanced meals as evidenced by BMI >30, 24 hour recall and no physical activity. (improved)       Nutrition Intervention:  Nutrition Counseling    Materials Provided:  N/A    Patient Stated Nutrition Goals:  1) Practice distraction when stress are elevated, daily  (100%)    Read   To be reviewed at next appointment     2) Choose fruits and vegetables - as snacks- daily (100%)    Purchase sweets in single serving containers    To be reviewed at next appointment   Add a cheese stick or nuts to reduce hunger at lunch     1) Eliminate diet soda (75%)    Have water and lemon instead    Unsweet tea with lemon       New Goal    1) Choose one fun food once a week.    Focus on portion       Goals Added to Visit Navigator?  Yes    Monitoring/Evaluation:  Progress towards goals:  baseline    Expected Compliance is:   Readiness for change action stage    Follow-up:  4 weeks     Length of visit was 24 minutes  2 unit(s) billed per insurance    Rita Ohara MS RD CDE         PCP: Alexandria Lodge, MD  Address on File: [address], [place] [address], [facility] [place]      Verified as Current Location: Yes  Extended Emergency Contact Information  Primary Emergency Contact: Minda Ditto [place]  Home Phone: [phone]  Relation: Spouse  Secondary Emergency Contact: Hatch,Bridgette   [place]  Home Phone: [phone]  Mobile Phone: [phone]  Relation: Daughter  Mother: Reggie Pile of [place]  Home Phone: [phone]     Verified Behavioral Health Emergency Contact: N/A    I spent 24 minutes on the phone with the patient. I spent an additional 10 minutes on pre- and post-visit activities.     The patient was physically located in [place] or a state in which I am permitted to provide care. The patient and/or parent/guardian understood that s/he may incur co-pays and cost sharing, and agreed to the telemedicine visit. The visit was reasonable and appropriate under the circumstances given  the patient's presentation at the time.    The patient and/or parent/guardian has been advised of the potential risks and limitations of this mode of treatment (including, but not limited to, the absence of in-person examination) and has agreed to be treated using telemedicine. The patient's/patient's family's questions regarding telemedicine have been answered.     If the visit was completed in an ambulatory setting, the patient and/or parent/guardian has also been advised to contact their provider???s office for worsening conditions, and seek emergency medical treatment and/or call 911 if the patient deems either necessary.

## 2019-09-02 ENCOUNTER — Institutional Professional Consult (permissible substitution)
Admit: 2019-09-02 | Discharge: 2019-09-03 | Payer: PRIVATE HEALTH INSURANCE | Attending: Registered" | Primary: Registered"

## 2019-09-02 DIAGNOSIS — E669 Obesity, unspecified: Principal | ICD-10-CM

## 2019-09-02 NOTE — Unmapped (Signed)
RHEUMATOLOGY CLINIC FOLLOW-UP NOTE      Assessment/Plan:      SLE, Sjogrens  Briefly: Diagnosed 2005 with SLE/Sjogrens which has been characterized by +ANA, +SSA, +SSB, +RF, ACLE/malar rashes, arthritis, parotid gland swelling. She has been treated with long-standing HCQ, prior azathioprine [discontinued 2/2 nausea], MTX [discontinued 2/2 GI effects, LFTs]. She initiated belimumab 2020. Since her last visit she is feeling overall well without significant flare-ups to warrant changes in therapy.  -- Labs: SLE monitoring labs ordered; SPEP/IF  -- Medications: Cont HCQ 200mg  BID, belimumab weekly; sent Rx for topical diclofenac for hand pain given good prior success with this  -- Imaging: Reviewed per HPI    Healthcare maintenance  -- Vaccinations: Covid vaccine recommendations discussed today  -- HCQ: last evaluation 05/2019 -- due 2022  -- OBGYN referral placed per patient request - has been > 10 years since last evaluation    RTC in Return in about 6 months (around 03/04/2020).    This patient was seen and discussed with Dr. Berton Lan who agrees with the plan outlined above.    Subjective:   Primary Care Provider: Alexandria Lodge, MD    HPI:  Tonya Brady is a 44 y.o.  female with a PMH of SLE and secondary Sjogrens, NASH cirrhosis, GERD, anxiety presenting for follow-up. Last seen in clinic 9/20201 at which time she was continued on belimumab, HCQ.     Since last visit patient has the following medical updates:   -- 9/020 underwent L parotid gland sialendoscopy. Overall sialendoscopy procedures have provided significant relief in her parotid swelling symptoms. She will continue follow-up for repeat procedure PRN.  -- 04/2019 EGD for variceal screen. 04/2019 diagnosed with UTI. 05/2019 diagnosed with COVID. Experienced significant side effects with Regeneron with anaphylaxis. She feels recovered from covid and is just now getting her sense of taste/smell to return.   -- Followed with weight management clinic. Today she is feeling overall well. She hasn't felt this well in a while.    During her covid infection she held belimumab for 3 doses and has now subsequently resolved. Feels improved since re-initaition of belimumab. She continues on HCQ 200mg  BID.    MSK: She notes pain in her hands particularly with use which can cause sensation of swelling and knots. If she is standing for prolonged periods of time she will intermittently experience foot/ankle swelling. She has been doing more  activity such as pitching a softball that causes soreness in her right shoulder - improved with ibuprofen. She previously tried topical diclofenac for pain in her hands with success. She is using biofreeze for other joint pains/soreness with improvement. She feels like being more active also helps with joint pain overall. She started working out at gym.    CUTANEOUS: Notes mild photosensitivity on bilateral arms and face with recent yard work. She uses SPF 50.     PAROTIDS: Notes intermittent swelling however swelling is less pronounced, less painful, and lasts for a shorter duration at a time. Symptoms flared in February for uncertain provocation however have subsequently improved.    Social: Working outside as much as possible for stress relief.     Further ROS as below.    Disease History:  -- 2005 presented with bilateral partoid swelling, sicca symptoms, & history of pancytopenia. At time of presentation she was hospitalized with a small pericardial effusion that resolved on monitoring.  Evaluation at that time was concenring for +ANA 1:320 speckled, +SSA, +SSB, +RF 120, -dsDNA. Initiated HCQ.  --  2007 - 2010 continued PRN prednisone for parotid swelling. Concurrent intermittent antibiotics for concern for concurrent infections. Evaluated in ENT.  -- 2009 trial of MTX for degree of parotid swelling. Hospitalized for abdominal pain & elevated LFTs. MTX discontinued. After discontinuation of MTX developed worsening joint pain & swelling of hands.   -- 2011 developed ACLE/malar rash after patient had discontinued HCQ. At that time labs were concerning for +ANA 1:640 speckled, -dsDNA. Restarted HCQ.  -- 2012 concern for worsening arthritis pain. Initiated azathioprine while continued HCQ.  -- 2013 patient self-discontinued medications for nausea.  -- 2014 re-presented with malar & anterior throat rash for which HCQ was restarted.  -- 2015-2017 lost to follow-up. Restarted HCQ. Concern for elevated LFTs for which she was evaluated by GI & underwent liver biopsy which confirmed NAFLD/fibrosis.  -- 2018 - 2019 continued on HCQ. Concern for active synovitis on exam promtping steroid taper.   -- 2020 belimumab prescribed for persistent arthralgias, new rash. Underwent bilateral sialendoscopies which improved her salivary movement.   ??  Treatment:  -- Belimumab [2020 - current]  -- HCQ [2005 - current; intermittent lapses in medication use - current]  -- MTX [2009] - discontinued 2/2 severe GI side effects  -- Azathioprine [2012 - 2013]  -- Multiple prednisone tapers    Labs:  -- As above [not all available dating back to 2005]  -- 2019: +SSA/SSB, +RF 30, -CCP    Imaging:  -- 2019: Xray hands: Nondisplaced fracture of the distal tuft of the left first distal phalanx. No evidence of erosive or inflammatory arthropathy.  -- 2019: Xray L foot: No acute fracture or malalignment. Normal soft tissues.     Review of Systems   Constitutional: Negative for chills and fever.   Eyes:        Notes dryness sensation attributed to allergies; using topical gtts   Respiratory: Negative for cough and shortness of breath.    Cardiovascular: Negative for chest pain.   Musculoskeletal: Positive for arthralgias (as above).   Skin: Positive for rash (with sun exposure).     PMH, PFH, PSH as previously documented.  Medications were reviewed this visit.    Objective:     Vitals:    09/03/19 1045   BP: 107/73   BP Site: L Arm   BP Position: Sitting   BP Cuff Size: Medium Pulse: 62   Temp: (!) 33 ??C   TempSrc: Temporal   Weight: 91.4 kg (201 lb 9.6 oz)   Height: 154.9 cm (5' 1)     Body mass index is 38.09 kg/m??.    Physical Exam  -- GENERAL: sitting up; no acute distress; appears comfortable  -- HEAD: atraumatic  -- EYES: normal sclera; conjunctivae are clear  -- ENT: MMM; no mucosal or nasal lesions; no congestion or rhinorrhea; no posterior pharynx erythema; mild TTP bilateral parotid glands without palpable fullness - more chronic per patient  -- NECK: supple; no palpable lymphadenopathy  -- CARDIO: RRR; no g/r/m  -- PULM: clear to auscultation bilaterally without crackles, wheezing, or coarse breath sounds  -- SKIN: mild nonraised blanching erythematous changes across bilateral proximal arms and mild blanching erythema across face with possible malar distribution --> patient reports reports related to sun exposure and symptoms have have actually improved over the last few days; normal scalp  -- NEURO: alert; oriented; moving all extremities    MSK:      ROM: full ROM bilateral shoulders, elbows, wrists, UE digits, knees, ankles  Mild TTP R 2nd/3rd MCPs without swelling or ROM limitations      No TTP, swelling, or erythema of bilateral shoulders, elbows, wrists, other UE digits, knees, ankles

## 2019-09-02 NOTE — Unmapped (Addendum)
1) Practice distraction when stress are elevated, daily  (100%)    Read   To be reviewed at next appointment     2) Choose fruits and vegetables - as snacks- daily (100%)    Purchase sweets in single serving containers    To be reviewed at next appointment   Add a cheese stick or nuts to reduce hunger at lunch     1) Eliminate diet soda (75%)    Have water and lemon instead    Unsweet tea with lemon       New Goal    1) Choose one fun food once a week.    Focus on portion

## 2019-09-03 ENCOUNTER — Ambulatory Visit
Admit: 2019-09-03 | Discharge: 2019-09-03 | Payer: PRIVATE HEALTH INSURANCE | Attending: Internal Medicine | Primary: Internal Medicine

## 2019-09-03 DIAGNOSIS — M329 Systemic lupus erythematosus, unspecified: Principal | ICD-10-CM

## 2019-09-03 DIAGNOSIS — K111 Hypertrophy of salivary gland: Principal | ICD-10-CM

## 2019-09-03 DIAGNOSIS — Z0000 Encounter for general adult medical examination without abnormal findings: Principal | ICD-10-CM

## 2019-09-03 LAB — CBC W/ AUTO DIFF
BASOPHILS ABSOLUTE COUNT: 0 10*9/L (ref 0.0–0.1)
BASOPHILS RELATIVE PERCENT: 0.3 %
EOSINOPHILS RELATIVE PERCENT: 1.4 %
HEMOGLOBIN: 14.2 g/dL (ref 12.0–15.5)
LYMPHOCYTES ABSOLUTE COUNT: 0.6 10*9/L — ABNORMAL LOW (ref 0.7–4.0)
LYMPHOCYTES RELATIVE PERCENT: 19.8 %
MEAN CORPUSCULAR HEMOGLOBIN CONC: 32.8 g/dL (ref 30.0–36.0)
MEAN CORPUSCULAR HEMOGLOBIN: 29.8 pg (ref 26.0–34.0)
MEAN PLATELET VOLUME: 10.1 fL — ABNORMAL HIGH (ref 7.0–10.0)
MONOCYTES ABSOLUTE COUNT: 0.3 10*9/L (ref 0.1–1.0)
MONOCYTES RELATIVE PERCENT: 11.4 %
NEUTROPHILS ABSOLUTE COUNT: 2 10*9/L (ref 1.7–7.7)
NEUTROPHILS RELATIVE PERCENT: 67.1 %
NUCLEATED RED BLOOD CELLS: 0 /100{WBCs} (ref <=4)
PLATELET COUNT: 164 10*9/L (ref 150–450)
RED BLOOD CELL COUNT: 4.76 10*12/L (ref 3.90–5.03)
RED CELL DISTRIBUTION WIDTH: 13.1 % (ref 12.0–15.0)

## 2019-09-03 LAB — COMPREHENSIVE METABOLIC PANEL
ALBUMIN: 4 g/dL (ref 3.4–5.0)
ALKALINE PHOSPHATASE: 51 U/L (ref 46–116)
ALT (SGPT): 29 U/L (ref 10–49)
ANION GAP: 4 mmol/L (ref 3–11)
AST (SGOT): 23 U/L (ref <34)
BILIRUBIN TOTAL: 0.5 mg/dL (ref 0.3–1.2)
BLOOD UREA NITROGEN: 9 mg/dL (ref 9–23)
CALCIUM: 9.7 mg/dL (ref 8.7–10.4)
CHLORIDE: 107 mmol/L (ref 98–107)
CO2: 29.4 mmol/L (ref 20.0–31.0)
CREATININE: 0.77 mg/dL (ref 0.60–1.10)
EGFR CKD-EPI AA FEMALE: 110 mL/min/{1.73_m2}
EGFR CKD-EPI NON-AA FEMALE: 96 mL/min/{1.73_m2}
GLUCOSE RANDOM: 102 mg/dL (ref 70–179)
POTASSIUM: 4 mmol/L (ref 3.5–5.1)
PROTEIN TOTAL: 7.4 g/dL (ref 5.7–8.2)
SODIUM: 140 mmol/L (ref 135–145)

## 2019-09-03 LAB — URINALYSIS
BILIRUBIN UA: NEGATIVE
BLOOD UA: NEGATIVE
GLUCOSE UA: NEGATIVE
KETONES UA: NEGATIVE
LEUKOCYTE ESTERASE UA: NEGATIVE
NITRITE UA: NEGATIVE
PH UA: 5.5 (ref 5.0–9.0)
PROTEIN UA: NEGATIVE
SPECIFIC GRAVITY UA: >=1.03 (ref 1.005–1.030)
SQUAMOUS EPITHELIAL: 2 /HPF (ref 0–5)
UROBILINOGEN UA: 0.2
WBC UA: 5 /HPF (ref 0–5)

## 2019-09-03 LAB — PROTEIN / CREATININE RATIO, URINE
CREATININE, URINE: 178.6 mg/dL
PROTEIN URINE: 5.9 mg/dL
PROTEIN/CREAT RATIO, URINE: 0.033

## 2019-09-03 LAB — NEUTROPHILS RELATIVE PERCENT: Neutrophils/100 leukocytes:NFr:Pt:Bld:Qn:Automated count: 67.1

## 2019-09-03 LAB — PROTEIN/CREAT RATIO, URINE: Protein/Creatinine:MRto:Pt:Urine:Qn:: 0.033

## 2019-09-03 LAB — C4 COMPLEMENT: Complement C4:MCnc:Pt:Ser/Plas:Qn:: 28.7

## 2019-09-03 LAB — ANION GAP: Anion gap 3:SCnc:Pt:Ser/Plas:Qn:: 4

## 2019-09-03 LAB — C3 COMPLEMENT: Complement C3:MCnc:Pt:Ser/Plas:Qn:: 95

## 2019-09-03 LAB — SQUAMOUS EPITHELIAL: Lab: 2

## 2019-09-03 MED ORDER — DICLOFENAC 1 % TOPICAL GEL
Freq: Three times a day (TID) | TOPICAL | 0 refills | 17 days | Status: CP | PRN
Start: 2019-09-03 — End: 2020-09-02

## 2019-09-03 MED ORDER — HYDROXYCHLOROQUINE 200 MG TABLET
ORAL_TABLET | Freq: Two times a day (BID) | ORAL | 3 refills | 90.00000 days | Status: CP
Start: 2019-09-03 — End: 2019-12-02

## 2019-09-03 NOTE — Unmapped (Signed)
Thank you for coming to your appointment.    I will call you with your lab results.    You can try to topical diclofenac [I sent this to your pharmacy to see if we can get it cheaper].

## 2019-09-04 LAB — ANTI-DNA ANTIBODY, DOUBLE-STRANDED: DSDNA ANTIBODY: NEGATIVE

## 2019-09-04 LAB — DSDNA ANTIBODY: Lab: NEGATIVE

## 2019-09-05 DIAGNOSIS — K219 Gastro-esophageal reflux disease without esophagitis: Principal | ICD-10-CM

## 2019-09-05 LAB — SERUM PROTEIN ELECTROPHORESIS AND IMMUNOFIXATION
ALBUMIN (SPE): 4.1 g/dL (ref 3.5–5.0)
ALPHA-2 GLOBULIN: 0.8 g/dL (ref 0.5–1.1)
BETA-1 GLOBULIN: 0.5 g/dL (ref 0.3–0.6)
BETA-2 GLOBULIN: 0.4 g/dL (ref 0.2–0.6)
GAMMAGLOBULIN: 1.3 g/dL (ref 0.5–1.5)
PROTEIN TOTAL: 7.3 g/dL (ref 6.5–8.3)

## 2019-09-05 LAB — BETA-1 GLOBULIN: Lab: 0.5

## 2019-09-10 MED ORDER — BUPROPION HCL XL 150 MG 24 HR TABLET, EXTENDED RELEASE
ORAL_TABLET | 2 refills | 0 days | Status: CP
Start: 2019-09-10 — End: ?

## 2019-09-16 NOTE — Unmapped (Signed)
[facility] Follow Up    TeleHealth Video Encounter (Doximity)  This medical encounter was conducted virtually using [email or web address]  TeleHealth protocols.    I have identified myself to the patient and conveyed my credentials to Tonya Brady  In case we get disconnected, patient's phone number is [phone] (home)    Patient has signed informed consent on file in medical record.  Is there someone else in the room? No.     Assessment/Plan:     No chief complaint on file.      Problem List Items Addressed This Visit        Other    Class 2 obesity - Primary     Tonya Brady is a 44 y.o. 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 Pt wishes weight loss to prevent further liver damage.    Weight Summary:  1. Starting weight/BMI: 209 lb, 5' 1 -- BMI 39.6 (06/13/19 self reported)  2. Target weight/goal: 160 lb.  3. Today's weight/BMI: 193 lb, BMI 36.4 (09/17/19 self reported)  4. % Body weight loss: 7.7%  5. Waist Circumference: N/A    Referred by: [name], MD in [facility].    GOALS: Continue working on reducing soda. Gym workout regularly with precautions.    I have reviewed the patient's medical history, lifestyle history and labs/tests.   My recommendations include the following:    Diet quality and patterning: Soda cut down to diet drink twice a week instead of 1 regular soda daily. Endorses healthy home cooked meals.  -- Will meet regularly with our nutritionist during the duration of medical management treatment. Discussed about healthier food choices and advised to home cook for less processed meals.    Exercise: Gym workout (both cardio and strength training) 3 times a week. 2-2.5 hr each.   -- Goal of 30 minutes of exercise 5x a week or a total of 150 minutes weekly.    Sleep: Currently sleeping 6 hours without sleep aids. +OSA screening (STOP-BANG 3/8). Pt declined to do sleep study for now and wishes to focus on lifestyle changes.  -- Recommend 7-8 hours of sleep at night. Advised to try melatonin 10 mg for insomnia.     Stress management: Current stressors: domestic, health, world news, finance. She has an established therapist.  -- Recommend to try relaxation techniques such as breathing techniques, listening to music to help sleep at night and yoga stretching throughout the day to help control stress.     Weight gain causing medication: None.   Hx of long term steroid use (prednisone 80 mg daily for lupus).  Hx of Ambien for insomnia years ago.    Medication: Taking medications as instructed. No s/e. She is responding very well and losing 2 lb/wk along with significant lifestyle changes. Continue current regimen.  CONTINUE wellbutrin 150 mg daily. Naltrexone 25 mg BID.  - Wellbutrin: pt was started on this for depression before enrolling at our office. ?weight loss on it.  - Naltrexone: started on 06/13/19 at 209 lb.  Tried / Contraindicated  - Orlistat: tried and failed.  - Topamax: contraindicated dhx of kidney stone.  - Phentermine: contraindicated by hx of palpitations.  - GLP1: will avoid due to strong family hx of thyroid cancer.    Obesity Surgery: Pt is interested in this option. However pt's BMI is 36  and currently losing 2 lb/wk with good response to medication and healthy lifestyle changes so we will revisit this option if pt gets on the plateau or gains weight above the BMI 35. (updated 09/17/19)    Medical conditions:  Glaucoma: no  Seizures: no  Medullary thyroid cancer (personal or family hx): Father and PGM both had thyroid cancer and  thyroidectomy but they are both deceased so pt couldn't tell what type. Will avoid GLP-1 agonists.  Multiple Endocrine Neoplasia: no  Palpitations/Tachycardia: YES  Chest Pain: no past history of MI.   Headaches/Migraines: YES  Nephrolithiasis: YES  H/o pancreatitis: YES, gallstone pancreatitis and she had cholecystectomy.  GERD: YES    Prior Surgeries:  Choleycystectomy: YES  Hysterectomy: YES    Obesity-associated comorbidities: Cirrhosis, GERD, suspected sleep apnea.  Current barriers include: Anxiety, depression, Sjogren's, lupus, emotional /stress eating, insomnia, fibromyalgia.  Birth Control Methods: N/A; s/p hysterectomy.                Return in about 6 weeks (around 10/29/2019) for Weight clinic F/U one slot.wel naltrexone, ellie same day. .        I spent 21 minutes on the real-time audio and video with the patient on the date of service. I spent an additional 10 minutes on pre- and post-visit activities on the date of service.     The patient was physically located in [place] or a state in which I am permitted to provide care. The patient and/or parent/guardian understood that s/he may incur co-pays and cost sharing, and agreed to the telemedicine visit. The visit was reasonable and appropriate under the circumstances given the patient's presentation at the time.    The patient and/or parent/guardian has been advised of the potential risks and limitations of this mode of treatment (including, but not limited to, the absence of in-person examination) and has agreed to be treated using telemedicine. The patient's/patient's family's questions regarding telemedicine have been answered.     If the visit was completed in an ambulatory setting, the patient and/or parent/guardian has also been advised to contact their provider???s office for worsening conditions, and seek emergency medical treatment and/or call 911 if the patient deems either necessary.         HPI:     Tonya Brady is a 44 y.o. female who presents  for a video visit for [facility] follow up amidst COVID-19 outbreak.     Weight Management History    Wt Readings from Last 6 Encounters:   09/17/19 87.5 kg (193 lb)   09/03/19 91.4 kg (201 lb 9.6 oz)   09/02/19 90.7 kg (200 lb)   08/06/19 93 kg (205 lb)   07/25/19 93 kg (205 lb 1.9 oz)   07/10/19 94.9 kg (209 lb 1.8 oz)     Update: 12 lb loss in 6 weeks!  Medication: wellbutrin 150 mg daily and naltrexone 25 mg bid daily. Denies any s/e.   Diet: Diet coke twice a week instead of regular sodas. Eating out is very rare now.  Exercise: walks an hour and goes to the gym 3x/wk. 2-2.5 hr each session.   Barrier: Nothing specific.    Lab Results   Component Value Date    LDL Calculated 92 02/13/2019    LDL Cholesterol, Calculated 104 06/23/2011    Hemoglobin A1C 5.4 02/13/2019    Hemoglobin A1C 5.9 02/10/2016    Hemoglobin A1C 5.5 06/23/2011    TSH 2.500 11/10/2015  TSH 1.92 06/23/2011       Past Medical/Surgical History:     Past Medical History:   Diagnosis Date   ??? Bulging lumbar disc 2005   ??? Cirrhosis of liver (CMS-HCC)    ??? Difficult intravenous access    ??? Fibromyalgia 03/09/2013   ??? NASH (nonalcoholic steatohepatitis)    ??? Secondary Sjogren's syndrome (CMS-HCC) 03/09/2013    Secondary to SLE  Diagnosed: 12/2003 (by sx's, parotid gland enlargement, and serologies) Pathology: biopsy not done Labs: + ANA, anti-SSA, anti-SSB, RF    ??? Systemic lupus erythematosus (CMS-HCC) 01/19/2011   ??? Valvular regurgitation      Past Surgical History:   Procedure Laterality Date   ??? CARPAL TUNNEL RELEASE     ??? CESAREAN SECTION     ??? CHOLECYSTECTOMY     ??? HERNIA REPAIR     ??? PR REVISE MEDIAN N/CARPAL TUNNEL SURG Left 12/30/2018    Procedure: R16 NEUROPLASTY AND/OR TRANSPOSITION; MEDIAN NERVE AT CARPAL TUNNEL;  Surgeon: Theodora Blow Jacqlyn Krauss, MD;  [facility]: [facility];  Service: Orthopedics   ??? PR SALIVARY SURG UNLISTED PROC Right 12/06/2018    Procedure: R22 UNLISTED PROC SALIVARY GLANDS/DUCTS;  Surgeon: Michelle Piper, MD;  Location: [facility];  Service: ENT   ??? PR SALIVARY SURG UNLISTED PROC Left 02/18/2019    Procedure: R25 UNLISTED PROC SALIVARY GLANDS/DUCTS;  Surgeon: Michelle Piper, MD;  Location: [facility] OR [facility];  Service: ENT   ??? PR UPPER GI ENDOSCOPY,DIAGNOSIS N/A 05/10/2016    Procedure: UGI ENDO, INCLUDE ESOPHAGUS, STOMACH, & DUODENUM &/OR JEJUNUM; DX W/WO COLLECTION SPECIMN, BY BRUSH OR WASH;  Surgeon: Carmon Ginsberg, MD;  Location: GI [facility]URES [facility];  Service: Gastroenterology   ??? PR UPPER GI ENDOSCOPY,DIAGNOSIS N/A 05/02/2019    Procedure: UGI ENDO, INCLUDE ESOPHAGUS, STOMACH, & DUODENUM &/OR JEJUNUM; DX W/WO COLLECTION SPECIMN, BY BRUSH OR WASH;  Surgeon: Chriss Driver, MD;  Location: GI [facility]URES [facility];  Service: Gastroenterology   ??? TOTAL ABDOMINAL HYSTERECTOMY     ??? TUBAL LIGATION         Social History:     Social History     Socioeconomic History   ??? Marital status: Married     Spouse name: None   ??? Number of children: None   ??? Years of education: None   ??? Highest education level: None   Occupational History   ??? None   Tobacco Use   ??? Smoking status: Never Smoker   ??? Smokeless tobacco: Never Used   Substance and Sexual Activity   ??? Alcohol use: No     Alcohol/week: 0.0 standard drinks   ??? Drug use: No   ??? Sexual activity: Yes     Partners: Male   Other Topics Concern   ??? Do you use sunscreen? Yes   ??? Tanning bed use? No   ??? Are you easily burned? No   ??? Excessive sun exposure? No   ??? Blistering sunburns? No   Social History Narrative   ??? None     Social Determinants of Health     Financial Resource Strain:    ??? Difficulty of Paying Living Expenses:    Food Insecurity:    ??? Worried About Programme researcher, broadcasting/film/video in the Last Year:    ??? Barista in the Last Year:    Transportation Needs:    ??? Freight forwarder (Medical):    ??? Lack of Transportation (Non-Medical):  Physical Activity:    ??? Days of Exercise per Week:    ??? Minutes of Exercise per Session:    Stress:    ??? Feeling of Stress :    Social Connections:    ??? Frequency of Communication with Friends and Family:    ??? Frequency of Social Gatherings with Friends and Family:    ??? Attends Religious Services:    ??? Database administrator or Organizations: ??? Attends Banker Meetings:    ??? Marital Status:        Family History:     Family History   Problem Relation Age of Onset   ??? Cancer Mother    ??? Hypertension Mother    ??? Heart disease Father    ??? Depression Son    ??? Mental illness Son    ??? Aneurysm Maternal Aunt    ??? Melanoma Neg Hx    ??? Basal cell carcinoma Neg Hx    ??? Squamous cell carcinoma Neg Hx    ??? Drug abuse Neg Hx    ??? Stroke Neg Hx        Allergies:     Aspirin, Fish containing products, Iodine, and Shellfish containing products    Current Medications:     Current Outpatient Medications   Medication Sig Dispense Refill   ??? albuterol HFA 90 mcg/actuation inhaler INHALE 2 PUFFS BY MOUTH EVERY 6 HOURS AS NEEDED FOR WHEEZING 6.7 g 0   ??? belimumab (BENLYSTA) 200 mg/mL AtIn Inject the contents of 1 syringe (200 mg) under the skin every seven (7) days. 4 mL 2   ??? buPROPion (WELLBUTRIN XL) 150 MG 24 hr tablet Take 1 tablet (150 mg total) by mouth every morning. 60 tablet 2   ??? diclofenac sodium (VOLTAREN) 1 % gel Apply 2 g topically Three (3) times a day as needed for arthritis or pain. 100 g 0   ??? famotidine (PEPCID) 40 MG tablet Take 1 tablet (40 mg total) by mouth nightly as needed for heartburn. 90 tablet 3   ??? hydrOXYchloroQUINE (PLAQUENIL) 200 mg tablet Take 1 tablet (200 mg total) by mouth Two (2) times a day. 180 tablet 3   ??? ibuprofen (MOTRIN) 800 MG tablet ibuprofen 800 mg tablet     ??? inhalational spacing device (AEROCHAMBER MV) Spcr 1 each by Miscellaneous route every six (6) hours as needed. 1 each 0   ??? naltrexone (DEPADE) 50 mg tablet Take 0.5 tablets (25 mg total) by mouth two (2) times a day. 60 tablet 2     No current facility-administered medications for this visit.       I have reviewed and (if needed) updated the patient's problem list, medications, allergies, past medical and surgical history, social and family history.    ROS:     Unless otherwise stated in the HPI:  CONSTITUTIONAL: no fever chills.   HEENT: Eyes: No diplopia or blurred vision. ENT: No earache, sore throat or runny nose.   CARDIOVASCULAR: No pressure, squeezing, strangling, tightness, heaviness or aching about the chest, neck, axilla or epigastrium.   RESPIRATORY: No cough, shortness of breath, PND or orthopnea.   GASTROINTESTINAL: No nausea, vomiting or diarrhea.   GENITOURINARY: No dysuria, frequency or urgency.   MUSCULOSKELETAL: no new pains, no joint swelling or redness.   SKIN: No change in skin, hair or nails.   NEUROLOGIC: No paresthesias, fasciculations, seizures or weakness.   PSYCHIATRIC: No disorder of thought or mood.   ENDOCRINE: No  heat or cold intolerance, polyuria or polydipsia.   HEMATOLOGICAL: No easy bruising or bleeding.    Vital Signs:     Self reported vitals: wt=193 lb.     Physical Exam:     As part of this Video Visit, no in-person exam was conducted.  Video interaction permitted the following observations.    GEN: No acute distress.   SKIN: Color is normal. No rashes.  PSYCH: Appropriate affect, normal mood.  RESP: Normal work of breathing, no respiratory distress.    Labs:     Office Visit on 09/03/2019   Component Date Value Ref Range Status   ??? C3 Complement 09/03/2019 95  88 - 171 mg/dL Final   ??? C4 Complement 09/03/2019 28.7  15.0 - 48.0 mg/dL Final   ??? Color, UA 16/02/9603 Yellow   Final   ??? Clarity, UA 09/03/2019 Hazy   Final   ??? Specific Gravity, UA 09/03/2019 >=1.030  1.005 - 1.030 Final   ??? pH, UA 09/03/2019 5.5  5.0 - 9.0 Final   ??? Leukocyte Esterase, UA 09/03/2019 Negative  Negative Final   ??? Nitrite, UA 09/03/2019 Negative  Negative Final   ??? Protein, UA 09/03/2019 Negative  Negative Final   ??? Glucose, UA 09/03/2019 Negative  Negative Final   ??? Ketones, UA 09/03/2019 Negative  Negative Final   ??? Urobilinogen, UA 09/03/2019 0.2 mg/dL  0.2 - 2.0 mg/dL Final   ??? Bilirubin, UA 09/03/2019 Negative  Negative Final   ??? Blood, UA 09/03/2019 Negative  Negative Final   ??? WBC, UA 09/03/2019 5  0 - 5 /HPF Final   ??? Squam Epithel, UA 09/03/2019 2  0 - 5 /HPF Final   ??? Bacteria, UA 09/03/2019 Many* None Seen /HPF Final   ??? Creat U 09/03/2019 178.6  Undefined mg/dL Final   ??? Protein, Ur 09/03/2019 5.9  Undefined mg/dL Final    This test was developed and its performance characteristics determined by the [facility] of the [employer] [employer]. This test has not been cleared or approved by the FDA. The laboratory is regulated under CAP and CLIA as qualified to perform high-complexity testing. This test is to be used for clinical purposes and should not be regarded as investigational or for research. Results should be interpreted in context with other laboratory and clinical data.   ??? Protein/Creatinine Ratio, Urine 09/03/2019 0.033  Undefined Final   ??? Sodium 09/03/2019 140  135 - 145 mmol/L Final   ??? Potassium 09/03/2019 4.0  3.5 - 5.1 mmol/L Final   ??? Chloride 09/03/2019 107  98 - 107 mmol/L Final   ??? Anion Gap 09/03/2019 4  3 - 11 mmol/L Final   ??? CO2 09/03/2019 29.4  20.0 - 31.0 mmol/L Final   ??? BUN 09/03/2019 9  9 - 23 mg/dL Final   ??? Creatinine 09/03/2019 0.77  0.60 - 1.10 mg/dL Final   ??? BUN/Creatinine Ratio 09/03/2019 12   Final   ??? EGFR CKD-EPI Non-African American,* 09/03/2019 96  mL/min/1.11m2 Final   ??? EGFR CKD-EPI African American, Fem* 09/03/2019 110  mL/min/1.82m2 Final   ??? Glucose 09/03/2019 102  70 - 179 mg/dL Final   ??? Calcium 54/01/8118 9.7  8.7 - 10.4 mg/dL Final   ??? Albumin 14/78/2956 4.0  3.4 - 5.0 g/dL Final   ??? Total Protein 09/03/2019 7.4  5.7 - 8.2 g/dL Final   ??? Total Bilirubin 09/03/2019 0.5  0.3 - 1.2 mg/dL Final   ??? AST 21/30/8657  23  <34 U/L Final   ??? ALT 09/03/2019 29  10 - 49 U/L Final   ??? Alkaline Phosphatase 09/03/2019 51  46 - 116 U/L Final   ??? dsDNA Ab 09/03/2019 Negative  Negative Final   ??? Total Protein 09/03/2019 7.3  6.5 - 8.3 g/dL Final   ??? T Albumin 29/56/2130 4.1  3.5 - 5.0 g/dL Final   ??? Alpha-1 Globulin 09/03/2019 0.2  0.2 - 0.5 g/dL Final   ??? Alpha-2 Globulin 09/03/2019 0.8  0.5 - 1.1 g/dL Final   ??? Beta-1 Globulin 09/03/2019 0.5  0.3 - 0.6 g/dL Final   ??? Beta-2 Globulin 09/03/2019 0.4  0.2 - 0.6 g/dL Final   ??? Gammaglobulin 09/03/2019 1.3  0.5 - 1.5 g/dL Final   ??? SPE Interpretation 09/03/2019    Final    The SPE pattern demonstrates an irregularity in the gamma region, which may represent monoclonal protein.     See Immunofixation report.     ??? Immunofixation Electrophoresis, Se* 09/03/2019    Final    No monoclonal protein detected.     ??? WBC 09/03/2019 3.0* 3.5 - 10.5 10*9/L Final   ??? RBC 09/03/2019 4.76  3.90 - 5.03 10*12/L Final   ??? HGB 09/03/2019 14.2  12.0 - 15.5 g/dL Final   ??? HCT 86/57/8469 43.2  35.0 - 44.0 % Final   ??? MCV 09/03/2019 90.9  82.0 - 98.0 fL Final   ??? MCH 09/03/2019 29.8  26.0 - 34.0 pg Final   ??? MCHC 09/03/2019 32.8  30.0 - 36.0 g/dL Final   ??? RDW 62/95/2841 13.1  12.0 - 15.0 % Final   ??? MPV 09/03/2019 10.1* 7.0 - 10.0 fL Final   ??? Platelet 09/03/2019 164  150 - 450 10*9/L Final   ??? nRBC 09/03/2019 0  <=4 /100 WBCs Final   ??? Neutrophils % 09/03/2019 67.1  % Final   ??? Lymphocytes % 09/03/2019 19.8  % Final   ??? Monocytes % 09/03/2019 11.4  % Final   ??? Eosinophils % 09/03/2019 1.4  % Final   ??? Basophils % 09/03/2019 0.3  % Final   ??? Absolute Neutrophils 09/03/2019 2.0  1.7 - 7.7 10*9/L Final   ??? Absolute Lymphocytes 09/03/2019 0.6* 0.7 - 4.0 10*9/L Final   ??? Absolute Monocytes 09/03/2019 0.3  0.1 - 1.0 10*9/L Final   ??? Absolute Eosinophils 09/03/2019 0.0  0.0 - 0.7 10*9/L Final   ??? Absolute Basophils 09/03/2019 0.0  0.0 - 0.1 10*9/L Final       Follow-up:     Recommend well adult check/physical in 1 year. Otherwise, follow up as below.  Return in about 6 weeks (around 10/29/2019) for Weight clinic F/U one slot.wel naltrexone, ellie same day. Marland Kitchen    Health maintenance reviewed and recommendations made based on [place] Preventative Task Force (USPTF) recommendations. Reviewed appropriate diet and exercise. Patient stated understanding and there were no barriers to learning.     I attest that I, Namwoo J Cho, personally documented this note while acting as scribe for [employer], MD.      Bo Mcclintock, Scribe.  09/17/2019     The documentation recorded by the scribe accurately reflects the service I personally performed and the decisions made by me.    Marshell Garfinkel, MD

## 2019-09-17 ENCOUNTER — Telehealth
Admit: 2019-09-17 | Discharge: 2019-09-18 | Payer: PRIVATE HEALTH INSURANCE | Attending: Family Medicine | Primary: Family Medicine

## 2019-09-17 DIAGNOSIS — E669 Obesity, unspecified: Principal | ICD-10-CM

## 2019-09-17 MED ORDER — NALTREXONE 50 MG TABLET
ORAL_TABLET | Freq: Two times a day (BID) | ORAL | 2 refills | 60.00000 days | Status: CP
Start: 2019-09-17 — End: ?

## 2019-09-17 MED ORDER — BUPROPION HCL XL 150 MG 24 HR TABLET, EXTENDED RELEASE
ORAL_TABLET | Freq: Every morning | ORAL | 2 refills | 60.00000 days | Status: CP
Start: 2019-09-17 — End: ?

## 2019-09-17 NOTE — Unmapped (Signed)
[facility] Refill Coordination Note    Specialty Medication(s) to be Shipped:   Inflammatory Disorders: Benlysta    Other medication(s) to be shipped: n/a     Tonya Brady, DOB: 10-20-1975  Phone: [phone] (home)       All above HIPAA information was verified with patient.     Was a Nurse, learning disability used for this call? No    Completed refill call assessment today to schedule patient's medication shipment from the [facility] [phone]).       Specialty medication(s) and dose(s) confirmed: Regimen is correct and unchanged.   Changes to medications: Doretha reports no changes at this time.  Changes to insurance: No  Questions for the pharmacist: No    Confirmed patient received Welcome Packet with first shipment. The patient will receive a drug information handout for each medication shipped and additional FDA Medication Guides as required.       DISEASE/MEDICATION-SPECIFIC INFORMATION        For patients on injectable medications: Patient currently has 1 doses left.  Next injection is scheduled for 4/29.    SPECIALTY MEDICATION ADHERENCE     Medication Adherence    Patient reported X missed doses in the last month: 0  Specialty Medication: Benlysta  Patient is on additional specialty medications: No  Patient is on more than two specialty medications: No  Any gaps in refill history greater than 2 weeks in the last 3 months: no  Demonstrates understanding of importance of adherence: yes  Informant: patient                Benlysta 200mg /ml: Patient has 7 days of medication on hand      SHIPPING     Shipping address confirmed in Epic.     Delivery Scheduled: Yes, Expected medication delivery date: 5/4.     Medication will be delivered via Same Day Courier to the prescription address in Epic WAM.    [name]   [facility] Specialty Technician

## 2019-09-17 NOTE — Unmapped (Addendum)
Tonya Brady is a 44 y.o. 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 Pt wishes weight loss to prevent further liver damage.    Weight Summary:  1. Starting weight/BMI: 209 lb, 5' 1 -- BMI 39.6 (06/13/19 self reported)  2. Target weight/goal: 160 lb.  3. Today's weight/BMI: 193 lb, BMI 36.4 (09/17/19 self reported)  4. % Body weight loss: 7.7%  5. Waist Circumference: N/A    Referred by: [name], MD in [facility] Medicine.    GOALS: Continue working on reducing soda. Gym workout regularly with precautions.    I have reviewed the patient's medical history, lifestyle history and labs/tests.   My recommendations include the following:    Diet quality and patterning: Soda cut down to diet drink twice a week instead of 1 regular soda daily. Endorses healthy home cooked meals.  -- Will meet regularly with our nutritionist during the duration of medical management treatment. Discussed about healthier food choices and advised to home cook for less processed meals.    Exercise: Gym workout (both cardio and strength training) 3 times a week. 2-2.5 hr each.   -- Goal of 30 minutes of exercise 5x a week or a total of 150 minutes weekly.    Sleep: Currently sleeping 6 hours without sleep aids. +OSA screening (STOP-BANG 3/8). Pt declined to do sleep study for now and wishes to focus on lifestyle changes.  -- Recommend 7-8 hours of sleep at night. Advised to try melatonin 10 mg for insomnia.     Stress management: Current stressors: domestic, health, world news, finance. She has an established therapist.  -- Recommend to try relaxation techniques such as breathing techniques, listening to music to help sleep at night and yoga stretching throughout the day to help control stress.     Weight gain causing medication: None.   Hx of long term steroid use (prednisone 80 mg daily for lupus).  Hx of Ambien for insomnia years ago.    Medication: Taking medications as instructed. No s/e. She is responding very well and losing 2 lb/wk along with significant lifestyle changes. Continue current regimen.  CONTINUE wellbutrin 150 mg daily. Naltrexone 25 mg BID.  - Wellbutrin: pt was started on this for depression before enrolling at our office. ?weight loss on it.  - Naltrexone: started on 06/13/19 at 209 lb.  Tried / Contraindicated  - Orlistat: tried and failed.  - Topamax: contraindicated dhx of kidney stone.  - Phentermine: contraindicated by hx of palpitations.  - GLP1: will avoid due to strong family hx of thyroid cancer.    Obesity Surgery: Pt is interested in this option. However pt's BMI is 36 and currently losing 2 lb/wk with good response to medication and healthy lifestyle changes so we will revisit this option if pt gets on the plateau or gains weight above the BMI 35. (updated 09/17/19)    Medical conditions:  Glaucoma: no  Seizures: no  Medullary thyroid cancer (personal or family hx): Father and PGM both had thyroid cancer and  thyroidectomy but they are both deceased so pt couldn't tell what type. Will avoid GLP-1 agonists.  Multiple Endocrine Neoplasia: no  Palpitations/Tachycardia: YES  Chest Pain: no past history of MI.   Headaches/Migraines: YES  Nephrolithiasis: YES  H/o pancreatitis: YES, gallstone pancreatitis and she had cholecystectomy.  GERD: YES    Prior Surgeries:  Choleycystectomy: YES  Hysterectomy: YES    Obesity-associated comorbidities: Cirrhosis, GERD, suspected sleep apnea.  Current barriers include: Anxiety, depression, Sjogren's, lupus, emotional /stress eating, insomnia, fibromyalgia.  Birth Control Methods: N/A; s/p hysterectomy.

## 2019-09-23 MED FILL — BENLYSTA 200 MG/ML SUBCUTANEOUS AUTO-INJECTOR: 28 days supply | Qty: 4 | Fill #1 | Status: AC

## 2019-09-23 MED FILL — BENLYSTA 200 MG/ML SUBCUTANEOUS AUTO-INJECTOR: SUBCUTANEOUS | 28 days supply | Qty: 4 | Fill #1

## 2019-09-25 ENCOUNTER — Ambulatory Visit
Admit: 2019-09-25 | Discharge: 2019-09-26 | Payer: PRIVATE HEALTH INSURANCE | Attending: Obstetrics & Gynecology | Primary: Obstetrics & Gynecology

## 2019-09-25 DIAGNOSIS — Z0000 Encounter for general adult medical examination without abnormal findings: Principal | ICD-10-CM

## 2019-09-25 DIAGNOSIS — N393 Stress incontinence (female) (male): Principal | ICD-10-CM

## 2019-09-25 DIAGNOSIS — Z803 Family history of malignant neoplasm of breast: Principal | ICD-10-CM

## 2019-09-25 NOTE — Unmapped (Signed)
[facility] Intensive Case Management  SUMMARY NOTE     Attempted to contact pt today at Home number to introduce Intensive Case Management services. Left message to return call.; 2nd attempt    Discuss at next visit: Introduction to ICM       Lesia Hausen - High Risk Care Coordinator   [name] - [facility] Clinical Services  [address] [place], [place] [address]  p. [phone]   [email or web address]

## 2019-09-25 NOTE — Unmapped (Signed)
SUBJECTIVE     PCP:  Alexandria Lodge, MD    HPI:  Tonya Brady is a 44 y.o. [phone].  No LMP recorded. Patient has had a hysterectomy.  She presents today to establish care and healthcare maintenance    GYNHx:   Menses:   Hysterectomy for benign disease   Last Pap:  Hysterectomy. History of 1 abnornal pap, no excisional or ablative procdure   Last MMG:    Never     HEALTH MAINTENANCE:     Health Maintenance Due   Topic Date Due   ??? COVID-19 Vaccine (1) Never done        Exercise:   walking   SBE:    yes    OB History   Gravida Para Term Preterm AB Living   5 4 4   1 4    SAB TAB Ectopic Molar Multiple Live Births   1         4      # Outcome Date GA Lbr Len/2nd Weight Sex Delivery Anes PTL Lv   5 Term 2007 [redacted]w[redacted]d  3884 g (8 lb 9 oz) M Vag-Spont  N LIV      Complications: Gestational diabetes   4 SAB 2006           3 Term 2001 [redacted]w[redacted]d  4309 g (9 lb 8 oz) M Vag-Spont   LIV      Complications: Gestational diabetes   2 Term 1997 [redacted]w[redacted]d  4309 g (9 lb 8 oz) M Vag-Spont  N LIV      Complications: Abruptio Placenta   1 Term 1995 [redacted]w[redacted]d  3345 g (7 lb 6 oz) F CS-LTranv   LIV      Complications: Chorioamnionitis       Past Medical History:   Diagnosis Date   ??? Bulging lumbar disc 2005   ??? Cirrhosis of liver (CMS-HCC)    ??? Depression    ??? Diabetes mellitus (CMS-HCC)    ??? Difficult intravenous access    ??? Fibromyalgia 03/09/2013   ??? NASH (nonalcoholic steatohepatitis)    ??? Secondary Sjogren's syndrome (CMS-HCC) 03/09/2013    Secondary to SLE  Diagnosed: 12/2003 (by sx's, parotid gland enlargement, and serologies) Pathology: biopsy not done Labs: + ANA, anti-SSA, anti-SSB, RF    ??? Systemic lupus erythematosus (CMS-HCC) 01/19/2011   ??? Valvular regurgitation        Past Surgical History:   Procedure Laterality Date   ??? CARPAL TUNNEL RELEASE     ??? CESAREAN SECTION     ??? CHOLECYSTECTOMY     ??? HERNIA REPAIR     ??? PR REVISE MEDIAN N/CARPAL TUNNEL SURG Left 12/30/2018    Procedure: R16 NEUROPLASTY AND/OR TRANSPOSITION; MEDIAN NERVE AT CARPAL TUNNEL;  Surgeon: Theodora Blow Jacqlyn Krauss, MD;  Location: ASC [facility];  Service: Orthopedics   ??? PR SALIVARY SURG UNLISTED PROC Right 12/06/2018    Procedure: R22 UNLISTED PROC SALIVARY GLANDS/DUCTS;  Surgeon: Michelle Piper, MD;  Location: ASC OR [facility];  Service: ENT   ??? PR SALIVARY SURG UNLISTED PROC Left 02/18/2019    Procedure: R25 UNLISTED PROC SALIVARY GLANDS/DUCTS;  Surgeon: Michelle Piper, MD;  Location: ASC OR [facility];  Service: ENT   ??? PR UPPER GI ENDOSCOPY,DIAGNOSIS N/A 05/10/2016    Procedure: UGI ENDO, INCLUDE ESOPHAGUS, STOMACH, & DUODENUM &/OR JEJUNUM; DX W/WO COLLECTION SPECIMN, BY BRUSH OR WASH;  Surgeon: Carmon Ginsberg, MD;  [facility] GI [facility];  Service: Gastroenterology   ???  PR UPPER GI ENDOSCOPY,DIAGNOSIS N/A 05/02/2019    Procedure: UGI ENDO, INCLUDE ESOPHAGUS, STOMACH, & DUODENUM &/OR JEJUNUM; DX W/WO COLLECTION SPECIMN, BY BRUSH OR WASH;  Surgeon: Chriss Driver, MD;  Location: GI [facility];  Service: Gastroenterology   ??? TOTAL ABDOMINAL HYSTERECTOMY     ??? TUBAL LIGATION         SOCIAL HISTORY:    Social History     Tobacco Use   Smoking Status Never Smoker   Smokeless Tobacco Never Used     Social History     Substance and Sexual Activity   Alcohol Use No   ??? Alcohol/week: 0.0 standard drinks       Social History     Substance and Sexual Activity   Drug Use No       Family History   Problem Relation Age of Onset   ??? Cancer Mother    ??? Hypertension Mother    ??? Heart disease Father    ??? Heart attack Father    ??? Depression Son    ??? Mental illness Son    ??? Aneurysm Maternal Aunt    ??? Diabetes Maternal Aunt    ??? Diabetes Maternal Grandmother    ??? Hypertension Maternal Grandmother    ??? Thyroid disease Maternal Grandmother    ??? Breast cancer Paternal Grandmother    ??? Diabetes Paternal Grandmother    ??? Thyroid disease Paternal Grandmother    ??? Diabetes Maternal Uncle    ??? Heart attack Maternal Uncle    ??? Melanoma Neg Hx ??? Basal cell carcinoma Neg Hx    ??? Squamous cell carcinoma Neg Hx    ??? Drug abuse Neg Hx    ??? Stroke Neg Hx    Breast cancer in paternal grandmother and her 5 sisters. Pt has consulted cancer genetics in the past    ALLERGIES:    Aspirin, Fish containing products, Iodine, and Shellfish containing products    Current Outpatient Medications   Medication Sig Dispense Refill   ??? albuterol HFA 90 mcg/actuation inhaler INHALE 2 PUFFS BY MOUTH EVERY 6 HOURS AS NEEDED FOR WHEEZING 6.7 g 0   ??? belimumab (BENLYSTA) 200 mg/mL AtIn Inject the contents of 1 syringe (200 mg) under the skin every seven (7) days. 4 mL 2   ??? buPROPion (WELLBUTRIN XL) 150 MG 24 hr tablet Take 1 tablet (150 mg total) by mouth every morning. 60 tablet 2   ??? diclofenac sodium (VOLTAREN) 1 % gel Apply 2 g topically Three (3) times a day as needed for arthritis or pain. 100 g 0   ??? famotidine (PEPCID) 40 MG tablet Take 1 tablet (40 mg total) by mouth nightly as needed for heartburn. 90 tablet 3   ??? hydrOXYchloroQUINE (PLAQUENIL) 200 mg tablet Take 1 tablet (200 mg total) by mouth Two (2) times a day. 180 tablet 3   ??? inhalational spacing device (AEROCHAMBER MV) Spcr 1 each by Miscellaneous route every six (6) hours as needed. 1 each 0   ??? melatonin 1 mg Tab tablet Take by mouth nightly.     ??? naltrexone (DEPADE) 50 mg tablet Take 0.5 tablets (25 mg total) by mouth two (2) times a day. 60 tablet 2   ??? ibuprofen (MOTRIN) 800 MG tablet ibuprofen 800 mg tablet       No current facility-administered medications for this visit.       Review of Systems    General:  none  Gyn:                            none  Skin:                            none  HEENT:                      none  Lymph:                        none  Breasts:                       none  Respiratory:                none  Cardiovascular:           none  GI:                               none      Urinary:                       Leakage of urine  Musculoskeletal:         none Endocrine:                   none  Psychological:             Depression and Anxiety    OBJECTIVE     PE:  BP 104/66  - Pulse 55  - Wt 91.1 kg (200 lb 14.4 oz)  - Breastfeeding No  - BMI 37.96 kg/m??   GEN:   WD, WN, NAD  HEENT:   No thyroidmegally, no nodules or tenderness.   CHEST:   Clear to ascultation bilaterally  CV:   Regular rate and rhythm without murmur, rub or gallop  BREAST:   No dominant mass.  No lymphadenopathy in axilla. No nipple discharge, retractions or dimpling  ABD:    NT, ND.  Soft, no masses.  No hepatosplenomegaly.  No hernias noted.  EXTR:  No clubbing, cyanosis or edema    PELVIC:    EG/BUS: Normal    Vagina:  Without lesions or abnormal discharge  Cervix:  Absent  Uterus:  absent  Adnexa:  No adnexal masses or tenderness.    Pelvic Floor: Non tender    ASSESSMENT AND PLAN     Healthcare maintenance  -     Mammo Screening Bilateral; Future    Family history of breast cancer: Discussed follow up with cancer genetics. SH declines at this time  -     Mammo Screening Bilateral; Future  - Discussed SBE and yearly CBE    Stress incontinence, female  -     Ambulatory referral to Urogynecology; Future

## 2019-09-25 NOTE — Unmapped (Signed)
After 12 pm/    [facility] INTENSIVE CASE MANAGEMENT   Brief Note    Patient was busy, and then has appointment at 945. Asked for a call today after lunch (12pm)    Plan:     1. Case Management to: 2nd attempt after 12pm    2. Patient/caregiver to: Contact PCP / Complex case management as needs arise.     Discuss at next outreach: Introduction to [facility]    Care Coordination Note updated in [facility]: Yes        Lesia Hausen - High Risk Care Coordinator   [facility] - [facility] Clinical Services  [address] [place], [place] [address]  p. [phone]   [email or web address] [email or web address]

## 2019-09-26 NOTE — Unmapped (Signed)
Error.      Lesia Hausen - High Risk Care Coordinator   [facility] - [facility] Clinical Services  [address], [address] [place], [place] [address]  p. [phone]   [email or web address]

## 2019-09-29 NOTE — Unmapped (Signed)
Nutrition Consult Note    Referring Provider:  Self, Referred    Reason for Referral:  Nutrition Counseling        Medical History:  Patient Active Problem List   Diagnosis   ??? Systemic lupus erythematosus (CMS-HCC)   ??? Allergic rhinitis   ??? Secondary Sjogren's syndrome (CMS-HCC)   ??? Fibromyalgia   ??? Restless leg syndrome   ??? Thrombocytopenia (CMS-HCC)   ??? Palpitations   ??? Morbid obesity due to excess calories (CMS-HCC)   ??? Chronic abdominal pain   ??? 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 Moderate episode of recurrent major depressive disorder (CMS-HCC)   ??? Gastroesophageal reflux disease   ??? GAD (generalized anxiety disorder)   ??? Acute left ankle pain   ??? Acute carpal tunnel syndrome of left wrist   ??? Annual physical exam   ??? Acquired trigger finger   ??? Swelling of right parotid gland   ??? Swelling of left parotid gland   ??? Acute cystitis without hematuria   ??? Class 2 obesity   ??? Insomnia   ??? COVID-19       Barriers to Care:  acuity of illness    - Self Reported   Present height 154.9 cm (5' 1)  Present weight 89.4 kg (197 lb)  197 lbs   Current BMI (!) 37.24        Weight Status Categories:  Underweight:  BMI < 18.5  Normal Weight:  BMI 18.5 - 24.9  Overweight:  BMI 25 - 29.9  Obesity Class I:  BMI 30 - 34.9  Obesity Class II:  BMI 35 - 39.9  Obesity Class III:  BMI ? 40    Weight History:  Wt Readings from Last 6 Encounters:   09/30/19 89.4 kg (197 lb)   09/25/19 91.1 kg (200 lb 14.4 oz)   09/17/19 87.5 kg (193 lb)   09/03/19 91.4 kg (201 lb 9.6 oz)   09/02/19 90.7 kg (200 lb)   08/06/19 93 kg (205 lb)       Allergies:   Allergies   Allergen Reactions   ??? Aspirin Swelling     Other reaction(s): SWELLING/EDEMA   ??? Fish Containing Products Diarrhea and Nausea And Vomiting   ??? Iodine Other (See Comments)     Pt told not to have d/t shellfish allergy   ??? Shellfish Containing Products Hives, Diarrhea and Nausea Only     Other reaction(s): HIVES  Other reaction(s): HIVES  Other reaction(s): NAUSEA       Relevant Medications, Herbs, Supplements:    Current Outpatient Medications:   ???  albuterol HFA 90 mcg/actuation inhaler, INHALE 2 PUFFS BY MOUTH EVERY 6 HOURS AS NEEDED FOR WHEEZING, Disp: 6.7 g, Rfl: 0  ???  belimumab (BENLYSTA) 200 mg/mL AtIn, Inject the contents of 1 syringe (200 mg) under the skin every seven (7) days., Disp: 4 mL, Rfl: 2  ???  buPROPion (WELLBUTRIN XL) 150 MG 24 hr tablet, Take 1 tablet (150 mg total) by mouth every morning., Disp: 60 tablet, Rfl: 2  ???  diclofenac sodium (VOLTAREN) 1 % gel, Apply 2 g topically Three (3) times a day as needed for arthritis or pain., Disp: 100 g, Rfl: 0  ???  famotidine (PEPCID) 40 MG tablet, Take 1 tablet (40 mg total) by mouth nightly as needed for heartburn., Disp: 90 tablet, Rfl: 3  ???  hydrOXYchloroQUINE (PLAQUENIL) 200 mg tablet, Take 1 tablet (200 mg total) by mouth Two (  2) times a day., Disp: 180 tablet, Rfl: 3  ???  ibuprofen (MOTRIN) 800 MG tablet, ibuprofen 800 mg tablet, Disp: , Rfl:   ???  inhalational spacing device (AEROCHAMBER MV) Spcr, 1 each by Miscellaneous route every six (6) hours as needed., Disp: 1 each, Rfl: 0  ???  melatonin 1 mg Tab tablet, Take by mouth nightly., Disp: , Rfl:   ???  naltrexone (DEPADE) 50 mg tablet, Take 0.5 tablets (25 mg total) by mouth two (2) times a day., Disp: 60 tablet, Rfl: 2    Do you have any concerns about taking or affording your medications?  None    Relevant Labs:  Hemoglobin A1C (%)   Date Value   02/13/2019 5.4   11/28/2017 5.2   10/10/2016 5.6   02/10/2016 5.9   06/23/2011 5.5      No components found for: LDLCALC   BP Readings from Last 3 Encounters:   09/25/19 104/66   09/03/19 107/73   07/25/19 118/70     Lab Results   Component Value Date    CHOL 148 02/13/2019    CHOL 160 11/28/2017    CHOL 165 06/23/2011     Lab Results   Component Value Date    HDL 37 (L) 02/13/2019    HDL 44 11/28/2017    HDL 35 (L) 06/23/2011     Lab Results   Component Value Date    LDL 92 02/13/2019    LDL 103 (H) 11/28/2017    LDL 104 06/23/2011     Lab Results   Component Value Date    VLDL 18.6 02/13/2019    VLDL 13.2 11/28/2017     Lab Results   Component Value Date    CHOLHDLRATIO 4.0 02/13/2019    CHOLHDLRATIO 3.6 11/28/2017     Lab Results   Component Value Date    TRIG 93 02/13/2019    TRIG 66 11/28/2017    TRIG 132 06/23/2011       No results found for: VITDTOTAL  Lab Results   Component Value Date    VITAMINB12 >1000 (H) 06/23/2011       Physical Activity:  More yard work  4 days a week walking for an hour and 45 minutes - reads a book when she walks  Going to the gym 3 times a week   - added in strength  -   24-Hour recall/usual intake:    Time Intake   Breakfast Slim Fast Shake (every morning)   Small cup of fruit   Small chocolate muffin     Slim Fast Shake      Snack (AM)    Lunch 5 chicken nuggets and 1/2 fries   -- salad and sandwich   -- bowl of soup   -- Raman     Yogurt     6 baked chicken nuggets   1/2 banana     Grilled Chicken Sandwich - lettuce tomato and mayo   Diet Coke    Salad  Sandwich things   Banana      Snack (PM)    Dinner     [employer]  Handful of nut mix      Grilled Pork Chips  Eggplant   Green Beans     Meat and Vegetable    Snack (HS)      Other Nutrition Information:  HISTORY  Notes that recent weight high was 212 lbs    Cirosis of the liver - 3 years ago  Sunday had pizza   Wearing a large in a top instead of an extra large  Pants are down to a size 14   Had a good mothers day in [place]     USUAL INTAKES two-three meals   Hardly ate for the weeks she had COVID  Has been switching out the starch for more vegetable     SNACKING Limited snacking at this time   Having fewer packaged snacks  1/2 banana   Nuts and fruit packets   Yogurt     GROCERY SHOPPING 1-2 times a week   -8-9 people in the house  -- has started keeping things out of the house including chips   -- keeping some things out of the house     COOKING   -- one time a day   -- notes that her daughter has been working on making healthy changes aslo    DINING OUT 1x per week   Pizza      BEVERAGES  1 diet coke a day  Water   -- notes she is only having diet coke some days  -- mostly seltzer and bottled water     INTOLERANCES Spaghetti sauce     SLEEP 6 is doing well     STRESS Very elevated   -- reports that the Wellbutrin is improving her stress       Patient arrives for assistance with weight loss.  Reviewed dietary recall and physical activity.  Set goals for next appointment. Discussed non scale victories.  Discussed afternoon snacking.  Notes that she often overeats in the evening.  Will try a snack in the evening.        Estimated Daily Needs:  1510 calories (Mifflin-St Jeor equation for BMI > 25 using actual body weight, adjusted for weight loss)  72 g protein      Nutrition Diagnosis:  Overweight/obesity related to larger portions, limited physical activity and consumption of unbalanced meals as evidenced by BMI >30, 24 hour recall and no physical activity. (improved)     Nutrition Intervention:  Nutrition Counseling    Materials Provided:  N/A    Patient Stated Nutrition Goals:  1) Practice distraction when stress are elevated, daily  (100%)    Read   To be reviewed at next appointment       2) Choose fruits and vegetables - as snacks- daily (100%)    Purchase sweets in single serving containers    To be reviewed at next appointment   Add a cheese stick or nuts to reduce hunger at lunch     3) Eliminate diet soda (75%)    Have water and lemon instead    Unsweet tea with lemon     New Goal     1) Have an afternoon snack before dinner (use snack handout)     Fruit and cheese    Fruit and nuts     2) Keep a food log    Write down what she is eating for next appointment         Goals Added to Visit Navigator?  Yes    Monitoring/Evaluation:  Progress towards goals:  baseline    Expected Compliance is:   Readiness for change action stage    Follow-up:  4 weeks     Length of visit was 40 minutes  3 unit(s) billed per insurance    Rita Ohara MS RD CDE         PCP: Jarvis Newcomer  Rachel Bo, MD  Address on File: [address], [place] [address], [facility]      Verified as Current Location: Yes  Extended Emergency Contact Information  Primary Emergency Contact: Minda Ditto [place]  Home Phone: [phone]  Relation: Spouse  Secondary Emergency Contact: Hatch,Bridgette   [place]  Home Phone: [phone]  Mobile Phone: [phone]  Relation: Daughter  Mother: Reggie Pile of [place]  Home Phone: [phone]     Verified Behavioral Health Emergency Contact: N/A    I spent 40 minutes on the phone with the patient. I spent an additional 10 minutes on pre- and post-visit activities.     The patient was physically located in [place] or a state in which I am permitted to provide care. The patient and/or parent/guardian understood that s/he may incur co-pays and cost sharing, and agreed to the telemedicine visit. The visit was reasonable and appropriate under the circumstances given the patient's presentation at the time.    The patient and/or parent/guardian has been advised of the potential risks and limitations of this mode of treatment (including, but not limited to, the absence of in-person examination) and has agreed to be treated using telemedicine. The patient's/patient's family's questions regarding telemedicine have been answered.     If the visit was completed in an ambulatory setting, the patient and/or parent/guardian has also been advised to contact their provider???s office for worsening conditions, and seek emergency medical treatment and/or call 911 if the patient deems either necessary.

## 2019-09-30 ENCOUNTER — Institutional Professional Consult (permissible substitution)
Admit: 2019-09-30 | Discharge: 2019-10-01 | Payer: PRIVATE HEALTH INSURANCE | Attending: Registered" | Primary: Registered"

## 2019-09-30 DIAGNOSIS — Z713 Dietary counseling and surveillance: Principal | ICD-10-CM

## 2019-09-30 DIAGNOSIS — E669 Obesity, unspecified: Principal | ICD-10-CM

## 2019-09-30 NOTE — Unmapped (Addendum)
1) Practice distraction when stress are elevated, daily  (100%)    Read   To be reviewed at next appointment       2) Choose fruits and vegetables - as snacks- daily (100%)    Purchase sweets in single serving containers    To be reviewed at next appointment   Add a cheese stick or nuts to reduce hunger at lunch     3) Eliminate diet soda (75%)    Have water and lemon instead    Unsweet tea with lemon     New Goal     1) Have an afternoon snack before dinner (use snack handout)     Fruit and cheese    Fruit and nuts     2) Keep a food log    Write down what she is eating for next appointment

## 2019-10-07 ENCOUNTER — Ambulatory Visit: Admit: 2019-10-07 | Discharge: 2019-10-08 | Payer: PRIVATE HEALTH INSURANCE

## 2019-10-09 NOTE — Unmapped (Signed)
[facility]  New Patient Evaluation & Consultation    Referring Provider: Estil Daft,*  PCP: Alexandria Lodge, MD  Date of Service: 10/10/2019    SUBJECTIVE  Chief Complaint: Establish Care    History of Present Illness: Tonya Brady is a 44 y.o. seen in consultation at the request of Dr. Erick Blinks for evaluation of stress incontinence.      Pt reports symptoms of urinary incontinence for the past 20 years. Most bothersome leakage occurs with jogging/running on the treadmill as well as with sneezing, coughing and laughing. She has had to decrease her exercising due to the leakage and has to wear pads/briefs when she is jogging. She also reports some leakage associated with urgency and leaking on the way to the bathroom - this is less frequent and less bothersome. She is s/p hysterectomy 12 yr ago for fibroids.  Pt is currently drinking flavored water, sparking water, occasional Diet Coke    Urinary Symptoms:  Stress urinary incontinence: yes, predominant  Urgency urinary incontinence: yes, less bothersome and less frequent  Leaks with walking, exercising, lifting, going from sitting to standing, with intercourse and with urgency  Leaks 5+ time(s) per day(s).   Pad use: 1-2 mini pads per day.  Uses larger pad/brief when exercising  She is bothered by her UI symptoms.    Day time voids 6.  Nocturia: 1-2 times per night to void.  Splinting to void: no   Voiding dysfunction: she sometimes does not empty her bladder well.     UTIs: 1 UTI's in the last year.    She reports remote history of kidney stones and pyelonephritis    Pelvic Organ Prolapse Symptoms:                   She denies a feeling of a bulge the vaginal area.   She denies seeing a bulge.     Bowel Symptom:  Bowel movements: 1 time(s) per day(s).    Stool consistency: varied hard to soft to loose.    She denies accidental bowel leakage / fecal incontinence  Straining: yes, sometimes  Splinting: no. Incomplete evacuation: no.   Fecal urgency: yes, sometimes     Past Medical History: Patient  has a past medical history of Bulging lumbar disc (2005), Cirrhosis of liver (CMS-HCC), Depression, Diabetes mellitus (CMS-HCC), Difficult intravenous access, Fibromyalgia (03/09/2013), NASH (nonalcoholic steatohepatitis), Secondary Sjogren's syndrome (CMS-HCC) (03/09/2013), Systemic lupus erythematosus (CMS-HCC) (01/19/2011), and Valvular regurgitation.     Past Surgical History: She  has a past surgical history that includes Total abdominal hysterectomy; Hernia repair; Cesarean section; Tubal ligation; Cholecystectomy; Carpal tunnel release; pr upper gi endoscopy,diagnosis (N/A, 05/10/2016); pr salivary surg unlisted proc (Right, 12/06/2018); pr revise median n/carpal tunnel surg (Left, 12/30/2018); pr salivary surg unlisted proc (Left, 02/18/2019); pr upper gi endoscopy,diagnosis (N/A, 05/02/2019); and Hysterectomy.     Past OB/GYN History:  G4 P4  Vaginal deliveries: 3  Cesarean section: 1    She is not menopausal.    She is not sexually active.   Last pap smear was 12 yr ago - negative.  Any history of abnormal pap smears: yes.  Last colonoscopy was: none.     Medications: She has a current medication list which includes the following prescription(s): benlysta, bupropion, famotidine, hydroxychloroquine, ibuprofen, loratadine, melatonin, naltrexone, albuterol, diclofenac sodium, and aerochamber mv.   Allergies: Patient is allergic to aspirin, fish containing products, iodine, and shellfish containing products.  Social History: Patient  reports that she has never smoked. She has never used smokeless tobacco. She reports that she does not drink alcohol and does not use drugs. She lives with husband, 2 sons, daughter, son-in-law, 2 grandkids.  She is employed in [employer].  Family History: family history includes Aneurysm in her maternal aunt; Breast cancer in her maternal grandmother and paternal grandmother; Cancer in her mother; Depression in her son; Diabetes in her maternal aunt, maternal grandmother, maternal uncle, and paternal grandmother; Heart attack in her father and maternal uncle; Heart disease in her father; Hypertension in her maternal grandmother and mother; Mental illness in her son; Thyroid disease in her maternal grandmother and paternal grandmother.     Review of Systems:  10 system review of systems was negative with positives as noted in the HPI.    OBJECTIVE  Physical Exam:  BP 121/61  - Pulse 63  - Ht 154.9 cm (5' 1)  - Wt 89.5 kg (197 lb 6.4 oz)  - BMI 37.30 kg/m??   Vitals:    10/10/19 1039 10/10/19 1047   BP:  121/61   Pulse:  63   Weight: 89.5 kg (197 lb 6.4 oz)    Height: 154.9 cm (5' 1)      Constitutional:  Well appearing female in no acute distress.   Psych:  Normal mood and affect.   Neuro: Alert and oriented x 3.  Respiratory:  Normal respiratory effort.   Cardiovascular:  No lower extremity edema.   Skin:  Warm and dry. No rashes/lesions visible.   Lymphatic:  No inguinal lymphadenopathy.   Gastrointestinal:  No abdominal tenderness.  No rebound/guarding.  Musculoskeletal: Ambulatory, moves all limbs without difficulty, extremities warm and well perfused      GU / Detailed Urogynecologic Evaluation:   Pelvic Exam: Normal external female genitalia; Bartholin's and Skene's glands normal in appearance; urethral meatus normal in appearance, no urethral masses or discharge.     s/p hysterectomy: Speculum exam reveals normal vaginal mucosa without atrophy and normal vaginal cuff.  Adnexa no mass, fullness, tenderness.      Pelvic floor strength II/V    POP-Q Exams 10/10/2019   gh 4   pb 3.5   tvl 8   aa -2   ba -2   ap -3   bp -3   c -7       Rectal Exam:   Normal external anus/rectum, no sign of dyssynergia when asking the patient to bear down.    Post-Void Residual (PVR) by Bladder Scan:  In order to evaluate bladder emptying, we discussed obtaining a postvoid residual and she agreed to this procedure.    Procedure: The ultrasound unit was placed on the patient???s abdomen in the suprapubic region after the patient had voided. A PVR of 8 ml was obtained by bladder scan.    Laboratory Results:  Results for orders placed or performed in visit on 10/10/19   POCT Urinalysis Dipstick   Result Value Ref Range    Spec Gravity/POC >=1.030 1.003 - 1.030    PH/POC 5.5 5.0 - 9.0    Leuk Esterase/POC Negative Negative    Nitrite/POC Negative Negative    Protein/POC Negative Negative    UA Glucose/POC Negative Negative    Ketones, POC Negative Negative    Bilirubin/POC Negative Negative    Blood/POC Negative Negative    Urobilinogen/POC 0.2 0.2 - 1.0 mg/dL        I visualized the urine  specimen, noting the specimen to be urine color: clear yellow    ASSESSMENT AND PLAN  Nelia Lourenco is a 44 y.o. with:   1. Mixed incontinence urge and stress    2. Prolapse of anterior vaginal wall        Stress-predominant mixed UI:  - We discussed the different forms of urinary incontinence, such as stress and urge incontinence, and described how her symptoms are consistent with mixed incontinence.   1. For treatment of stress incontinence, we discussed expectant management versus nonsurgical options versus surgery. Nonsurgical options include weight loss, Kegel exercises, with or without physical therapy, as well as an incontinence pessary.  Surgical options include a midurethral sling, a Burch urethropexy, and transurethral injection of a bulking agent.   - Pt possibly interested in surgical management in the future, but would like to try a pessary first. She will return for a pessary fitting.    2. For treatment of overactive bladder and urge incontinence, we discussed management including behavioral therapy (decreasing bladder irritants, urge suppression strategies, timed voids, bladder retraining), physical therapy, medication  - Discussed behavioral therapies (reduction of bladder irritants, urge suppression strategies and bladder retraining). Handouts given.    POP:  - Non-bothersome stage I anterior vaginal wall prolapse. Plan expectant management at this time.  - Pt will work on pelvic floor/Kegel exercises to help prevent the prolapse from worsening      Return for pessary fitting.        Medical Decision Making - Amount and Complexity of Data  1 point: I reviewed and/or ordered clinical laboratory tests.  2 points: I independently visualized the image, tracing or specimen .

## 2019-10-10 ENCOUNTER — Ambulatory Visit
Admit: 2019-10-10 | Discharge: 2019-10-11 | Payer: PRIVATE HEALTH INSURANCE | Attending: Women's Health | Primary: Women's Health

## 2019-10-10 DIAGNOSIS — N3946 Mixed incontinence: Principal | ICD-10-CM

## 2019-10-10 DIAGNOSIS — N8110 Cystocele, unspecified: Principal | ICD-10-CM

## 2019-10-10 NOTE — Unmapped (Signed)
Post-Void Residual (PVR) by Bladder Scan:  In order to evaluate bladder emptying, we discussed obtaining a postvoid residual and she agreed to this procedure.  Procedure: The ultrasound unit was placed on the patient???s abdomen in the suprapubic region after the patient had voided. A PVR of 8 ml was obtained by bladder scan.

## 2019-10-10 NOTE — Unmapped (Signed)
We discussed the two different types of urinary incontinence, such as stress and urge incontinence, and described how your symptoms are consistent with both of these, which is known as mixed incontinence.     1) For treatment of stress incontinence (leakage with physical activity/cough/sneeze), we discussed expectant management versus nonsurgical options versus surgery. Nonsurgical options include weight loss, Kegel exercises, with or without physical therapy, as well as an incontinence pessary.  Surgical options include a midurethral sling, a Burch urethropexy, and transurethral injection of a bulking agent.     2) For treatment of overactive bladder and urge incontinence (leakage with urge), we discussed management including behavioral therapy (decreasing bladder irritants, urge suppression strategies, timed voids, bladder retraining), physical therapy, medication.    - You will return for a pessary fitting    - See handouts on urge suppression strategies, reduction of bladder irritants and bladder retraining    If you have further questions, please call the Urogynecology Nurse Line at [phone] for [facility].  Please leave a message and someone will contact you within one business day.  For urgent issues that happen after 4:30PM and weekends that cannot wait until the next business day, call the [facility] operator at [phone] and have them page the [facility] resident on call. Please reserve this option for important issues. For emergencies go to your nearest Emergency Room or call 911.

## 2019-10-12 NOTE — Unmapped (Unsigned)
SUBJECTIVE     History of Present Illness:  Dailey Alberson Stierwalt is a 44 y.o. with stress-predominant mixed urinary incontinence seen in follow-up for a pessary fitting.     Plan at last visit 10/10/19 was:    Stress-predominant mixed UI:  - We discussed the different forms of urinary incontinence, such as stress and urge incontinence, and described how her symptoms are consistent with mixed incontinence.   1. For treatment of stress incontinence, we discussed expectant management versus nonsurgical options versus surgery. Nonsurgical options include weight loss, Kegel exercises, with or without physical therapy, as well as an incontinence pessary.  Surgical options include a midurethral sling, a Burch urethropexy, and transurethral injection of a bulking agent.   - Pt possibly interested in surgical management in the future, but would like to try a pessary first. She will return for a pessary fitting.  ??  2. For treatment of overactive bladder and urge incontinence, we discussed management including behavioral therapy (decreasing bladder irritants, urge suppression strategies, timed voids, bladder retraining), physical therapy, medication  - Discussed behavioral therapies (reduction of bladder irritants, urge suppression strategies and bladder retraining). Handouts given.  ??  POP:  - Non-bothersome stage I anterior vaginal wall prolapse. Plan expectant management at this time.  - Pt will work on pelvic floor/Kegel exercises to help prevent the prolapse from worsening  ??  Return for pessary fitting.    Today 10/13/19:    Past Medical History:  Patient  has a past medical history of Bulging lumbar disc (2005), Cirrhosis of liver (CMS-HCC), Depression, Diabetes mellitus (CMS-HCC), Difficult intravenous access, Fibromyalgia (03/09/2013), NASH (nonalcoholic steatohepatitis), Secondary Sjogren's syndrome (CMS-HCC) (03/09/2013), Systemic lupus erythematosus (CMS-HCC) (01/19/2011), and Valvular regurgitation.     Past Surgical History:  She  has a past surgical history that includes Total abdominal hysterectomy; Hernia repair; Cesarean section; Tubal ligation; Cholecystectomy; Carpal tunnel release; pr upper gi endoscopy,diagnosis (N/A, 05/10/2016); pr salivary surg unlisted proc (Right, 12/06/2018); pr revise median n/carpal tunnel surg (Left, 12/30/2018); pr salivary surg unlisted proc (Left, 02/18/2019); pr upper gi endoscopy,diagnosis (N/A, 05/02/2019); and Hysterectomy.     Medications:  She has a current medication list which includes the following prescription(s): albuterol, benlysta, bupropion, diclofenac sodium, famotidine, hydroxychloroquine, ibuprofen, aerochamber mv, loratadine, melatonin, and naltrexone.     Allergies:  Patient is allergic to aspirin, fish containing products, iodine, and shellfish containing products.     Social History:  Patient  reports that she has never smoked. She has never used smokeless tobacco. She reports that she does not drink alcohol and does not use drugs.        OBJECTIVE      Physical Exam:  There were no vitals taken for this visit.  There were no vitals filed for this visit.    Gen: No apparent distress, A&O x 3.  Pelvic Exam: Normal external female genitalia; Bartholin's and Skene's glands normal in appearance; urethral meatus {normal in appearance/with prolapse/with caruncle:25790}, no urethral masses or discharge. A size {size pessary:25515} {type:25791} pessary was fitted. It was comfortable, stayed in place with valsalva and was an appropriate size on examination, with one finger fitting between the pessary and the vaginal walls. We tied a string to it and the patient demonstrated proper removal and replacement.  Prior exam showed:  POP-Q Exams 10/10/2019   gh 4   pb 3.5   tvl 8   aa -2   ba -2   ap -3   bp -3   c -7  ASSESSMENT AND PLAN      Lajoya Holle is a 44 y.o. with:    No diagnosis found.    #2 incontinence ring - was uncomfortable and knob coming to introitus  #1 incontinence ring - expelled during valsalva  #1 incontinence dish - expelled during valsalva  #2 incontinence dish - stayed in place, but felt too big  #2 RWS    Plan:    #2 RWS             Time Spent:  The total time for the visit was *** minutes, of which greater than 50% was spent in face to face counseling with the patient.

## 2019-10-13 ENCOUNTER — Ambulatory Visit
Admit: 2019-10-13 | Discharge: 2019-10-14 | Payer: PRIVATE HEALTH INSURANCE | Attending: Women's Health | Primary: Women's Health

## 2019-10-13 NOTE — Unmapped (Signed)
PESSARY INSTRUCTIONS    ?? We fit you for a pessary today. We recommend you remove it every 1-3 days for cleaning and leave it out overnight.    ?? Pessary fittings can sometimes be a trial-and-error process and it can take multiple fittings to find the right one for you.  If your pessary comes out, you can leave it out and call to make an earlier visit, or if you learned how to replace it, you may try replacing it yourself.    ?? If you are unable to empty your bladder with the pessary in place, please remove your pessary.  If this type of pessary is not the kind that you can remove yourself, please call our office.  If it is after hours, proceed to an emergency room to have the pessary removed.    ?? If you are having significant pain, vaginal bleeding or other concerns regarding your pessary, please call the Urogynecology Triage Nurse Mon-Fri 8am to 4:30pm (please leave a voice mail if no one answers and someone will contact you within one business day).    ?? If needed, use Vit E or coconut oil at bedtime and/or with insertion/removal. This can help with lubrication, to prevent irritation from the pessary, and decrease discomfort with insertion/removal.     ?? Please return in 6-8 weeks for a pessary check.     If you have further questions, please call the Urogynecology Nurse Line at [phone] for [facility].  Please leave a message and someone will contact you within one business day.  For urgent issues that happen after 4:30PM and weekends that cannot wait until the next business day, call the [facility] operator at [phone] and have them page the [facility] resident on call. Please reserve this option for important issues. For emergencies go to your nearest Emergency Room or call 911.

## 2019-10-13 NOTE — Unmapped (Unsigned)
Pt was able to remove and replace pessary without assistance. Pessary instruction sheet packet given to pt.     Inserted #2, 2-1/4 inches (57 mm) pessary , VHQ#[id number], EXP 2024-04-20, REF [id number]

## 2019-10-15 NOTE — Unmapped (Signed)
Complex Case Management Pre-Assessment Note  Pre-Assessment  NOTE      Summary:  Care Coordinator spoke with patient and verified correct patient using two identifiers today for enrollment in Complex Case Management. Informed patient of Complex Case Management services. Pt has agreed to participate in the Complex Case Management program. Case Manager contact information was provided to patient. Care Coordinator scheduled a time for Case Manager to call patient to complete initial Assessment. Assessment Scheduled for : 6/15 @ 4pm     Patient has bipolar son which does cause some stress. General life stressors are also a factor. Patient states they are doing better financially since the stimulus checks, they have been able to pay off some credit card debts.       General Case management Questions:     General Care Management - Patient Level    Assessment completed with: patient[AH1.1]  Patient lives with: family members, spouse[AH1.1]  Support system: spouse or partner, other family members[AH1.1]  Type of residence: private residence[AH1.1]  DME used at home: scale[AH1.1]  Transportation means: personal vehicle[AH1.1]  Exercise: yes[AH1.1]  Times per week: 3[AH1.1]  Type of exercise: going to the gym (walking, weight equipment)[AH1.1]  Follow special diet?: regular (Comment: no fried food (for liver))[AH1.1]  Interested in seeing dietician?: Yes (Comment: currently seeing RD / Bariatric DR)[AH1.1]  Experiencing side effects from current medications: No[AH1.1]  Interested in seeing pharmacist?: No[AH1.1]  Difficulty keeping appointments: No[AH1.1]  Need assistance with community resources?: No[AH1.1]     Attribution     AH1.1 Tonya Brady 10/15/19 13:51           History Review:     Past Medical History:   Diagnosis Date   ??? Bulging lumbar disc 2005   ??? Cirrhosis of liver (CMS-HCC)    ??? Depression    ??? Diabetes mellitus (CMS-HCC)    ??? Difficult intravenous access    ??? Fibromyalgia 03/09/2013   ??? NASH (nonalcoholic steatohepatitis)    ??? Secondary Sjogren's syndrome (CMS-HCC) 03/09/2013    Secondary to SLE  Diagnosed: 12/2003 (by sx's, parotid gland enlargement, and serologies) Pathology: biopsy not done Labs: + ANA, anti-SSA, anti-SSB, RF    ??? Systemic lupus erythematosus (CMS-HCC) 01/19/2011   ??? Valvular regurgitation      Caregiver burden Unanswered   Cognitive Impairment Unanswered   Falls Risk Unanswered   Financial difficulty Unanswered   Frail Elderly Unanswered   Hearing impairment/loss Unanswered   Homeless Unanswered   Impaired mobility Unanswered   Inadequate social/family support Unanswered   Ineffective family coping Unanswered   Low Literacy Unanswered   Nonadherence to medication Unanswered   Non-english speaking Unanswered   Terminal Illness/Hospice Unanswered   Transportation barriers Unanswered   Visual impairment Unanswered     Past Surgical History:   Procedure Laterality Date   ??? CARPAL TUNNEL RELEASE     ??? CESAREAN SECTION     ??? CHOLECYSTECTOMY     ??? HERNIA REPAIR     ??? HYSTERECTOMY     ??? PR REVISE MEDIAN N/CARPAL TUNNEL SURG Left 12/30/2018    Procedure: R16 NEUROPLASTY AND/OR TRANSPOSITION; MEDIAN NERVE AT CARPAL TUNNEL;  Surgeon: Theodora Blow Jacqlyn Krauss, MD;  [facility];  Service: Orthopedics   ??? PR SALIVARY SURG UNLISTED PROC Right 12/06/2018    Procedure: R22 UNLISTED PROC SALIVARY GLANDS/DUCTS;  Surgeon: Michelle Piper, MD;  Location[facility];  Service: ENT   ??? PR SALIVARY SURG UNLISTED PROC Left 02/18/2019  Procedure: R25 UNLISTED PROC SALIVARY GLANDS/DUCTS;  Surgeon: Michelle Piper, MD;  Location: [facility];  Service: ENT   ??? PR UPPER GI ENDOSCOPY,DIAGNOSIS N/A 05/10/2016    Procedure: UGI ENDO, INCLUDE ESOPHAGUS, STOMACH, & DUODENUM &/OR JEJUNUM; DX W/WO COLLECTION SPECIMN, BY BRUSH OR WASH;  Surgeon: Carmon Ginsberg, MD;  [facility]: [facility];  Service: Gastroenterology   ??? PR UPPER GI ENDOSCOPY,DIAGNOSIS N/A 05/02/2019    Procedure: UGI ENDO, INCLUDE ESOPHAGUS, STOMACH, & DUODENUM &/OR JEJUNUM; DX W/WO COLLECTION SPECIMN, BY BRUSH OR WASH;  Surgeon: Chriss Driver, MD;  Location: [facility];  Service: Gastroenterology   ??? TOTAL ABDOMINAL HYSTERECTOMY     ??? TUBAL LIGATION       Family History   Problem Relation Age of Onset   ??? Cancer Mother    ??? Hypertension Mother    ??? Heart disease Father    ??? Heart attack Father    ??? Depression Son    ??? Mental illness Son    ??? Aneurysm Maternal Aunt    ??? Diabetes Maternal Aunt    ??? Diabetes Maternal Grandmother    ??? Hypertension Maternal Grandmother    ??? Thyroid disease Maternal Grandmother    ??? Breast cancer Maternal Grandmother    ??? Breast cancer Paternal Grandmother    ??? Diabetes Paternal Grandmother    ??? Thyroid disease Paternal Grandmother    ??? Diabetes Maternal Uncle    ??? Heart attack Maternal Uncle    ??? Melanoma Neg Hx    ??? Basal cell carcinoma Neg Hx    ??? Squamous cell carcinoma Neg Hx    ??? Drug abuse Neg Hx    ??? Stroke Neg Hx      Counseling given: Not Answered    Counseling given: Not Answered                     Social History     Substance and Sexual Activity   Drug Use No     Social History     Substance and Sexual Activity   Sexual Activity Not Currently   ??? Partners: Male     Self-Efficacy Score:  SCORE: 5.33 (10/15/2019  1:54 PM)      Medications:   Outpatient Encounter Medications as of 10/15/2019   Medication Sig Dispense Refill   ??? albuterol HFA 90 mcg/actuation inhaler INHALE 2 PUFFS BY MOUTH EVERY 6 HOURS AS NEEDED FOR WHEEZING (Patient not taking: Reported on 10/10/2019) 6.7 g 0   ??? belimumab (BENLYSTA) 200 mg/mL AtIn Inject the contents of 1 syringe (200 mg) under the skin every seven (7) days. 4 mL 2   ??? buPROPion (WELLBUTRIN XL) 150 MG 24 hr tablet Take 1 tablet (150 mg total) by mouth every morning. 60 tablet 2   ??? diclofenac sodium (VOLTAREN) 1 % gel Apply 2 g topically Three (3) times a day as needed for arthritis or pain. (Patient not taking: Reported on 10/10/2019) 100 g 0   ??? famotidine (PEPCID) 40 MG tablet Take 1 tablet (40 mg total) by mouth nightly as needed for heartburn. 90 tablet 3   ??? hydrOXYchloroQUINE (PLAQUENIL) 200 mg tablet Take 1 tablet (200 mg total) by mouth Two (2) times a day. 180 tablet 3   ??? ibuprofen (MOTRIN) 800 MG tablet ibuprofen 800 mg tablet     ??? inhalational spacing device (AEROCHAMBER MV) Spcr 1 each by Miscellaneous route every six (6) hours as  needed. (Patient not taking: Reported on 10/10/2019) 1 each 0   ??? loratadine (CLARITIN) 10 mg tablet Take 10 mg by mouth daily.     ??? melatonin 1 mg Tab tablet Take by mouth nightly.     ??? naltrexone (DEPADE) 50 mg tablet Take 0.5 tablets (25 mg total) by mouth two (2) times a day. 60 tablet 2     No facility-administered encounter medications on file as of 10/15/2019.        Social History Review:        Programmer, applications: Low Risk    ??? Difficulty of Paying Living Expenses: Not hard at all         Food Insecurity: No Food Insecurity   ??? Worried About Programme researcher, broadcasting/film/video in the Last Year: Never true   ??? Ran Out of Food in the Last Year: Never true         Transportation Needs:    ??? Lack of Transportation (Medical):    ??? Lack of Transportation (Non-Medical):          Physical Activity:    ??? Days of Exercise per Week:    ??? Minutes of Exercise per Session:          Stress:    ??? Feeling of Stress :          Intimate Partner Violence:    ??? Fear of Current or Ex-Partner:    ??? Emotionally Abused:    ??? Physically Abused:    ??? Sexually Abused:          Alcohol Use: Not At Risk   ??? How often do you have 5 or more drinks on one occasion?: 0   ??? How many drinks containing alcohol do you have on a typical day when you are drinking?: 0   ??? How often do you have a drink containing alcohol?: 0         Tobacco Use: Low Risk    ??? Smoking Tobacco Use: Never Smoker   ??? Smokeless Tobacco Use: Never Used         Depression: Not at risk   ??? PHQ-2 Score: 2        Upcoming Appointment (s): Future Appointments   Date Time Provider Department Center   11/04/2019  2:40 PM Riley Kill, MD [id number]   11/04/2019  3:00 PM Hermelinda Medicus, RD [id number]   12/15/2019  1:20 PM Adriane Penny Pia, [facility]   02/09/2020  8:40 AM Alexandria Lodge, MD [id number]   03/22/2020  8:30 AM Gerhard Munch, MD [facility]         This patient has been enrolled and is currently receiving Complex Case Management services.          Tonya Brady

## 2019-10-22 NOTE — Unmapped (Signed)
[facility] Refill Coordination Note    Specialty Medication(s) to be Shipped:   Inflammatory Disorders: Benlysta    Other medication(s) to be shipped: n/a     SHAQUNA GEIGLE, DOB: Jun 17, 1975  Phone: [phone] (home)       All above HIPAA information was verified with patient.     Was a Nurse, learning disability used for this call? No    Completed refill call assessment today to schedule patient's medication shipment from the [facility] [phone]).       Specialty medication(s) and dose(s) confirmed: Regimen is correct and unchanged.   Changes to medications: Chan reports no changes at this time.  Changes to insurance: No  Questions for the pharmacist: No    Confirmed patient received Welcome Packet with first shipment. The patient will receive a drug information handout for each medication shipped and additional FDA Medication Guides as required.       DISEASE/MEDICATION-SPECIFIC INFORMATION        For patients on injectable medications: Patient currently has 2 doses left.  Next injection is scheduled for 6/3.    SPECIALTY MEDICATION ADHERENCE     Medication Adherence    Patient reported X missed doses in the last month: 1  Specialty Medication: Benlysta  Patient is on additional specialty medications: No  Patient is on more than two specialty medications: No  Any gaps in refill history greater than 2 weeks in the last 3 months: no  Demonstrates understanding of importance of adherence: yes  Informant: patient                Benlysta 200mg /ml: Patient has 14 days of medication on hand       SHIPPING     Shipping address confirmed in Epic.     Delivery Scheduled: Yes, Expected medication delivery date: 6/9.     Medication will be delivered via Same Day Courier to the prescription address in Epic WAM.    [name]   [facility] Specialty Technician

## 2019-10-29 MED FILL — BENLYSTA 200 MG/ML SUBCUTANEOUS AUTO-INJECTOR: 28 days supply | Qty: 4 | Fill #2 | Status: AC

## 2019-10-29 MED FILL — BENLYSTA 200 MG/ML SUBCUTANEOUS AUTO-INJECTOR: SUBCUTANEOUS | 28 days supply | Qty: 4 | Fill #2

## 2019-11-02 NOTE — Unmapped (Signed)
[facility] Follow Up    Assessment/Plan:     Chief Complaint   Patient presents with   ??? Weight Loss       Problem List Items Addressed This Visit        Unprioritized    Class 2 obesity - Primary     [name] is a 44 y.o. WF with Class 2 obesity due to long term steroid use (dx of SLE since age 17). Her medical history includes SLE, cirrhosis, GERD, suspected OSA, depression, and emotional / stress eating. Pt wishes weight loss to prevent further liver damage.    Weight Summary:  1. Starting weight/BMI: 209 lb, 5' 1 -- BMI 39.6 (06/13/19 self reported)  2. Target weight/goal: 160 lb.  3. Today's weight/BMI: 193 lb, BMI 36.4 (09/17/19 self reported)  4. % Body weight loss: 7.7%  5. Waist Circumference: N/A    Referred by: [name], MD in [facility].    GOALS: Continue working on reducing soda. Gym workout regularly with precautions.    I have reviewed the patient's medical history, lifestyle history and labs/tests.   My recommendations include the following:    Diet quality and patterning: Soda cut down to diet drink twice a week instead of 1 regular soda daily. Endorses healthy home cooked meals.  -- Will meet regularly with our nutritionist during the duration of medical management treatment. Discussed about healthier food choices and advised to home cook for less processed meals.    Exercise: Gym workout (both cardio and strength training) 3 times a week. 2-2.5 hr each.   -- Goal of 30 minutes of exercise 5x a week or a total of 150 minutes weekly.    Sleep: Currently sleeping 6 hours without sleep aids. +OSA screening (STOP-BANG 3/8). Pt declined to do sleep study for now and wishes to focus on lifestyle changes.  -- Recommend 7-8 hours of sleep at night. Advised to try melatonin 10 mg for insomnia.     Stress management: Current stressors: domestic, health, world news, finance. She has an established therapist.  -- Recommend to try relaxation techniques such as breathing techniques, listening to music to help sleep at night and yoga stretching throughout the day to help control stress.     Weight gain causing medication: None.   Hx of long term steroid use (prednisone 80 mg daily for lupus).  Hx of Ambien for insomnia years ago.    Medication: Taking medications as instructed. No s/e. She is responding very well on current regimen. Continue.  Increase Wellbutrin 150 mg BID script sent (pt was on 300 mg before--pt has cirrhosis--I have sent staff message to Dr. Leandra Kern GI and asked if oky due to cirrhosis) Dr Bing Quarry okay to go up to 300mg  per day (MAX) as her cirrhosis is well compensated. pt follows closely with hepatology. . Naltrexone 25 mg BID. Consider GLP1--in the future.   - Wellbutrin: pt was started on this for depression before enrolling at our office. ?weight loss on it.  - Naltrexone: started on 06/13/19 at 209 lb.  Tried  - Orlistat: tried and failed.  Contraindicated  - Topamax: contraindicated dhx of kidney stone.  - Phentermine: contraindicated by hx of palpitations.  - GLP1: consider--no fhx thyroid cancer per pt.      Obesity Surgery: Pt is interested in this option. However pt's BMI is 36 and currently losing 2 lb/wk with good response to medication and healthy lifestyle changes so we will revisit  this option if pt gets on the plateau or gains weight above the BMI 35. (updated 09/17/19)    Medical conditions:  Glaucoma: no  Seizures: no  Medullary thyroid cancer (personal or family hx): Father and PGM both had thyroid goiter. Multiple Endocrine Neoplasia: no  Palpitations/Tachycardia: YES  Chest Pain: no past history of MI.   Headaches/Migraines: YES  Nephrolithiasis: YES  H/o pancreatitis: YES, gallstone pancreatitis and she had cholecystectomy.  GERD: YES    Prior Surgeries:  Choleycystectomy: YES  Hysterectomy: YES    Obesity-associated comorbidities: Cirrhosis, GERD, suspected sleep apnea.  Current barriers include: Anxiety, depression, Sjogren's, lupus, emotional /stress eating, insomnia, fibromyalgia.  Birth Control Methods: N/A; s/p hysterectomy.                Return in about 2 months (around 01/04/2020) for Weight clinic F/U one slot. wellbutrin 150bid, naltrexone 25, ellie. .    I have reviewed and addressed the patient???s adherence and response to prescribed medications. I have identified patient barriers to following the proposed medication and treatment plan, and have noted opportunities to optimize healthy behaviors. I have answered the patient???s questions to satisfaction and the patient voices understanding.    Time: Greater than 50% of this encounter was spent in direct consultation with the patient in evaluation and discussing all of the above. Duration of encounter: 25 minutes.     HPI:     Tonya Brady is a 44 y.o. year old female who has a  has a past medical history of Bulging lumbar disc (2005), Cirrhosis of liver (CMS-HCC), Depression, Diabetes mellitus (CMS-HCC), Difficult intravenous access, Fibromyalgia (03/09/2013), NASH (nonalcoholic steatohepatitis), Secondary Sjogren's syndrome (CMS-HCC) (03/09/2013), Systemic lupus erythematosus (CMS-HCC) (01/19/2011), and Valvular regurgitation. who presents today for [facility] follow up.    Weight Management History    Wt Readings from Last 6 Encounters:   11/04/19 90.8 kg (200 lb 1.9 oz)   10/13/19 90.5 kg (199 lb 9.6 oz)   10/10/19 89.5 kg (197 lb 6.4 oz)   09/30/19 89.4 kg (197 lb)   09/25/19 91.1 kg (200 lb 14.4 oz)   09/17/19 87.5 kg (193 lb)     Update: still issues with hunger.   Medication: tol wellbutrin and naltrexone but still hungry  Diet: cut out all sodas--2 -3 diet coke per week (cut from per day). Sparkling water option.  Eating out for two weeks--renovate, eating late at 9pm.   Exercise: gym--4-5 x week. Treadmill, read a book, wt lifting. [employer].   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 Bulging lumbar disc 2005   ??? Cirrhosis of liver (CMS-HCC)    ??? Depression    ??? Diabetes mellitus (CMS-HCC)    ??? Difficult intravenous access    ??? Fibromyalgia 03/09/2013   ??? NASH (nonalcoholic steatohepatitis)    ??? Secondary Sjogren's syndrome (CMS-HCC) 03/09/2013    Secondary to SLE  Diagnosed: 12/2003 (by sx's, parotid gland enlargement, and serologies) Pathology: biopsy not done Labs: + ANA, anti-SSA, anti-SSB, RF    ???  Systemic lupus erythematosus (CMS-HCC) 01/19/2011   ??? Valvular regurgitation      Past Surgical History:   Procedure Laterality Date   ??? CARPAL TUNNEL RELEASE     ??? CESAREAN SECTION     ??? CHOLECYSTECTOMY     ??? HERNIA REPAIR     ??? HYSTERECTOMY     ??? PR REVISE MEDIAN N/CARPAL TUNNEL SURG Left 12/30/2018    Procedure: R16 NEUROPLASTY AND/OR TRANSPOSITION; MEDIAN NERVE AT CARPAL TUNNEL;  Surgeon: Theodora Blow Jacqlyn Krauss, MD;  Location: ASC OR [facility];  Service: Orthopedics   ??? PR SALIVARY SURG UNLISTED PROC Right 12/06/2018    Procedure: R22 UNLISTED PROC SALIVARY GLANDS/DUCTS;  Surgeon: Michelle Piper, MD;  Location: ASC OR [facility];  Service: ENT   ??? PR SALIVARY SURG UNLISTED PROC Left 02/18/2019    Procedure: R25 UNLISTED PROC SALIVARY GLANDS/DUCTS;  Surgeon: Michelle Piper, MD;  Location: ASC OR [facility];  Service: ENT   ??? PR UPPER GI ENDOSCOPY,DIAGNOSIS N/A 05/10/2016    Procedure: UGI ENDO, INCLUDE ESOPHAGUS, STOMACH, & DUODENUM &/OR JEJUNUM; DX W/WO COLLECTION SPECIMN, BY BRUSH OR WASH;  Surgeon: Carmon Ginsberg, MD;  Location: GI PROCEDURES [facility];  Service: Gastroenterology   ??? PR UPPER GI ENDOSCOPY,DIAGNOSIS N/A 05/02/2019    Procedure: UGI ENDO, INCLUDE ESOPHAGUS, STOMACH, & DUODENUM &/OR JEJUNUM; DX W/WO COLLECTION SPECIMN, BY BRUSH OR WASH;  Surgeon: Chriss Driver, MD;  Location: GI PROCEDURES [facility];  Service: Gastroenterology   ??? TOTAL ABDOMINAL HYSTERECTOMY     ??? TUBAL LIGATION         Social History:     Social History     Socioeconomic History   ??? Marital status: Married     Spouse name: None   ??? Number of children: None   ??? Years of education: None   ??? Highest education level: None   Occupational History   ??? None   Tobacco Use   ??? Smoking status: Never Smoker   ??? Smokeless tobacco: Never Used   Substance and Sexual Activity   ??? Alcohol use: No     Alcohol/week: 0.0 standard drinks   ??? Drug use: No   ??? Sexual activity: Not Currently     Partners: Male   Other Topics Concern   ??? Do you use sunscreen? Yes   ??? Tanning bed use? No   ??? Are you easily burned? No   ??? Excessive sun exposure? No   ??? Blistering sunburns? No   Social History Narrative   ??? None     Social Determinants of Health     Financial Resource Strain: Low Risk    ??? Difficulty of Paying Living Expenses: Not hard at all   Food Insecurity: No Food Insecurity   ??? Worried About Programme researcher, broadcasting/film/video in the Last Year: Never true   ??? Ran Out of Food in the Last Year: Never true   Transportation Needs:    ??? Lack of Transportation (Medical):    ??? Lack of Transportation (Non-Medical):    Physical Activity:    ??? Days of Exercise per Week:    ??? Minutes of Exercise per Session:    Stress:    ??? Feeling of Stress :    Social Connections:    ??? Frequency of Communication with Friends and Family:    ??? Frequency of Social Gatherings with Friends and Family:    ??? Attends Religious Services:    ??? Active Member of Clubs or  Organizations:    ??? Attends Engineer, structural:    ??? Marital Status:        Family History:     Family History   Problem Relation Age of Onset   ??? Cancer Mother    ??? Hypertension Mother    ??? Heart disease Father    ??? Heart attack Father    ??? Depression Son    ??? Mental illness Son    ??? Aneurysm Maternal Aunt    ??? Diabetes Maternal Aunt    ??? Diabetes Maternal Grandmother    ??? Hypertension Maternal Grandmother    ??? Thyroid disease Maternal Grandmother    ??? Breast cancer Maternal Grandmother    ??? Breast cancer Paternal Grandmother    ??? Diabetes Paternal Grandmother    ??? Thyroid disease Paternal Grandmother    ??? Diabetes Maternal Uncle    ??? Heart attack Maternal Uncle    ??? Melanoma Neg Hx    ??? Basal cell carcinoma Neg Hx    ??? Squamous cell carcinoma Neg Hx    ??? Drug abuse Neg Hx    ??? Stroke Neg Hx        Allergies:     Aspirin, Fish containing products, Iodine, and Shellfish containing products    Current Medications:     Current Outpatient Medications   Medication Sig Dispense Refill   ??? albuterol HFA 90 mcg/actuation inhaler INHALE 2 PUFFS BY MOUTH EVERY 6 HOURS AS NEEDED FOR WHEEZING 6.7 g 0   ??? belimumab (BENLYSTA) 200 mg/mL AtIn Inject the contents of 1 syringe (200 mg) under the skin every seven (7) days. 4 mL 2   ??? diclofenac sodium (VOLTAREN) 1 % gel Apply 2 g topically Three (3) times a day as needed for arthritis or pain. 100 g 0   ??? famotidine (PEPCID) 40 MG tablet Take 1 tablet (40 mg total) by mouth nightly as needed for heartburn. 90 tablet 3   ??? hydrOXYchloroQUINE (PLAQUENIL) 200 mg tablet Take 1 tablet (200 mg total) by mouth Two (2) times a day. 180 tablet 3   ??? ibuprofen (MOTRIN) 800 MG tablet ibuprofen 800 mg tablet     ??? inhalational spacing device (AEROCHAMBER MV) Spcr 1 each by Miscellaneous route every six (6) hours as needed. 1 each 0   ??? loratadine (CLARITIN) 10 mg tablet Take 10 mg by mouth daily.     ??? melatonin 1 mg Tab tablet Take by mouth nightly.     ??? naltrexone (DEPADE) 50 mg tablet Take 0.5 tablets (25 mg total) by mouth two (2) times a day. 60 tablet 2   ??? buPROPion (WELLBUTRIN SR) 150 MG 12 hr tablet Take 1 tablet (150 mg total) by mouth Two (2) times a day. 120 tablet 1     No current facility-administered medications for this visit.       I have reviewed and (if needed) updated the patient's problem list, medications, allergies, past medical and surgical history, social and family history.    ROS:     Unless otherwise stated in the HPI:  CONSTITUTIONAL: no fever chills.   HEENT: Eyes: No diplopia or blurred vision. ENT: No earache, sore throat or runny nose.   CARDIOVASCULAR: No pressure, squeezing, strangling, tightness, heaviness or aching about the chest, neck, axilla or epigastrium.   RESPIRATORY: No cough, shortness of breath, PND or orthopnea.   GASTROINTESTINAL: No nausea, vomiting or diarrhea.   GENITOURINARY: No dysuria, frequency or urgency.  MUSCULOSKELETAL: no new pains, no joint swelling or redness.   SKIN: No change in skin, hair or nails.   NEUROLOGIC: No paresthesias, fasciculations, seizures or weakness.   PSYCHIATRIC: No disorder of thought or mood.   ENDOCRINE: No heat or cold intolerance, polyuria or polydipsia.   HEMATOLOGICAL: No easy bruising or bleeding.    Vital Signs:     Wt Readings from Last 3 Encounters:   11/04/19 90.8 kg (200 lb 1.9 oz)   10/13/19 90.5 kg (199 lb 9.6 oz)   10/10/19 89.5 kg (197 lb 6.4 oz)     Temp Readings from Last 3 Encounters:   11/04/19 36.2 ??C (97.2 ??F) (Temporal)   09/03/19 (!) 33 ??C (91.4 ??F) (Temporal)   07/25/19 37.1 ??C (98.7 ??F) (Temporal)     BP Readings from Last 3 Encounters:   11/04/19 109/71   10/10/19 121/61   09/25/19 104/66     Pulse Readings from Last 3 Encounters:   11/04/19 62   10/10/19 63   09/25/19 55     Body mass index is 37.81 kg/m??.     Physical Exam:     General: well appearing, in NAD, Body mass index is 37.81 kg/m??. Ambulatory without help.  Body fat distribution: central & gluteofemoral adiposity. No supraclavicular adiposity. No dorsal adiposity.  Head: normocephalic atraumatic.  Eyes: PERRLA, EOMI, Sclera WNL.   Oral pharynx: moist, no exudate, no erythema, not enlarged. Good dental hygiene. Moderate OP crowding.  Neck: supple, no LAD, no thyromegaly, no bruit.  CV: RRR no rubs or murmurs. Peripheral edema: none.  Lungs: clear bilaterally to ascultation. No wheezing.  Abdomen: soft, NT/ND. No intertrigo. Moderate pannus.  Extremities: no clubbing, cyanosis, or edema.  Skin: no concerning lesions, rashes observed. No lipomas, no acanthosis nigricans.  Neuro: Alert and oriented X 3.    Labs:     Office Visit on 10/10/2019   Component Date Value Ref Range Status   ??? Spec Gravity/POC 10/10/2019 >=1.030  1.003 - 1.030 Final   ??? PH/POC 10/10/2019 5.5  5.0 - 9.0 Final   ??? Leuk Esterase/POC 10/10/2019 Negative  Negative Final   ??? Nitrite/POC 10/10/2019 Negative  Negative Final   ??? Protein/POC 10/10/2019 Negative  Negative Final   ??? UA Glucose/POC 10/10/2019 Negative  Negative Final   ??? Ketones, POC 10/10/2019 Negative  Negative Final   ??? Bilirubin/POC 10/10/2019 Negative  Negative Final   ??? Blood/POC 10/10/2019 Negative  Negative Final   ??? Urobilinogen/POC 10/10/2019 0.2  0.2 - 1.0 mg/dL Final       Follow-up:     Recommend well adult check/physical in 1 year. Otherwise, follow up as below.  Return in about 2 months (around 01/04/2020) for Weight clinic F/U one slot. wellbutrin 150bid, naltrexone 25, ellie. Marland Kitchen    Health maintenance reviewed and recommendations made based on [place] Preventative Task Force (USPTF) recommendations. Reviewed appropriate diet and exercise. Patient stated understanding and there were no barriers to learning.

## 2019-11-02 NOTE — Unmapped (Addendum)
[name] is a 44 y.o. WF with Class 2 obesity due to long term steroid use (dx of SLE since age 35). Her medical history includes SLE, cirrhosis, GERD, suspected OSA, depression, and emotional / stress eating. Pt wishes weight loss to prevent further liver damage.    Weight Summary:  1. Starting weight/BMI: 209 lb, 5' 1 -- BMI 39.6 (06/13/19 self reported)  2. Target weight/goal: 160 lb.  3. Today's weight/BMI: 193 lb, BMI 36.4 (09/17/19 self reported)  4. % Body weight loss: 7.7%  5. Waist Circumference: N/A    Referred by: [name], MD in [facility] Medicine.    GOALS: Continue working on reducing soda. Gym workout regularly with precautions.    I have reviewed the patient's medical history, lifestyle history and labs/tests.   My recommendations include the following:    Diet quality and patterning: Soda cut down to diet drink twice a week instead of 1 regular soda daily. Endorses healthy home cooked meals.  -- Will meet regularly with our nutritionist during the duration of medical management treatment. Discussed about healthier food choices and advised to home cook for less processed meals.    Exercise: Gym workout (both cardio and strength training) 3 times a week. 2-2.5 hr each.   -- Goal of 30 minutes of exercise 5x a week or a total of 150 minutes weekly.    Sleep: Currently sleeping 6 hours without sleep aids. +OSA screening (STOP-BANG 3/8). Pt declined to do sleep study for now and wishes to focus on lifestyle changes.  -- Recommend 7-8 hours of sleep at night. Advised to try melatonin 10 mg for insomnia.     Stress management: Current stressors: domestic, health, world news, finance. She has an established therapist.  -- Recommend to try relaxation techniques such as breathing techniques, listening to music to help sleep at night and yoga stretching throughout the day to help control stress.     Weight gain causing medication: None.   Hx of long term steroid use (prednisone 80 mg daily for lupus).  Hx of Ambien for insomnia years ago.    Medication: Taking medications as instructed. No s/e. She is responding very well on current regimen. Continue.  Increase Wellbutrin 150 mg BID script sent (pt was on 300 mg before--pt has cirrhosis--I have sent staff message to Dr. Leandra Kern GI and asked if oky due to cirrhosis) Dr Bing Quarry okay to go up to 300mg  per day (MAX) as her cirrhosis is well compensated. pt follows closely with hepatology. . Naltrexone 25 mg BID. Consider GLP1--in the future.   - Wellbutrin: pt was started on this for depression before enrolling at our office. ?weight loss on it.  - Naltrexone: started on 06/13/19 at 209 lb.  Tried  - Orlistat: tried and failed.  Contraindicated  - Topamax: contraindicated dhx of kidney stone.  - Phentermine: contraindicated by hx of palpitations.  - GLP1: consider--no fhx thyroid cancer per pt.      Obesity Surgery: Pt is interested in this option. However pt's BMI is 36 and currently losing 2 lb/wk with good response to medication and healthy lifestyle changes so we will revisit this option if pt gets on the plateau or gains weight above the BMI 35. (updated 09/17/19)    Medical conditions:  Glaucoma: no  Seizures: no  Medullary thyroid cancer (personal or family hx): Father and PGM both had thyroid goiter. Multiple Endocrine Neoplasia: no  Palpitations/Tachycardia: YES  Chest Pain: no past history of MI.  Headaches/Migraines: YES  Nephrolithiasis: YES  H/o pancreatitis: YES, gallstone pancreatitis and she had cholecystectomy.  GERD: YES    Prior Surgeries:  Choleycystectomy: YES  Hysterectomy: YES    Obesity-associated comorbidities: Cirrhosis, GERD, suspected sleep apnea.  Current barriers include: Anxiety, depression, Sjogren's, lupus, emotional /stress eating, insomnia, fibromyalgia.  Birth Control Methods: N/A; s/p hysterectomy.

## 2019-11-03 NOTE — Unmapped (Unsigned)
Nutrition Collaborative Consult Note      Present height    Present weight      For Pediatric Patients:  Height percentile: Facility age limit for growth percentiles is 20 years.  Weight percentile: Facility age limit for growth percentiles is 20 years.  {NDKBMIPercentiles:57844}    For Adult Patients:  Current BMI      Waist Circumference (for BMI >25 and <35 without diabetes):   11/03/2019 *** in    Weight History:  Wt Readings from Last 3 Encounters:   10/13/19 90.5 kg (199 lb 9.6 oz)   10/10/19 89.5 kg (197 lb 6.4 oz)   09/30/19 89.4 kg (197 lb)         Physical Activity:  Reading at the gym  - Loves the gym   - 2 weeks wasn't at the gym because house work     24-Hour recall/usual intake:    Time Intake   Breakfast watermelon   Protein Shake    Snack (AM)    Lunch 6 grilled nuggets  Celery   BBQ    Snack (PM)    Dinner [employer]   1/2 cheese burger   1/2 fries  Diet coke    Snack (HS) ***     Other Nutrition Information:  HISTORY  Notes that recent weight high was 212 lbs    Cirosis of the liver - 3 years ago   Sunday had pizza   Wearing a large in a top instead of an extra large  Pants are down to a size 14   Had a good mothers day in [place]   Working ball and eating out more and eating late   Things have been stressful     USUAL INTAKES two-three meals   Hardly ate for the weeks she had COVID  Has been switching out the starch for more vegetable     SNACKING Limited snacking at this time   Having fewer packaged snacks  1/2 banana   Nuts and fruit packets   Yogurt     GROCERY SHOPPING 1-2 times a week   -8-9 people in the house  -- has started keeping things out of the house including chips   -- keeping some things out of the house     COOKING   -- one time a day   -- notes that her daughter has been working on making healthy changes aslo    DINING OUT 1x per week   [employer] out more recently     [employer]   -- notes she is only having diet coke some days  -- mostly seltzer and bottled water   2-3 diet cokes a week   Sparkling water   - son also does sparkling water   - bottles of water     INTOLERANCES Spaghetti sauce     SLEEP 6 is doing well   ??  STRESS Very elevated   -- reports that the Wellbutrin is improving her stress     Estimated Daily Needs:  {Energy Needs:57594}  *** g protein    Nutrition Intervention:  {Nutrition Interventions:3047301}    Materials Provided:  {Materials provided UJWJ:191478295}    Social Determinants of Health screened today. Interventions provided: {ECMSDOHDROP:72168}    Patient Stated Nutrition Goals:  1) Practice distraction when stress are elevated, daily  (100%)               Read  To be reviewed at next appointment   ??  ??  2) Choose fruits and vegetables - as snacks- daily (100%)               Purchase sweets in single serving containers               To be reviewed at next appointment   Add a cheese stick or nuts to reduce hunger at lunch   ??  3) Eliminate diet soda (75%)               Have water and lemon instead               Unsweet tea with lemon   ??  New Goal   ??  1) Have an afternoon snack before dinner (use snack handout)                Fruit and cheese               Fruit and nuts   ??  2) Because ball is done   - eat out less and eat earlier in the day    - pack snacks     Goals Added to Visit Navigator?  {goals yes/no:57886}    Follow-up:  ***, same day as primary care visit    Length of visit was *** minutes  0 unit(s) billed per insurance    Rita Ohara MS RD CDE

## 2019-11-04 ENCOUNTER — Ambulatory Visit
Admit: 2019-11-04 | Discharge: 2019-11-05 | Payer: PRIVATE HEALTH INSURANCE | Attending: Registered" | Primary: Registered"

## 2019-11-04 ENCOUNTER — Ambulatory Visit
Admit: 2019-11-04 | Discharge: 2019-11-05 | Payer: PRIVATE HEALTH INSURANCE | Attending: Family Medicine | Primary: Family Medicine

## 2019-11-04 DIAGNOSIS — E669 Obesity, unspecified: Principal | ICD-10-CM

## 2019-11-04 MED ORDER — BUPROPION HCL SR 150 MG TABLET,12 HR SUSTAINED-RELEASE
ORAL_TABLET | Freq: Two times a day (BID) | ORAL | 1 refills | 60.00000 days | Status: CP
Start: 2019-11-04 — End: ?

## 2019-11-04 MED ORDER — NALTREXONE 50 MG TABLET
ORAL_TABLET | Freq: Two times a day (BID) | ORAL | 2 refills | 60.00000 days | Status: CP
Start: 2019-11-04 — End: ?

## 2019-11-04 NOTE — Unmapped (Signed)
Complex Case Management  SUMMARY NOTE    Attempted to contact pt today at Cell number to complete initial assessment. No answer, unable to leave message; 1st attempt    Discuss at next visit: Complete Initial Assessment     Ellison Hughs, MSN, RN

## 2019-11-04 NOTE — Unmapped (Signed)
1) Practice distraction when stress are elevated, daily  (100%)               Read              To be reviewed at next appointment   ??  ??  2) Choose fruits and vegetables - as snacks- daily (100%)               Purchase sweets in single serving containers               To be reviewed at next appointment   Add a cheese stick or nuts to reduce hunger at lunch   ??  3) Eliminate diet soda (75%)               Have water and lemon instead               Unsweet tea with lemon   ??  New Goal   ??  1) Have an afternoon snack before dinner (use snack handout)                Fruit and cheese               Fruit and nuts   ??  2) Because ball is done   - eat out less and eat earlier in the day    - pack snacks

## 2019-11-06 NOTE — Unmapped (Signed)
Complex Case Management  SUMMARY NOTE    Attempted to contact pt today at Home number to complete initial assessment. Left message to return call.; 2nd attempt    Discuss at next visit: Complete Initial Assessment       Adrian Prince - Case Manager   [facility] Clinical Services  [address], [address] [place], [place] [address]  p. [phone]   [email or web address]
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
## 2019-11-20 DIAGNOSIS — M329 Systemic lupus erythematosus, unspecified: Principal | ICD-10-CM

## 2019-11-20 MED ORDER — BENLYSTA 200 MG/ML SUBCUTANEOUS AUTO-INJECTOR
SUBCUTANEOUS | 2 refills | 28.00000 days | Status: CP
Start: 2019-11-20 — End: ?
  Filled 2019-11-25: qty 4, 28d supply, fill #0

## 2019-11-20 NOTE — Unmapped (Signed)
[facility] Clinical Assessment & Refill Coordination Note    Tonya Brady, DOB: 06-Oct-1975  Phone: [phone] (home)     All above HIPAA information was verified with patient.     Was a Nurse, learning disability used for this call? No    Specialty Medication(s):   Inflammatory Disorders: Benlysta     Current Outpatient Medications   Medication Sig Dispense Refill   ??? albuterol HFA 90 mcg/actuation inhaler INHALE 2 PUFFS BY MOUTH EVERY 6 HOURS AS NEEDED FOR WHEEZING (Patient not taking: Reported on 11/14/2019) 6.7 g 0   ??? belimumab (BENLYSTA) 200 mg/mL AtIn Inject the contents of 1 syringe (200 mg) under the skin every seven (7) days. 4 mL 2   ??? buPROPion (WELLBUTRIN SR) 150 MG 12 hr tablet Take 1 tablet (150 mg total) by mouth Two (2) times a day. 120 tablet 1   ??? diclofenac sodium (VOLTAREN) 1 % gel Apply 2 g topically Three (3) times a day as needed for arthritis or pain. 100 g 0   ??? famotidine (PEPCID) 40 MG tablet Take 1 tablet (40 mg total) by mouth nightly as needed for heartburn. 90 tablet 3   ??? hydrOXYchloroQUINE (PLAQUENIL) 200 mg tablet Take 1 tablet (200 mg total) by mouth Two (2) times a day. 180 tablet 3   ??? ibuprofen (MOTRIN) 800 MG tablet ibuprofen 800 mg tablet     ??? inhalational spacing device (AEROCHAMBER MV) Spcr 1 each by Miscellaneous route every six (6) hours as needed. (Patient not taking: Reported on 11/14/2019) 1 each 0   ??? loratadine (CLARITIN) 10 mg tablet Take 10 mg by mouth daily. Takes as needed     ??? melatonin 1 mg Tab tablet Take by mouth nightly.     ??? naltrexone (DEPADE) 50 mg tablet Take 0.5 tablets (25 mg total) by mouth two (2) times a day. 60 tablet 2     No current facility-administered medications for this visit.        Changes to medications: Miliani reports no changes at this time.    Allergies   Allergen Reactions   ??? Aspirin Swelling     Other reaction(s): SWELLING/EDEMA   ??? Fish Containing Products Diarrhea and Nausea And Vomiting   ??? Iodine Other (See Comments)     Pt told not to have d/t shellfish allergy   ??? Shellfish Containing Products Hives, Diarrhea and Nausea Only     Other reaction(s): HIVES  Other reaction(s): HIVES  Other reaction(s): NAUSEA       Changes to allergies: No    SPECIALTY MEDICATION ADHERENCE     Benlysta 200mg /ml: 7 days of medicine on hand     Medication Adherence    Patient reported X missed doses in the last month: 0  Specialty Medication: Benlysta 200mg /ml          Specialty medication(s) dose(s) confirmed: Regimen is correct and unchanged.     Are there any concerns with adherence? No    Adherence counseling provided? Not needed    CLINICAL MANAGEMENT AND INTERVENTION      Clinical Benefit Assessment:    Do you feel the medicine is effective or helping your condition? Yes    Clinical Benefit counseling provided? Not needed    Adverse Effects Assessment:    Are you experiencing any side effects? No    Are you experiencing difficulty administering your medicine? No    Quality of Life Assessment:    Rheumatology:  Quality of Life    On a scale of 1 ??? 10 with 1 representing not at all and 10 representing completely ??? how has your rheumatologic condition affected your:  Daily pain level?: 3  Ability to complete your regular daily tasks (prepare meals, get dressed, etc.)?: 1  Ability to participate in social or family activities?: 1         Have you discussed this with your provider? Not needed    Therapy Appropriateness:    Is therapy appropriate? Yes, therapy is appropriate and should be continued    DISEASE/MEDICATION-SPECIFIC INFORMATION      For patients on injectable medications: Patient currently has 1 doses left.  Next injection is scheduled for 11/20/2019.    PATIENT SPECIFIC NEEDS     - Does the patient have any physical, cognitive, or cultural barriers? No    - Is the patient high risk? No     - Does the patient require a Care Management Plan? No     - Does the patient require physician intervention or other additional services (i.e. nutrition, smoking cessation, social work)? No      SHIPPING     Specialty Medication(s) to be Shipped:   Inflammatory Disorders: Benlysta 200mg /ml    Other medication(s) to be shipped: none       Changes to insurance: No    Delivery Scheduled: Yes, Expected medication delivery date: 11/25/2019.  However, Rx request for refills was sent to the provider as there are none remaining.     Medication will be delivered via Same Day Courier to the confirmed prescription address in [facility].    The patient will receive a drug information handout for each medication shipped and additional FDA Medication Guides as required.  Verified that patient has previously received a Conservation officer, historic buildings.    All of the patient's questions and concerns have been addressed.    Karene Fry Annaliza Zia   [facility] Specialty Pharmacist

## 2019-11-25 MED FILL — BENLYSTA 200 MG/ML SUBCUTANEOUS AUTO-INJECTOR: 28 days supply | Qty: 4 | Fill #0 | Status: AC

## 2019-11-27 NOTE — Unmapped (Signed)
Complex Case Management  SUMMARY NOTE    Attempted to contact pt today at Home number to follow up for Complex Case Management services. Left message to return call.; 1st attempt    Discuss at next visit: follow up on healthy eating       Adrian Prince - Case Manager   [facility]  [address], [address] [place], [place] [address]  p. [phone]   [email or web address]

## 2019-12-02 NOTE — Unmapped (Signed)
COMPLEX CASE MANAGEMENT   Brief Note    [facility] sent patient information via mychart about healthy eating. Healthy Diet: Fruits and Vegetables.    Plan:     1. Case Management to: Follow up in 2 weeks    2. Patient/caregiver to: Contact PCP / Complex case management as needs arise.     Discuss at next outreach: Advanced Care Planning    Care Coordination Note updated in [facility]: Yes        Lesia Hausen - High Risk Care Coordinator   [facility] Clinical Services  [address] [place], [place] [address]  p. [phone]   [email or web address]

## 2019-12-08 DIAGNOSIS — K7460 Unspecified cirrhosis of liver: Principal | ICD-10-CM

## 2019-12-08 DIAGNOSIS — M329 Systemic lupus erythematosus, unspecified: Principal | ICD-10-CM

## 2019-12-08 DIAGNOSIS — Z1289 Encounter for screening for malignant neoplasm of other sites: Principal | ICD-10-CM

## 2019-12-08 DIAGNOSIS — K7581 Nonalcoholic steatohepatitis (NASH): Principal | ICD-10-CM

## 2019-12-08 NOTE — Unmapped (Signed)
Order for [place] expired. Cancelled old order and entered new order. Will message patient via MyChart to call radiology scheduling to book appointment.

## 2019-12-10 NOTE — Unmapped (Signed)
COMPLEX CASE MANAGEMENT   Brief Note    [facility] sent ACP education via mychart    Plan:     1. Case Management to: Follow up in 2 weeks    2. Patient/caregiver to: Contact PCP / Complex case management as needs arise.     Discuss at next outreach: Advanced Care Planning    Care Coordination Note updated in [facility]: Yes      Tonya Brady - High Risk Care Coordinator   [facility] Clinical Services  [address], [address] [place], [place] [address]  p. [phone]   [email or web address]

## 2019-12-12 NOTE — Unmapped (Signed)
[facility] Refill Coordination Note    Specialty Medication(s) to be Shipped:   Inflammatory Disorders: Benlysta    Other medication(s) to be shipped: No additional medications requested for fill at this time     SAREENA ODEH, DOB: Jan 29, 1976  Phone: [phone] (home)       All above HIPAA information was verified with patient.     Was a Nurse, learning disability used for this call? No    Completed refill call assessment today to schedule patient's medication shipment from the [facility] [phone]).       Specialty medication(s) and dose(s) confirmed: Regimen is correct and unchanged.   Changes to medications: Rondalyn reports no changes at this time.  Changes to insurance: No  Questions for the pharmacist: No    Confirmed patient received Welcome Packet with first shipment. The patient will receive a drug information handout for each medication shipped and additional FDA Medication Guides as required.       DISEASE/MEDICATION-SPECIFIC INFORMATION        For patients on injectable medications: Patient currently has 1 doses left.  Next injection is scheduled for 7/29.    SPECIALTY MEDICATION ADHERENCE     Medication Adherence    Patient reported X missed doses in the last month: 0  Specialty Medication: Benlysta  Patient is on additional specialty medications: No  Patient is on more than two specialty medications: No  Any gaps in refill history greater than 2 weeks in the last 3 months: no  Demonstrates understanding of importance of adherence: yes  Informant: patient                Benlysta 200mg /ml: Patient has 7 days of medication on hand      SHIPPING     Shipping address confirmed in Epic.     Delivery Scheduled: Yes, Expected medication delivery date: 8/3.     Medication will be delivered via Same Day Courier to the prescription address in Epic [facility].    [name]   [facility] Specialty Technician

## 2019-12-15 NOTE — Unmapped (Signed)
Pt called requesting to change Benlysta delivery to be delivered 12/17/19 Same Day

## 2019-12-16 ENCOUNTER — Ambulatory Visit: Admit: 2019-12-16 | Discharge: 2019-12-17 | Payer: PRIVATE HEALTH INSURANCE

## 2019-12-16 DIAGNOSIS — K7581 Nonalcoholic steatohepatitis (NASH): Principal | ICD-10-CM

## 2019-12-16 DIAGNOSIS — K7460 Unspecified cirrhosis of liver: Principal | ICD-10-CM

## 2019-12-17 MED FILL — BENLYSTA 200 MG/ML SUBCUTANEOUS AUTO-INJECTOR: SUBCUTANEOUS | 28 days supply | Qty: 4 | Fill #1

## 2019-12-17 MED FILL — BENLYSTA 200 MG/ML SUBCUTANEOUS AUTO-INJECTOR: 28 days supply | Qty: 4 | Fill #1 | Status: AC

## 2019-12-24 NOTE — Unmapped (Signed)
COMPLEX CASE MANAGEMENT   FOLLOW UP NOTE  Summary:  Advocate Case Manager spoke with patient and verified correct patient using two identifiers today for Complex Case Management follow up. Patient currently resides at Home. Primary concern is none at this time.      Subjective:    Patient/caregiver reported she is on vacation right now. She has received info via MyChart on diet and ACP but has not had a chance to review it yet.Patient states she has been working on her weight and has gone down to 187 lbs.        Objective:     Screenings Completed during visit: None completed at this visit    Barriers to care: None Identified    Interventions provided: none at this time    Progress towards  goal : On Track patient has received info via MyChart    Plan:     1. Case Management to: Follow up in 2 weeks    2. Patient/caregiver to: Notify PCP/CM for any questions/concerns    Discuss at next outreach: psych support    Care Coordination Note updated in [facility]: No    Future Appointments   Date Time Provider Department Center   01/06/2020  1:00 PM Hermelinda Medicus, RD [id number]   01/07/2020 11:10 AM Riley Kill, MD [id number]   02/09/2020  8:40 AM Alexandria Lodge, MD [id number]   03/22/2020  8:30 AM Gerhard Munch, MD [facility]   06/14/2020  9:30 AM Verlee Monte [place] [facility] [place]   06/14/2020 10:40 AM Pia Mau, MD [facility]       Problem List     ??? Lupus (Chronic)    ??? Nasal inflammation due to allergen    ??? Secondary Sjogren's syndrome (Chronic)    ??? MUSCLE INFLAMMATION (Chronic)    ??? Restless leg syndrome    ??? Decreased platelet count    ??? Heart pounding    ??? Morbid obesity    ??? Liver cirrhosis secondary to NASH (nonalcoholic steatohepatitis)    ??? Acid reflux disease    ??? Acute left ankle pain    ??? Acute carpal tunnel syndrome of left wrist    ??? Encounter for annual physical exam    ??? Acquired trigger finger    ??? Swelling of right parotid gland ??? Swelling of left parotid gland    ??? Acute cystitis without hematuria    ??? Obesity    ??? COVID-19          Allergies:   Allergies   Allergen Reactions   ??? Aspirin Swelling     Other reaction(s): SWELLING/EDEMA   ??? Fish Containing Products Diarrhea and Nausea And Vomiting   ??? Iodine Other (See Comments)     Pt told not to have d/t shellfish allergy   ??? Shellfish Containing Products Hives, Diarrhea and Nausea Only     Other reaction(s): HIVES  Other reaction(s): HIVES  Other reaction(s): NAUSEA       Medications:  Prior to Admission medications    Medication Dose, Route, Frequency   albuterol HFA 90 mcg/actuation inhaler INHALE 2 PUFFS BY MOUTH EVERY 6 HOURS AS NEEDED FOR WHEEZING  Patient not taking: Reported on 11/14/2019   belimumab (BENLYSTA) 200 mg/mL AtIn Inject the contents of 1 syringe (200 mg) under the skin every seven (7) days.   buPROPion (WELLBUTRIN SR) 150 MG 12 hr tablet 150 mg, Oral, 2  times a day (standard)   diclofenac sodium (VOLTAREN) 1 % gel 2 g, Topical, 3 times a day PRN   famotidine (PEPCID) 40 MG tablet 40 mg, Oral, Nightly PRN   ibuprofen (MOTRIN) 800 MG tablet ibuprofen 800 mg tablet   inhalational spacing device (AEROCHAMBER MV) Spcr 1 each, Miscellaneous, Every 6 hours PRN  Patient not taking: Reported on 11/14/2019   loratadine (CLARITIN) 10 mg tablet 10 mg, Oral, Daily (standard), Takes as needed   melatonin 1 mg Tab tablet Oral, Nightly   naltrexone (DEPADE) 50 mg tablet 25 mg, Oral, 2 times a day          Jackquline Denmark, RN Case Manager  12/24/2019

## 2020-01-05 NOTE — Unmapped (Signed)
Nutrition Collaborative Consult Note      Present height 154.9 cm (5' 1)  Present weight 87.1 kg (192 lb)    For Adult Patients:  Current BMI (!) 36.3      Weight History:  Wt Readings from Last 3 Encounters:   01/06/20 87.1 kg (192 lb)   11/05/19 90.7 kg (200 lb)   11/04/19 90.8 kg (200 lb 1.9 oz)         Physical Activity:  Reading at the gym  - Loves the gym   - 2 weeks wasn't at the gym because house work   [employer]     - can't wait to get back to walking   - was able to run three times while at the gym       24-Hour recall/usual intake:    Time Intake   Breakfast watermelon   Protein Shake     Yogurt      Snack (AM)    Lunch 6 grilled nuggets  Celery   BBQ     Protein Shake and Banana      Snack (PM)    Dinner [employer]   1/2 cheese burger   1/2 fries  Diet coke     Pork Tendorlion   Salad      Snack (HS)      Other Nutrition Information:  HISTORY  Notes that recent weight high was 212 lbs    Cirosis of the liver - 3 years ago   Working ball and eating out more and eating late   Things have been stressful   Had a week of vacation     USUAL INTAKES two-three meals   Watching noodles- choosing wheat   - reduced meat intaek   - reducing portions     SNACKING Limited snacking at this time   Having fewer packaged snacks  1/2 banana   Nuts and fruit packets   Yogurt       GROCERY SHOPPING 1-2 times a week   -8-9 people in the house  -- has started keeping things out of the house including chips   -- keeping some things out of the house     COOKING   -- one time a day   -- notes that her daughter has been working on making healthy changes aslo      DINING OUT 1x per week   Pizza    Eating out more recently     [employer]   -- notes she is only having diet coke some days  Sparkling water   - son also does sparkling water   - bottles of water       INTOLERANCES Spaghetti sauce     SLEEP 6 is doing well   ??  STRESS Very elevated   -- reports that the Wellbutrin is improving her stress     Patient arrives for assistance with weight loss.  Reviewed dietary recall and physical activity.  Set goals for next appointment.       Estimated Daily Needs:  1510 calories (Mifflin-St Jeor equation for BMI > 25 using actual body weight, adjusted for weight loss)  72 g protein  ??    Nutrition Intervention:  Nutrition Counseling    Materials Provided:  N/A    Social Determinants of Health screened today. Interventions provided: N/A - no concerns identified.     Patient Stated Nutrition Goals:  1) Practice distraction when stress are elevated, daily  Read              To be reviewed at next appointment       2) Choose fruits and vegetables - as snacks- daily              Purchase sweets in single serving containers               To be reviewed at next appointment   Add a cheese stick or nuts to reduce hunger at lunch   ??  3) Have 1 diet soda a day               Have water and lemon instead               Unsweet tea with lemon       Goals Added to Visit Navigator?  Yes    Follow-up:  50, same day as primary care visit    Length of visit was 42  minutes  0 unit(s) billed per insurance    Rita Ohara MS RD CDE

## 2020-01-06 ENCOUNTER — Ambulatory Visit
Admit: 2020-01-06 | Discharge: 2020-01-07 | Payer: PRIVATE HEALTH INSURANCE | Attending: Family Medicine | Primary: Family Medicine

## 2020-01-06 ENCOUNTER — Ambulatory Visit
Admit: 2020-01-06 | Discharge: 2020-01-07 | Payer: PRIVATE HEALTH INSURANCE | Attending: Registered" | Primary: Registered"

## 2020-01-06 DIAGNOSIS — E669 Obesity, unspecified: Principal | ICD-10-CM

## 2020-01-06 NOTE — Unmapped (Addendum)
1) Practice distraction when stress are elevated, daily                Read              To be reviewed at next appointment       2) Choose fruits and vegetables - as snacks- daily              Purchase sweets in single serving containers               To be reviewed at next appointment   Add a cheese stick or nuts to reduce hunger at lunch   ??  3) Have 1 diet soda a day               Have water and lemon instead               Unsweet tea with lemon

## 2020-01-06 NOTE — Unmapped (Signed)
[facility] Follow Up    Assessment/Plan:     Chief Complaint   Patient presents with   ??? Weight Loss       Problem List Items Addressed This Visit        Other    Class 2 obesity     [name] is a 44 y.o. 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 Her medical history includes SLE, cirrhosis, GERD, suspected OSA, depression, and emotional / stress eating. Pt wishes weight loss to prevent further liver damage.    Weight Summary:  1. Starting weight/BMI: 209 lb, 5' 1 -- BMI 39.6 (06/13/19 self reported)  2. Target weight/goal: 160 lb.  3. Today's weight/BMI: 192 lb, BMI 36.3 (01/07/2020)  4. % Body weight loss: 8.1%  5. Waist Circumference: 38.75 (01/07/2020)    Referred by: [name], MD in [facility].    GOALS: Reduce soda, try to find carbonated water alternative. Continue regular exercise and reduced sugar intake.    I have reviewed the patient's medical history, lifestyle history and labs/tests.   My recommendations include the following:    Diet quality and patterning: Pt continues to drink 3 diet sodas per week but NO regular soda! Pt is working on finding carbonated waters that she likes. Endorses healthy home cooked meals.  -- Will meet regularly with our nutritionist during the duration of medical management treatment. Discussed about healthier food choices and advised to home cook for less processed meals.    Exercise: Pt is walking outside in the morning. Continues to do regular gym workouts (both cardio and strength training) 3 times a week. 2-2.5 hr each.   -- Goal of 30 minutes of exercise 5x a week or a total of 150 minutes weekly.    Sleep: Currently sleeping 6 hours without sleep aids. +OSA screening (STOP-BANG 3/8). Pt declined to do sleep study for now and wishes to focus on lifestyle changes.  -- Recommend 7-8 hours of sleep at night. Advised to try melatonin 10 mg for insomnia.     Stress management: Current stressors: domestic, health, world news, finance. She has an established therapist.  -- Recommend to try relaxation techniques such as breathing techniques, listening to music to help sleep at night and yoga stretching throughout the day to help control stress.     Weight gain causing medication: None.   Hx of long term steroid use (prednisone 80 mg daily for lupus).  Hx of Ambien for insomnia years ago.    Medication: Taking medications as instructed. No s/e. Dr Sherryll Burger approved Wellbutrin 300mg  per day (MAX) as her cirrhosis is well compensated. Pt follows closely with hepatology. We decided to try Ozempic to increase weight loss. Instruction given; side effect profile discussed. Saxenda prescription and Naltrexone refill sent.   CONTINUE Wellbutrin 300 mg BID, Naltrexone 25 mg BID. START Saxenda 0.6 mg weekly.  - Wellbutrin: pt was started on this for depression before enrolling at our office. ?weight loss on it.  - Naltrexone: started on 06/13/19 at 209 lb.  Tried  - Orlistat: tried and failed.  Contraindicated  - Topamax: contraindicated dhx of kidney stone.  - Phentermine: contraindicated by hx of palpitations.    Obesity Surgery: Pt is interested in this option. However pt's BMI is 36 and rcurently losing 2 lb/wk with good response to medication and healthy lifestyle changes so we will revisit this option if pt gets on  the plateau or gains weight above the BMI 35. (updated 01/07/2020)    Medical conditions:  Glaucoma: no  Seizures: no  Medullary thyroid cancer (personal or family hx): Father and PGM both had thyroid goiter. Multiple Endocrine Neoplasia: no  Palpitations/Tachycardia: YES  Chest Pain: no past history of MI.   Headaches/Migraines: YES  Nephrolithiasis: YES  H/o pancreatitis: YES, gallstone pancreatitis and she had cholecystectomy.  GERD: YES    Prior Surgeries:  Choleycystectomy: YES  Hysterectomy: YES    Obesity-associated comorbidities: Cirrhosis, GERD, suspected sleep apnea.  Current barriers include: Anxiety, depression, Sjogren's, lupus, emotional /stress eating, insomnia, fibromyalgia.  Birth Control Methods: N/A; s/p hysterectomy.         Relevant Medications    liraglutide, weight loss, (SAXENDA) 3 mg/0.5 mL (18 mg/3 mL) injection pen           Return in about 3 months (around 04/08/2020) for Weight clinic F/U one slot. well/ naltrexone/ saxenda, ellie. .    I have reviewed and addressed the patient???s adherence and response to prescribed medications. I have identified patient barriers to following the proposed medication and treatment plan, and have noted opportunities to optimize healthy behaviors. I have answered the patient???s questions to satisfaction and the patient voices understanding.    Time: Greater than 50% of this encounter was spent in direct consultation with the patient in evaluation and discussing all of the above. Duration of encounter: 25 minutes.     HPI:     Tonya Brady is a 44 y.o. year old female who has a  has a past medical history of Bulging lumbar disc (2005), Cirrhosis of liver (CMS-HCC), Depression, Diabetes mellitus (CMS-HCC), Difficult intravenous access, Fibromyalgia (03/09/2013), NASH (nonalcoholic steatohepatitis), Secondary Sjogren's syndrome (CMS-HCC) (03/09/2013), Systemic lupus erythematosus (CMS-HCC) (01/19/2011), and Valvular regurgitation. who presents today for [facility] follow up.    Weight Management History    Wt Readings from Last 6 Encounters:   01/07/20 87.1 kg (192 lb)   01/06/20 87.1 kg (192 lb)   11/05/19 90.7 kg (200 lb)   11/04/19 90.8 kg (200 lb 1.9 oz)   10/13/19 90.5 kg (199 lb 9.6 oz)   10/10/19 89.5 kg (197 lb 6.4 oz)     Update: Pt has lost 8 lbs in the last 8 weeks (1 lb/wk).  Medication: Pt is tolerating wellbutrin and naltrexone well. Denies s/e. Pt is still experiencing sweet cravings.   Diet: Pt has totally cut out regular soda consumption. Drinks ~3 diet sodas per week. Is actively seeking a carbonated water alternative that does not have artificial sweeteners.   Exercise: Pt continues to exercise regularly both aerobic and resistance training.   Barrier: Time.     Lab Results   Component Value Date    LDL Calculated 92 02/13/2019    LDL Cholesterol, Calculated 104 06/23/2011    Hemoglobin A1C 5.4 02/13/2019    Hemoglobin A1C 5.9 02/10/2016    Hemoglobin A1C 5.5 06/23/2011    TSH 2.500 11/10/2015    TSH 1.92 06/23/2011     Past Medical/Surgical History:     Past Medical History:   Diagnosis Date   ??? Bulging lumbar disc 2005   ??? Cirrhosis of liver (CMS-HCC)    ??? Depression    ??? Diabetes mellitus (CMS-HCC)    ??? Difficult intravenous access    ??? Fibromyalgia 03/09/2013   ??? NASH (nonalcoholic steatohepatitis)    ??? Secondary Sjogren's syndrome (CMS-HCC) 03/09/2013    Secondary to SLE  Diagnosed: 12/2003 (by sx's, parotid gland enlargement, and serologies) Pathology: biopsy not done Labs: + ANA, anti-SSA, anti-SSB, RF    ??? Systemic lupus erythematosus (CMS-HCC) 01/19/2011   ??? Valvular regurgitation      Past Surgical History:   Procedure Laterality Date   ??? CARPAL TUNNEL RELEASE     ??? CESAREAN SECTION     ??? CHOLECYSTECTOMY     ??? HERNIA REPAIR     ??? HYSTERECTOMY     ??? PR REVISE MEDIAN N/CARPAL TUNNEL SURG Left 12/30/2018    Procedure: R16 NEUROPLASTY AND/OR TRANSPOSITION; MEDIAN NERVE AT CARPAL TUNNEL;  Surgeon: Theodora Blow Jacqlyn Krauss, MD;  [facility];  Service: Orthopedics   ??? PR SALIVARY SURG UNLISTED PROC Right 12/06/2018    Procedure: R22 UNLISTED PROC SALIVARY GLANDS/DUCTS;  Surgeon: Michelle Piper, MD;  Location: [facility] OR [facility];  Service: ENT   ??? PR SALIVARY SURG UNLISTED PROC Left 02/18/2019    Procedure: R25 UNLISTED PROC SALIVARY GLANDS/DUCTS;  Surgeon: Michelle Piper, MD;  Location: [facility] OR [facility];  Service: ENT   ??? PR UPPER GI ENDOSCOPY,DIAGNOSIS N/A 05/10/2016    Procedure: UGI ENDO, INCLUDE ESOPHAGUS, STOMACH, & DUODENUM &/OR JEJUNUM; DX W/WO COLLECTION SPECIMN, BY BRUSH OR WASH; Surgeon: Carmon Ginsberg, MD;  [facility]: [facility];  Service: Gastroenterology   ??? PR UPPER GI ENDOSCOPY,DIAGNOSIS N/A 05/02/2019    Procedure: UGI ENDO, INCLUDE ESOPHAGUS, STOMACH, & DUODENUM &/OR JEJUNUM; DX W/WO COLLECTION SPECIMN, BY BRUSH OR WASH;  Surgeon: Chriss Driver, MD;  [facility]: [facility];  Service: Gastroenterology   ??? TOTAL ABDOMINAL HYSTERECTOMY     ??? TUBAL LIGATION         Social History:     Social History     Socioeconomic History   ??? Marital status: Married     Spouse name: None   ??? Number of children: None   ??? Years of education: None   ??? Highest education level: None   Occupational History   ??? None   Tobacco Use   ??? Smoking status: Never Smoker   ??? Smokeless tobacco: Never Used   Substance and Sexual Activity   ??? Alcohol use: No     Alcohol/week: 0.0 standard drinks   ??? Drug use: No   ??? Sexual activity: Not Currently     Partners: Male   Other Topics Concern   ??? Do you use sunscreen? Yes   ??? Tanning bed use? No   ??? Are you easily burned? No   ??? Excessive sun exposure? No   ??? Blistering sunburns? No   Social History Narrative   ??? None     Social Determinants of Health     Financial Resource Strain: Low Risk    ??? Difficulty of Paying Living Expenses: Not hard at all   Food Insecurity: No Food Insecurity   ??? Worried About Programme researcher, broadcasting/film/video in the Last Year: Never true   ??? Ran Out of Food in the Last Year: Never true   Transportation Needs: No Transportation Needs   ??? Lack of Transportation (Medical): No   ??? Lack of Transportation (Non-Medical): No   Physical Activity: Insufficiently Active   ??? Days of Exercise per Week: 3 days   ??? Minutes of Exercise per Session: 30 min   Stress: Stress Concern Present   ??? Feeling of Stress : To some extent   Social Connections: Unknown   ??? Frequency of Communication with  Friends and Family: Three times a week   ??? Frequency of Social Gatherings with Friends and Family: Three times a week   ??? Attends Religious Services: Never   ??? Active Member of Clubs or Organizations: No   ??? Attends Banker Meetings: Never   ??? Marital Status: Not on file       Family History:     Family History   Problem Relation Age of Onset   ??? Cancer Mother    ??? Hypertension Mother    ??? Heart disease Father    ??? Heart attack Father    ??? Depression Son    ??? Mental illness Son    ??? Aneurysm Maternal Aunt    ??? Diabetes Maternal Aunt    ??? Diabetes Maternal Grandmother    ??? Hypertension Maternal Grandmother    ??? Thyroid disease Maternal Grandmother    ??? Breast cancer Maternal Grandmother    ??? Breast cancer Paternal Grandmother    ??? Diabetes Paternal Grandmother    ??? Thyroid disease Paternal Grandmother    ??? Diabetes Maternal Uncle    ??? Heart attack Maternal Uncle    ??? Melanoma Neg Hx    ??? Basal cell carcinoma Neg Hx    ??? Squamous cell carcinoma Neg Hx    ??? Drug abuse Neg Hx    ??? Stroke Neg Hx        Allergies:     Aspirin, Fish containing products, Iodine, and Shellfish containing products    Current Medications:     Current Outpatient Medications   Medication Sig Dispense Refill   ??? albuterol HFA 90 mcg/actuation inhaler INHALE 2 PUFFS BY MOUTH EVERY 6 HOURS AS NEEDED FOR WHEEZING 6.7 g 0   ??? belimumab (BENLYSTA) 200 mg/mL AtIn Inject the contents of 1 syringe (200 mg) under the skin every seven (7) days. 4 mL 2   ??? buPROPion (WELLBUTRIN SR) 150 MG 12 hr tablet Take 1 tablet (150 mg total) by mouth Two (2) times a day. 120 tablet 1   ??? diclofenac sodium (VOLTAREN) 1 % gel Apply 2 g topically Three (3) times a day as needed for arthritis or pain. 100 g 0   ??? famotidine (PEPCID) 40 MG tablet Take 1 tablet (40 mg total) by mouth nightly as needed for heartburn. 90 tablet 3   ??? ibuprofen (MOTRIN) 800 MG tablet ibuprofen 800 mg tablet     ??? inhalational spacing device (AEROCHAMBER MV) Spcr 1 each by Miscellaneous route every six (6) hours as needed. 1 each 0   ??? loratadine (CLARITIN) 10 mg tablet Take 10 mg by mouth daily. Takes as needed     ??? melatonin 1 mg Tab tablet Take by mouth nightly.     ??? naltrexone (DEPADE) 50 mg tablet Take 0.5 tablets (25 mg total) by mouth two (2) times a day. 60 tablet 2   ??? liraglutide, weight loss, (SAXENDA) 3 mg/0.5 mL (18 mg/3 mL) injection pen Inject SQ 0.6mg  daily 6 mL 1     No current facility-administered medications for this visit.       I have reviewed and (if needed) updated the patient's problem list, medications, allergies, past medical and surgical history, social and family history.    ROS:     Unless otherwise stated in the HPI:  CONSTITUTIONAL: no fever chills.   HEENT: Eyes: No diplopia or blurred vision. ENT: No earache, sore throat or runny nose.   CARDIOVASCULAR: No pressure, squeezing,  strangling, tightness, heaviness or aching about the chest, neck, axilla or epigastrium.   RESPIRATORY: No cough, shortness of breath, PND or orthopnea.   GASTROINTESTINAL: No nausea, vomiting or diarrhea.   GENITOURINARY: No dysuria, frequency or urgency.   MUSCULOSKELETAL: no new pains, no joint swelling or redness.   SKIN: No change in skin, hair or nails.   NEUROLOGIC: No paresthesias, fasciculations, seizures or weakness.   PSYCHIATRIC: No disorder of thought or mood.   ENDOCRINE: No heat or cold intolerance, polyuria or polydipsia.   HEMATOLOGICAL: No easy bruising or bleeding.    Vital Signs:     Wt Readings from Last 3 Encounters:   01/07/20 87.1 kg (192 lb)   01/06/20 87.1 kg (192 lb)   11/05/19 90.7 kg (200 lb)     Temp Readings from Last 3 Encounters:   01/07/20 37.1 ??C (98.8 ??F) (Temporal)   11/04/19 36.2 ??C (97.2 ??F) (Temporal)   09/03/19 (!) 33 ??C (91.4 ??F) (Temporal)     BP Readings from Last 3 Encounters:   01/07/20 117/81   11/04/19 109/71   10/10/19 121/61     Pulse Readings from Last 3 Encounters:   01/07/20 60   11/04/19 62   10/10/19 63     Body mass index is 36.28 kg/m??.     Physical Exam:     General: well appearing, in NAD, Body mass index is 36.28 kg/m??. Ambulatory without help.  Body fat distribution: central & gluteofemoral adiposity. No supraclavicular adiposity. No dorsal adiposity.  Head: normocephalic atraumatic.  Eyes: PERRLA, EOMI, Sclera WNL.   Oral pharynx: moist, no exudate, no erythema, not enlarged. Good dental hygiene. Moderate OP crowding.  Neck: supple, no LAD, no thyromegaly, no bruit.  CV: RRR no rubs or murmurs. Peripheral edema: none.  Lungs: clear bilaterally to ascultation. No wheezing.  Abdomen: soft, NT/ND. No intertrigo. Moderate pannus.  Extremities: no clubbing, cyanosis. No edema.  Skin: no concerning lesions, rashes observed. No lipomas, no acanthosis nigricans.  Neuro: Alert and oriented X 3.    Labs:     No visits with results within 1 Month(s) from this visit.   Latest known visit with results is:   Office Visit on 10/10/2019   Component Date Value Ref Range Status   ??? Spec Gravity/POC 10/10/2019 >=1.030  1.003 - 1.030 Final   ??? PH/POC 10/10/2019 5.5  5.0 - 9.0 Final   ??? Leuk Esterase/POC 10/10/2019 Negative  Negative Final   ??? Nitrite/POC 10/10/2019 Negative  Negative Final   ??? Protein/POC 10/10/2019 Negative  Negative Final   ??? UA Glucose/POC 10/10/2019 Negative  Negative Final   ??? Ketones, POC 10/10/2019 Negative  Negative Final   ??? Bilirubin/POC 10/10/2019 Negative  Negative Final   ??? Blood/POC 10/10/2019 Negative  Negative Final   ??? Urobilinogen/POC 10/10/2019 0.2  0.2 - 1.0 mg/dL Final       Follow-up:     Recommend well adult check/physical in 1 year. Otherwise, follow up as below.  Return in about 3 months (around 04/08/2020) for Weight clinic F/U one slot. well/ naltrexone/ saxenda, ellie. Marland Kitchen    Health maintenance reviewed and recommendations made based on [place] Preventative Task Force (USPTF) recommendations. Reviewed appropriate diet and exercise. Patient stated understanding and there were no barriers to learning.     I attest that I, Angelica R Lackey, personally documented this note while acting as scribe for [facility], MD.      Arlana Pouch, Scribe.  01/07/2020  The documentation recorded by the scribe accurately reflects the service I personally performed and the decisions made by me.    Marshell Garfinkel, MD

## 2020-01-07 ENCOUNTER — Ambulatory Visit
Admit: 2020-01-07 | Discharge: 2020-01-08 | Payer: PRIVATE HEALTH INSURANCE | Attending: Family Medicine | Primary: Family Medicine

## 2020-01-07 DIAGNOSIS — E669 Obesity, unspecified: Principal | ICD-10-CM

## 2020-01-07 MED ORDER — SAXENDA 3 MG/0.5 ML (18 MG/3 ML) SUBCUTANEOUS PEN INJECTOR
SUBCUTANEOUS | 1 refills | 0.00000 days | Status: CP
Start: 2020-01-07 — End: ?

## 2020-01-07 NOTE — Unmapped (Signed)
[facility] Refill Coordination Note    Specialty Medication(s) to be Shipped:   Inflammatory Disorders: Benlysta    Other medication(s) to be shipped: No additional medications requested for fill at this time     ROSSETTA KAMA, DOB: 10/03/1975  Phone: [phone] (home)       All above HIPAA information was verified with patient.     Was a Nurse, learning disability used for this call? No    Completed refill call assessment today to schedule patient's medication shipment from the [facility] [phone]).       Specialty medication(s) and dose(s) confirmed: Regimen is correct and unchanged.   Changes to medications: Jackeline reports no changes at this time.  Changes to insurance: No  Questions for the pharmacist: No    Confirmed patient received Welcome Packet with first shipment. The patient will receive a drug information handout for each medication shipped and additional FDA Medication Guides as required.       DISEASE/MEDICATION-SPECIFIC INFORMATION        For patients on injectable medications: Patient currently has 2 doses left.  Next injection is scheduled for 8/19.    SPECIALTY MEDICATION ADHERENCE     Medication Adherence    Patient reported X missed doses in the last month: 0  Specialty Medication: Benlysta  Patient is on additional specialty medications: No  Patient is on more than two specialty medications: No  Any gaps in refill history greater than 2 weeks in the last 3 months: no  Demonstrates understanding of importance of adherence: yes  Informant: patient                Benlysta 200mg /ml: Patient has 14 days of medication on hand       SHIPPING     Shipping address confirmed in Epic.     Delivery Scheduled: Yes, Expected medication delivery date: 8/31.     Medication will be delivered via Same Day Courier to the prescription address in Epic WAM.    [name]   [facility] Specialty Technician

## 2020-01-15 NOTE — Unmapped (Signed)
Complex Case Management   Final Call- Program Completion  Summary:   Case Manager spoke with patient today to resolve Complex Case Management services. Patient currently resides at Home. Care plan reviewed and addressed.     Patient/caregiver reported she does not have any other needs at this time. Patient confirms receiving the ACP information, Stress Management and Healthier Eating Information.    How would you rate your overall health right now?  (Scale of 1-5) 3- Good     Health Literacy: How confident are you that you understand your health issues/concerns, can participate in your care, and manage your care along with your physician (Scale of 1-3): 3- Confident    Plan:   Provided PCP office number and after-hours nurse line information for future care needs     Care Coordination Note updated in [facility]: Yes    Jordan Hawks BSN RN - Case Chiropodist Program

## 2020-01-20 MED FILL — BENLYSTA 200 MG/ML SUBCUTANEOUS AUTO-INJECTOR: SUBCUTANEOUS | 28 days supply | Qty: 4 | Fill #2

## 2020-01-20 MED FILL — BENLYSTA 200 MG/ML SUBCUTANEOUS AUTO-INJECTOR: 28 days supply | Qty: 4 | Fill #2 | Status: AC

## 2020-01-28 DIAGNOSIS — E669 Obesity, unspecified: Principal | ICD-10-CM

## 2020-01-28 MED ORDER — SAXENDA 3 MG/0.5 ML (18 MG/3 ML) SUBCUTANEOUS PEN INJECTOR
SUBCUTANEOUS | 1 refills | 0.00000 days | Status: CP
Start: 2020-01-28 — End: ?

## 2020-02-09 NOTE — Unmapped (Unsigned)
Assessment/Plan:         Problem List Items Addressed This Visit     None          I have reviewed and addressed the patient???s adherence and response to prescribed medications. I have identified patient barriers to following the proposed medication and treatment plan, and have noted opportunities to optimize healthy behaviors. I have answered the patient???s questions to satisfaction and the patient voices understanding.      No follow-ups on file.       Subjective:            Patient ID: Tonya Brady is a 44 y.o. female who presents for follow up of the following:  Informant: Patient came to appointment alone.    No chief complaint on file.      HPI    Physical exam  Pt overall healthy and well. She is working on weight loss and has recently restarted exercising. No acute complaints or concerns today. Taking Metamucil BID to regulate bowel movements. No tobacco or drug use. No alcohol abuse. Follows with dentist and ophthalmology. Denies chest pain, palpitations, dyspnea, abdominal pain, inconsistent bowels, urinary symptoms, paresthesias, edema, rash.    NAFLD with advanced fibrosis/cirrhosis  Getting [place] q60mo for tumor surveillance as well as LFTs. Following with [facility]. Scheduled for upper endoscopy 08/20/2018 for variceal screening. Last US abdomen 12/16/2019??with mildly heterogeneous echogenicity and a mildly nodular contour of the liver. No focal hepatic lesions. No intrahepatic biliary ductal dilatation. The common bile duct is normal in caliber. LI-RADS ultrasound category: [place] 1- negative. Visualization score: B (moderate limitations)      SLE   Pt currently taking Plaquenil 200 mg BID with famotidine. Also taking Benlysta 200 mg injections weekly. Endorses improved energy levels since starting Benlysta, but has also restarted exercising recently. XR left and right hand on 12/31/2017 revealed nondisplaced fracture of the distal tuft of the left first distal phalanx. No evidence of erosive or inflammatory arthropathy. 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 Following with rheumatology.    MDD/GAD  Pt stable after switching wellbutrin to contrave for weight loss  PHQ-9 PHQ-9 TOTAL SCORE   06/13/2019 4   05/12/2019 4   06/03/2018 3   02/19/2017 0   08/09/2016 1       Morbid obesity  Pt has been struggling to maintain exercise routine since her daughter and grandchildren moved in. She is adhering to diet recs from our nutritionist. No longer taking contrave 2 tabs BID x 3 weeks which was helping with appetite. Following with GI. Planning to enroll in Dr. Paulla Dolly weight clinic.  Breakfast - cheerios/yogurt/fruit or scrambled eggs  Lunch - salad with low fat [place] or wrap  Dinner - grilled chicken breast or pork chop with vegetables  Snacks - grapes, apples, yogurt, triscuits/cheese. Snacks once/day  Portions - normal, no seconds  Drinks - has replaced sugary drinks with diet alternatives, unsweet tea, and water  Exercise - walks outside everyday for 3 miles in . Gets HR up to 120s  Wt Readings from Last 6 Encounters:   01/07/20 87.1 kg (192 lb)   01/06/20 87.1 kg (192 lb)   11/05/19 90.7 kg (200 lb)   11/04/19 90.8 kg (200 lb 1.9 oz)   10/13/19 90.5 kg (199 lb 9.6 oz)   10/10/19 89.5 kg (197 lb 6.4 oz)       Carpal tunnel, left  S/p left carpal tunnel release 12/30/2018.  She endorses some residual swelling. C/w deQuervain tenosynovitis per rheumatology. Recommended to wear thumb splints.    GERD - symptoms well controlled on Pepcid 40 mg qhs prn.    ROS  Comprehensive ROS negative except as per HPI    Outpatient Medications Prior to Visit   Medication Sig Dispense Refill   ??? albuterol HFA 90 mcg/actuation inhaler INHALE 2 PUFFS BY MOUTH EVERY 6 HOURS AS NEEDED FOR WHEEZING 6.7 g 0   ??? belimumab (BENLYSTA) 200 mg/mL AtIn Inject the contents of 1 syringe (200 mg) under the skin every seven (7) days. 4 mL 2   ??? buPROPion (WELLBUTRIN SR) 150 MG 12 hr tablet Take 1 tablet (150 mg total) by mouth Two (2) times a day. 120 tablet 1   ??? diclofenac sodium (VOLTAREN) 1 % gel Apply 2 g topically Three (3) times a day as needed for arthritis or pain. 100 g 0   ??? famotidine (PEPCID) 40 MG tablet Take 1 tablet (40 mg total) by mouth nightly as needed for heartburn. 90 tablet 3   ??? ibuprofen (MOTRIN) 800 MG tablet ibuprofen 800 mg tablet     ??? inhalational spacing device (AEROCHAMBER MV) Spcr 1 each by Miscellaneous route every six (6) hours as needed. 1 each 0   ??? liraglutide, weight loss, (SAXENDA) 3 mg/0.5 mL (18 mg/3 mL) injection pen Inject SQ 0.6mg  daily 15 mL 1   ??? loratadine (CLARITIN) 10 mg tablet Take 10 mg by mouth daily. Takes as needed     ??? melatonin 1 mg Tab tablet Take by mouth nightly.     ??? naltrexone (DEPADE) 50 mg tablet Take 0.5 tablets (25 mg total) by mouth two (2) times a day. 60 tablet 2     No facility-administered medications prior to visit.       The following portions of the patient's history were reviewed and updated as appropriate: allergies, current medications, past medical history, past social history and problem list.          Objective:            Vital Signs  There were no vitals taken for this visit.     Exam  General appearance: alert, cooperative, NAD   Head: normocephalic and atraumatic  Eyes: normal EOMs, PERRL, normal conjunctiva, no discharge  ENT: OP clear and moist, no tonsillar exudates or erythema, normal TMs and external ear canals, normal nasal passages  Neck: normal ROM, supple, no thyromegaly, no cervical/supraclavicular lymphadenopathy  Lungs: normal work of breathing, good air entry and clear to auscultation bilaterally, no wheezes or crackles appreciated   Heart: regular rate and rhythm, S1, S2 normal, no murmur, click, rub or gallop   Abdomen: Abdomen soft, non-tender, non distended. No masses, no organomegaly. Bowel sounds present.  Extremities: extremities normal, atraumatic, no cyanosis or edema  NEURO: AAOx3, appropriately responds to questions, spontaneous movement of extremities  Psych: normal mood and affect       I attest that I, Montel Culver, personally documented this note while acting as scribe for Alexandria Lodge, MD.      Montel Culver, Scribe.  02/09/2020     The documentation recorded by the scribe accurately reflects the service I personally performed and the decisions made by me.    Alexandria Lodge, MD

## 2020-02-09 NOTE — Unmapped (Signed)
Age appropriate preventative care, health maintenance, and anticipatory guidance discussed/handout provided.  Immunizations:  ?? Influenza vaccine - administered today.  ?? Pneumococcal Polysaccharide 23 - 06/17/2008  ?? TdaP - 06/23/2011  Ordered future labs as below.

## 2020-02-10 DIAGNOSIS — M329 Systemic lupus erythematosus, unspecified: Principal | ICD-10-CM

## 2020-02-10 MED ORDER — BENLYSTA 200 MG/ML SUBCUTANEOUS AUTO-INJECTOR
SUBCUTANEOUS | 2 refills | 28.00000 days
Start: 2020-02-10 — End: ?

## 2020-02-10 NOTE — Unmapped (Signed)
[facility] Refill Coordination Note    Specialty Medication(s) to be Shipped:   Inflammatory Disorders: Benlysta    Other medication(s) to be shipped: No additional medications requested for fill at this time     Tonya Brady, DOB: 07/01/1975  Phone: [phone] (home)       All above HIPAA information was verified with patient.     Was a Nurse, learning disability used for this call? No    Completed refill call assessment today to schedule patient's medication shipment from the [facility] [phone]).       Specialty medication(s) and dose(s) confirmed: Regimen is correct and unchanged.   Changes to medications: Brownie reports no changes at this time.  Changes to insurance: No  Questions for the pharmacist: No    Confirmed patient received Welcome Packet with first shipment. The patient will receive a drug information handout for each medication shipped and additional FDA Medication Guides as required.       DISEASE/MEDICATION-SPECIFIC INFORMATION        For patients on injectable medications: Patient currently has 1 doses left.  Next injection is scheduled for 9/23.    SPECIALTY MEDICATION ADHERENCE     Medication Adherence    Patient reported X missed doses in the last month: 0  Specialty Medication: Benlysta  Patient is on additional specialty medications: No  Patient is on more than two specialty medications: No  Any gaps in refill history greater than 2 weeks in the last 3 months: no  Demonstrates understanding of importance of adherence: yes  Informant: patient                Benlysta 200mg /ml: Patient has 7 days of medication on hand      SHIPPING     Shipping address confirmed in Epic.     Delivery Scheduled: Yes, Expected medication delivery date: 9/28.  However, Rx request for refills was sent to the provider as there are none remaining.     Medication will be delivered via Same Day Courier to the prescription address in Epic WAM.    [name]   [facility] Specialty Technician

## 2020-02-12 NOTE — Unmapped (Signed)
Benlysta refill  Last ov: 09/03/2019   Next ov: 03/22/2020     Labs:   AST   Date Value Ref Range Status   09/03/2019 23 <34 U/L Final   07/26/2010 19 14 - 38 U/L Final     ALT   Date Value Ref Range Status   09/03/2019 29 10 - 49 U/L Final   07/26/2010 33 15 - 48 U/L Final     Creatinine/CP   Date Value Ref Range Status   05/29/2011 0.75 0 - 1 MG/DL Final     Creatinine   Date Value Ref Range Status   09/03/2019 0.77 0.60 - 1.10 mg/dL Final   84/13/2440 1.02 0.60 - 1.00 mg/dL Final     WBC   Date Value Ref Range Status   09/03/2019 3.0 (L) 3.5 - 10.5 10*9/L Final   06/30/2013 2.4 (L) 4.5 - 11.0 10*9/L Final     HGB   Date Value Ref Range Status   09/03/2019 14.2 12.0 - 15.5 g/dL Final   72/53/6644 03.4 12.0 - 16.0 g/dL Final     HCT   Date Value Ref Range Status   09/03/2019 43.2 35.0 - 44.0 % Final   06/30/2013 39.2 36.0 - 46.0 % Final     MCV   Date Value Ref Range Status   09/03/2019 90.9 82.0 - 98.0 fL Final   06/30/2013 89 80.0 - 100.0 fL Final     RDW   Date Value Ref Range Status   09/03/2019 13.1 12.0 - 15.0 % Final   06/30/2013 13.3 12.0 - 15.0 % Final     Platelet   Date Value Ref Range Status   09/03/2019 164 150 - 450 10*9/L Final   06/30/2013 149 (L) 150 - 440 10*9/L Final     Neutrophils %   Date Value Ref Range Status   09/03/2019 67.1 % Final     Lymphocytes %   Date Value Ref Range Status   09/03/2019 19.8 % Final     Monocytes %   Date Value Ref Range Status   09/03/2019 11.4 % Final     Eosinophils %   Date Value Ref Range Status   09/03/2019 1.4 % Final     Basophils %   Date Value Ref Range Status   09/03/2019 0.3 % Final

## 2020-02-13 MED ORDER — EMPTY CONTAINER
2 refills | 0 days
Start: 2020-02-13 — End: ?

## 2020-02-13 MED ORDER — BENLYSTA 200 MG/ML SUBCUTANEOUS AUTO-INJECTOR
SUBCUTANEOUS | 2 refills | 28.00000 days | Status: CP
Start: 2020-02-13 — End: ?
  Filled 2020-02-17: qty 4, 28d supply, fill #0

## 2020-02-17 MED FILL — BENLYSTA 200 MG/ML SUBCUTANEOUS AUTO-INJECTOR: 28 days supply | Qty: 4 | Fill #0 | Status: AC

## 2020-03-03 ENCOUNTER — Ambulatory Visit
Admit: 2020-03-03 | Discharge: 2020-03-04 | Payer: PRIVATE HEALTH INSURANCE | Attending: Family Medicine | Primary: Family Medicine

## 2020-03-03 DIAGNOSIS — R002 Palpitations: Principal | ICD-10-CM

## 2020-03-03 DIAGNOSIS — K7460 Unspecified cirrhosis of liver: Secondary | ICD-10-CM

## 2020-03-03 DIAGNOSIS — K7581 Nonalcoholic steatohepatitis (NASH): Secondary | ICD-10-CM

## 2020-03-03 DIAGNOSIS — J3089 Other allergic rhinitis: Principal | ICD-10-CM

## 2020-03-03 DIAGNOSIS — Z0000 Encounter for general adult medical examination without abnormal findings: Principal | ICD-10-CM

## 2020-03-03 DIAGNOSIS — Z23 Encounter for immunization: Principal | ICD-10-CM

## 2020-03-03 LAB — COMPREHENSIVE METABOLIC PANEL
ALBUMIN: 4 g/dL (ref 3.4–5.0)
ALKALINE PHOSPHATASE: 53 U/L (ref 46–116)
ALT (SGPT): 23 U/L (ref 10–49)
ANION GAP: 8 mmol/L (ref 5–14)
AST (SGOT): 21 U/L (ref <=34)
BILIRUBIN TOTAL: 0.3 mg/dL (ref 0.3–1.2)
BLOOD UREA NITROGEN: 10 mg/dL (ref 9–23)
BUN / CREAT RATIO: 12
CHLORIDE: 108 mmol/L — ABNORMAL HIGH (ref 98–107)
CO2: 26.9 mmol/L (ref 20.0–31.0)
CREATININE: 0.85 mg/dL
EGFR CKD-EPI AA FEMALE: >90 mL/min/{1.73_m2} (ref >=60)
EGFR CKD-EPI NON-AA FEMALE: 85 mL/min/{1.73_m2} (ref >=60)
GLUCOSE RANDOM: 82 mg/dL (ref 70–179)
POTASSIUM: 4 mmol/L (ref 3.4–4.5)
PROTEIN TOTAL: 7.5 g/dL (ref 5.7–8.2)
SODIUM: 143 mmol/L (ref 135–145)

## 2020-03-03 LAB — LIPID PANEL
CHOLESTEROL/HDL RATIO SCREEN: 3.6 (ref 1.0–4.5)
HDL CHOLESTEROL: 44 mg/dL (ref 40–60)
LDL CHOLESTEROL CALCULATED: 101 mg/dL — ABNORMAL HIGH (ref 40–99)
NON-HDL CHOLESTEROL: 116 mg/dL (ref 70–130)

## 2020-03-03 LAB — HDL CHOLESTEROL: Cholesterol.in HDL:MCnc:Pt:Ser/Plas:Qn:: 44

## 2020-03-03 LAB — ESTIMATED AVERAGE GLUCOSE: Estimated average glucose:MCnc:Pt:Bld:Qn:Estimated from glycated hemoglobin: 108

## 2020-03-03 LAB — CREATININE: Creatinine:MCnc:Pt:Ser/Plas:Qn:: 0.85

## 2020-03-03 MED ORDER — FLUTICASONE PROPIONATE 50 MCG/ACTUATION NASAL SPRAY,SUSPENSION
Freq: Every day | NASAL | 11 refills | 0.00000 days | Status: CP
Start: 2020-03-03 — End: ?

## 2020-03-03 MED ORDER — OZEMPIC 0.25 MG OR 0.5 MG (2 MG/1.5 ML) SUBCUTANEOUS PEN INJECTOR
SUBCUTANEOUS | 1 refills | 56.00000 days | Status: CP
Start: 2020-03-03 — End: ?

## 2020-03-03 NOTE — Unmapped (Signed)
Age appropriate preventative care, health maintenance, and anticipatory guidance discussed/handout provided.  Immunizations:  ?? Influenza vaccine - administered today.  ?? Pneumococcal Polysaccharide 23 - 06/17/2008  ?? TdaP - 06/23/2011  Screenings:  ?? Pap -s/p hysterectomy  ?? Hep C - neg in 2017  Ordered labs as below.

## 2020-03-03 NOTE — Unmapped (Addendum)
Pt has not had palpitations since losing weight. Had isolated episode in 2017

## 2020-03-03 NOTE — Unmapped (Addendum)
Discussed importance of diet, exercise, and weight loss in mitigating adverse health-related outcomes.   - Continue current dieting efforts.  - Continue current exercise regimen and work on increasing intensity weekly.    Nutrition referral - Following with our weight clinic  Pharmacotherapy - Will send ozempic in place of saxenda since it was not covered  Bariatrics - not discussed    Body mass index is 36.47 kg/m??.  Wt Readings from Last 6 Encounters:   03/03/20 87.5 kg (193 lb)   01/07/20 87.1 kg (192 lb)   01/06/20 87.1 kg (192 lb)   11/05/19 90.7 kg (200 lb)   11/04/19 90.8 kg (200 lb 1.9 oz)   10/13/19 90.5 kg (199 lb 9.6 oz)

## 2020-03-03 NOTE — Unmapped (Signed)
Assessment/Plan:         Problem List Items Addressed This Visit     Allergic rhinitis     Symptoms suboptimally controlled with claritin 10mg  daily and sudafed PRN. Exam notable for intranasal inflammation.  - START flonase two sprays each nostril once daily.  - Recommend trying sinus irrigation.         Relevant Medications    fluticasone propionate (FLONASE) 50 mcg/actuation nasal spray    Palpitations     Pt has not had palpitations since losing weight. Had isolated episode in 2017         Liver cirrhosis secondary to NASH (nonalcoholic steatohepatitis) (CMS-HCC)     Following with [facility] and working on weight loss through our clinic.  Getting [place] q45mo for tumor surveillance as well as LFTs.  Hep serologies initially negative and A/B vaccine series completed 2019         Relevant Orders    Comprehensive Metabolic Panel    Annual physical exam - Primary     Age appropriate preventative care, health maintenance, and anticipatory guidance discussed/handout provided.  Immunizations:  ?? Influenza vaccine - administered today.  ?? Pneumococcal Polysaccharide 23 - 06/17/2008  ?? TdaP - 06/23/2011  Screenings:  ?? Pap -s/p hysterectomy  ?? Hep C - neg in 2017  Ordered labs as below.         Relevant Orders    Hemoglobin A1c    Lipid Panel    Morbid obesity (CMS-HCC)     Discussed importance of diet, exercise, and weight loss in mitigating adverse health-related outcomes.   - Continue current dieting efforts.  - Continue current exercise regimen and work on increasing intensity weekly.    Nutrition referral - Following with our weight clinic  Pharmacotherapy - Will send ozempic in place of saxenda since it was not covered  Bariatrics - not discussed    Body mass index is 36.47 kg/m??.  Wt Readings from Last 6 Encounters:   03/03/20 87.5 kg (193 lb)   01/07/20 87.1 kg (192 lb)   01/06/20 87.1 kg (192 lb)   11/05/19 90.7 kg (200 lb)   11/04/19 90.8 kg (200 lb 1.9 oz)   10/13/19 90.5 kg (199 lb 9.6 oz)              Relevant Medications    semaglutide (OZEMPIC) 0.25 mg or 0.5 mg(2 mg/1.5 mL) PnIj injection      Other Visit Diagnoses     Flu vaccine need        Relevant Orders    INFLUENZA VACCINE (QUAD) IM - 6 MO-ADULT - PF (Completed)    Need for immunization against influenza        Relevant Orders    INFLUENZA VACCINE (QUAD) IM - 6 MO-ADULT - PF (Completed)          I have reviewed and addressed the patient???s adherence and response to prescribed medications. I have identified patient barriers to following the proposed medication and treatment plan, and have noted opportunities to optimize healthy behaviors. I have answered the patient???s questions to satisfaction and the patient voices understanding.      Return in about 1 year (around 03/03/2021) for Annual physical.       Subjective:            Patient ID: Tonya Brady is a 44 y.o. female who presents for follow up of the following:  Informant: Patient came to appointment alone.    Chief  Complaint   Patient presents with   ??? Annual Exam       HPI    Physical exam  Pt overall healthy and well. She is working on weight loss with diet and exercise. No tobacco or drug use. No alcohol abuse. Follows with dentist and ophthalmology. Denies chest pain, palpitations, dyspnea, abdominal pain, inconsistent bowels, urinary symptoms, paresthesias, edema, rash.    Allergic Rhinitis  Pt has been very congested lately. She switched from zyrtec to claritin and take sudafed prn. She has not tried nasal sprays.     NAFLD with advanced fibrosis/cirrhosis  Getting [place] q33mo for tumor surveillance as well as LFTs. Following with [facility]. Last US abdomen 12/16/2019??with mildly heterogeneous echogenicity and a mildly nodular contour of the liver. No focal hepatic lesions. No intrahepatic biliary ductal dilatation. The common bile duct is normal in caliber. LI-RADS ultrasound category: [place] 1- negative.     Lab Results   Component Value Date    ALKPHOS 51 09/03/2019    BILITOT 0.5 09/03/2019    BILIDIR 0.10 03/07/2019    PROT 7.4 09/03/2019    PROT 7.3 09/03/2019    ALBUMIN 4.0 09/03/2019    ALBUMIN 4.1 09/03/2019    ALT 29 09/03/2019    AST 23 09/03/2019     MDD/GAD  Stable with Wellbutrin SR 150mg  BID. She started seeing [facility], who has discussed starting a stimulant. 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   PHQ-9 PHQ-9 TOTAL SCORE   06/13/2019 4   05/12/2019 4   06/03/2018 3   02/19/2017 0   08/09/2016 1     Obesity  Pt states that her weight loss has plateaued. She states that it it discouraging because she has been really working hard on eliminating processed foods and diet cokes. She states that it is difficult to afford healthy food because there are 9 people in her house right now. Staying active by going to the gym doing cardio and weight machines. Following with GI. Following with Dr. Paulla Dolly weight clinic.  Diet:  Trying to eat mostly lean meat.  Drinks -  water    LAST OV:  SLE   Pt currently taking Plaquenil 200 mg BID with famotidine. Also taking Benlysta 200 mg injections weekly. Endorses improved energy levels since starting Benlysta, but has also restarted exercising recently. XR left and right hand on 12/31/2017 revealed nondisplaced fracture of the distal tuft of the left first distal phalanx. No evidence of erosive or inflammatory arthropathy. 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 Following with rheumatology.      ROS  Comprehensive ROS negative except as per HPI    Outpatient Medications Prior to Visit   Medication Sig Dispense Refill   ??? albuterol HFA 90 mcg/actuation inhaler INHALE 2 PUFFS BY MOUTH EVERY 6 HOURS AS NEEDED FOR WHEEZING 6.7 g 0   ??? belimumab (BENLYSTA) 200 mg/mL AtIn Inject the contents of 1 syringe (200 mg) under the skin every seven (7) days. 4 mL 2   ??? buPROPion (WELLBUTRIN SR) 150 MG 12 hr tablet Take 1 tablet (150 mg total) by mouth Two (2) times a day. 120 tablet 1   ??? diclofenac sodium (VOLTAREN) 1 % gel Apply 2 g topically Three (3) times a day as needed for arthritis or pain. 100 g 0   ??? famotidine (PEPCID) 40 MG tablet Take 1 tablet (40 mg total) by mouth  nightly as needed for heartburn. 90 tablet 3   ??? ibuprofen (MOTRIN) 800 MG tablet ibuprofen 800 mg tablet     ??? inhalational spacing device (AEROCHAMBER MV) Spcr 1 each by Miscellaneous route every six (6) hours as needed. 1 each 0   ??? loratadine (CLARITIN) 10 mg tablet Take 10 mg by mouth daily. Takes as needed     ??? melatonin 1 mg Tab tablet Take by mouth nightly.     ??? naltrexone (DEPADE) 50 mg tablet Take 0.5 tablets (25 mg total) by mouth two (2) times a day. 60 tablet 2   ??? liraglutide, weight loss, (SAXENDA) 3 mg/0.5 mL (18 mg/3 mL) injection pen Inject SQ 0.6mg  daily (Patient not taking: Reported on 03/03/2020) 15 mL 1     No facility-administered medications prior to visit.       The following portions of the patient's history were reviewed and updated as appropriate: allergies, current medications, past medical history, past social history and problem list.          Objective:            Vital Signs  BP 118/77  - Pulse 99  - Temp 36.4 ??C (97.5 ??F) (Temporal)  - Wt 87.5 kg (193 lb)  - BMI 36.47 kg/m??      Exam  General appearance: alert, cooperative, NAD   Head: normocephalic and atraumatic  Eyes: normal EOMs, PERRL, normal conjunctiva, no discharge  ENT: OP clear and moist, no tonsillar exudates or erythema, normal TMs and external ear canals, normal nasal passages  Neck: normal ROM, supple, no thyromegaly, no cervical/supraclavicular lymphadenopathy  Lungs: normal work of breathing, good air entry and clear to auscultation bilaterally, no wheezes or crackles appreciated   Heart: regular rate and rhythm, S1, S2 normal, no murmur, click, rub or gallop   Abdomen: Abdomen soft, non-tender, non distended. No masses, no organomegaly. Bowel sounds present.  Extremities: extremities normal, atraumatic, no cyanosis or edema  NEURO: AAOx3, appropriately responds to questions, spontaneous movement of extremities  Psych: normal mood and affect       I attest that I, Montel Culver, personally documented this note while acting as scribe for Alexandria Lodge, MD.      Montel Culver, Scribe.  03/03/2020     The documentation recorded by the scribe accurately reflects the service I personally performed and the decisions made by me.    Alexandria Lodge, MD

## 2020-03-03 NOTE — Unmapped (Addendum)
Following with [facility] and working on weight loss through our clinic.  Getting [place] q45mo for tumor surveillance as well as LFTs.  Hep serologies initially negative and A/B vaccine series completed 2019

## 2020-03-03 NOTE — Unmapped (Signed)
Symptoms suboptimally controlled with claritin 10mg  daily and sudafed PRN. Exam notable for intranasal inflammation.  - START flonase two sprays each nostril once daily.  - Recommend trying sinus irrigation.

## 2020-03-03 NOTE — Unmapped (Addendum)
Patient Education   Well Visit, Ages 36 to 80: Care Instructions  Overview     Well visits can help you stay healthy. Your doctor has checked your overall health and may have suggested ways to take good care of yourself. Your doctor also may have recommended tests. At home, you can help prevent illness with healthy eating, regular exercise, and other steps.  Follow-up care is a key part of your treatment and safety. Be sure to make and go to all appointments, and call your doctor if you are having problems. It's also a good idea to know your test results and keep a list of the medicines you take.  How can you care for yourself at home?  ?? Get screening tests that you and your doctor decide on. Screening helps find diseases before any symptoms appear.  ?? Eat healthy foods. Choose fruits, vegetables, whole grains, protein, and low-fat dairy foods. Limit fat, especially saturated fat. Reduce salt in your diet.  ?? Limit alcohol. If you are a man, have no more than 2 drinks a day or 14 drinks a week. If you are a woman, have no more than 1 drink a day or 7 drinks a week.  ?? Get at least 30 minutes of physical activity on most days of the week. Walking is a good choice. You also may want to do other activities, such as running, swimming, cycling, or playing tennis or team sports. Discuss any changes in your exercise program with your doctor.  ?? Reach and stay at a healthy weight. This will lower your risk for many problems, such as obesity, diabetes, heart disease, and high blood pressure.  ?? Do not smoke or allow others to smoke around you. If you need help quitting, talk to your doctor about stop-smoking programs and medicines. These can increase your chances of quitting for good.  ?? Care for your mental health. It is easy to get weighed down by worry and stress. Learn strategies to manage stress, like deep breathing and mindfulness, and stay connected with your family and community. If you find you often feel sad or hopeless, talk with your doctor. Treatment can help.  ?? Talk to your doctor about whether you have any risk factors for sexually transmitted infections (STIs). You can help prevent STIs if you wait to have sex with a new partner (or partners) until you've each been tested for STIs. It also helps if you use condoms (female or female condoms) and if you limit your sex partners to one person who only has sex with you. Vaccines are available for some STIs, such as HPV.  ?? Use birth control if it's important to you to prevent pregnancy. Talk with your doctor about the choices available and what might be best for you.  ?? If you think you may have a problem with alcohol or drug use, talk to your doctor. This includes prescription medicines (such as amphetamines and opioids) and illegal drugs (such as cocaine and methamphetamine). Your doctor can help you figure out what type of treatment is best for you.  ?? Protect your skin from too much sun. When you're outdoors from 10 a.m. to 4 p.m., stay in the shade or cover up with clothing and a hat with a wide brim. Wear sunglasses that block UV rays. Even when it's cloudy, put broad-spectrum sunscreen (SPF 30 or higher) on any exposed skin.  ?? See a dentist one or two times a year for checkups and to have  your teeth cleaned.  ?? Wear a seat belt in the car.  When should you call for help?  Watch closely for changes in your health, and be sure to contact your doctor if you have any problems or symptoms that concern you.  Where can you learn more?  Go to [facility] at https://myuncchart.org  Select Patient Education under [employer]. Enter P072 in the search box to learn more about Well Visit, Ages 49 to 90: Care Instructions.  Current as of: July 03, 2019??????????????????????????????Content Version: 13.0  ?? 2006-2021 [employer]wise[employer] Inc[employer].   Care instructions adapted under license by [facility]. If you have questions about a medical condition or this instruction, always ask your healthcare professional. Healthwise, Incorporated disclaims any warranty or liability for your use of this information.           Rhinitis: Care Instructions  Your Care Instructions  Rhinitis is swelling and irritation in the nose. Allergies and infections are often the cause. Your nose may run or feel stuffy. Other symptoms are itchy and sore eyes, ears, throat, and mouth.  If allergies are the cause, your doctor may do tests to find out what you are allergic to. You may be able to stop symptoms if you avoid the things that cause them. Your doctor may suggest or prescribe medicine to ease your symptoms.  Follow-up care is a key part of your treatment and safety. Be sure to make and go to all appointments, and call your doctor if you are having problems. It's also a good idea to know your test results and keep a list of the medicines you take.  How can you care for yourself at home?  ?? If your rhinitis is caused by allergies, try to find out what sets off (triggers) your symptoms. Take steps to avoid these triggers.  ? Avoid yard work. It can stir up both pollen and mold.  ? Do not smoke or allow others to smoke around you. If you need help quitting, talk to your doctor about stop-smoking programs and medicines. These can increase your chances of quitting for good.  ? Do not use aerosol sprays, cleaning products, or perfumes.  ? If pollen is one of your triggers, close your house and car windows during blooming season.  ? Clean your house often to control dust.  ? Keep pets outside.  ?? If your doctor recommends over-the-counter medicines to relieve symptoms, take your medicines exactly as prescribed. Call your doctor if you think you are having a problem with your medicine.  ?? Use saline (saltwater) nasal washes to help keep your nasal passages open and wash out mucus and bacteria. You can buy saline nose drops at a grocery store or drugstore. Or you can make your own at home by adding 1 teaspoon of salt and 1 teaspoon of baking soda to 2 cups of distilled water. If you make your own, fill a bulb syringe with the solution, insert the tip into your nostril, and squeeze gently. Blow your nose.  When should you call for help?   Call your doctor now or seek immediate medical care if:  ?? ?? You are having trouble breathing.   Watch closely for changes in your health, and be sure to contact your doctor if:  ?? ?? Mucus from your nose gets thicker (like pus) or has new blood in it.   ?? ?? You have new or worse symptoms.   ?? ?? You do not get better as expected.  Where can you learn more?  Go to [facility] at [email or web address].org  Select Patient Education under [employer]. Enter M030 in the search box to learn more about Rhinitis: Care Instructions.  Current as of: April 23, 2019??????????????????????????????Content Version: 13.0  ?? 2006-2021 Healthwise, Inc[employer].   Care instructions adapted under license by [facility]. If you have questions about a medical condition or this instruction, always ask your healthcare professional. Healthwise, Incorporated disclaims any warranty or liability for your use of this information.

## 2020-03-08 MED ORDER — BUPROPION HCL SR 150 MG TABLET,12 HR SUSTAINED-RELEASE
ORAL_TABLET | 1 refills | 0.00000 days | Status: CP
Start: 2020-03-08 — End: ?

## 2020-03-11 NOTE — Unmapped (Signed)
[facility] Refill Coordination Note    Specialty Medication(s) to be Shipped:   Inflammatory Disorders: Benlysta    Other medication(s) to be shipped: No additional medications requested for fill at this time     DELINDA MALAN, DOB: 11-13-1975  Phone: [phone] (home)       All above HIPAA information was verified with patient.     Was a Nurse, learning disability used for this call? No    Completed refill call assessment today to schedule patient's medication shipment from the [facility] [phone]).       Specialty medication(s) and dose(s) confirmed: Regimen is correct and unchanged.   Changes to medications: Atziri reports no changes at this time.  Changes to insurance: No  Questions for the pharmacist: No    Confirmed patient received Welcome Packet with first shipment. The patient will receive a drug information handout for each medication shipped and additional FDA Medication Guides as required.       DISEASE/MEDICATION-SPECIFIC INFORMATION        For patients on injectable medications: Patient currently has 0 doses left.  Next injection is scheduled for 10/28.    SPECIALTY MEDICATION ADHERENCE     Medication Adherence    Patient reported X missed doses in the last month: 0  Specialty Medication: benlysta  Patient is on additional specialty medications: No  Patient is on more than two specialty medications: No  Any gaps in refill history greater than 2 weeks in the last 3 months: no  Demonstrates understanding of importance of adherence: yes  Informant: patient                Benlysta 200mg /ml: Patient has 0 days of medication on hand       SHIPPING     Shipping address confirmed in Epic.     Delivery Scheduled: Yes, Expected medication delivery date: 10/26.     Medication will be delivered via Same Day Courier to the prescription address in Epic WAM.    [name]   [facility] Specialty Technician

## 2020-03-16 DIAGNOSIS — K219 Gastro-esophageal reflux disease without esophagitis: Principal | ICD-10-CM

## 2020-03-16 MED ORDER — FAMOTIDINE 40 MG TABLET
ORAL_TABLET | 3 refills | 0 days | Status: CP
Start: 2020-03-16 — End: ?

## 2020-03-16 MED FILL — BENLYSTA 200 MG/ML SUBCUTANEOUS AUTO-INJECTOR: 28 days supply | Qty: 4 | Fill #1 | Status: AC

## 2020-03-16 MED FILL — BENLYSTA 200 MG/ML SUBCUTANEOUS AUTO-INJECTOR: SUBCUTANEOUS | 28 days supply | Qty: 4 | Fill #1

## 2020-03-22 ENCOUNTER — Ambulatory Visit
Admit: 2020-03-22 | Discharge: 2020-03-23 | Payer: PRIVATE HEALTH INSURANCE | Attending: Internal Medicine | Primary: Internal Medicine

## 2020-03-22 DIAGNOSIS — Z0000 Encounter for general adult medical examination without abnormal findings: Principal | ICD-10-CM

## 2020-03-22 DIAGNOSIS — Z6837 Body mass index (BMI) 37.0-37.9, adult: Principal | ICD-10-CM

## 2020-03-22 DIAGNOSIS — M329 Systemic lupus erythematosus, unspecified: Principal | ICD-10-CM

## 2020-03-22 NOTE — Unmapped (Signed)
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 She has been treated with long-standing HCQ, prior azathioprine [discontinued 2/2 nausea], MTX [discontinued 2/2 GI effects, LFTs]. Initiated belimumab 2020 for MSK pain. Since last visit she is feeling overall clinically well. She does have some hand left sided MSK features which are most concerning for nerve entrapment with her history of CTS for which she is following up again with ortho hand. Otherwise exam today reassuring without features to suggest active SLE/Sjogrens.   -- Labs: reviewed 08/2019 and 02/2020 labs  -- Medications: Continue HCQ 200mg  BID, belimumab weekly  -- Patient will reach out again if she develops parotid flare-up for repeat sialendoscopy as this was the most helpful intervention for parotid pain flare-ups.  -- UTD dental eval 02/2020    Healthcare maintenance  -- Vaccinations: covid UTD; booster discussed today; UTD influenza  -- HCQ: Due in 2022 -> encouraged follow-up    RTC in Return in about 7 months (around 10/20/2020).    This patient was seen by [name]. Patient was discussed with Dr. Ilsa Iha who agrees with the plan outlined above.    Subjective:   Primary Care Provider: Alexandria Lodge, MD    HPI:  Tonya Brady is a 44 y.o.  female with a PMH of SLE and secondary Sjogrens, NASH cirrhosis, GERD, anxiety presenting for follow-up. Last seen in clinic 08/2019 at which time she was continued on belimumab, HCQ. She was Rxed topical diclofenac for hand pain.     Medical updates:  -- Continued follow-up with weight management.    Today she is feeling overall stable and well.     MSK:   -- Hands: She notes over the last few months she developed recurrent paresthesias on her LUE palmar aspect of her first digit. If she bumps her left wrist she experiences paresthesias shooting into palm/digit. These symptoms are reminiscent of her prior CTS symptoms. She is planning to follow-up with Dr. Jarold Motto soon.  -- She otherwise notes intermittent pain/stiffness in UE digits with use and activities such as cleaning or using the computer. She has swelling intermittently in her digits again after use or activity. She has a history of trigger finger and feels that sometimes digits will lock in place. Her UE digits/wrists can ache at times particularly with cold weather. Her hips and knees are also starting to ache with colder weather which is typical for her.  -- Topical diclofenac has been somewhat helpful for her UE digit symptoms.  -- She still feels clinical benefit from belimumab.    Parotids:   -- She notes intermittent ongoing L>R sided parotid discomfort slowly starting to recur particularly at night. She is having difficulty elucidating if her discomfort is actually more related to her increased nasal congestive symptoms for which she has been on claritin and fluticasone. Overall the parotid discomfort improves with warm water shows and massage. Pain/discomfort is nowhere near prior flare-ups.     Further ROS as below:  -- Constitutional: -chills, -fevers, +fatigue [chronic]  -- HENT: +dry mouth, -congestion, -dental problem  -- Eyes: +dry eyes, -pain, -redness, -visual disturbance   -- Respiratory: +cough [intermittent - associates with congestion], -shortness of breath  -- Cardiovascular: -chest pain  -- Gastrointestinal: -abdominal pain, +alternating constipation/diarrhea [chronic]  -- Genitourinary: -dysuria  -- Musculoskeletal: +arthralgias [as  above]  -- Skin: +rash [bilateral facial flattened erythema - stable/chronic]  -- Neurological:+numbness [LUE 1st digit as above]  -- Hematological: -adenopathy    Disease History:  -- 2005 presented with bilateral partoid swelling, sicca, pancytopenia. Hospitalized with small pericardial effusion.  Eval at that time with +ANA 1:320 speckled, +SSA, +SSB, +RF 120, -dsDNA. Initiated HCQ.  -- 2007 - 2010 continued PRN prednisone for parotid swelling. Concurrent intermittent antibiotics for concern for concurrent infections. Evaluated in ENT.  -- 2009 trial of MTX for degree of parotid swelling. Hospitalized for abdominal pain & elevated LFTs. MTX discontinued. After discontinuation of MTX developed worsening joint pain & swelling of hands.   -- 2011 developed ACLE/malar rash after patient had discontinued HCQ. At that time labs were concerning for +ANA 1:640 speckled, -dsDNA. Restarted HCQ.  -- 2012 concern for worsening arthritis pain. Initiated azathioprine while continued HCQ.  -- 2013 patient self-discontinued medications for nausea.  -- 2014 re-presented with malar & anterior throat rash for which HCQ was restarted.  -- 2015-2017 lost to follow-up. Restarted HCQ. Concern for elevated LFTs for which she was evaluated by GI & underwent liver biopsy which confirmed NAFLD/fibrosis.  -- 2018 - 2019 continued on HCQ. Concern for active synovitis on exam promtping steroid taper.   -- 2020 belimumab prescribed for persistent arthralgias, new rash. Underwent bilateral sialendoscopies which improved her salivary movement significantly.    -- 2021 diagnosed with COVID. Experienced significant side effects with Regeneron with anaphylaxis.  ??  Treatment:  -- Belimumab [2020 - current]  -- HCQ [2005 - current; intermittent lapses in medication use - current]  -- MTX [2009] - discontinued 2/2 severe GI side effects  -- Azathioprine [2012 - 2013]  -- Multiple prednisone tapers    Labs:  -- As above [not all available dating back to 2005]  -- 2019: +SSA/SSB, +RF 30, -CCP    Imaging:  -- 2019: Xray hands: Nondisplaced fracture of the distal tuft of the left first distal phalanx. No evidence of erosive or inflammatory arthropathy.  -- 2019: Xray L foot: No acute fracture or malalignment. Normal soft tissues.     PMH, PFH, PSH as previously documented.  Medications were reviewed this visit.    Objective:     Vitals:    03/22/20 0848   BP: 109/71   BP Site: L Arm   BP Position: Sitting   BP Cuff Size: Medium   Pulse: 60   Temp: 36.2 ??C   TempSrc: Temporal   Weight: 88.8 kg (195 lb 12.8 oz)     Body mass index is 37 kg/m??.    Physical Exam  -- GENERAL: sitting up; no acute distress; appears comfortable  -- HEAD: atraumatic  -- EYES: normal sclera; conjunctivae are clear  -- ENT: MMM; no mucosal or nasal lesions; no congestion or rhinorrhea; no posterior pharynx erythema; mild TTP bilateral parotid glands without palpable fullness - more chronic per patient  -- NECK: supple; no palpable lymphadenopathy  -- CARDIO: RRR; no g/r/m  -- PULM: clear to auscultation bilaterally without crackles, wheezing, or coarse breath sounds  -- SKIN: mild blanching erythema across cheeks without crossing nasal bridge - chronic/stable; fair complexion  -- NEURO: alert; oriented; moving all extremities    MSK:  -- ROM: full ROM bilateral shoulders, elbows, wrists, UE digits, knees, ankles  -- No TTP, swelling, or erythema of bilateral shoulders, elbows, wrists, UE digits, knees, ankles  -- +Tinels testing left palm

## 2020-03-23 ENCOUNTER — Ambulatory Visit
Admit: 2020-03-23 | Discharge: 2020-03-24 | Payer: PRIVATE HEALTH INSURANCE | Attending: Orthopaedic Surgery | Primary: Orthopaedic Surgery

## 2020-03-23 DIAGNOSIS — G5602 Carpal tunnel syndrome, left upper limb: Principal | ICD-10-CM

## 2020-03-23 NOTE — Unmapped (Signed)
INTERIM HISTORY:  Tonya Brady is a 44 year old female who underwent left carpal tunnel release on 12/30/18 after which her symptoms resolved. Over the last 2-3 months, she has began to experience left thenar numbness and pain, sometimes awakening her at night time. She describes these symptoms occurring along the lateral forearm extending to the distribution of the palmar cutaneous branch of median nerve. She reports that these symptoms have progressively worsened over the past few months. She reports weakness and dropping of objects frequently. No neck pain. She did have prior electrodiagnostic studies in 2009 done at [facility] which confirmed bilateral carpal tunnel syndrome.    ROS:  No fevers, chills.     PHYSICAL EXAM:  Left upper extremity warm and well-perfused. Incision has healed well and is nontender. Tinel's positive at the proximal forearm and wrist.  Compression over the median nerve at the pronator causes pain and numbness radiating distally. No thenar wasting. Sensation intact otherwise throughout. Grip strength 4/5. Opposition 4/5.     RADIOGRAPHS:  XR obtained 12/2017 revealed no evidence of CMC arthritis     ASSESSMENT:  44 year old female with history of prior left carpal tunnel release on 12/30/18; most recently with recurrent thenar pain (in distribution of palmar cutaneous branch of median nerve), potentially due to recurrent carpal tunnel syndrome versus pronator syndrome vs cervical radiculopathy.    PLAN:   -EMG/NCS to further characterize her symptoms  -Follow up after the above is obtained.

## 2020-03-23 NOTE — Unmapped (Signed)
Dr Jarold Motto has ordered an EMG test for you.  That department typically will call you to schedule.  If you don't receive a call from the EMG department within 48 hours please given them a call at [phone].    Once your EMG test is scheduled call Orthopaedics at [phone] (at the recording press the number 1) to schedule your follow up appointment to be with Dr Jarold Motto.  This needs to be at least 14 days after your EMG.

## 2020-03-30 ENCOUNTER — Ambulatory Visit: Admit: 2020-03-30 | Discharge: 2020-03-31 | Payer: PRIVATE HEALTH INSURANCE

## 2020-03-30 DIAGNOSIS — G5602 Carpal tunnel syndrome, left upper limb: Principal | ICD-10-CM

## 2020-04-01 NOTE — Unmapped (Signed)
[facility]  Clinical Neurophysiology Laboratory  [place], [place]         Patient: Tonya Brady Date of Birth: 11/01/1975  [facility] #: [id number] Handedness: Right  Sex: Female      Visit Date: 03/30/2020 12:54 PM  Age: 44 Years  Attending: Valeda Malm, MD   Resident/Fellow: Clayton Bibles, DO and Guadalupe Dawn, MD  Req Provider: Claudina Lick, MD  Current Height: 5 feet 0 inch  Chief complaint: Left hand numbness    History/Exam: The patient is a 44 year old woman with history of left carpal tunnel release in 12/2018 who presents for evaluation of new onset left thumb and forearm paresthesias.  This started 3 months ago with tingling in left thumb and has progressed to subjective weakness in the hand.   Pain is located over left thumb and forearm.  Does have neck pain.  Physical exam reveals 5/5 strength in both arms with diminished sensation over the left upper extremity to light touch.  2+ reflexes bilaterally. Hoffman's negative bilaterally.  Durkin's positive on the left.  Tinel's test negative at wrist and elbows bilaterally.       Motor NCS      Nerve / Sites Muscle Latency Ref. Amplitude Ref. Dur. Distance Lat Diff Velocity Ref.     ms ms mV mV ms cm ms m/s m/s   L Median - APB      Wrist APB 3.75 ?4.40 10.2 ?4.2 4.38 8         Elbow APB 7.65  9.0  4.21 23 3.90 59.0 ?49.0   L Ulnar - ADM      Wrist ADM 2.83 ?3.50 7.7 ?5.6 5.10 8         B.Elbow ADM 5.79  7.1  5.17 18 2.96 60.8 ?49.0      A.Elbow ADM 7.35  6.5  4.90 10 1.56 64.0 ?49.0       Sensory NCS      Nerve / Sites Rec. Site Peak Lat Ref. PP Amp Ref. Distance     ms ms ??V ??V cm   L Median - Dig II (Antidromic)      Wrist Index 4.06 ?3.80 24.4 ?10.0 14   L Ulnar - Dig V (Antidromic)      Wrist Dig V 3.42 ?3.80 22.0 ?10.0 14       EMG Summary Table     Spontaneous MUP Recruitment Comment   Muscle Nerve Roots Fib PSW Fasc Other Amp Dur. PPP Pattern Other   L. Abductor pollicis brevis Median C8-T1 None None None None N N N Normal None   L. First dorsal interosseous Ulnar C8-T1 None None None None N N N Normal None   L. Pronator teres Median C6-C7 None None None None N N N Normal None   L. Triceps brachii Radial C6-C8 None None None None N N N Normal None   L. Deltoid (posterior) Axillary C5-C6 None None None None N N N Normal None   L. Cervical paraspinals Spinal C4-C8 None None None None N N N Normal None       Clinical Impression:    Review of referring physician???s records was performed.     Specific Questions:    Evaluate left hand numbness    Summary:  The patient was maintained at temperature over 32 degrees.  Left median motor NCS are normal.  Left median sensory NCS demonstrates prolonged peak latency at the wrist.  Left ulnar motor NCS are normal.  Left  ulnar sensory NCS are normal.    EMG evaluation was performed in the more symptomatic left upper limb.  All muscles tested are normal as noted in the table above.    Sonographic evaluation was performed on the left median nerve. The median nerve measures 5 mm2 in the forearm with normal morphology as it traverses distally. The nerve measures 11 mm2 at the wrist and 11 mm2 at the axilla, which are normal values. The nerve is 29mm2 at the level of the pronator teres without any signs of impingement. Mobility, vasculature, and carpal bones are normal. There were no anomalies visualized. The wrist to forearm ratio is 2:1.    Conclusions:  This is an abnormal study.    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 There is no sonographic evidence of a proximal median mononeuropathy.     - - - - - - - - - - - - -   Resident(s)    Guadalupe Dawn, MD PGY-III  Clayton Bibles, DO PGY-IV    - - - - - - - - - - - - Attending Electromyographer  Valeda Malm, MD

## 2020-04-06 ENCOUNTER — Ambulatory Visit
Admit: 2020-04-06 | Discharge: 2020-04-07 | Payer: PRIVATE HEALTH INSURANCE | Attending: Orthopaedic Surgery | Primary: Orthopaedic Surgery

## 2020-04-06 DIAGNOSIS — G5602 Carpal tunnel syndrome, left upper limb: Principal | ICD-10-CM

## 2020-04-06 MED ADMIN — triamcinolone acetonide (KENALOG-40) injection 40 mg: 40 mg | INTRA_ARTICULAR | @ 17:00:00 | Stop: 2020-04-06

## 2020-04-06 NOTE — Unmapped (Signed)
You received a corticosteroid injection to reduce pain and inflammation.  Please note that it can take up to 2 weeks for this injection to fully work.  While many people will feel relief sooner, please be patient.    The injection contained a corticosteroid. You may have increased pain until the steroid has a chance to work, usually lasting no more than 1-2 days. You may take over the counter pain medication (Tylenol/ibuprofen/etc) if not otherwise contraindicated. You may also ice the area.    What are some of the possible side effects of a steroid injection?    Common side effects:  temporarily elevated blood sugar (in diabetic patients) that can last a few days   flushing of the skin, especially the face  temporary rise in blood pressure  discoloration or atrophy of the skin at the injection site    Call your doctor at once if you have:  persistent worsening pain or swelling, fever;  blurred vision, tunnel vision, eye pain, or seeing halos around lights;  fast or slow heartbeats;  increased blood pressure that is associated with severe headache, blurred vision, pounding in your neck or ears, anxiety, nosebleed;  headaches, ringing in your ears, dizziness, nausea, vision problems, pain behind your eyes    This is not a complete list of side effects and others may occur. Call your doctor for medical advice about side effects.     What other drugs may be affected after the injection?  Many drugs can interact with steroids. Not all possible interactions are listed here. Tell your doctor about all your current medicines and any you start or stop using, especially:  an antibiotic or antifungal medication;  birth control pills or hormone replacement therapy;  a blood thinner (warfarin, Coumadin, and others);  a diuretic or water pill;  insulin or oral diabetes medicine;  medicine to treat tuberculosis;  a nonsteroidal anti-inflammatory drug or NSAID (aspirin, ibuprofen, naproxen, diclofenac, indomethacin, Advil, Aleve, Celebrex, and many others); or  seizure medication.      You can resume your normal daily activities, but consider resting the injected area for the next few days.

## 2020-04-06 NOTE — Unmapped (Signed)
Received a notification from the insurance stating that the pts Ozempic was not covered. They would like the pt to try any of the following first.    Metformin HCL ER Tabs 500MG   Metformin HCL ER Tabs 750MG   ??BYETTA INJECT/PEN .  ??BYETTA INJECT/PEN .  TRULICITY SD PEN 0.5ML 4'S 0.75MG  TRULICITY SD PEN 0.5ML 4'S 1.5MG   BYDUREON BCISE AUTOINJECTR 0.2MG  TRULICITY SD PEN 0.5ML 4'S 3MG      Forwarding to providers.

## 2020-04-06 NOTE — Unmapped (Signed)
INTERIM HISTORY:  Tonya Brady is a 44 year old female who underwent left carpal tunnel release on 12/30/18 after which her symptoms resolved. Over the last 3-4 months, she has began to experience left thenar numbness and pain, sometimes awakening her at night time. She describes these symptoms occurring along the lateral forearm extending to the distribution of the palmar cutaneous branch of median nerve. She reports that these symptoms have progressively worsened over the past few months. She reports weakness and dropping of objects frequently.  They are similar to though not identical to her previous carpal tunnel symptoms. No neck pain. She did have prior electrodiagnostic studies in 2009 done at [facility] which confirmed bilateral carpal tunnel syndrome.  At her last visit with me I had recommended repeat electrical studies.  She had these done on 03/30/2020.  They showed evidence of some mild residual carpal tunnel syndrome consistent with her history of previous carpal tunnel release.  There was no ultrasound evidence of a proximal median nerve compression.    ROS:  No fevers, chills.     PHYSICAL EXAM:  Left upper extremity warm and well-perfused. Incision has healed well and is nontender. Tinel's positive at the proximal forearm and wrist.  Compression over the median nerve at the pronator causes pain and numbness radiating distally. No thenar wasting. Sensation intact otherwise throughout. Grip strength 4/5. Opposition 4/5.     RADIOGRAPHS:  XR obtained 12/2017 revealed no evidence of CMC arthritis     ASSESSMENT:  44 year old female with history of prior left carpal tunnel release on 12/30/18; most recently with recurrent thenar pain (in distribution of palmar cutaneous branch of median nerve), potentially due to recurrent carpal tunnel syndrome versus pronator syndrome vs cervical radiculopathy.    PLAN:   I reviewed the results of her electrical studies with her.  These were done on 03/30/2020.  They show some mild residual left carpal tunnel syndrome with no other abnormalities noted.  Based on her symptomatology and her exam I am not convinced that her symptoms are due to recurrent carpal tunnel syndrome.  I am concerned she may have some proximal median nerve compression in the forearm or perhaps even more proximally at the level of the cervical spine.  I would not recommend repeat carpal tunnel release based upon her electrical studies and physical exam findings.  I think would be reasonable to proceed with a carpal tunnel injection for both diagnostic and therapeutic purposes.  She was amenable to this.  This was done in clinic without difficulty.  She will reach out to [place] in a couple weeks to let us know how she is doing.  If her symptoms have not improved our next step would be to have her see one of our nonoperative sports providers for an ultrasound-guided injection of the median nerve in the proximal forearm.    Carpal tunnel injection: After informed consent was obtained and appropriate timeout was performed, the patient's left carpal tunnel was injected under sterile conditions using 1 mL of 40 mg/mL of Kenalog.  The patient tolerated the injection well.

## 2020-04-08 NOTE — Unmapped (Signed)
[facility] Clinical Assessment & Refill Coordination Note    Tonya Brady, DOB: [birth year]  Phone: [phone] (home)     All above HIPAA information was verified with patient.     Was a Nurse, learning disability used for this call? No    Specialty Medication(s):   Inflammatory Disorders: Benlysta     Current Outpatient Medications   Medication Sig Dispense Refill   ??? albuterol HFA 90 mcg/actuation inhaler INHALE 2 PUFFS BY MOUTH EVERY 6 HOURS AS NEEDED FOR WHEEZING 6.7 g 0   ??? belimumab (BENLYSTA) 200 mg/mL AtIn Inject the contents of 1 syringe (200 mg) under the skin every seven (7) days. 4 mL 2   ??? buPROPion (WELLBUTRIN SR) 150 MG 12 hr tablet TAKE 1 TABLET(150 MG) BY MOUTH TWICE DAILY 120 tablet 1   ??? diclofenac sodium (VOLTAREN) 1 % gel Apply 2 g topically Three (3) times a day as needed for arthritis or pain. 100 g 0   ??? famotidine (PEPCID) 40 MG tablet TAKE 1 TABLET(40 MG) BY MOUTH EVERY NIGHT AS NEEDED FOR HEARTBURN 90 tablet 3   ??? fluticasone propionate (FLONASE) 50 mcg/actuation nasal spray 1 spray into each nostril daily. 16 g 11   ??? hydrOXYchloroQUINE (PLAQUENIL) 200 mg tablet Take 200 mg by mouth daily.     ??? ibuprofen (MOTRIN) 800 MG tablet ibuprofen 800 mg tablet     ??? inhalational spacing device (AEROCHAMBER MV) Spcr 1 each by Miscellaneous route every six (6) hours as needed. 1 each 0   ??? loratadine (CLARITIN) 10 mg tablet Take 10 mg by mouth daily. Takes as needed     ??? melatonin 1 mg Tab tablet Take by mouth nightly.     ??? naltrexone (DEPADE) 50 mg tablet Take 0.5 tablets (25 mg total) by mouth two (2) times a day. 60 tablet 2   ??? semaglutide (OZEMPIC) 0.25 mg or 0.5 mg(2 mg/1.5 mL) PnIj injection Inject 0.25 mg under the skin every seven (7) days. 1.5 mL 1     No current facility-administered medications for this visit.        Changes to medications: Tonya Brady reports no changes at this time.    Allergies   Allergen Reactions   ??? Aspirin Swelling     Other reaction(s): SWELLING/EDEMA   ??? Fish Containing Products Diarrhea and Nausea And Vomiting   ??? Iodine Other (See Comments)     Pt told not to have d/t shellfish allergy   ??? Shellfish Containing Products Hives, Diarrhea and Nausea Only     Other reaction(s): HIVES  Other reaction(s): HIVES  Other reaction(s): NAUSEA       Changes to allergies: No    SPECIALTY MEDICATION ADHERENCE     Benlysta 200mg /ml: 7 days of medicine on hand     Medication Adherence    Patient reported X missed doses in the last month: 0  Specialty Medication: Benlysta 200mg /ml          Specialty medication(s) dose(s) confirmed: Regimen is correct and unchanged.     Are there any concerns with adherence? No    Adherence counseling provided? Not needed    CLINICAL MANAGEMENT AND INTERVENTION      Clinical Benefit Assessment:    Do you feel the medicine is effective or helping your condition? Yes    Clinical Benefit counseling provided? Not needed    Adverse Effects Assessment:    Are you experiencing any side effects? No    Are  you experiencing difficulty administering your medicine? No    Quality of Life Assessment:    Rheumatology:   Quality of Life    On a scale of 1 ??? 10 with 1 representing not at all and 10 representing completely ??? how has your rheumatologic condition affected your:  Daily pain level?: decline to answer  Ability to complete your regular daily tasks (prepare meals, get dressed, etc.)?: decline to answer  Ability to participate in social or family activities?: decline to answer         Have you discussed this with your provider? Not needed    Therapy Appropriateness:    Is therapy appropriate? Yes, therapy is appropriate and should be continued    DISEASE/MEDICATION-SPECIFIC INFORMATION      For patients on injectable medications: Patient currently has 1 doses left.  Next injection is scheduled for 04/08/2020.    PATIENT SPECIFIC NEEDS     - Does the patient have any physical, cognitive, or cultural barriers? No    - Is the patient high risk? No    - Does the patient require a Care Management Plan? No     - Does the patient require physician intervention or other additional services (i.e. nutrition, smoking cessation, social work)? No      SHIPPING     Specialty Medication(s) to be Shipped:   Inflammatory Disorders: Benlysta 200mg /ml    Other medication(s) to be shipped: No additional medications requested for fill at this time     Changes to insurance: No    Delivery Scheduled: Yes, Expected medication delivery date: 04/12/2020.     Medication will be delivered via Same Day Courier to the confirmed prescription address in [facility].    The patient will receive a drug information handout for each medication shipped and additional FDA Medication Guides as required.  Verified that patient has previously received a Conservation officer, historic buildings.    All of the patient's questions and concerns have been addressed.    [name]   [facility] Specialty Pharmacist

## 2020-04-09 NOTE — Unmapped (Signed)
I have resubmitted the prior auth to include the updated information regarding her inability to use other medications.

## 2020-04-12 MED FILL — BENLYSTA 200 MG/ML SUBCUTANEOUS AUTO-INJECTOR: SUBCUTANEOUS | 28 days supply | Qty: 4 | Fill #2

## 2020-04-12 MED FILL — BENLYSTA 200 MG/ML SUBCUTANEOUS AUTO-INJECTOR: 28 days supply | Qty: 4 | Fill #2 | Status: AC

## 2020-04-12 MED FILL — EMPTY CONTAINER: 120 days supply | Qty: 1 | Fill #0

## 2020-04-12 MED FILL — EMPTY CONTAINER: 120 days supply | Qty: 1 | Fill #0 | Status: AC

## 2020-04-19 NOTE — Unmapped (Deleted)
Nutrition Collaborative Consult Note      Present height    Present weight      For Adult Patients:  Current BMI        Weight History:  Wt Readings from Last 3 Encounters:   03/22/20 88.8 kg (195 lb 12.8 oz)   03/03/20 87.5 kg (193 lb)   01/07/20 87.1 kg (192 lb)         Physical Activity:  Reading at the gym  - Loves the gym   - 2 weeks wasn't at the gym because house work   [employer]     - can't wait to get back to walking   - was able to run three times while at the gym   ***      24-Hour recall/usual intake:    Time Intake   Breakfast watermelon   Protein Shake     Yogurt   ***       Snack (AM)    Lunch 6 grilled nuggets  Celery   BBQ     Protein Shake and Banana   ***     Snack (PM)    Dinner [employer]   1/2 cheese burger   1/2 fries  Diet coke     Pork Tendorlion   Salad   ***       Snack (HS)      Other Nutrition Information:  HISTORY  Notes that recent weight high was 212 lbs    Cirosis of the liver - 3 years ago   Working ball and eating out more and eating late   Things have been stressful   Had a week of vacation   ***    USUAL INTAKES two-three meals   Watching noodles- choosing wheat   - reduced meat intaek   - reducing portions   ***    SNACKING Limited snacking at this time   Having fewer packaged snacks  1/2 banana   Nuts and fruit packets   Yogurt   ***    GROCERY SHOPPING 1-2 times a week   -8-9 people in the house  -- has started keeping things out of the house including chips   -- keeping some things out of the house   ***    COOKING   -- one time a day   -- notes that her daughter has been working on making healthy changes aslo  ***    DINING OUT 1x per week   Pizza    Eating out more recently   [employer]   -- notes she is only having diet coke some days  Sparkling water   - son also does sparkling water   - bottles of water   ***    INTOLERANCES Spaghetti sauce   ***    SLEEP 6 is doing well   ??***    STRESS Very elevated   -- reports that the Wellbutrin is improving her stress   ***    Patient arrives for assistance with weight loss.  Reviewed dietary recall and physical activity.  Set goals for next appointment.   ***    Estimated Daily Needs:  1510 calories (Mifflin-St Jeor equation for BMI > 25 using actual body weight, adjusted for weight loss)  72 g protein  ***    Nutrition Intervention:  Nutrition Counseling  ***    Materials Provided:  N/A    Social Determinants of Health screened today. Interventions  provided: N/A - no concerns identified.     Patient Stated Nutrition Goals:  1) Practice distraction when stress are elevated, daily                Read              To be reviewed at next appointment   ***    2) Choose fruits and vegetables - as snacks- daily              Purchase sweets in single serving containers               To be reviewed at next appointment   Add a cheese stick or nuts to reduce hunger at lunch   ??***    3) Have 1 diet soda a day               Have water and lemon instead               Unsweet tea with lemon   ***      Goals Added to Visit Navigator?  Yes    Follow-up:  50, same day as primary care visit    Length of visit was ***   minutes  0 unit(s) billed per insurance  ***    Rita Ohara MS RD CDE

## 2020-04-20 NOTE — Unmapped (Deleted)
Following with [facility]. Getting [place] q86mo for tumor surveillance as well as LFTs. Hep serologies initially negative and A/B vaccine series completed 2019. Will continue to work on aggressive weight loss.

## 2020-04-20 NOTE — Unmapped (Deleted)
[facility] Follow Up    Assessment/Plan:     No chief complaint on file.      Problem List Items Addressed This Visit        Digestive    Liver cirrhosis secondary to NASH (nonalcoholic steatohepatitis) (CMS-HCC) - Primary     Following with [facility]. Getting [place] q17mo for tumor surveillance as well as LFTs. Hep serologies initially negative and A/B vaccine series completed 2019. Will continue to work on aggressive weight loss.             Other    Class 2 obesity     Tonya Brady is a 44 y.o. 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 Her medical history includes SLE, cirrhosis, GERD, suspected OSA, depression, and emotional / stress eating. Pt wishes weight loss to prevent further liver damage.    Weight Summary:  1. Starting weight/BMI: 209 lb, 5' 1 -- BMI 39.6 (06/13/19 self reported)  2. Target weight/goal: 160 lb.  3. Today's weight/BMI: 192 lb, BMI 36.3 (04/20/20)  4. % Body weight loss: 8.1%  5. Waist Circumference: 38.75  (04/20/20)  6. Today's visit number: 7th  7. Duration in program: 10 mo    Referred by: [name], MD in [facility].    GOALS: Reduce soda, try to find carbonated water alternative. Continue regular exercise and reduced sugar intake.    I have reviewed the patient's medical history, lifestyle history and labs/tests.   My recommendations include the following:    Diet quality and patterning: Pt continues to drink 3 diet sodas per week but NO regular soda! Pt is working on finding carbonated waters that she likes. Endorses healthy home cooked meals.  -- Will meet regularly with our nutritionist during the duration of medical management treatment. Discussed about healthier food choices and advised to home cook for less processed meals.    Exercise: Pt is walking outside in the morning. Continues to do regular gym workouts (both cardio and strength training) 3 times a week. 2-2.5 hr each.   -- Goal of 30 minutes of exercise 5x a week or a total of 150 minutes weekly.    Sleep: Currently sleeping 6 hours without sleep aids. +OSA screening (STOP-BANG 3/8). Pt declined to do sleep study for now and wishes to focus on lifestyle changes.  -- Recommend 7-8 hours of sleep at night. Advised to try melatonin 10 mg for insomnia.     Stress management: Current stressors: domestic, health, world news, finance. She has an established therapist.  -- Recommend to try relaxation techniques such as breathing techniques, listening to music to help sleep at night and yoga stretching throughout the day to help control stress.     Weight gain causing medication: None.   Hx of long term steroid use (prednisone 80 mg daily for lupus).  Hx of Ambien for insomnia years ago.    Medication: Taking medications as instructed. No s/e. Dr Sherryll Burger approved Wellbutrin 300mg  per day (MAX) as her cirrhosis is well compensated. Pt follows closely with hepatology. We decided to try Ozempic to increase weight loss. Instruction given; side effect profile discussed. Saxenda prescription and Naltrexone refill sent.   CONTINUE Wellbutrin 300 mg BID, Naltrexone 25 mg BID. START Saxenda 0.6 mg weekly.  - Wellbutrin: pt was started on this for depression before enrolling at our office. ?weight loss on it.  - Naltrexone: started on 06/13/19 at  209 lb.  Tried  - Orlistat: tried and failed.  Contraindicated  - Topamax: contraindicated dhx of kidney stone.  - Phentermine: contraindicated by hx of palpitations.    Obesity Surgery: Pt is interested in this option. However pt's BMI is 36 and rcurently losing 2 lb/wk with good response to medication and healthy lifestyle changes so we will revisit this option if pt gets on the plateau or gains weight above the BMI 35. (updated 04/19/2020)    Medical conditions:  Glaucoma: no  Seizures: no  Medullary thyroid cancer (personal or family hx): Father and PGM both had thyroid goiter. Multiple Endocrine Neoplasia: no  Palpitations/Tachycardia: YES  Chest Pain: no past history of MI.   Headaches/Migraines: YES  Nephrolithiasis: YES  H/o pancreatitis: YES, gallstone pancreatitis and she had cholecystectomy.  GERD: YES    Prior Surgeries:  Choleycystectomy: YES  Hysterectomy: YES    Obesity-associated comorbidities: Cirrhosis, GERD, suspected sleep apnea.  Current barriers include: Anxiety, depression, Sjogren's, lupus, emotional /stress eating, insomnia, fibromyalgia.  Birth Control Methods: N/A; s/p hysterectomy.                No follow-ups on file.    I have reviewed and addressed the patient???s adherence and response to prescribed medications. I have identified patient barriers to following the proposed medication and treatment plan, and have noted opportunities to optimize healthy behaviors. I have answered the patient???s questions to satisfaction and the patient voices understanding.    Time: Greater than 50% of this encounter was spent in direct consultation with the patient in evaluation and discussing all of the above. Duration of encounter: 25 minutes.     HPI:     Tonya Brady is a 44 y.o. year old female who has a  has a past medical history of Bulging lumbar disc (2005), Cirrhosis of liver (CMS-HCC), Depression, Diabetes mellitus (CMS-HCC), Difficult intravenous access, Fibromyalgia (03/09/2013), NASH (nonalcoholic steatohepatitis), Secondary Sjogren's syndrome (CMS-HCC) (03/09/2013), Systemic lupus erythematosus (CMS-HCC) (01/19/2011), and Valvular regurgitation. who presents today for [facility] follow up.    Weight Management History    Wt Readings from Last 6 Encounters:   03/22/20 88.8 kg (195 lb 12.8 oz)   03/03/20 87.5 kg (193 lb)   01/07/20 87.1 kg (192 lb)   01/06/20 87.1 kg (192 lb)   11/05/19 90.7 kg (200 lb)   11/04/19 90.8 kg (200 lb 1.9 oz)     Update: Pt has lost 8 lbs in the last 8 weeks (1 lb/wk).  Medication: Pt is tolerating wellbutrin and naltrexone well. Denies s/e. Pt is still experiencing sweet cravings.   Diet: Pt has totally cut out regular soda consumption. Drinks ~3 diet sodas per week. Is actively seeking a carbonated water alternative that does not have artificial sweeteners.   Exercise: Pt continues to exercise regularly both aerobic and resistance training.   Barrier: Time.     Lab Results   Component Value Date    LDL Calculated 101 (H) 03/03/2020    LDL Cholesterol, Calculated 104 06/23/2011    Hemoglobin A1C 5.4 03/03/2020    Hemoglobin A1C 5.9 02/10/2016    Hemoglobin A1C 5.5 06/23/2011    TSH 2.500 11/10/2015    TSH 1.92 06/23/2011     Past Medical/Surgical History:     Past Medical History:   Diagnosis Date   ??? Bulging lumbar disc 2005   ??? Cirrhosis of liver (CMS-HCC)    ??? Depression    ??? Diabetes mellitus (CMS-HCC)    ???  Difficult intravenous access    ??? Fibromyalgia 03/09/2013   ??? NASH (nonalcoholic steatohepatitis)    ??? Secondary Sjogren's syndrome (CMS-HCC) 03/09/2013    Secondary to SLE  Diagnosed: 12/2003 (by sx's, parotid gland enlargement, and serologies) Pathology: biopsy not done Labs: + ANA, anti-SSA, anti-SSB, RF    ??? Systemic lupus erythematosus (CMS-HCC) 01/19/2011   ??? Valvular regurgitation      Past Surgical History:   Procedure Laterality Date   ??? CARPAL TUNNEL RELEASE     ??? CESAREAN SECTION     ??? CHOLECYSTECTOMY     ??? HERNIA REPAIR     ??? HYSTERECTOMY     ??? PR REVISE MEDIAN N/CARPAL TUNNEL SURG Left 12/30/2018    Procedure: R16 NEUROPLASTY AND/OR TRANSPOSITION; MEDIAN NERVE AT CARPAL TUNNEL;  Surgeon: Theodora Blow Jacqlyn Krauss, MD;  Location: ASC [facility];  Service: Orthopedics   ??? PR SALIVARY SURG UNLISTED PROC Right 12/06/2018    Procedure: R22 UNLISTED PROC SALIVARY GLANDS/DUCTS;  Surgeon: Michelle Piper, MD;  Location: ASC OR [facility];  Service: ENT   ??? PR SALIVARY SURG UNLISTED PROC Left 02/18/2019    Procedure: R25 UNLISTED PROC SALIVARY GLANDS/DUCTS;  Surgeon: Michelle Piper, MD;  Location: ASC OR [facility];  Service: ENT   ??? PR UPPER GI ENDOSCOPY,DIAGNOSIS N/A 05/10/2016    Procedure: UGI ENDO, INCLUDE ESOPHAGUS, STOMACH, & DUODENUM &/OR JEJUNUM; DX W/WO COLLECTION SPECIMN, BY BRUSH OR WASH;  Surgeon: Carmon Ginsberg, MD;  Location: GI [facility]URES [facility];  Service: Gastroenterology   ??? PR UPPER GI ENDOSCOPY,DIAGNOSIS N/A 05/02/2019    Procedure: UGI ENDO, INCLUDE ESOPHAGUS, STOMACH, & DUODENUM &/OR JEJUNUM; DX W/WO COLLECTION SPECIMN, BY BRUSH OR WASH;  Surgeon: Chriss Driver, MD;  Location: GI [facility]URES [facility];  Service: Gastroenterology   ??? TOTAL ABDOMINAL HYSTERECTOMY     ??? TUBAL LIGATION         Social History:     Social History     Socioeconomic History   ??? Marital status: Married     Spouse name: Not on file   ??? Number of children: Not on file   ??? Years of education: Not on file   ??? Highest education level: Not on file   Occupational History   ??? Not on file   Tobacco Use   ??? Smoking status: Never Smoker   ??? Smokeless tobacco: Never Used   Vaping Use   ??? Vaping Use: Never used   Substance and Sexual Activity   ??? Alcohol use: No     Alcohol/week: 0.0 standard drinks   ??? Drug use: No   ??? Sexual activity: Not Currently     Partners: Male   Other Topics Concern   ??? Do you use sunscreen? Yes   ??? Tanning bed use? No   ??? Are you easily burned? No   ??? Excessive sun exposure? No   ??? Blistering sunburns? No   Social History Narrative   ??? Not on file     Social Determinants of Health     Financial Resource Strain: Low Risk    ??? Difficulty of Paying Living Expenses: Not hard at all   Food Insecurity: No Food Insecurity   ??? Worried About Programme researcher, broadcasting/film/video in the Last Year: Never true   ??? Ran Out of Food in the Last Year: Never true   Transportation Needs: No Transportation Needs   ??? Lack of Transportation (Medical): No   ??? Lack of Transportation (Non-Medical):  No   Physical Activity: Insufficiently Active   ??? Days of Exercise per Week: 3 days   ??? Minutes of Exercise per Session: 30 min   Stress: Stress Concern Present   ??? Feeling of Stress : To some extent   Social Connections: Unknown   ??? Frequency of Communication with Friends and Family: Three times a week   ??? Frequency of Social Gatherings with Friends and Family: Three times a week   ??? Attends Religious Services: Never   ??? Active Member of Clubs or Organizations: No   ??? Attends Banker Meetings: Never   ??? Marital Status: Not on file       Family History:     Family History   Problem Relation Age of Onset   ??? Cancer Mother    ??? Hypertension Mother    ??? Heart disease Father    ??? Heart attack Father    ??? Depression Son    ??? Mental illness Son    ??? Aneurysm Maternal Aunt    ??? Diabetes Maternal Aunt    ??? Diabetes Maternal Grandmother    ??? Hypertension Maternal Grandmother    ??? Thyroid disease Maternal Grandmother    ??? Breast cancer Maternal Grandmother    ??? Breast cancer Paternal Grandmother    ??? Diabetes Paternal Grandmother    ??? Thyroid disease Paternal Grandmother    ??? Diabetes Maternal Uncle    ??? Heart attack Maternal Uncle    ??? Melanoma Neg Hx    ??? Basal cell carcinoma Neg Hx    ??? Squamous cell carcinoma Neg Hx    ??? Drug abuse Neg Hx    ??? Stroke Neg Hx        Allergies:     Aspirin, Fish containing products, Iodine, and Shellfish containing products    Current Medications:     Current Outpatient Medications   Medication Sig Dispense Refill   ??? albuterol HFA 90 mcg/actuation inhaler INHALE 2 PUFFS BY MOUTH EVERY 6 HOURS AS NEEDED FOR WHEEZING 6.7 g 0   ??? belimumab (BENLYSTA) 200 mg/mL AtIn Inject the contents of 1 injector (200mg ) under the skin every 7 days 4 mL 2   ??? buPROPion (WELLBUTRIN SR) 150 MG 12 hr tablet TAKE 1 TABLET(150 MG) BY MOUTH TWICE DAILY 120 tablet 1   ??? diclofenac sodium (VOLTAREN) 1 % gel Apply 2 g topically Three (3) times a day as needed for arthritis or pain. 100 g 0   ??? empty container Misc Use as directed 1 each 2   ??? famotidine (PEPCID) 40 MG tablet TAKE 1 TABLET(40 MG) BY MOUTH EVERY NIGHT AS NEEDED FOR HEARTBURN 90 tablet 3   ??? fluticasone propionate (FLONASE) 50 mcg/actuation nasal spray 1 spray into each nostril daily. 16 g 11   ??? hydrOXYchloroQUINE (PLAQUENIL) 200 mg tablet Take 200 mg by mouth daily.     ??? ibuprofen (MOTRIN) 800 MG tablet ibuprofen 800 mg tablet     ??? inhalational spacing device (AEROCHAMBER MV) Spcr 1 each by Miscellaneous route every six (6) hours as needed. 1 each 0   ??? loratadine (CLARITIN) 10 mg tablet Take 10 mg by mouth daily. Takes as needed     ??? melatonin 1 mg Tab tablet Take by mouth nightly.     ??? naltrexone (DEPADE) 50 mg tablet Take 0.5 tablets (25 mg total) by mouth two (2) times a day. 60 tablet 2   ??? semaglutide (OZEMPIC) 0.25 mg or 0.5  mg(2 mg/1.5 mL) PnIj injection Inject 0.25 mg under the skin every seven (7) days. 1.5 mL 1     No current facility-administered medications for this visit.       I have reviewed and (if needed) updated the patient's problem list, medications, allergies, past medical and surgical history, social and family history.    ROS:     Unless otherwise stated in the HPI:  CONSTITUTIONAL: no fever chills.   HEENT: Eyes: No diplopia or blurred vision. ENT: No earache, sore throat or runny nose.   CARDIOVASCULAR: No pressure, squeezing, strangling, tightness, heaviness or aching about the chest, neck, axilla or epigastrium.   RESPIRATORY: No cough, shortness of breath, PND or orthopnea.   GASTROINTESTINAL: No nausea, vomiting or diarrhea.   GENITOURINARY: No dysuria, frequency or urgency.   MUSCULOSKELETAL: no new pains, no joint swelling or redness.   SKIN: No change in skin, hair or nails.   NEUROLOGIC: No paresthesias, fasciculations, seizures or weakness.   PSYCHIATRIC: No disorder of thought or mood.   ENDOCRINE: No heat or cold intolerance, polyuria or polydipsia.   HEMATOLOGICAL: No easy bruising or bleeding.    Vital Signs:     Wt Readings from Last 3 Encounters:   03/22/20 88.8 kg (195 lb 12.8 oz)   03/03/20 87.5 kg (193 lb)   01/07/20 87.1 kg (192 lb)     Temp Readings from Last 3 Encounters:   03/22/20 36.2 ??C (97.1 ??F) (Temporal)   03/03/20 36.4 ??C (97.5 ??F) (Temporal)   01/07/20 37.1 ??C (98.8 ??F) (Temporal)     BP Readings from Last 3 Encounters:   03/22/20 109/71   03/03/20 118/77   01/07/20 117/81     Pulse Readings from Last 3 Encounters:   03/22/20 60   03/03/20 99   01/07/20 60     There is no height or weight on file to calculate BMI.     Physical Exam:     General: well appearing, in NAD, There is no height or weight on file to calculate BMI. Ambulatory without help.  Body fat distribution: central & gluteofemoral adiposity. No supraclavicular adiposity. No dorsal adiposity.  Head: normocephalic atraumatic.  Eyes: PERRLA, EOMI, Sclera WNL.   Oral pharynx: moist, no exudate, no erythema, not enlarged. Good dental hygiene. Moderate OP crowding.  Neck: supple, no LAD, no thyromegaly, no bruit.  CV: RRR no rubs or murmurs. Peripheral edema: none.  Lungs: clear bilaterally to ascultation. No wheezing.  Abdomen: soft, NT/ND. No intertrigo. Moderate pannus.  Extremities: no clubbing, cyanosis. No edema.  Skin: no concerning lesions, rashes observed. No lipomas, no acanthosis nigricans.  Neuro: Alert and oriented X 3.    Labs:     No visits with results within 1 Month(s) from this visit.   Latest known visit with results is:   Office Visit on 03/03/2020   Component Date Value Ref Range Status   ??? Hemoglobin A1C 03/03/2020 5.4  4.8 - 5.6 % Final   ??? Estimated Average Glucose 03/03/2020 108  mg/dL Final   ??? Triglycerides 03/03/2020 75  0 - 150 mg/dL Final   ??? Cholesterol 03/03/2020 160  <=200 mg/dL Final   ??? HDL 29/56/2130 44  40 - 60 mg/dL Final   ??? LDL Calculated 03/03/2020 865* 40 - 99 mg/dL Final    NHLBI Recommended Ranges, LDL Cholesterol, for Adults (20+yrs) (ATPIII), mg/dL  Optimal              <784  Near Optimal  100-129  Borderline High     130-159  High                160-189  Very High            >=190  NHLBI Recommended Ranges, LDL Cholesterol, for Children (2-19 yrs), mg/dL  Desirable            <161  Borderline High     110-129  High                 >=130     ??? VLDL Cholesterol Cal 03/03/2020 15  9 - 37 mg/dL Final   ??? Chol/HDL Ratio 03/03/2020 3.6  1.0 - 4.5 Final   ??? Non-HDL Cholesterol 03/03/2020 116  70 - 130 mg/dL Final    Non-HDL Cholesterol Recommended Ranges (mg/dL)  Optimal       <096  Near Optimal 130 - 159  Borderline High 160 - 189  High             190 - 219  Very High       >220     ??? FASTING 03/03/2020 Unknown   Final   ??? Sodium 03/03/2020 143  135 - 145 mmol/L Final   ??? Potassium 03/03/2020 4.0  3.4 - 4.5 mmol/L Final   ??? Chloride 03/03/2020 108* 98 - 107 mmol/L Final   ??? Anion Gap 03/03/2020 8  5 - 14 mmol/L Final   ??? CO2 03/03/2020 26.9  20.0 - 31.0 mmol/L Final   ??? BUN 03/03/2020 10  9 - 23 mg/dL Final   ??? Creatinine 03/03/2020 0.85  0.55 - 1.02 mg/dL Final   ??? BUN/Creatinine Ratio 03/03/2020 12   Final   ??? EGFR CKD-EPI Non-African American,* 03/03/2020 85  >=60 mL/min/1.6m2 Final   ??? EGFR CKD-EPI African American, Fem* 03/03/2020 >90  >=60 mL/min/1.22m2 Final   ??? Glucose 03/03/2020 82  70 - 179 mg/dL Final   ??? Calcium 04/54/0981 9.6  8.7 - 10.4 mg/dL Final   ??? Albumin 19/14/7829 4.0  3.4 - 5.0 g/dL Final   ??? Total Protein 03/03/2020 7.5  5.7 - 8.2 g/dL Final   ??? Total Bilirubin 03/03/2020 0.3  0.3 - 1.2 mg/dL Final   ??? AST 56/21/3086 21  <=34 U/L Final   ??? ALT 03/03/2020 23  10 - 49 U/L Final   ??? Alkaline Phosphatase 03/03/2020 53  46 - 116 U/L Final       Follow-up:     Recommend well adult check/physical in 1 year. Otherwise, follow up as below.  No follow-ups on file.    Health maintenance reviewed and recommendations made based on [place] Preventative Task Force (USPTF) recommendations. Reviewed appropriate diet and exercise. Patient stated understanding and there were no barriers to learning.     I attest that I, Ann Bohne R Tyrease Vandeberg, personally documented this note while acting as scribe for [facility], MD.      Arlana Pouch, Scribe.  04/20/2020     The documentation recorded by the scribe accurately reflects the service I personally performed and the decisions made by me.    Marshell Garfinkel, MD

## 2020-04-20 NOTE — Unmapped (Deleted)
[name] is a 44 y.o. 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 Her medical history includes SLE, cirrhosis, GERD, suspected OSA, depression, and emotional / stress eating. Pt wishes weight loss to prevent further liver damage.    Weight Summary:  1. Starting weight/BMI: 209 lb, 5' 1 -- BMI 39.6 (06/13/19 self reported)  2. Target weight/goal: 160 lb.  3. Today's weight/BMI: 192 lb, BMI 36.3 (04/20/20)  4. % Body weight loss: 8.1%  5. Waist Circumference: 38.75  (04/20/20)  6. Today's visit number: 7th  7. Duration in program: 10 mo    Referred by: [name], MD in [facility].    GOALS: Reduce soda, try to find carbonated water alternative. Continue regular exercise and reduced sugar intake.    I have reviewed the patient's medical history, lifestyle history and labs/tests.   My recommendations include the following:    Diet quality and patterning: Pt continues to drink 3 diet sodas per week but NO regular soda! Pt is working on finding carbonated waters that she likes. Endorses healthy home cooked meals.  -- Will meet regularly with our nutritionist during the duration of medical management treatment. Discussed about healthier food choices and advised to home cook for less processed meals.    Exercise: Pt is walking outside in the morning. Continues to do regular gym workouts (both cardio and strength training) 3 times a week. 2-2.5 hr each.   -- Goal of 30 minutes of exercise 5x a week or a total of 150 minutes weekly.    Sleep: Currently sleeping 6 hours without sleep aids. +OSA screening (STOP-BANG 3/8). Pt declined to do sleep study for now and wishes to focus on lifestyle changes.  -- Recommend 7-8 hours of sleep at night. Advised to try melatonin 10 mg for insomnia.     Stress management: Current stressors: domestic, health, world news, finance. She has an established therapist.  -- Recommend to try relaxation techniques such as breathing techniques, listening to music to help sleep at night and yoga stretching throughout the day to help control stress.     Weight gain causing medication: None.   Hx of long term steroid use (prednisone 80 mg daily for lupus).  Hx of Ambien for insomnia years ago.    Medication: Taking medications as instructed. No s/e. Dr Sherryll Burger approved Wellbutrin 300mg  per day (MAX) as her cirrhosis is well compensated. Pt follows closely with hepatology. We decided to try Ozempic to increase weight loss. Instruction given; side effect profile discussed. Saxenda prescription and Naltrexone refill sent.   CONTINUE Wellbutrin 300 mg BID, Naltrexone 25 mg BID. START Saxenda 0.6 mg weekly.  - Wellbutrin: pt was started on this for depression before enrolling at our office. ?weight loss on it.  - Naltrexone: started on 06/13/19 at 209 lb.  Tried  - Orlistat: tried and failed.  Contraindicated  - Topamax: contraindicated dhx of kidney stone.  - Phentermine: contraindicated by hx of palpitations.    Obesity Surgery: Pt is interested in this option. However pt's BMI is 36 and rcurently losing 2 lb/wk with good response to medication and healthy lifestyle changes so we will revisit this option if pt gets on the plateau or gains weight above the BMI 35. (updated 04/19/2020)    Medical conditions:  Glaucoma: no  Seizures: no  Medullary thyroid cancer (personal or family hx): Father and PGM both had thyroid goiter. Multiple Endocrine Neoplasia: no  Palpitations/Tachycardia: YES  Chest Pain: no past history of MI.   Headaches/Migraines: YES  Nephrolithiasis: YES  H/o pancreatitis: YES, gallstone pancreatitis and she had cholecystectomy.  GERD: YES    Prior Surgeries:  Choleycystectomy: YES  Hysterectomy: YES    Obesity-associated comorbidities: Cirrhosis, GERD, suspected sleep apnea.  Current barriers include: Anxiety, depression, Sjogren's, lupus, emotional /stress eating, insomnia, fibromyalgia.  Birth Control Methods: N/A; s/p hysterectomy.

## 2020-04-27 NOTE — Unmapped (Deleted)
Nutrition Collaborative Consult Note      Present height    Present weight      For Adult Patients:  Current BMI        Weight History:  Wt Readings from Last 3 Encounters:   03/22/20 88.8 kg (195 lb 12.8 oz)   03/03/20 87.5 kg (193 lb)   01/07/20 87.1 kg (192 lb)         Physical Activity:  Reading at the gym  - Loves the gym   - 2 weeks wasn't at the gym because house work   [employer]     - can't wait to get back to walking   - was able to run three times while at the gym   ***      24-Hour recall/usual intake:    Time Intake   Breakfast watermelon   Protein Shake     Yogurt   ***       Snack (AM)    Lunch 6 grilled nuggets  Celery   BBQ     Protein Shake and Banana   ***     Snack (PM)    Dinner [employer]   1/2 cheese burger   1/2 fries  Diet coke     Pork Tendorlion   Salad   ***       Snack (HS)      Other Nutrition Information:  HISTORY  Notes that recent weight high was 212 lbs    Cirosis of the liver - 3 years ago   Working ball and eating out more and eating late   Things have been stressful   Had a week of vacation   ***    USUAL INTAKES two-three meals   Watching noodles- choosing wheat   - reduced meat intaek   - reducing portions   ***    SNACKING Limited snacking at this time   Having fewer packaged snacks  1/2 banana   Nuts and fruit packets   Yogurt   ***    GROCERY SHOPPING 1-2 times a week   -8-9 people in the house  -- has started keeping things out of the house including chips   -- keeping some things out of the house   ***    COOKING   -- one time a day   -- notes that her daughter has been working on making healthy changes aslo  ***    DINING OUT 1x per week   Pizza    Eating out more recently   [employer]   -- notes she is only having diet coke some days  Sparkling water   - son also does sparkling water   - bottles of water   ***    INTOLERANCES Spaghetti sauce   ***    SLEEP 6 is doing well   ??***    STRESS Very elevated   -- reports that the Wellbutrin is improving her stress   ***    Patient arrives for assistance with weight loss.  Reviewed dietary recall and physical activity.  Set goals for next appointment.   ***    Estimated Daily Needs:  1510 calories (Mifflin-St Jeor equation for BMI > 25 using actual body weight, adjusted for weight loss)  72 g protein  ***    Nutrition Intervention:  Nutrition Counseling  ***    Materials Provided:  N/A    Social Determinants of Health screened today. Interventions  provided: N/A - no concerns identified.     Patient Stated Nutrition Goals:  1) Practice distraction when stress are elevated, daily                Read              To be reviewed at next appointment   ***    2) Choose fruits and vegetables - as snacks- daily              Purchase sweets in single serving containers               To be reviewed at next appointment   Add a cheese stick or nuts to reduce hunger at lunch   ??***    3) Have 1 diet soda a day               Have water and lemon instead               Unsweet tea with lemon   ***      Goals Added to Visit Navigator?  Yes    Follow-up:  50, same day as primary care visit    Length of visit was ***   minutes  0 unit(s) billed per insurance  ***    Rita Ohara MS RD CDE

## 2020-05-05 ENCOUNTER — Ambulatory Visit: Admit: 2020-05-05 | Discharge: 2020-05-06 | Payer: PRIVATE HEALTH INSURANCE

## 2020-05-05 ENCOUNTER — Ambulatory Visit
Admit: 2020-05-05 | Discharge: 2020-05-06 | Payer: PRIVATE HEALTH INSURANCE | Attending: Internal Medicine | Primary: Internal Medicine

## 2020-05-05 DIAGNOSIS — M5412 Radiculopathy, cervical region: Principal | ICD-10-CM

## 2020-05-05 NOTE — Unmapped (Signed)
Thank you for coming to the [facility] and our clinic today!     We aim to provide you with the highest quality care.  If you need to schedule future appointments please call [phone].  If you need more direct help feel free to call our nurse line at [phone] or use MyChart to reach out to [place].    We look forward to seeing you again in the future and appreciate you coming to [facility].    Thank you.    Saunders Glance. Enis Slipper, MD            We provide innovative and comprehensive patient centered care that is supported by evidence-based research                                                                                                    COMING SOON!    Please check out our current research studies to see if you or someone you know may qualify at:    [email or web address]    .  MRI Scheduling and follow up instructions:    Your [facility] Provider has ordered an MRI test for you. The Radiology Department should call you directly in 2-3 business days to get this test scheduled. If you do not hear from the Radiology Department by this time, please feel free to call the scheduling line at [phone]. You will be called with the results of your MRI

## 2020-05-06 NOTE — Unmapped (Signed)
ORTHOPAEDIC RETURN CLINIC NOTE     ASSESSMENT:  Tonya Brady is a 44 y.o. female with:  1. Left cervical radiculopathy         PLAN:  The Spurling's maneuver in her neck was exquisitely positive for recreation of the numbness in her hand.  Her cervical x-rays reveal significant disc space loss and facet arthritis, possibly causing cervical impingement.  Recommended an MRI and possible guided injection in her neck   Follow Up: Return in about 2 weeks (around 05/19/2020) for F/U MRI.     No Procedures Performed      SUBJECTIVE:  Chief Complaint: Other (eval left forearm)       History of Present Illness:   Right Handed 44 y.o. female who presents with: Left hand numbness.  She had a carpal tunnel release last year.  Her symptoms were initially improved but returned.  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  Her persistent symptoms did not respond to a landmark guided steroid injection.  She is referred for an ultrasound-guided steroid injection.        All relevant orthopedic medical chart documents reviewed.    Changes to medical history since last clinic visit per patient questionnaire  None.   Surgical History   Past Surgical History:   Procedure Laterality Date   ??? CARPAL TUNNEL RELEASE     ??? CESAREAN SECTION     ??? CHOLECYSTECTOMY     ??? HERNIA REPAIR     ??? HYSTERECTOMY     ??? PR REVISE MEDIAN N/CARPAL TUNNEL SURG Left 12/30/2018    Procedure: R16 NEUROPLASTY AND/OR TRANSPOSITION; MEDIAN NERVE AT CARPAL TUNNEL;  Surgeon: Theodora Blow Jacqlyn Krauss, MD;  Location: [facility];  Service: Orthopedics   ??? PR SALIVARY SURG UNLISTED PROC Right 12/06/2018    Procedure: R22 UNLISTED PROC SALIVARY GLANDS/DUCTS;  Surgeon: Michelle Piper, MD;  Location: [facility] OR [facility];  Service: ENT   ??? PR SALIVARY SURG UNLISTED PROC Left 02/18/2019    Procedure: R25 UNLISTED PROC SALIVARY GLANDS/DUCTS;  Surgeon: Michelle Piper, MD;  Location: [facility] OR [facility];  Service: ENT   ??? PR UPPER GI ENDOSCOPY,DIAGNOSIS N/A 05/10/2016    Procedure: UGI ENDO, INCLUDE ESOPHAGUS, STOMACH, & DUODENUM &/OR JEJUNUM; DX W/WO COLLECTION SPECIMN, BY BRUSH OR WASH;  Surgeon: Carmon Ginsberg, MD;  [facility]: [facility];  Service: Gastroenterology   ??? PR UPPER GI ENDOSCOPY,DIAGNOSIS N/A 05/02/2019    Procedure: UGI ENDO, INCLUDE ESOPHAGUS, STOMACH, & DUODENUM &/OR JEJUNUM; DX W/WO COLLECTION SPECIMN, BY BRUSH OR WASH;  Surgeon: Chriss Driver, MD;  Location: [facility];  Service: Gastroenterology   ??? TOTAL ABDOMINAL HYSTERECTOMY     ??? TUBAL LIGATION        Medications   Current Outpatient Medications   Medication Sig Dispense Refill   ??? albuterol HFA 90 mcg/actuation inhaler INHALE 2 PUFFS BY MOUTH EVERY 6 HOURS AS NEEDED FOR WHEEZING 6.7 g 0   ??? belimumab (BENLYSTA) 200 mg/mL AtIn Inject the contents of 1 injector (200mg ) under the skin every 7 days 4 mL 2   ??? buPROPion (WELLBUTRIN SR) 150 MG 12 hr tablet TAKE 1 TABLET(150 MG) BY MOUTH TWICE DAILY 120 tablet 1   ??? diclofenac sodium (VOLTAREN) 1 % gel Apply 2 g topically Three (3) times a day as needed for arthritis or pain. 100 g 0   ??? empty container Misc Use as directed 1 each 2   ???  famotidine (PEPCID) 40 MG tablet TAKE 1 TABLET(40 MG) BY MOUTH EVERY NIGHT AS NEEDED FOR HEARTBURN 90 tablet 3   ??? fluticasone propionate (FLONASE) 50 mcg/actuation nasal spray 1 spray into each nostril daily. 16 g 11   ??? hydrOXYchloroQUINE (PLAQUENIL) 200 mg tablet Take 200 mg by mouth daily.     ??? ibuprofen (MOTRIN) 800 MG tablet ibuprofen 800 mg tablet     ??? inhalational spacing device (AEROCHAMBER MV) Spcr 1 each by Miscellaneous route every six (6) hours as needed. 1 each 0   ??? loratadine (CLARITIN) 10 mg tablet Take 10 mg by mouth daily. Takes as needed     ??? melatonin 1 mg Tab tablet Take by mouth nightly.     ??? naltrexone (DEPADE) 50 mg tablet Take 0.5 tablets (25 mg total) by mouth two (2) times a day. 60 tablet 2   ??? semaglutide (OZEMPIC) 0.25 mg or 0.5 mg(2 mg/1.5 mL) PnIj injection Inject 0.25 mg under the skin every seven (7) days. 1.5 mL 1     No current facility-administered medications for this visit.      Allergies   Aspirin, Fish containing products, Iodine, and Shellfish containing products       Review of Systems  .   Marland Kitchen         OBJECTIVE:  DETAILED PHYSICAL EXAM  General Appearance ?? well-nourished and no acute distress   Mood and Affect ?? alert, cooperative and pleasant   Gait and Station ?? Smooth, heel-toe, non-antalgic gait   Cardiovascular ?? well-perfused distally and no swelling   Skin ?? Normal.   lymph ?? No lymphedema or adenopathy noted   respiratory ?? Non labored breathing   Sensation ?? Sensation intact to light touch distally           Comprehensive Upper Extremity Orthopaedic Exam     General    Cervical Spine ROM Within normal limits     Inspection Left   Deformity No obvious deformity   General Appearance No edema, atrophy, dystrophic or dysvascular changes   Skin No obvious lesions, or open wounds   Nails Normal appearance   Signs of infection No obvious infection        No obvious atrophy.  Positive Tinel at carpal tunnel.  Negative Tinel in the proximal forearm.  Very positive Spurling    Test and Notes Review:    Imaging reviewed: Cervical x-rays performed today      This note was dictated using Scientist, clinical (histocompatibility and immunogenetics).  Please excuse any spelling or grammatical errors.      MEDICAL DECISION MAKING (level of service defined by 2/3 elements)     Number/Complexity of Problems Addressed 1 or more chronic illnesses with exacerbation, progression, or side effects of treatment (99204/99214)   Amount/Complexity of Data to be Reviewed/Analyzed 3 points: Review prior notes (1 point per unique source); Review test results (1 point per unique test); Order tests (1 point per unique test); Assessment requiring an independent historian (99204/99214)   Risk of Complications/Morbidity/Mortality of Management --LOW Risk of Morbidity from Additional Diagnostic Testing or Treatment (99203/99213)--     TIME     Total Time for E/M Services on the Date of Encounter 99213 - I personally spent 20-29 minutes face-to-face and non-face-to-face in the care of this patient, excluding time spent during separately reported procedures, but including all pre, intra, and post visit time on the date of service.

## 2020-05-11 ENCOUNTER — Ambulatory Visit: Admit: 2020-05-11 | Discharge: 2020-05-12 | Payer: PRIVATE HEALTH INSURANCE

## 2020-05-11 DIAGNOSIS — M329 Systemic lupus erythematosus, unspecified: Principal | ICD-10-CM

## 2020-05-11 MED ORDER — BENLYSTA 200 MG/ML SUBCUTANEOUS AUTO-INJECTOR
SUBCUTANEOUS | 2 refills | 28 days | Status: CP
Start: 2020-05-11 — End: ?
  Filled 2020-05-18: qty 4, 28d supply, fill #0

## 2020-05-11 MED ORDER — NALTREXONE 50 MG TABLET
ORAL_TABLET | 2 refills | 0.00000 days | Status: CP
Start: 2020-05-11 — End: 2020-07-13

## 2020-05-11 NOTE — Unmapped (Signed)
Patient is requesting the following refill  Requested Prescriptions     Pending Prescriptions Disp Refills   ??? naltrexone (DEPADE) 50 mg tablet [Pharmacy Med Name: NALTREXONE 50MG  TABLETS] 60 tablet 2     Sig: TAKE 1/2 TABLET(25 MG) BY MOUTH TWICE DAILY       Last OV: 03/03/2020     Last Virtual Visit: 09/17/2019     Next OV: 07/13/2020.     Labs: Not applicable this refill

## 2020-05-11 NOTE — Unmapped (Signed)
Requested Prescriptions     Pending Prescriptions Disp Refills    belimumab (BENLYSTA) 200 mg/mL AtIn 4 mL 2     Sig: Inject the contents of 1 injector (200mg ) under the skin every 7 days

## 2020-05-11 NOTE — Unmapped (Signed)
[facility] Refill Coordination Note    Specialty Medication(s) to be Shipped:   Inflammatory Disorders: Benlysta    Other medication(s) to be shipped: No additional medications requested for fill at this time     Tonya Brady, DOB: 29-Jun-1975  Phone: [phone] (home)       All above HIPAA information was verified with patient.     Was a Nurse, learning disability used for this call? No    Completed refill call assessment today to schedule patient's medication shipment from the [facility] [phone]).       Specialty medication(s) and dose(s) confirmed: Regimen is correct and unchanged.   Changes to medications: Tonya Brady reports no changes at this time.  Changes to insurance: No  Questions for the pharmacist: No    Confirmed patient received Welcome Packet with first shipment. The patient will receive a drug information handout for each medication shipped and additional FDA Medication Guides as required.       DISEASE/MEDICATION-SPECIFIC INFORMATION        For patients on injectable medications: Patient currently has 1 doses left.  Next injection is scheduled for 12/23.    SPECIALTY MEDICATION ADHERENCE     Medication Adherence    Patient reported X missed doses in the last month: 0  Specialty Medication: Benlysta  Patient is on additional specialty medications: No  Patient is on more than two specialty medications: No  Any gaps in refill history greater than 2 weeks in the last 3 months: no  Demonstrates understanding of importance of adherence: yes  Informant: patient                Benlysta 200mg /ml: Patient has 7 days of medication on hand       SHIPPING     Shipping address confirmed in Epic.     Delivery Scheduled: Yes, Expected medication delivery date: 12/28.  However, Rx request for refills was sent to the provider as there are none remaining.     Medication will be delivered via Same Day Courier to the prescription address in Epic WAM.    [name]   [facility] Specialty Technician

## 2020-05-18 DIAGNOSIS — M5412 Radiculopathy, cervical region: Principal | ICD-10-CM

## 2020-05-18 MED FILL — BENLYSTA 200 MG/ML SUBCUTANEOUS AUTO-INJECTOR: 28 days supply | Qty: 4 | Fill #0 | Status: AC

## 2020-06-11 NOTE — Unmapped (Signed)
[facility] Refill Coordination Note    Specialty Medication(s) to be Shipped:   Inflammatory Disorders: Benlysta    Other medication(s) to be shipped: No additional medications requested for fill at this time     Tonya Brady, DOB: 11/20/1975  Phone: [phone] (home)       All above HIPAA information was verified with patient.     Was a Nurse, learning disability used for this call? No    Completed refill call assessment today to schedule patient's medication shipment from the [facility] [phone]).       Specialty medication(s) and dose(s) confirmed: Regimen is correct and unchanged.   Changes to medications: Tonya Brady reports no changes at this time.  Changes to insurance: No  Questions for the pharmacist: No    Confirmed patient received Welcome Packet with first shipment. The patient will receive a drug information handout for each medication shipped and additional FDA Medication Guides as required.       DISEASE/MEDICATION-SPECIFIC INFORMATION        For patients on injectable medications: Patient currently has 1 doses left.  Next injection is scheduled for 06/11/2020.    SPECIALTY MEDICATION ADHERENCE     Medication Adherence    Patient reported X missed doses in the last month: 0  Specialty Medication: Benlysta  Patient is on additional specialty medications: No          SHIPPING     Shipping address confirmed in Epic.     Delivery Scheduled: Yes, Expected medication delivery date: 06/15/2020.     Medication will be delivered via Same Day Courier to the prescription address in Epic WAM.    Tonya Brady   [facility] Specialty Technician

## 2020-06-13 NOTE — Unmapped (Signed)
[facility]  [facility]  [address] [place]  [place], [place]  [address]  Ph: [phone]  Fax: [phone]     HEPATOLOGY RETURN VISIT PROGRESS NOTE    REFERRING PROVIDER: Alexandria Lodge, MD  PRIMARY CARE PROVIDER: Alexandria Lodge, MD    DATE OF SERVICE: 06/14/2020    ASSESSMENT AND PLAN:   Ms. [name] is a 45 y.o. 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 She established care with [facility] in 01/2016 when she was referred for elevated liver enzymes and findings of possible cirrhosis on ultrasound. She underwent liver biopsy on 03/2016 showing cirrhosis secondary to steatohepatitis without evidence of AIH. Her cirrhosis is well compensated with no history of ascites, gastroesophageal varices or hepatic encephalopathy. She is not being considered for transplant due to low MELD.     I congratulated her on weight loss today and asked her to be vigilant about it so that she can continue to avoid decompensation related to her liver. We discussed that getting down to a BMI < 30 would be ideal and that will be her goal.     1. NASH Cirrhosis:   -Repeat MELD labs today  -Continue weight loss    2. EV Screening: Last done 04/2019 with on varices  -Repeat EGD due in 04/2021    3. HCC Screening: Last done 11/2019 with no lesions. Repeat U/S q6 months + AFP  -U/S today shows no lesions, repeat in 6 months  -AFP sent today    4. Health Maintenance:  -Osteoporosis: Increased risk of osteopenia in patients with cirrhosis. Recommend checking Vitamin D level as well as DEXA scan if abnormal.   -Cervical Cancer: Last pap smear done: s/p hysterectomy  -Mammogram: Last done: Never done, defer to PCP  -Colon Cancer: Due at age 72    5. Vaccinations: We recommend that patients have vaccinations to prevent various infections that can occur, especially in the setting of having underlying liver disease. The following vaccinations should be given:  -Hepatitis A: Vaccinated  -Hepatitis B: Vaccinated  -Influenza (yearly): Per PCP  -Pneumococcal: PPSV23 02/2019. PCV13 due at age 76  -Zoster: Shingrix (age > 23). Two doses given 2-6 months apart.   -SARS-CoV-2: J&J, Pfizer booster    I spent a majority of my time today with the patient reviewing historical data, old records (including faxed data and data in Care Everywhere), performing a physical examination, and formulating an assessment and plan together with the patient.    Return to clinic in 6 months      Georga Hacking. Sherryll Burger, MD  Assistant Professor of Medicine   [facility]  Division of Gastroenterology & Hepatology  p: [phone] - f: [phone]  E-mail: [email or web address]    Subjective   SUBJECTIVE:     CHIEF COMPLAINT: NASH Cirrhosis    HISTORY OF PRESENT ILLNESS:      Tonya Brady is a 45 y.o. 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 She established care with [facility] in 01/2016 when she was referred for elevated liver enzymes and findings of possible cirrhosis on ultrasound. She underwent liver biopsy on 03/2016 showing cirrhosis secondary to steatohepatitis without evidence of AIH. Her cirrhosis is well compensated with no history of ascites, gastroesophageal varices or hepatic encephalopathy.  INTERVAL HISTORY:(since the time of the patient's last visit):    The patient was last seen by me on 03/07/2019 and has again been somewhat lost to follow up. She presents today alone. She has done extraordinary with weight loss since starting Ozempic for her diabetes, now down to 178 lbs. She feels great and has enough energy to where she is working two full time jobs. She has no other complaints related to her liver disease.     REVIEW OF SYSTEMS:   The balance of 12 systems reviewed is negative except as noted in the HPI.     PAST MEDICAL HISTORY:  Past Medical History:   Diagnosis Date   ??? Bulging lumbar disc 2005   ??? Cirrhosis of liver (CMS-HCC)    ??? Depression    ??? Diabetes mellitus (CMS-HCC)    ??? Difficult intravenous access    ??? Fibromyalgia 03/09/2013   ??? NASH (nonalcoholic steatohepatitis)    ??? Secondary Sjogren's syndrome (CMS-HCC) 03/09/2013    Secondary to SLE  Diagnosed: 12/2003 (by sx's, parotid gland enlargement, and serologies) Pathology: biopsy not done Labs: + ANA, anti-SSA, anti-SSB, RF    ??? Systemic lupus erythematosus (CMS-HCC) 01/19/2011   ??? Valvular regurgitation        MEDICATIONS:   Current Outpatient Medications   Medication Sig Dispense Refill   ??? belimumab (BENLYSTA) 200 mg/mL AtIn Inject the contents of 1 injector (200mg ) under the skin every 7 days 4 mL 2   ??? buPROPion (WELLBUTRIN SR) 150 MG 12 hr tablet TAKE 1 TABLET(150 MG) BY MOUTH TWICE DAILY 120 tablet 1   ??? diclofenac sodium (VOLTAREN) 1 % gel Apply 2 g topically Three (3) times a day as needed for arthritis or pain. 100 g 0   ??? empty container Misc Use as directed 1 each 2   ??? famotidine (PEPCID) 40 MG tablet TAKE 1 TABLET(40 MG) BY MOUTH EVERY NIGHT AS NEEDED FOR HEARTBURN 90 tablet 3   ??? hydrOXYchloroQUINE (PLAQUENIL) 200 mg tablet Take 200 mg by mouth Two (2) times a day.      ??? naltrexone (DEPADE) 50 mg tablet TAKE 1/2 TABLET(25 MG) BY MOUTH TWICE DAILY 60 tablet 2   ??? semaglutide (OZEMPIC) 0.25 mg or 0.5 mg(2 mg/1.5 mL) PnIj injection Inject 0.25 mg under the skin every seven (7) days. 1.5 mL 1   ??? albuterol HFA 90 mcg/actuation inhaler INHALE 2 PUFFS BY MOUTH EVERY 6 HOURS AS NEEDED FOR WHEEZING 6.7 g 0   ??? fluticasone propionate (FLONASE) 50 mcg/actuation nasal spray 1 spray into each nostril daily. 16 g 11   ??? ibuprofen (MOTRIN) 800 MG tablet ibuprofen 800 mg tablet     ??? inhalational spacing device (AEROCHAMBER MV) Spcr 1 each by Miscellaneous route every six (6) hours as needed. 1 each 0   ??? loratadine (CLARITIN) 10 mg tablet Take 10 mg by mouth daily. Takes as needed     ??? melatonin 1 mg Tab tablet Take by mouth nightly as needed.        No current facility-administered medications for this visit.       ALLERGIES:   Aspirin, Fish containing products, Iodine, and Shellfish containing products       Objective   OBJECTIVE:   VITAL SIGNS: BP 118/71  - Pulse 61  - Temp 36.8 ??C (98.3 ??F) (Tympanic)  - Ht 154.9 cm (5' 1)  - Wt 81 kg (178 lb 9.6 oz)  - SpO2 98%  - BMI 33.75  kg/m??   Wt Readings from Last 3 Encounters:   06/14/20 81 kg (178 lb 9.6 oz)   03/22/20 88.8 kg (195 lb 12.8 oz)   03/03/20 87.5 kg (193 lb)       PHYSICAL EXAM:  Constitutional: Alert, Oriented x 3, No acute distress, well nourished and well hydrated.   HEENT: PERRL, conjunctiva clear, anicteric, oropharynx clear, neck supple, no LAD.   CV: Regular rate and rhythm, normal S1, S2. No murmurs. No JVD.  Lung: Clear to auscultation bilaterally. Unlabored breathing.   Abdomen: soft, normal bowel sounds, non-distended, non-tender. No organomegaly. No ascites.   Extremities: No edema, well perfused  MSK: No joint swelling or tenderness noted, no deformities  Skin: No rashes, jaundice or skin lesions noted  Neuro: No focal deficits. No asterixis.   Mental Status: Thought organized, appropriate affect, pleasantly interactive, not anxious appearing    DIAGNOSTIC STUDIES:  I have reviewed all pertinent diagnostic studies, including:  Laboratory results:  Lab Results   Component Value Date    WBC 3.0 (L) 09/03/2019    HGB 14.2 09/03/2019    MCV 90.9 09/03/2019    PLT 164 09/03/2019    Lab Results   Component Value Date    NA 143 03/03/2020    K 4.0 03/03/2020    CL 108 (H) 03/03/2020    CREATININE 0.85 03/03/2020    GLU 82 03/03/2020    CALCIUM 9.6 03/03/2020      Lab Results   Component Value Date    ALBUMIN 4.0 03/03/2020    AST 21 03/03/2020    ALT 23 03/03/2020    ALKPHOS 53 03/03/2020    BILITOT 0.3 03/03/2020    Lab Results   Component Value Date    INR 1.16 03/07/2019        Lab Results   Component Value Date    AFPTM 2 03/07/2019       Lab Results   Component Value Date    IRON 67 01/31/2016    FERRITIN 251.0 (H) 01/31/2016     Lab Results   Component Value Date    CHOL 160 03/03/2020    TRIG 75 03/03/2020    A1C 5.4 03/03/2020     Lab Results   Component Value Date    VITAMINB12 >1000 (H) 06/23/2011     Lab Results   Component Value Date    HBSAG Nonreactive 11/17/2015    HEPCAB Nonreactive 11/17/2015     Lab Results   Component Value Date    ANA Positive (A) 01/31/2016    SMOOTHMUSCAB Negative 01/31/2016    MITOAB Negative 01/31/2016       Computed MELD-Na score unavailable. Necessary lab results were not found in the last year.    Radiographic studies:  US Abdomen complete 12/16/19:  - Stable morphologic changes of chronic liver disease. No focal liver lesions identified.  - LI-RADS ultrasound category: [place] 1- negative.  - Visualization score: B (moderate limitations)      GI Procedures:  EGD 05/10/2016:       The examined esophagus was normal.       There is no endoscopic evidence of varices in the entire esophagus.       The entire examined stomach was normal.       There is no endoscopic evidence of varices in the stomach.       The duodenal bulb and second portion of the duodenum were normal.    Liver Biopsy 03/24/2016:  A: Liver, core biopsy  - Liver with bridging fibrosis/early cirrhosis (stage 3???4), mild steatosis, and changes consistent with steatohepatitis

## 2020-06-14 ENCOUNTER — Ambulatory Visit: Admit: 2020-06-14 | Discharge: 2020-06-15 | Payer: PRIVATE HEALTH INSURANCE

## 2020-06-14 ENCOUNTER — Ambulatory Visit: Admit: 2020-06-14 | Discharge: 2020-06-14 | Payer: PRIVATE HEALTH INSURANCE

## 2020-06-14 DIAGNOSIS — K7581 Nonalcoholic steatohepatitis (NASH): Principal | ICD-10-CM

## 2020-06-14 DIAGNOSIS — K7460 Unspecified cirrhosis of liver: Principal | ICD-10-CM

## 2020-06-14 LAB — HEPATIC FUNCTION PANEL
ALBUMIN: 3.8 g/dL (ref 3.4–5.0)
ALKALINE PHOSPHATASE: 50 U/L (ref 46–116)
ALT (SGPT): 15 U/L (ref 10–49)
AST (SGOT): 18 U/L (ref <=34)
BILIRUBIN DIRECT: 0.2 mg/dL (ref 0.00–0.30)
BILIRUBIN TOTAL: 0.4 mg/dL (ref 0.3–1.2)
PROTEIN TOTAL: 6.9 g/dL (ref 5.7–8.2)

## 2020-06-14 LAB — CBC
HEMATOCRIT: 41.7 % (ref 36.0–46.0)
HEMOGLOBIN: 14 g/dL (ref 12.0–16.0)
MEAN CORPUSCULAR HEMOGLOBIN CONC: 33.5 g/dL (ref 31.0–37.0)
MEAN CORPUSCULAR HEMOGLOBIN: 31.3 pg (ref 26.0–34.0)
MEAN CORPUSCULAR VOLUME: 93.4 fL (ref 80.0–100.0)
MEAN PLATELET VOLUME: 11.1 fL — ABNORMAL HIGH (ref 7.0–10.0)
PLATELET COUNT: 172 10*9/L (ref 150–440)
RED BLOOD CELL COUNT: 4.47 10*12/L (ref 4.00–5.20)
RED CELL DISTRIBUTION WIDTH: 12.9 % (ref 12.0–15.0)
WBC ADJUSTED: 2.7 10*9/L — ABNORMAL LOW (ref 4.5–11.0)

## 2020-06-14 LAB — BASIC METABOLIC PANEL
ANION GAP: 7 mmol/L (ref 5–14)
BLOOD UREA NITROGEN: 8 mg/dL — ABNORMAL LOW (ref 9–23)
BUN / CREAT RATIO: 10
CALCIUM: 9.4 mg/dL (ref 8.7–10.4)
CHLORIDE: 110 mmol/L — ABNORMAL HIGH (ref 98–107)
CO2: 25 mmol/L (ref 20.0–31.0)
CREATININE: 0.77 mg/dL
EGFR CKD-EPI AA FEMALE: >90 mL/min/{1.73_m2} (ref >=60)
EGFR CKD-EPI NON-AA FEMALE: >90 mL/min/{1.73_m2} (ref >=60)
GLUCOSE RANDOM: 108 mg/dL (ref 70–179)
POTASSIUM: 4 mmol/L (ref 3.5–5.1)
SODIUM: 142 mmol/L (ref 135–145)

## 2020-06-14 LAB — PROTIME-INR
INR: 1.22
PROTIME: 14.2 s — ABNORMAL HIGH (ref 10.3–13.4)

## 2020-06-14 LAB — AFP TUMOR MARKER: AFP-TUMOR MARKER: 2 ng/mL (ref <=8)

## 2020-06-24 MED ORDER — OZEMPIC 0.25 MG OR 0.5 MG (2 MG/1.5 ML) SUBCUTANEOUS PEN INJECTOR
1 refills | 0 days
Start: 2020-06-24 — End: ?

## 2020-06-24 NOTE — Unmapped (Signed)
Patient is requesting the following refill  Requested Prescriptions     Pending Prescriptions Disp Refills   ??? OZEMPIC 0.25 mg or 0.5 mg(2 mg/1.5 mL) PnIj injection [Pharmacy Med Name: OZEMPIC 0.25 OR 0.5MG /DOS 1X2MG  PEN] 1.5 mL 1     Sig: INJECT 0.25 MG UNDER THE SKIN EVERY 7 DAYS       Last OV: 03/03/2020     Last Virtual Visit: 09/17/2019     Next OV: 03/12/2021    Labs: Not applicable this refill

## 2020-06-24 NOTE — Unmapped (Signed)
Patient is requesting the following refill  Requested Prescriptions     Pending Prescriptions Disp Refills   ??? OZEMPIC 0.25 mg or 0.5 mg(2 mg/1.5 mL) PnIj injection [Pharmacy Med Name: OZEMPIC 0.25 OR 0.5MG /DOS 1X2MG  PEN] 1.5 mL 1     Sig: INJECT 0.25 MG UNDER THE SKIN EVERY 7 DAYS       Last OV: 03/03/2020     Last Virtual Visit: 09/17/2019     Next OV: 06/24/2020.     Labs: Not applicable this refill

## 2020-06-27 NOTE — Unmapped (Addendum)
Tonya Brady 's Benlysta shipment will be delayed as a result of a high copay.     I have reached out to the patient and communicated the delay. We will wait for a call back from the patient to reschedule the delivery.  We have not confirmed the new delivery date.      Patient aware we tried to renew Benlysta card- [employer] said she no longer qualified for assistance- she said she got a letter from them stating she should be covered - she will follow up with them.

## 2020-06-28 NOTE — Unmapped (Signed)
Called and was able to inform the pt that the requested medication has been sent to the pharmacy on and file and should be available for pickup. She stated that she understood.

## 2020-06-30 NOTE — Unmapped (Signed)
Tonya Brady 's benlysta shipment will be canceled  as a result of patient not calling back to reschedule delivery.

## 2020-07-02 NOTE — Unmapped (Signed)
[facility] Refill Coordination Note    Specialty Medication(s) to be Shipped:   Inflammatory Disorders: Benlysta    Other medication(s) to be shipped: No additional medications requested for fill at this time     Tonya Brady, DOB: 11/07/1975  Phone: [phone] (home)       All above HIPAA information was verified with patient.     Was a Nurse, learning disability used for this call? No    Completed refill call assessment today to schedule patient's medication shipment from the [facility] [phone]).       Specialty medication(s) and dose(s) confirmed: Regimen is correct and unchanged.   Changes to medications: Tonya Brady reports no changes at this time.  Changes to insurance: No  Questions for the pharmacist: No    Confirmed patient received Welcome Packet with first shipment. The patient Brady receive a drug information handout for each medication shipped and additional FDA Medication Guides as required.       DISEASE/MEDICATION-SPECIFIC INFORMATION        For patients on injectable medications: Patient currently has 0 doses left.  Next injection is scheduled for 2/14.    SPECIALTY MEDICATION ADHERENCE     Medication Adherence    Patient reported X missed doses in the last month: 0  Specialty Medication: Benlysta  Patient is on additional specialty medications: No  Patient is on more than two specialty medications: No  Any gaps in refill history greater than 2 weeks in the last 3 months: no  Demonstrates understanding of importance of adherence: yes  Informant: patient                Benlysta 200mg /ml: Patient has 0 days of medication on hand      SHIPPING     Shipping address confirmed in Epic.     Delivery Scheduled: Yes, Expected medication delivery date: 2/14.     Medication Brady be delivered via Same Day Courier to the prescription address in Epic WAM.    [name]   [facility] Specialty Technician

## 2020-07-05 NOTE — Unmapped (Signed)
Tonya Brady 's Benlysta shipment will be delayed as a result of a high copay.     I have reached out to the patient and left a voicemail message.  We will wait for a call back from the patient to reschedule the delivery.  We have not confirmed the new delivery date.

## 2020-07-08 MED ORDER — OZEMPIC 0.25 MG OR 0.5 MG (2 MG/1.5 ML) SUBCUTANEOUS PEN INJECTOR
1 refills | 0 days
Start: 2020-07-08 — End: ?

## 2020-07-08 MED ORDER — BUPROPION HCL SR 150 MG TABLET,12 HR SUSTAINED-RELEASE
ORAL_TABLET | 1 refills | 0 days
Start: 2020-07-08 — End: ?

## 2020-07-08 NOTE — Unmapped (Signed)
Franklin D Balthazar 's Benlysta shipment will be sent out  as a result of a different copay.      I have reached out to the patient and communicated the delay. We will reschedule the medication for the delivery date that the patient agreed upon.  We have confirmed the delivery date as 07/09/20, via same day courier.

## 2020-07-09 MED FILL — BENLYSTA 200 MG/ML SUBCUTANEOUS AUTO-INJECTOR: SUBCUTANEOUS | 28 days supply | Qty: 4 | Fill #1

## 2020-07-09 NOTE — Unmapped (Signed)
Pt needs appt

## 2020-07-12 MED ORDER — BUPROPION HCL SR 150 MG TABLET,12 HR SUSTAINED-RELEASE
ORAL_TABLET | 1 refills | 0.00000 days | Status: CP
Start: 2020-07-12 — End: ?

## 2020-07-13 ENCOUNTER — Ambulatory Visit
Admit: 2020-07-13 | Discharge: 2020-07-14 | Payer: PRIVATE HEALTH INSURANCE | Attending: Family Medicine | Primary: Family Medicine

## 2020-07-13 DIAGNOSIS — K7581 Nonalcoholic steatohepatitis (NASH): Principal | ICD-10-CM

## 2020-07-13 DIAGNOSIS — K7460 Unspecified cirrhosis of liver: Principal | ICD-10-CM

## 2020-07-13 DIAGNOSIS — E669 Obesity, unspecified: Principal | ICD-10-CM

## 2020-07-13 MED ORDER — OZEMPIC 0.25 MG OR 0.5 MG (2 MG/1.5 ML) SUBCUTANEOUS PEN INJECTOR
SUBCUTANEOUS | 2 refills | 112 days | Status: CP
Start: 2020-07-13 — End: ?

## 2020-07-13 MED ORDER — NALTREXONE 50 MG TABLET
ORAL_TABLET | Freq: Two times a day (BID) | ORAL | 2 refills | 60.00000 days | Status: CP
Start: 2020-07-13 — End: ?

## 2020-07-13 NOTE — Unmapped (Signed)
[facility] Follow Up    Assessment/Plan:     Chief Complaint   Patient presents with   ??? Weight Loss     6 months       Problem List Items Addressed This Visit        Digestive    Liver cirrhosis secondary to NASH (nonalcoholic steatohepatitis) (CMS-HCC) - Primary     Following with [facility] (Dr. Sherryll Burger) and getting [place] q4mo for tumor surveillance as well as LFTs. We will continue to work on aggressive weight loss with lifestyle modifications and pharmacotherapy--See Class 1 obesity. Continue to follow Specialist closely.             Other    Class 1 obesity     Tonya Brady is a 45 y.o. 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 Her medical history includes SLE, cirrhosis, GERD, suspected OSA, depression, and emotional / stress eating. Pt wishes weight loss to prevent further liver damage.    Weight Summary:  1. Starting weight/BMI/WC: 209 lb, BMI 39.6 (06/13/19, self report), WC 39 (11/04/19, 1st measure)  2. Target weight/goal: BMI 30 or 159 lb.  3. Today's weight/BMI: 174 lb, BMI 32.9 (07/13/2020)  4. % Body weight loss: 8.1%  5. Today's Waist Circumference: 35.75 inches (07/13/2020) <= 38.75 (01/07/20)  6. Today's visit number: 6th  7. Duration in program: 13 mo    Referred by: [name], MD in [facility].    GOALS: Keep up the great work! Continue regular exercise and reduced sugar intake.    I have reviewed the patient's medical history, lifestyle history and labs/tests.   My recommendations include the following:    Diet quality and patterning: No more soda. One meal per day. Snacks on fruits. Pt is working on finding carbonated waters that she likes. Endorses healthy home cooked meals.  -- Will meet regularly with our nutritionist during the duration of medical management treatment. Discussed about healthier food choices and advised to home cook for less processed meals.    Exercise: Continues to walk outside on track. Gym 2-3x per week with weights and cardio (2-2.5 hr each session).  -- Goal of 30 minutes of aerobic exercise 5x a week or a total of 150 minutes weekly, and 30 minutes of resistance exercise 2x a week or a total of 60 min weekly.     Sleep: Currently sleeping 6 hours without sleep aids. +OSA screening (STOP-BANG 3/8). Pt declined to do sleep study for now and wishes to focus on lifestyle changes.  -- Recommend 7-8 hours of sleep at night. Advised to try melatonin 10 mg for insomnia.     Stress management: Current stressors: domestic, health, world news, finance. She has an established therapist.  -- Recommend to try relaxation techniques such as breathing techniques, listening to music to help sleep at night and yoga stretching throughout the day to help control stress.     Weight gain causing medication: None.   Hx of long term steroid use (prednisone 80 mg daily for lupus).  Hx of Ambien for insomnia years ago.    Medication: Taking medications as instructed. No s/e. Dr Sherryll Burger approved Wellbutrin 300mg  per day (MAX) as her cirrhosis is well compensated. Pt follows closely with hepatology. Pt was unable to fill [employer] barrier. We started low dose ozempic. Pt is taking as directed, tolerating well, denies s/e or sx. Pt wishes to continue  current regimen for now. Prescription refills sent. Will consider stopping naltrexone in the future.   CONTINUE Ozempic 0.25 mg weekly injections, Wellbutrin 300 mg BID, Naltrexone 25 mg BID.   - Wellbutrin: pt was started on this for depression before enrolling at our office. ?weight loss on it.  - Naltrexone: started on 06/13/19 at 209 lb.  Tried  - Orlistat: tried and failed.  Contraindicated  - Topamax: contraindicated dhx of kidney stone.  - Phentermine: contraindicated by hx of palpitations.    Obesity Surgery: Pt is interested in this option. However pt's BMI is 36 and rcurently losing 2 lb/wk with good response to medication and healthy lifestyle changes so we will revisit this option if pt gets on the plateau or gains weight above the BMI 35. (updated 07/13/2020)    Medical conditions:  Glaucoma: no  Seizures: no  Medullary thyroid cancer (personal or family hx): Father and PGM both had thyroid goiter. Multiple Endocrine Neoplasia: no  Palpitations/Tachycardia: YES  Chest Pain: no past history of MI.   Headaches/Migraines: YES  Nephrolithiasis: YES  H/o pancreatitis: YES, gallstone pancreatitis and she had cholecystectomy.  GERD: YES    Prior Surgeries:  Choleycystectomy: YES  Hysterectomy: YES    Obesity-associated comorbidities: Cirrhosis, GERD, suspected sleep apnea.  Current barriers include: Anxiety, depression, Sjogren's, lupus, emotional /stress eating, insomnia, fibromyalgia.  Birth Control Methods: N/A; s/p hysterectomy.         Relevant Medications    semaglutide (OZEMPIC) 0.25 mg or 0.5 mg(2 mg/1.5 mL) PnIj injection           Return in about 3 months (around 10/10/2020) for Weight clinic F/U one slot. ozempic .25, naltrexone 25bid, nash.    I have reviewed and addressed the patient???s adherence and response to prescribed medications. I have identified patient barriers to following the proposed medication and treatment plan, and have noted opportunities to optimize healthy behaviors. I have answered the patient???s questions to satisfaction and the patient voices understanding.    Time: Greater than 50% of this encounter was spent in direct consultation with the patient in evaluation and discussing all of the above. Duration of encounter: 25 minutes.     HPI:     Tonya Brady is a 45 y.o. year old female who has a  has a past medical history of Bulging lumbar disc (2005), Cirrhosis of liver (CMS-HCC), Depression, Diabetes mellitus (CMS-HCC), Difficult intravenous access, Fibromyalgia (03/09/2013), NASH (nonalcoholic steatohepatitis), Secondary Sjogren's syndrome (CMS-HCC) (03/09/2013), Systemic lupus erythematosus (CMS-HCC) (01/19/2011), and Valvular regurgitation. who presents today for [facility] follow up.    Weight Management History    Wt Readings from Last 6 Encounters:   07/13/20 79 kg (174 lb 1.3 oz)   06/14/20 81 kg (178 lb 9.6 oz)   03/22/20 88.8 kg (195 lb 12.8 oz)   03/03/20 87.5 kg (193 lb)   01/07/20 87.1 kg (192 lb)   01/06/20 87.1 kg (192 lb)     Update: Lost 18 lbs in 6 mo (0.75 lbs/wk). WC down 3 inches.  Medication: Taking Ozempic as directed. Tolerating well, denies s/e or sx.   Diet: Eating one meal per day and snacks on fruits at night while at work  Exercise: Gym 2-3x per week d/t time limitations. Strength training 2x per week, cardio almost every day.   Continues to walk track in the afternoon when picking up grandkids from school--loves to walk   Barrier: Pt has had to start working a second FT job  which she reports helps her stress    Lab Results   Component Value Date    LDL Calculated 101 (H) 03/03/2020    LDL Cholesterol, Calculated 104 06/23/2011    HDL 44 03/03/2020    HDL 35 (L) 06/23/2011    Hemoglobin A1C 5.4 03/03/2020    Hemoglobin A1C 5.9 02/10/2016    Hemoglobin A1C 5.5 06/23/2011    Glucose 108 06/14/2020    TSH 2.500 11/10/2015    TSH 1.92 06/23/2011    AST 18 06/14/2020    AST 19 07/26/2010    ALT 15 06/14/2020    ALT 33 07/26/2010    Alkaline Phosphatase 50 06/14/2020     Past Medical/Surgical History:     Past Medical History:   Diagnosis Date   ??? Bulging lumbar disc 2005   ??? Cirrhosis of liver (CMS-HCC)    ??? Depression    ??? Diabetes mellitus (CMS-HCC)    ??? Difficult intravenous access    ??? Fibromyalgia 03/09/2013   ??? NASH (nonalcoholic steatohepatitis)    ??? Secondary Sjogren's syndrome (CMS-HCC) 03/09/2013    Secondary to SLE  Diagnosed: 12/2003 (by sx's, parotid gland enlargement, and serologies) Pathology: biopsy not done Labs: + ANA, anti-SSA, anti-SSB, RF    ??? Systemic lupus erythematosus (CMS-HCC) 01/19/2011   ??? Valvular regurgitation      Past Surgical History:   Procedure Laterality Date   ??? CARPAL TUNNEL RELEASE     ??? CESAREAN SECTION     ??? CHOLECYSTECTOMY     ??? HERNIA REPAIR     ??? HYSTERECTOMY     ??? PR REVISE MEDIAN N/CARPAL TUNNEL SURG Left 12/30/2018    Procedure: R16 NEUROPLASTY AND/OR TRANSPOSITION; MEDIAN NERVE AT CARPAL TUNNEL;  Surgeon: Theodora Blow Jacqlyn Krauss, MD;  [facility];  Service: Orthopedics   ??? PR SALIVARY SURG UNLISTED PROC Right 12/06/2018    Procedure: R22 UNLISTED PROC SALIVARY GLANDS/DUCTS;  Surgeon: Michelle Piper, MD;  Location: [facility];  Service: ENT   ??? PR SALIVARY SURG UNLISTED PROC Left 02/18/2019    Procedure: R25 UNLISTED PROC SALIVARY GLANDS/DUCTS;  Surgeon: Michelle Piper, MD;  Location: [facility] OR [facility];  Service: ENT   ??? PR UPPER GI ENDOSCOPY,DIAGNOSIS N/A 05/10/2016    Procedure: UGI ENDO, INCLUDE ESOPHAGUS, STOMACH, & DUODENUM &/OR JEJUNUM; DX W/WO COLLECTION SPECIMN, BY BRUSH OR WASH;  Surgeon: Carmon Ginsberg, MD;  [facility];  Service: Gastroenterology   ??? PR UPPER GI ENDOSCOPY,DIAGNOSIS N/A 05/02/2019    Procedure: UGI ENDO, INCLUDE ESOPHAGUS, STOMACH, & DUODENUM &/OR JEJUNUM; DX W/WO COLLECTION SPECIMN, BY BRUSH OR WASH;  Surgeon: Chriss Driver, MD;  Location: [facility];  Service: Gastroenterology   ??? TOTAL ABDOMINAL HYSTERECTOMY     ??? TUBAL LIGATION         Social History:     Social History     Socioeconomic History   ??? Marital status: Married     Spouse name: None   ??? Number of children: None   ??? Years of education: None   ??? Highest education level: None   Occupational History   ??? None   Tobacco Use   ??? Smoking status: Never Smoker   ??? Smokeless tobacco: Never Used   Vaping Use   ??? Vaping Use: Never used   Substance and Sexual Activity   ??? Alcohol use: No     Alcohol/week: 0.0 standard drinks   ??? Drug use:  No   ??? Sexual activity: Not Currently     Partners: Male   Other Topics Concern   ??? Do you use sunscreen? Yes   ??? Tanning bed use? No   ??? Are you easily burned? No   ??? Excessive sun exposure? No ??? Blistering sunburns? No   Social History Narrative   ??? None     Social Determinants of Health     Financial Resource Strain: Low Risk    ??? Difficulty of Paying Living Expenses: Not hard at all   Food Insecurity: No Food Insecurity   ??? Worried About Programme researcher, broadcasting/film/video in the Last Year: Never true   ??? Ran Out of Food in the Last Year: Never true   Transportation Needs: No Transportation Needs   ??? Lack of Transportation (Medical): No   ??? Lack of Transportation (Non-Medical): No   Physical Activity: Insufficiently Active   ??? Days of Exercise per Week: 3 days   ??? Minutes of Exercise per Session: 30 min   Stress: Stress Concern Present   ??? Feeling of Stress : To some extent   Social Connections: Unknown   ??? Frequency of Communication with Friends and Family: Three times a week   ??? Frequency of Social Gatherings with Friends and Family: Three times a week   ??? Attends Religious Services: Never   ??? Active Member of Clubs or Organizations: No   ??? Attends Banker Meetings: Never   ??? Marital Status: Not on file       Family History:     Family History   Problem Relation Age of Onset   ??? Cancer Mother    ??? Hypertension Mother    ??? Heart disease Father    ??? Heart attack Father    ??? Depression Son    ??? Mental illness Son    ??? Aneurysm Maternal Aunt    ??? Diabetes Maternal Aunt    ??? Diabetes Maternal Grandmother    ??? Hypertension Maternal Grandmother    ??? Thyroid disease Maternal Grandmother    ??? Breast cancer Maternal Grandmother    ??? Breast cancer Paternal Grandmother    ??? Diabetes Paternal Grandmother    ??? Thyroid disease Paternal Grandmother    ??? Diabetes Maternal Uncle    ??? Heart attack Maternal Uncle    ??? Melanoma Neg Hx    ??? Basal cell carcinoma Neg Hx    ??? Squamous cell carcinoma Neg Hx    ??? Drug abuse Neg Hx    ??? Stroke Neg Hx        Allergies:     Aspirin, Fish containing products, Iodine, and Shellfish containing products    Current Medications:     Current Outpatient Medications   Medication Sig Dispense Refill   ??? albuterol HFA 90 mcg/actuation inhaler INHALE 2 PUFFS BY MOUTH EVERY 6 HOURS AS NEEDED FOR WHEEZING 6.7 g 0   ??? belimumab (BENLYSTA) 200 mg/mL AtIn Inject the contents of 1 injector (200mg ) under the skin every 7 days 4 mL 2   ??? buPROPion (WELLBUTRIN SR) 150 MG 12 hr tablet TAKE 1 TABLET(150 MG) BY MOUTH TWICE DAILY 120 tablet 1   ??? diclofenac sodium (VOLTAREN) 1 % gel Apply 2 g topically Three (3) times a day as needed for arthritis or pain. 100 g 0   ??? empty container Misc Use as directed 1 each 2   ??? famotidine (PEPCID) 40 MG tablet TAKE 1 TABLET(40 MG)  BY MOUTH EVERY NIGHT AS NEEDED FOR HEARTBURN 90 tablet 3   ??? fluticasone propionate (FLONASE) 50 mcg/actuation nasal spray 1 spray into each nostril daily. 16 g 11   ??? hydrOXYchloroQUINE (PLAQUENIL) 200 mg tablet Take 200 mg by mouth Two (2) times a day.      ??? ibuprofen (MOTRIN) 800 MG tablet ibuprofen 800 mg tablet     ??? inhalational spacing device (AEROCHAMBER MV) Spcr 1 each by Miscellaneous route every six (6) hours as needed. 1 each 0   ??? loratadine (CLARITIN) 10 mg tablet Take 10 mg by mouth daily. Takes as needed     ??? melatonin 1 mg Tab tablet Take by mouth nightly as needed.      ??? naltrexone (DEPADE) 50 mg tablet Take 0.5 tablets (25 mg total) by mouth two (2) times a day. 60 tablet 2   ??? semaglutide (OZEMPIC) 0.25 mg or 0.5 mg(2 mg/1.5 mL) PnIj injection Inject 0.25 mg under the skin every seven (7) days. 3 mL 2     No current facility-administered medications for this visit.       I have reviewed and (if needed) updated the patient's problem list, medications, allergies, past medical and surgical history, social and family history.    ROS:     Unless otherwise stated in the HPI:  CONSTITUTIONAL: no fever chills.   HEENT: Eyes: No diplopia or blurred vision. ENT: No earache, sore throat or runny nose.   CARDIOVASCULAR: No pressure, squeezing, strangling, tightness, heaviness or aching about the chest, neck, axilla or epigastrium. RESPIRATORY: No cough, shortness of breath, PND or orthopnea.   GASTROINTESTINAL: No nausea, vomiting or diarrhea.   GENITOURINARY: No dysuria, frequency or urgency.   MUSCULOSKELETAL: no new pains, no joint swelling or redness.   SKIN: No change in skin, hair or nails.   NEUROLOGIC: No paresthesias, fasciculations, seizures or weakness.   PSYCHIATRIC: No disorder of thought or mood.   ENDOCRINE: No heat or cold intolerance, polyuria or polydipsia.   HEMATOLOGICAL: No easy bruising or bleeding.    Vital Signs:     Body mass index is 32.89 kg/m??.  Waist Circumference: 35.75 inches  Neck Circumference: 12.5  Wt Readings from Last 3 Encounters:   07/13/20 79 kg (174 lb 1.3 oz)   06/14/20 81 kg (178 lb 9.6 oz)   03/22/20 88.8 kg (195 lb 12.8 oz)     Temp Readings from Last 3 Encounters:   07/13/20 36.9 ??C (98.5 ??F) (Temporal)   06/14/20 36.8 ??C (98.3 ??F) (Tympanic)   03/22/20 36.2 ??C (97.1 ??F) (Temporal)     BP Readings from Last 3 Encounters:   07/13/20 105/70   06/14/20 118/71   03/22/20 109/71     Pulse Readings from Last 3 Encounters:   07/13/20 58   06/14/20 61   03/22/20 60     Waist Circumference 11/04/2019 01/07/2020 07/13/2020   Waist Circumference 39 38.75 35.75     Physical Exam:     General: well appearing, in NAD, Body mass index is 32.89 kg/m??. Ambulatory without help.  Body fat distribution: central & gluteofemoral adiposity. No supraclavicular adiposity. No dorsal adiposity. Waist Circumference: 35.75 inches Neck Circumference: 12.5   Head: normocephalic atraumatic.  Eyes: PERRLA, EOMI, Sclera WNL.   Oral pharynx: moist, no exudate, no erythema, not enlarged. Good dental hygiene. Moderate OP crowding.  Neck: supple, no LAD, no thyromegaly, no bruit.  CV: RRR no rubs or murmurs. Peripheral edema: none.  Lungs: clear bilaterally to  ascultation. No wheezing.  Abdomen: soft, NT/ND. No intertrigo. Moderate pannus. No hepatomegaly.   Extremities: no clubbing, cyanosis, No edema.  Skin: no concerning lesions, rashes observed. No lipomas, no acanthosis nigricans.  Neuro: Alert and oriented X 3.    Labs:     Office Visit on 06/14/2020   Component Date Value Ref Range Status   ??? AFP-Tumor Marker 06/14/2020 2  <=8 ng/mL Final   ??? Albumin 06/14/2020 3.8  3.4 - 5.0 g/dL Final   ??? Total Protein 06/14/2020 6.9  5.7 - 8.2 g/dL Final   ??? Total Bilirubin 06/14/2020 0.4  0.3 - 1.2 mg/dL Final   ??? Bilirubin, Direct 06/14/2020 0.20  0.00 - 0.30 mg/dL Final   ??? AST 16/02/9603 18  <=34 U/L Final   ??? ALT 06/14/2020 15  10 - 49 U/L Final   ??? Alkaline Phosphatase 06/14/2020 50  46 - 116 U/L Final   ??? Sodium 06/14/2020 142  135 - 145 mmol/L Final   ??? Potassium 06/14/2020 4.0  3.5 - 5.1 mmol/L Final   ??? Chloride 06/14/2020 110* 98 - 107 mmol/L Final   ??? CO2 06/14/2020 25.0  20.0 - 31.0 mmol/L Final   ??? Anion Gap 06/14/2020 7  5 - 14 mmol/L Final   ??? BUN 06/14/2020 8* 9 - 23 mg/dL Final   ??? Creatinine 06/14/2020 0.77  0.60 - 0.80 mg/dL Final   ??? BUN/Creatinine Ratio 06/14/2020 10   Final   ??? EGFR CKD-EPI Non-African American,* 06/14/2020 >90  >=60 mL/min/1.59m2 Final   ??? EGFR CKD-EPI African American, Fem* 06/14/2020 >90  >=60 mL/min/1.67m2 Final   ??? Glucose 06/14/2020 108  70 - 179 mg/dL Final   ??? Calcium 54/01/8118 9.4  8.7 - 10.4 mg/dL Final   ??? PT 14/78/2956 14.2* 10.3 - 13.4 sec Final   ??? INR 06/14/2020 1.22   Final   ??? WBC 06/14/2020 2.7* 4.5 - 11.0 10*9/L Final   ??? RBC 06/14/2020 4.47  4.00 - 5.20 10*12/L Final   ??? HGB 06/14/2020 14.0  12.0 - 16.0 g/dL Final   ??? HCT 21/30/8657 41.7  36.0 - 46.0 % Final   ??? MCV 06/14/2020 93.4  80.0 - 100.0 fL Final   ??? MCH 06/14/2020 31.3  26.0 - 34.0 pg Final   ??? MCHC 06/14/2020 33.5  31.0 - 37.0 g/dL Final   ??? RDW 84/69/6295 12.9  12.0 - 15.0 % Final   ??? MPV 06/14/2020 11.1* 7.0 - 10.0 fL Final   ??? Platelet 06/14/2020 172  150 - 440 10*9/L Final       Follow-up:     Recommend well adult check/physical in 1 year. Otherwise, follow up as below.  Return in about 3 months (around 10/10/2020) for Weight clinic F/U one slot. ozempic .25, naltrexone 25bid, nash.    Health maintenance reviewed and recommendations made based on [place] Preventative Task Force (USPTF) recommendations. Reviewed appropriate diet and exercise. Patient stated understanding and there were no barriers to learning.     I attest that I, [place] Sloop R Nakari Bracknell, personally documented this note while acting as scribe for [facility], MD.      Arlana Pouch, Scribe.  07/13/2020     The documentation recorded by the scribe accurately reflects the service I personally performed and the decisions made by me.    Marshell Garfinkel, MD

## 2020-07-13 NOTE — Unmapped (Addendum)
Following with [facility] (Dr. Sherryll Burger) and getting [place] q69mo for tumor surveillance as well as LFTs. We will continue to work on aggressive weight loss with lifestyle modifications and pharmacotherapy--See Class 1 obesity. Continue to follow Specialist closely.

## 2020-07-13 NOTE — Unmapped (Addendum)
Tonya Brady is a 45 y.o. WF with Class 2 obesity due to long term steroid use (dx of SLE since age 66). Her medical history includes SLE, cirrhosis, GERD, suspected OSA, depression, and emotional / stress eating. Pt wishes weight loss to prevent further liver damage.    Weight Summary:  1. Starting weight/BMI/WC: 209 lb, BMI 39.6 (06/13/19, self report), WC 39 (11/04/19, 1st measure)  2. Target weight/goal: BMI 30 or 159 lb.  3. Today's weight/BMI: 174 lb, BMI 32.9 (07/13/2020)  4. % Body weight loss: 8.1%  5. Today's Waist Circumference: 35.75 inches (07/13/2020) <= 38.75 (01/07/20)  6. Today's visit number: 6th  7. Duration in program: 13 mo    Referred by: [name], MD in [facility].    GOALS: Keep up the great work! Continue regular exercise and reduced sugar intake.    I have reviewed the patient's medical history, lifestyle history and labs/tests.   My recommendations include the following:    Diet quality and patterning: No more soda. One meal per day. Snacks on fruits. Pt is working on finding carbonated waters that she likes. Endorses healthy home cooked meals.  -- Will meet regularly with our nutritionist during the duration of medical management treatment. Discussed about healthier food choices and advised to home cook for less processed meals.    Exercise: Continues to walk outside on track. Gym 2-3x per week with weights and cardio (2-2.5 hr each session).  -- Goal of 30 minutes of aerobic exercise 5x a week or a total of 150 minutes weekly, and 30 minutes of resistance exercise 2x a week or a total of 60 min weekly.     Sleep: Currently sleeping 6 hours without sleep aids. +OSA screening (STOP-BANG 3/8). Pt declined to do sleep study for now and wishes to focus on lifestyle changes.  -- Recommend 7-8 hours of sleep at night. Advised to try melatonin 10 mg for insomnia.     Stress management: Current stressors: domestic, health, world news, finance. She has an established therapist.  -- Recommend to try relaxation techniques such as breathing techniques, listening to music to help sleep at night and yoga stretching throughout the day to help control stress.     Weight gain causing medication: None.   Hx of long term steroid use (prednisone 80 mg daily for lupus).  Hx of Ambien for insomnia years ago.    Medication: Taking medications as instructed. No s/e. Dr Sherryll Burger approved Wellbutrin 300mg  per day (MAX) as her cirrhosis is well compensated. Pt follows closely with hepatology. Pt was unable to fill [employer] barrier. We started low dose ozempic. Pt is taking as directed, tolerating well, denies s/e or sx. Pt wishes to continue current regimen for now. Prescription refills sent. Will consider stopping naltrexone in the future.   CONTINUE Ozempic 0.25 mg weekly injections, Wellbutrin 300 mg BID, Naltrexone 25 mg BID.   - Wellbutrin: pt was started on this for depression before enrolling at our office. ?weight loss on it.  - Naltrexone: started on 06/13/19 at 209 lb.  Tried  - Orlistat: tried and failed.  Contraindicated  - Topamax: contraindicated dhx of kidney stone.  - Phentermine: contraindicated by hx of palpitations.    Obesity Surgery: Pt is interested in this option. However pt's BMI is 36 and rcurently losing 2 lb/wk with good response to medication and healthy lifestyle changes so we will revisit this option if pt gets on the plateau or gains weight above  the BMI 35. (updated 07/13/2020)    Medical conditions:  Glaucoma: no  Seizures: no  Medullary thyroid cancer (personal or family hx): Father and PGM both had thyroid goiter. Multiple Endocrine Neoplasia: no  Palpitations/Tachycardia: YES  Chest Pain: no past history of MI.   Headaches/Migraines: YES  Nephrolithiasis: YES  H/o pancreatitis: YES, gallstone pancreatitis and she had cholecystectomy.  GERD: YES    Prior Surgeries:  Choleycystectomy: YES  Hysterectomy: YES    Obesity-associated comorbidities: Cirrhosis, GERD, suspected sleep apnea.  Current barriers include: Anxiety, depression, Sjogren's, lupus, emotional /stress eating, insomnia, fibromyalgia.  Birth Control Methods: N/A; s/p hysterectomy.

## 2020-07-27 NOTE — Unmapped (Signed)
[facility] Refill Coordination Note    Specialty Medication(s) to be Shipped:   Inflammatory Disorders: Benlysta    Other medication(s) to be shipped: No additional medications requested for fill at this time     Tonya Brady, DOB: 1975-09-05  Phone: [phone] (home)       All above HIPAA information was verified with patient.     Was a Nurse, learning disability used for this call? No    Completed refill call assessment today to schedule patient's medication shipment from the [facility] [phone]).       Specialty medication(s) and dose(s) confirmed: Regimen is correct and unchanged.   Changes to medications: Tonya Brady reports no changes at this time.  Changes to insurance: No  Questions for the pharmacist: No    Confirmed patient received Welcome Packet with first shipment. The patient will receive a drug information handout for each medication shipped and additional FDA Medication Guides as required.       DISEASE/MEDICATION-SPECIFIC INFORMATION        For patients on injectable medications: Patient currently has 1 doses left.  Next injection is scheduled for 3/10.    SPECIALTY MEDICATION ADHERENCE     Medication Adherence    Patient reported X missed doses in the last month: 0  Specialty Medication: Benlysta  Patient is on additional specialty medications: No  Patient is on more than two specialty medications: No  Any gaps in refill history greater than 2 weeks in the last 3 months: no  Demonstrates understanding of importance of adherence: yes  Informant: patient                Benlysta 200mg /ml : Patient has 7 days of medication on hand      SHIPPING     Shipping address confirmed in Epic.     Delivery Scheduled: Yes, Expected medication delivery date: 3/15.     Medication will be delivered via Same Day Courier to the prescription address in Epic WAM.    Tonya Brady   [facility] Specialty Technician

## 2020-08-03 MED FILL — BENLYSTA 200 MG/ML SUBCUTANEOUS AUTO-INJECTOR: SUBCUTANEOUS | 28 days supply | Qty: 4 | Fill #2

## 2020-08-26 DIAGNOSIS — M329 Systemic lupus erythematosus, unspecified: Principal | ICD-10-CM

## 2020-08-26 MED ORDER — BENLYSTA 200 MG/ML SUBCUTANEOUS AUTO-INJECTOR
SUBCUTANEOUS | 2 refills | 28 days | Status: CP
Start: 2020-08-26 — End: ?
  Filled 2020-08-30: qty 4, 28d supply, fill #0

## 2020-08-26 NOTE — Unmapped (Addendum)
[facility] Clinical Assessment & Refill Coordination Note    Tonya Brady, DOB: June 04, 1975  Phone: [phone] (home)     All above HIPAA information was verified with patient.     Was a Nurse, learning disability used for this call? No    Specialty Medication(s):   Inflammatory Disorders: Benlysta     Current Outpatient Medications   Medication Sig Dispense Refill   ??? albuterol HFA 90 mcg/actuation inhaler INHALE 2 PUFFS BY MOUTH EVERY 6 HOURS AS NEEDED FOR WHEEZING 6.7 g 0   ??? belimumab (BENLYSTA) 200 mg/mL AtIn Inject the contents of 1 injector (200mg ) under the skin every 7 days 4 mL 2   ??? buPROPion (WELLBUTRIN SR) 150 MG 12 hr tablet TAKE 1 TABLET(150 MG) BY MOUTH TWICE DAILY 120 tablet 1   ??? diclofenac sodium (VOLTAREN) 1 % gel Apply 2 g topically Three (3) times a day as needed for arthritis or pain. 100 g 0   ??? empty container Misc Use as directed 1 each 2   ??? famotidine (PEPCID) 40 MG tablet TAKE 1 TABLET(40 MG) BY MOUTH EVERY NIGHT AS NEEDED FOR HEARTBURN 90 tablet 3   ??? fluticasone propionate (FLONASE) 50 mcg/actuation nasal spray 1 spray into each nostril daily. 16 g 11   ??? hydrOXYchloroQUINE (PLAQUENIL) 200 mg tablet Take 200 mg by mouth Two (2) times a day.      ??? ibuprofen (MOTRIN) 800 MG tablet ibuprofen 800 mg tablet     ??? inhalational spacing device (AEROCHAMBER MV) Spcr 1 each by Miscellaneous route every six (6) hours as needed. 1 each 0   ??? loratadine (CLARITIN) 10 mg tablet Take 10 mg by mouth daily. Takes as needed     ??? melatonin 1 mg Tab tablet Take by mouth nightly as needed.      ??? naltrexone (DEPADE) 50 mg tablet Take 0.5 tablets (25 mg total) by mouth two (2) times a day. 60 tablet 2   ??? semaglutide (OZEMPIC) 0.25 mg or 0.5 mg(2 mg/1.5 mL) PnIj injection Inject 0.25 mg under the skin every seven (7) days. 3 mL 2     No current facility-administered medications for this visit.        Changes to medications: Shawonda reports no changes at this time.    Allergies Allergen Reactions   ??? Aspirin Swelling     Other reaction(s): SWELLING/EDEMA   ??? Fish Containing Products Diarrhea and Nausea And Vomiting   ??? Iodine Other (See Comments)     Pt told not to have d/t shellfish allergy   ??? Shellfish Containing Products Hives, Diarrhea and Nausea Only     Other reaction(s): HIVES  Other reaction(s): HIVES  Other reaction(s): NAUSEA       Changes to allergies: No    SPECIALTY MEDICATION ADHERENCE     Benlysta 200 mg/ml: 7 days of medicine on hand     Medication Adherence    Patient reported X missed doses in the last month: 0  Specialty Medication: Benlysta 200mg /ml          Specialty medication(s) dose(s) confirmed: Regimen is correct and unchanged.     Are there any concerns with adherence? No    Adherence counseling provided? Not needed    CLINICAL MANAGEMENT AND INTERVENTION      Clinical Benefit Assessment:    Do you feel the medicine is effective or helping your condition? Yes    Clinical Benefit counseling provided? Not needed  Adverse Effects Assessment:    Are you experiencing any side effects? No    Are you experiencing difficulty administering your medicine? No    Quality of Life Assessment:    Rheumatology:   Quality of Life    On a scale of 1 ??? 10 with 1 representing not at all and 10 representing completely ??? how has your rheumatologic condition affected your:  Daily pain level?: decline to answer  Ability to complete your regular daily tasks (prepare meals, get dressed, etc.)?: decline to answer  Ability to participate in social or family activities?: decline to answer         Have you discussed this with your provider? Not needed    Acute Infection Status:    Acute infections noted within Epic:  No active infections  Patient reported infection: None    Therapy Appropriateness:    Is therapy appropriate? Yes, therapy is appropriate and should be continued    DISEASE/MEDICATION-SPECIFIC INFORMATION      For patients on injectable medications: Patient currently has 1 doses left.  Next injection is scheduled for 08/26/2020.    PATIENT SPECIFIC NEEDS     - Does the patient have any physical, cognitive, or cultural barriers? No    - Is the patient high risk? No    - Does the patient require a Care Management Plan? No     - Does the patient require physician intervention or other additional services (i.e. nutrition, smoking cessation, social work)? No      SHIPPING     Specialty Medication(s) to be Shipped:   Inflammatory Disorders: Benlysta 200mg /ml    Other medication(s) to be shipped: No additional medications requested for fill at this time     Changes to insurance: No    Delivery Scheduled: Yes, Expected medication delivery date: 08/30/2020.  However, Rx request for refills was sent to the provider as there are none remaining.     Medication will be delivered via Same Day Courier to the confirmed prescription address in [facility].    The patient will receive a drug information handout for each medication shipped and additional FDA Medication Guides as required.  Verified that patient has previously received a Conservation officer, historic buildings and a Surveyor, mining.    All of the patient's questions and concerns have been addressed.    Karene Fry Olyn Landstrom   [facility] Specialty Pharmacist

## 2020-09-08 DIAGNOSIS — M3500 Sicca syndrome, unspecified: Principal | ICD-10-CM

## 2020-09-08 DIAGNOSIS — M329 Systemic lupus erythematosus, unspecified: Principal | ICD-10-CM

## 2020-09-08 MED ORDER — HYDROXYCHLOROQUINE 200 MG TABLET
ORAL_TABLET | Freq: Two times a day (BID) | ORAL | 1 refills | 90 days | Status: CP
Start: 2020-09-08 — End: 2021-03-07

## 2020-09-08 NOTE — Unmapped (Signed)
Last Visit Date: 03/22/2020  Next Visit Date: 11/01/2020    Lab Results   Component Value Date    ALT 15 06/14/2020    AST 18 06/14/2020    ALBUMIN 3.8 06/14/2020    CREATININE 0.77 06/14/2020     Lab Results   Component Value Date    WBC 2.7 (L) 06/14/2020    HGB 14.0 06/14/2020    HCT 41.7 06/14/2020    PLT 172 06/14/2020     Lab Results   Component Value Date    NEUTROPCT 67.1 09/03/2019    LYMPHOPCT 19.8 09/03/2019    MONOPCT 11.4 09/03/2019    EOSPCT 1.4 09/03/2019    BASOPCT 0.3 09/03/2019

## 2020-09-22 DIAGNOSIS — E669 Obesity, unspecified: Principal | ICD-10-CM

## 2020-09-22 MED ORDER — OZEMPIC 0.25 MG OR 0.5 MG (2 MG/1.5 ML) SUBCUTANEOUS PEN INJECTOR
SUBCUTANEOUS | 0 refills | 0.00000 days | Status: CP
Start: 2020-09-22 — End: ?

## 2020-09-22 NOTE — Unmapped (Signed)
Patient is requesting the following refill  Requested Prescriptions     Pending Prescriptions Disp Refills   ??? OZEMPIC 0.25 mg or 0.5 mg(2 mg/1.5 mL) PnIj injection [Pharmacy Med Name: OZEMPIC 0.25 OR 0.5MG /DOS 1X2MG  PEN] 1.5 mL 0     Sig: INJECT 0.25 MG UNDER THE SKIN EVERY 7 DAYS       Recent Visits  Date Type Provider Dept   07/13/20 Office Visit Riley Kill, MD [facility] [address] [place]   03/03/20 Office Visit Alexandria Lodge, MD [facility] [address] [facility]   01/07/20 Office Visit Maralyn Sago Elon Jester, MD [facility] [address] [facility]   11/04/19 Office Visit Maralyn Sago Elon Jester, MD [facility] [address] [place]   Showing recent visits within past 365 days with a meds authorizing provider and meeting all other requirements  Future Appointments  Date Type Provider Dept   10/14/20 Appointment Riley Kill, MD [place] [facility] [address] [place]   03/07/21 Appointment Alexandria Lodge, MD [facility] [address] [place]   Showing future appointments within next 365 days with a meds authorizing provider and meeting all other requirements       Labs:   A1c:   Hemoglobin A1C (%)   Date Value   03/03/2020 5.4   02/10/2016 5.9   06/23/2011 5.5

## 2020-09-22 NOTE — Unmapped (Signed)
[facility] Refill Coordination Note    Specialty Medication(s) to be Shipped:   Inflammatory Disorders: Benlysta    Other medication(s) to be shipped: No additional medications requested for fill at this time     CHAILYN RACETTE, DOB: 1976-05-15  Phone: [phone] (home)       All above HIPAA information was verified with patient.     Was a Nurse, learning disability used for this call? No    Completed refill call assessment today to schedule patient's medication shipment from the [facility] [phone]).  All relevant notes have been reviewed.     Specialty medication(s) and dose(s) confirmed: Regimen is correct and unchanged.   Changes to medications: Lizbeth reports no changes at this time.  Changes to insurance: No  New side effects reported not previously addressed with a pharmacist or physician: None reported  Questions for the pharmacist: No    Confirmed patient received a Conservation officer, historic buildings and a Surveyor, mining with first shipment. The patient will receive a drug information handout for each medication shipped and additional FDA Medication Guides as required.       DISEASE/MEDICATION-SPECIFIC INFORMATION        For patients on injectable medications: Patient currently has 1 doses left.  Next injection is scheduled for 5/5.    SPECIALTY MEDICATION ADHERENCE     Medication Adherence    Patient reported X missed doses in the last month: 0  Specialty Medication: Benlysta  Patient is on additional specialty medications: No  Patient is on more than two specialty medications: No  Any gaps in refill history greater than 2 weeks in the last 3 months: no  Demonstrates understanding of importance of adherence: yes  Informant: patient              Were doses missed due to medication being on hold? No    Benlysta 200mg /ml: Patient has 7 days of medication on hand    REFERRAL TO PHARMACIST     Referral to the pharmacist: Not needed      [facility]     Shipping address confirmed in Epic. Delivery Scheduled: Yes, Expected medication delivery date: 5/10.     Medication will be delivered via Same Day Courier to the prescription address in Epic WAM.    [name]   [facility] Specialty Technician

## 2020-09-28 MED FILL — BENLYSTA 200 MG/ML SUBCUTANEOUS AUTO-INJECTOR: SUBCUTANEOUS | 28 days supply | Qty: 4 | Fill #1

## 2020-10-01 NOTE — Unmapped (Signed)
[facility] Clinical Intervention    Type of intervention: Medication administration    Medication involved: Benlysta 200mg /ml    Problem identified: The first pen the patient used from their recent shipment misfired - safety guard would not slide into pen body when pressed against injection site, unable to 'unlock' trigger to administer dose.    Intervention performed: Confirmed proper injection technique was being used, patient was able to successfully administer a dose with another pen in the same shipment but will be without one dose due to the pen defect.  Supplied the patient with the [employer] contact number [phone]) and explained the process for her to reach out and request a replacement.  I asked she contact us for further help processing the replacement if needed.  Prescription will be able to refill through her insurance as early as 10/13/2020 so she will not have to miss any doses - adjusting her next refill call date to that day.    Follow-up needed: none at this time    Approximate time spent: 0-5 minutes    Clinical evidence used to support intervention: Professional judgement    Lupita Shutter   [facility] Specialty Pharmacist

## 2020-10-14 ENCOUNTER — Ambulatory Visit: Admit: 2020-10-14 | Payer: PRIVATE HEALTH INSURANCE | Attending: Family Medicine | Primary: Family Medicine

## 2020-10-14 NOTE — Unmapped (Signed)
Tonya Brady is a 45 y.o. 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 Her medical history includes SLE, cirrhosis, GERD, suspected OSA, depression, and emotional / stress eating. Pt wishes weight loss to prevent further liver damage.    Weight Summary:  1. Starting weight/BMI/WC: 209 lb, BMI 39.6 (06/13/19, self report), WC 39 (11/04/19, 1st measure)  2. Target weight/goal: BMI 30 or 159 lb.  3. Today's weight/BMI: 174 lb, BMI 32.9 (10/14/2020)  4. % Body weight loss: 8.1%  5. Today's   (10/14/2020) <= 38.75 (01/07/20)  6. Today's visit number: 7th  7. Duration in program: 16 mo    Referred by: [name], MD in [facility].    GOALS: Keep up the great work! Continue regular exercise and reduced sugar intake.    I have reviewed the patient's medical history, lifestyle history and labs/tests.   My recommendations include the following:    Diet quality and patterning: No more soda. One meal per day. Snacks on fruits. Pt is working on finding carbonated waters that she likes. Endorses healthy home cooked meals.  -- Will meet regularly with our nutritionist during the duration of medical management treatment. Discussed about healthier food choices and advised to home cook for less processed meals.    Exercise: Continues to walk outside on track. Gym 2-3x per week with weights and cardio (2-2.5 hr each session).  -- Goal of 30 minutes of aerobic exercise 5x a week or a total of 150 minutes weekly, and 30 minutes of resistance exercise 2x a week or a total of 60 min weekly.     Sleep: Currently sleeping 6 hours without sleep aids. +OSA screening (STOP-BANG 3/8). Pt declined to do sleep study for now and wishes to focus on lifestyle changes.  -- Recommend 7-8 hours of sleep at night. Advised to try melatonin 10 mg for insomnia.     Stress management: Current stressors: domestic, health, world news, finance. She has an established therapist.  -- Recommend to try relaxation techniques such as breathing techniques, listening to music to help sleep at night and yoga stretching throughout the day to help control stress.     Weight gain causing medication: None.   Hx of long term steroid use (prednisone 80 mg daily for lupus).  Hx of Ambien for insomnia years ago.    Medication: Taking medications as instructed. No s/e. Dr Sherryll Burger approved Wellbutrin 300mg  per day (MAX) as her cirrhosis is well compensated. Pt follows closely with hepatology. Pt was unable to fill [employer] barrier. We started low dose ozempic. Pt is taking as directed, tolerating well, denies s/e or sx. Pt wishes to continue current regimen for now. Prescription refills sent. Will consider stopping naltrexone in the future.   CONTINUE Ozempic 0.25 mg weekly injections, Wellbutrin 300 mg BID, Naltrexone 25 mg BID.   - Wellbutrin: pt was started on this for depression before enrolling at our office. ?weight loss on it.  - Naltrexone: started on 06/13/19 at 209 lb.  Tried  - Orlistat: tried and failed.  Contraindicated  - Topamax: contraindicated dhx of kidney stone.  - Phentermine: contraindicated by hx of palpitations.    Obesity Surgery: Pt is interested in this option. However pt's BMI is 36 and rcurently losing 2 lb/wk with good response to medication and healthy lifestyle changes so we will revisit this option if pt gets on the plateau or gains weight above the BMI  35. (updated 10/13/2020)    Medical conditions:  Glaucoma: no  Seizures: no  Medullary thyroid cancer (personal or family hx): Father and PGM both had thyroid goiter. Multiple Endocrine Neoplasia: no  Palpitations/Tachycardia: YES  Chest Pain: no past history of MI.   Headaches/Migraines: YES  Nephrolithiasis: YES  H/o pancreatitis: YES, gallstone pancreatitis and she had cholecystectomy.  GERD: YES    Prior Surgeries:  Choleycystectomy: YES  Hysterectomy: YES    Obesity-associated comorbidities: Cirrhosis, GERD, suspected sleep apnea.  Current barriers include: Anxiety, depression, Sjogren's, lupus, emotional /stress eating, insomnia, fibromyalgia.  Birth Control Methods: N/A; s/p hysterectomy.

## 2020-10-14 NOTE — Unmapped (Unsigned)
[facility] Follow Up    Assessment/Plan:     No chief complaint on file.      Problem List Items Addressed This Visit        Other    Class 1 obesity     [name] is a 45 y.o. 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 Her medical history includes SLE, cirrhosis, GERD, suspected OSA, depression, and emotional / stress eating. Pt wishes weight loss to prevent further liver damage.    Weight Summary:  1. Starting weight/BMI/WC: 209 lb, BMI 39.6 (06/13/19, self report), WC 39 (11/04/19, 1st measure)  2. Target weight/goal: BMI 30 or 159 lb.  3. Today's weight/BMI: 174 lb, BMI 32.9 (10/14/2020)  4. % Body weight loss: 8.1%  5. Today's   (10/14/2020) <= 38.75 (01/07/20)  6. Today's visit number: 7th  7. Duration in program: 16 mo    Referred by: [name], MD in [facility] Medicine.    GOALS: Keep up the great work! Continue regular exercise and reduced sugar intake.    I have reviewed the patient's medical history, lifestyle history and labs/tests.   My recommendations include the following:    Diet quality and patterning: No more soda. One meal per day. Snacks on fruits. Pt is working on finding carbonated waters that she likes. Endorses healthy home cooked meals.  -- Will meet regularly with our nutritionist during the duration of medical management treatment. Discussed about healthier food choices and advised to home cook for less processed meals.    Exercise: Continues to walk outside on track. Gym 2-3x per week with weights and cardio (2-2.5 hr each session).  -- Goal of 30 minutes of aerobic exercise 5x a week or a total of 150 minutes weekly, and 30 minutes of resistance exercise 2x a week or a total of 60 min weekly.     Sleep: Currently sleeping 6 hours without sleep aids. +OSA screening (STOP-BANG 3/8). Pt declined to do sleep study for now and wishes to focus on lifestyle changes.  -- Recommend 7-8 hours of sleep at night. Advised to try melatonin 10 mg for insomnia.     Stress management: Current stressors: domestic, health, world news, finance. She has an established therapist.  -- Recommend to try relaxation techniques such as breathing techniques, listening to music to help sleep at night and yoga stretching throughout the day to help control stress.     Weight gain causing medication: None.   Hx of long term steroid use (prednisone 80 mg daily for lupus).  Hx of Ambien for insomnia years ago.    Medication: Taking medications as instructed. No s/e. Dr Sherryll Burger approved Wellbutrin 300mg  per day (MAX) as her cirrhosis is well compensated. Pt follows closely with hepatology. Pt was unable to fill [employer] barrier. We started low dose ozempic. Pt is taking as directed, tolerating well, denies s/e or sx. Pt wishes to continue current regimen for now. Prescription refills sent. Will consider stopping naltrexone in the future.   CONTINUE Ozempic 0.25 mg weekly injections, Wellbutrin 300 mg BID, Naltrexone 25 mg BID.   - Wellbutrin: pt was started on this for depression before enrolling at our office. ?weight loss on it.  - Naltrexone: started on 06/13/19 at 209 lb.  Tried  - Orlistat: tried and failed.  Contraindicated  - Topamax: contraindicated dhx of kidney stone.  - Phentermine: contraindicated by hx of palpitations.  Obesity Surgery: Pt is interested in this option. However pt's BMI is 36 and rcurently losing 2 lb/wk with good response to medication and healthy lifestyle changes so we will revisit this option if pt gets on the plateau or gains weight above the BMI 35. (updated 10/13/2020)    Medical conditions:  Glaucoma: no  Seizures: no  Medullary thyroid cancer (personal or family hx): Father and PGM both had thyroid goiter. Multiple Endocrine Neoplasia: no  Palpitations/Tachycardia: YES  Chest Pain: no past history of MI.   Headaches/Migraines: YES  Nephrolithiasis: YES  H/o pancreatitis: YES, gallstone pancreatitis and she had cholecystectomy.  GERD: YES    Prior Surgeries:  Choleycystectomy: YES  Hysterectomy: YES    Obesity-associated comorbidities: Cirrhosis, GERD, suspected sleep apnea.  Current barriers include: Anxiety, depression, Sjogren's, lupus, emotional /stress eating, insomnia, fibromyalgia.  Birth Control Methods: N/A; s/p hysterectomy.               No follow-ups on file.    I have reviewed and addressed the patient???s adherence and response to prescribed medications. I have identified patient barriers to following the proposed medication and treatment plan, and have noted opportunities to optimize healthy behaviors. I have answered the patient???s questions to satisfaction and the patient voices understanding.    Time: Greater than 50% of this encounter was spent in direct consultation with the patient in evaluation and discussing all of the above. Duration of encounter: 25 minutes.     HPI:     Tonya Brady is a 45 y.o. female who has a past medical history of Bulging lumbar disc (2005), Cirrhosis of liver (CMS-HCC), Depression, Diabetes mellitus (CMS-HCC), Difficult intravenous access, Fibromyalgia (03/09/2013), NASH (nonalcoholic steatohepatitis), Secondary Sjogren's syndrome (CMS-HCC) (03/09/2013), Systemic lupus erythematosus (CMS-HCC) (01/19/2011), and Valvular regurgitation. who presents today for [facility] follow up.    Weight Management History    Wt Readings from Last 6 Encounters:   07/13/20 79 kg (174 lb 1.3 oz)   06/14/20 81 kg (178 lb 9.6 oz)   03/22/20 88.8 kg (195 lb 12.8 oz)   03/03/20 87.5 kg (193 lb)   01/07/20 87.1 kg (192 lb)   01/06/20 87.1 kg (192 lb)     Waist Circumference 11/04/2019 01/07/2020 07/13/2020   Waist Circumference 39 38.75 35.75     Update: Lost 18 lbs in 6 mo (0.75 lbs/wk). WC down 3 inches.  Medication: Taking Ozempic as directed. Tolerating well, denies s/e or sx.   Diet: Eating one meal per day and snacks on fruits at night while at work  Exercise: Gym 2-3x per week d/t time limitations. Strength training 2x per week, cardio almost every day.   Continues to walk track in the afternoon when picking up grandkids from school--loves to walk   Barrier: Pt has had to start working a second FT job which she reports helps her stress    Lab Results   Component Value Date    LDL Calculated 101 (H) 03/03/2020    LDL Cholesterol, Calculated 104 06/23/2011    HDL 44 03/03/2020    HDL 35 (L) 06/23/2011    Hemoglobin A1C 5.4 03/03/2020    Hemoglobin A1C 5.9 02/10/2016    Hemoglobin A1C 5.5 06/23/2011    Glucose 108 06/14/2020    TSH 2.500 11/10/2015    TSH 1.92 06/23/2011    AST 18 06/14/2020    AST 19 07/26/2010    ALT 15 06/14/2020    ALT 33 07/26/2010  Alkaline Phosphatase 50 06/14/2020     Past Medical/Surgical History:     Past Medical History:   Diagnosis Date   ??? Bulging lumbar disc 2005   ??? Cirrhosis of liver (CMS-HCC)    ??? Depression    ??? Diabetes mellitus (CMS-HCC)    ??? Difficult intravenous access    ??? Fibromyalgia 03/09/2013   ??? NASH (nonalcoholic steatohepatitis)    ??? Secondary Sjogren's syndrome (CMS-HCC) 03/09/2013    Secondary to SLE  Diagnosed: 12/2003 (by sx's, parotid gland enlargement, and serologies) Pathology: biopsy not done Labs: + ANA, anti-SSA, anti-SSB, RF    ??? Systemic lupus erythematosus (CMS-HCC) 01/19/2011   ??? Valvular regurgitation      Past Surgical History:   Procedure Laterality Date   ??? CARPAL TUNNEL RELEASE     ??? CESAREAN SECTION     ??? CHOLECYSTECTOMY     ??? HERNIA REPAIR     ??? HYSTERECTOMY     ??? PR REVISE MEDIAN N/CARPAL TUNNEL SURG Left 12/30/2018    Procedure: R16 NEUROPLASTY AND/OR TRANSPOSITION; MEDIAN NERVE AT CARPAL TUNNEL;  Surgeon: Theodora Blow Jacqlyn Krauss, MD;  [facility]: [facility];  Service: Orthopedics   ??? PR SALIVARY SURG UNLISTED PROC Right 12/06/2018    Procedure: R22 UNLISTED PROC SALIVARY GLANDS/DUCTS;  Surgeon: Michelle Piper, MD;  Location: [facility];  Service: ENT   ??? PR SALIVARY SURG UNLISTED PROC Left 02/18/2019    Procedure: R25 UNLISTED PROC SALIVARY GLANDS/DUCTS;  Surgeon: Michelle Piper, MD;  [facility];  Service: ENT   ??? PR UPPER GI ENDOSCOPY,DIAGNOSIS N/A 05/10/2016    Procedure: UGI ENDO, INCLUDE ESOPHAGUS, STOMACH, & DUODENUM &/OR JEJUNUM; DX W/WO COLLECTION SPECIMN, BY BRUSH OR WASH;  Surgeon: Carmon Ginsberg, MD;  [facility]: [facility];  Service: Gastroenterology   ??? PR UPPER GI ENDOSCOPY,DIAGNOSIS N/A 05/02/2019    Procedure: UGI ENDO, INCLUDE ESOPHAGUS, STOMACH, & DUODENUM &/OR JEJUNUM; DX W/WO COLLECTION SPECIMN, BY BRUSH OR WASH;  Surgeon: Chriss Driver, MD;  Location: [facility];  Service: Gastroenterology   ??? TOTAL ABDOMINAL HYSTERECTOMY     ??? TUBAL LIGATION         Social History:     Social History     Socioeconomic History   ??? Marital status: Married   Tobacco Use   ??? Smoking status: Never Smoker   ??? Smokeless tobacco: Never Used   Vaping Use   ??? Vaping Use: Never used   Substance and Sexual Activity   ??? Alcohol use: No     Alcohol/week: 0.0 standard drinks   ??? Drug use: No   ??? Sexual activity: Not Currently     Partners: Male   Other Topics Concern   ??? Do you use sunscreen? Yes   ??? Tanning bed use? No   ??? Are you easily burned? No   ??? Excessive sun exposure? No   ??? Blistering sunburns? No     Social Determinants of Health     Financial Resource Strain: Low Risk    ??? Difficulty of Paying Living Expenses: Not hard at all   Food Insecurity: No Food Insecurity   ??? Worried About Programme researcher, broadcasting/film/video in the Last Year: Never true   ??? Ran Out of Food in the Last Year: Never true   Transportation Needs: No Transportation Needs   ??? Lack of Transportation (Medical): No   ??? Lack of Transportation (Non-Medical): No   Physical Activity:  Insufficiently Active   ??? Days of Exercise per Week: 3 days   ??? Minutes of Exercise per Session: 30 min   Stress: Stress Concern Present   ??? Feeling of Stress : To some extent   Social Connections: Unknown   ??? Frequency of Communication with Friends and Family: Three times a week   ??? Frequency of Social Gatherings with Friends and Family: Three times a week   ??? Attends Religious Services: Never   ??? Active Member of Clubs or Organizations: No   ??? Attends Banker Meetings: Never       Family History:     Family History   Problem Relation Age of Onset   ??? Cancer Mother    ??? Hypertension Mother    ??? Heart disease Father    ??? Heart attack Father    ??? Depression Son    ??? Mental illness Son    ??? Aneurysm Maternal Aunt    ??? Diabetes Maternal Aunt    ??? Diabetes Maternal Grandmother    ??? Hypertension Maternal Grandmother    ??? Thyroid disease Maternal Grandmother    ??? Breast cancer Maternal Grandmother    ??? Breast cancer Paternal Grandmother    ??? Diabetes Paternal Grandmother    ??? Thyroid disease Paternal Grandmother    ??? Diabetes Maternal Uncle    ??? Heart attack Maternal Uncle    ??? Melanoma Neg Hx    ??? Basal cell carcinoma Neg Hx    ??? Squamous cell carcinoma Neg Hx    ??? Drug abuse Neg Hx    ??? Stroke Neg Hx        Allergies:     Aspirin, Fish containing products, Iodine, and Shellfish containing products    Current Medications:     Current Outpatient Medications   Medication Sig Dispense Refill   ??? albuterol HFA 90 mcg/actuation inhaler INHALE 2 PUFFS BY MOUTH EVERY 6 HOURS AS NEEDED FOR WHEEZING 6.7 g 0   ??? belimumab (BENLYSTA) 200 mg/mL AtIn Inject the contents of 1 injector (200mg ) under the skin every 7 days 4 mL 2   ??? buPROPion (WELLBUTRIN SR) 150 MG 12 hr tablet TAKE 1 TABLET(150 MG) BY MOUTH TWICE DAILY 120 tablet 1   ??? empty container Misc Use as directed 1 each 2   ??? famotidine (PEPCID) 40 MG tablet TAKE 1 TABLET(40 MG) BY MOUTH EVERY NIGHT AS NEEDED FOR HEARTBURN 90 tablet 3   ??? fluticasone propionate (FLONASE) 50 mcg/actuation nasal spray 1 spray into each nostril daily. 16 g 11   ??? hydrOXYchloroQUINE (PLAQUENIL) 200 mg tablet Take 1 tablet (200 mg total) by mouth Two (2) times a day. 180 tablet 1   ??? ibuprofen (MOTRIN) 800 MG tablet ibuprofen 800 mg tablet     ??? inhalational spacing device (AEROCHAMBER MV) Spcr 1 each by Miscellaneous route every six (6) hours as needed. 1 each 0   ??? loratadine (CLARITIN) 10 mg tablet Take 10 mg by mouth daily. Takes as needed     ??? melatonin 1 mg Tab tablet Take by mouth nightly as needed.      ??? naltrexone (DEPADE) 50 mg tablet Take 0.5 tablets (25 mg total) by mouth two (2) times a day. 60 tablet 2   ??? OZEMPIC 0.25 mg or 0.5 mg(2 mg/1.5 mL) PnIj injection INJECT 0.25 MG UNDER THE SKIN EVERY 7 DAYS 1.5 mL 0     No current facility-administered medications for this visit.  I have reviewed and (if needed) updated the patient's problem list, medications, allergies, past medical and surgical history, social and family history.    ROS:     Unless otherwise stated in the HPI:  CONSTITUTIONAL: no fever chills.   HEENT: Eyes: No diplopia or blurred vision. ENT: No earache, sore throat or runny nose.   CARDIOVASCULAR: No pressure, squeezing, strangling, tightness, heaviness or aching about the chest, neck, axilla or epigastrium.   RESPIRATORY: No cough, shortness of breath, PND or orthopnea.   GASTROINTESTINAL: No nausea, vomiting or diarrhea.   GENITOURINARY: No dysuria, frequency or urgency.   MUSCULOSKELETAL: no new pains, no joint swelling or redness.   SKIN: No change in skin, hair or nails.   NEUROLOGIC: No paresthesias, fasciculations, seizures or weakness.   PSYCHIATRIC: No disorder of thought or mood.   ENDOCRINE: No heat or cold intolerance, polyuria or polydipsia.   HEMATOLOGICAL: No easy bruising or bleeding.    Vital Signs:     There is no height or weight on file to calculate BMI.        Wt Readings from Last 3 Encounters:   07/13/20 79 kg (174 lb 1.3 oz)   06/14/20 81 kg (178 lb 9.6 oz)   03/22/20 88.8 kg (195 lb 12.8 oz)     Temp Readings from Last 3 Encounters:   07/13/20 36.9 ??C (98.5 ??F) (Temporal)   06/14/20 36.8 ??C (98.3 ??F) (Tympanic)   03/22/20 36.2 ??C (97.1 ??F) (Temporal)     BP Readings from Last 3 Encounters:   07/13/20 105/70   06/14/20 118/71   03/22/20 109/71     Pulse Readings from Last 3 Encounters:   07/13/20 58   06/14/20 61   03/22/20 60     Waist Circumference 11/04/2019 01/07/2020 07/13/2020   Waist Circumference 39 38.75 35.75     Physical Exam:     General: well appearing, in NAD, There is no height or weight on file to calculate BMI. Ambulatory without help.  Body fat distribution: central & gluteofemoral adiposity. No supraclavicular adiposity. No dorsal adiposity.       Head: normocephalic atraumatic.  Eyes: PERRLA, EOMI, Sclera WNL.   Oral pharynx: moist, no exudate, no erythema, not enlarged. Good dental hygiene. Moderate OP crowding.  Neck: supple, no LAD, no thyromegaly, no bruit.  CV: RRR no rubs or murmurs. Peripheral edema: none.  Lungs: clear bilaterally to ascultation. No wheezing.  Abdomen: soft, NT/ND. No intertrigo. Moderate pannus. No hepatomegaly.   Extremities: no clubbing, cyanosis, No edema.  Skin: no concerning lesions, rashes observed. No lipomas, no acanthosis nigricans.  Neuro: Alert and oriented X 3.    Labs:     No visits with results within 1 Month(s) from this visit.   Latest known visit with results is:   Office Visit on 06/14/2020   Component Date Value Ref Range Status   ??? AFP-Tumor Marker 06/14/2020 2  <=8 ng/mL Final   ??? Albumin 06/14/2020 3.8  3.4 - 5.0 g/dL Final   ??? Total Protein 06/14/2020 6.9  5.7 - 8.2 g/dL Final   ??? Total Bilirubin 06/14/2020 0.4  0.3 - 1.2 mg/dL Final   ??? Bilirubin, Direct 06/14/2020 0.20  0.00 - 0.30 mg/dL Final   ??? AST 45/40/9811 18  <=34 U/L Final   ??? ALT 06/14/2020 15  10 - 49 U/L Final   ??? Alkaline Phosphatase 06/14/2020 50  46 - 116 U/L Final   ??? Sodium 06/14/2020 142  135 -  145 mmol/L Final   ??? Potassium 06/14/2020 4.0  3.5 - 5.1 mmol/L Final   ??? Chloride 06/14/2020 110 (A) 98 - 107 mmol/L Final   ??? CO2 06/14/2020 25.0  20.0 - 31.0 mmol/L Final   ??? Anion Gap 06/14/2020 7  5 - 14 mmol/L Final ??? BUN 06/14/2020 8 (A) 9 - 23 mg/dL Final   ??? Creatinine 06/14/2020 0.77  0.60 - 0.80 mg/dL Final   ??? BUN/Creatinine Ratio 06/14/2020 10   Final   ??? EGFR CKD-EPI Non-African American,* 06/14/2020 >90  >=60 mL/min/1.49m2 Final   ??? EGFR CKD-EPI African American, Fem* 06/14/2020 >90  >=60 mL/min/1.43m2 Final   ??? Glucose 06/14/2020 108  70 - 179 mg/dL Final   ??? Calcium 16/02/9603 9.4  8.7 - 10.4 mg/dL Final   ??? PT 54/01/8118 14.2 (A) 10.3 - 13.4 sec Final   ??? INR 06/14/2020 1.22   Final   ??? WBC 06/14/2020 2.7 (A) 4.5 - 11.0 10*9/L Final   ??? RBC 06/14/2020 4.47  4.00 - 5.20 10*12/L Final   ??? HGB 06/14/2020 14.0  12.0 - 16.0 g/dL Final   ??? HCT 14/78/2956 41.7  36.0 - 46.0 % Final   ??? MCV 06/14/2020 93.4  80.0 - 100.0 fL Final   ??? MCH 06/14/2020 31.3  26.0 - 34.0 pg Final   ??? MCHC 06/14/2020 33.5  31.0 - 37.0 g/dL Final   ??? RDW 21/30/8657 12.9  12.0 - 15.0 % Final   ??? MPV 06/14/2020 11.1 (A) 7.0 - 10.0 fL Final   ??? Platelet 06/14/2020 172  150 - 440 10*9/L Final       Follow-up:     Recommend well adult check/physical in 1 year. Otherwise, follow up as below.  No follow-ups on file.    Health maintenance reviewed and recommendations made based on [place] Preventative Task Force (USPTF) recommendations. Reviewed appropriate diet and exercise. Patient stated understanding and there were no barriers to learning.     I attest that I, Deyon Chizek R Juanisha Bautch, personally documented this note while acting as scribe for [facility], MD.      Arlana Pouch, Scribe.  10/14/2020     The documentation recorded by the scribe accurately reflects the service I personally performed and the decisions made by me.    Marshell Garfinkel, MD

## 2020-10-22 NOTE — Unmapped (Signed)
[facility] Refill Coordination Note    Specialty Medication(s) to be Shipped:   Inflammatory Disorders: Benlysta    Other medication(s) to be shipped: No additional medications requested for fill at this time     Tonya Brady, DOB: 1976/02/05  Phone: [phone] (home)       All above HIPAA information was verified with patient.     Was a Nurse, learning disability used for this call? No    Completed refill call assessment today to schedule patient's medication shipment from the [facility] [phone]).  All relevant notes have been reviewed.     Specialty medication(s) and dose(s) confirmed: Regimen is correct and unchanged.   Changes to medications: [name] reports no changes at this time.  Changes to insurance: No  New side effects reported not previously addressed with a pharmacist or physician: None reported  Questions for the pharmacist: No    Confirmed patient received a Conservation officer, historic buildings and a Surveyor, mining with first shipment. The patient will receive a drug information handout for each medication shipped and additional FDA Medication Guides as required.       DISEASE/MEDICATION-SPECIFIC INFORMATION        For patients on injectable medications: Patient currently has 0 doses left.  Next injection is scheduled for 6/3.    SPECIALTY MEDICATION ADHERENCE     Medication Adherence    Patient reported X missed doses in the last month: 0  Specialty Medication: Benlysta  Patient is on additional specialty medications: No  Patient is on more than two specialty medications: No  Any gaps in refill history greater than 2 weeks in the last 3 months: no  Demonstrates understanding of importance of adherence: yes  Informant: patient              Were doses missed due to medication being on hold? No    Benlysta 200mg /ml: Patient has 0 days of medication on hand    REFERRAL TO PHARMACIST     Referral to the pharmacist: Not needed      [facility]     Shipping address confirmed in Epic. Delivery Scheduled: Yes, Expected medication delivery date: 6/3.     Medication will be delivered via Same Day Courier to the prescription address in Epic WAM.    [name]   [facility] Specialty Technician

## 2020-10-31 NOTE — Unmapped (Signed)
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 Treated with HCQ, prior azathioprine [discontinued 2/2 nausea], MTX [discontinued 2/2 GI effects/NASH]. Initiated belimumab 2020 for MSK pain. Since last visit she is feeling overall clinically well without overt feature to suggest uncontrolled disease.  -- Labs: SLE monitoring labs  -- Medications: Continue HCQ 200mg  BID --> consider dose reduction if she continues to lose weight, belimumab weekly  -- Patient will reach out again if she develops parotid flare-up for repeat sialendoscopy as this was the most helpful intervention for parotid pain flare-ups.    Lateral epicondylitis  Discussed risk factors, typical treatment, prognosis. She is going to start with PT and will let me know if 4-6 week if not improving.  -- Exercises provided; can trial band as well    Healthcare maintenance  -- Vaccinations: 3rd dose covid vaccination today; Evusheld was reviewed and provided with additional information - she will let me know if interested - note: Regeneron caused anaphylaxis per discussion so would need to query if this would preclude Evusheld before ordreing  -- HCQ: eye exam due in 2022 -> she is making follow-up appointment - will need records    RTC in Return in about 6 months (around 05/03/2021).    This patient was seen by [name]. Patient was discussed separately with Dr. Sullivan Lone who agrees with the plan outlined above.    Subjective:   Primary Care Provider: Alexandria Lodge, MD    HPI:  Tonya Brady is a 45 y.o.  female with a PMH of SLE and secondary Sjogrens, history of trigger finger, NASH cirrhosis, GERD, anxiety presenting for follow-up. Last seen in clinic 03/2020 at which time she was clinically stable.    Medical updates:  -- Followed with ortho hand and received CTS wrist injection without much relief. Followed with sports medicine for possible cervical impingement.   -- Followed with hepatology.   -- Followed with weight management clinic.    Today she presents for follow-up.  She continues HCQ and belimumab.    MSK:  -- Right elbow: Over the last few weeks the right lateral elbow has been hurting with use/activity particularly picking up objects with right hand. 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 No swelling or ROM limitations. No paresthesias.  -- UE digits: She notes intermittent R>L MCP pain/stiffness/fullness sensation which exacerbates and is definitely provoked by use and activity. Ibuprofen helps with these symptoms but topical diclofenac was not helpful. In general her UE digits ache with activity and use. She reportedly was told ibuprofen is OK to use at her current dosing per discussion.  -- Other: She notes more diffuse stiffness/discomfort without overt pain following prolonged inactivity/sitting. Symptoms are most noticeable in knees and hips and it takes maybe 5 minutes of moving around again to loosen up the stiffness sensation. Concurrent associated intermittent mild muscle aches/soreness with yardwork or activity.    -- Activity: She had been working 3 jobs for 7 months and was able to de-escalate to 2 jobs as of 3 weeks ago. 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 She successfully lost 60 pounds in 6 years.    Sicca:  -- +Dry mouth is stable. Sees her dentist without concerns.  -- +Dry eyes are stable. Seasonal allergies have  helped with dryness sensation as she gets increased watering with allergies.    Parotids:   -- +Bialteral parotid fullness intermittently with increased congestion in general without enlargement or pain.    Further ROS as below:  -- -fevers, -chills, +rashes [cheek redness with increased time spent outside], +congestion, -chest pain, -shortness of breath, -cough, -abdominal pain, +alternating diarrhea/constipation [chronic], -adenopathy.    Disease History:  -- 2005 presented with bilateral partoid swelling, sicca, pancytopenia. Hospitalized with small pericardial effusion.  Eval at that time with +ANA 1:320 speckled, +SSA, +SSB, +RF 120, -dsDNA. Initiated HCQ.  -- 2007 - 2010 continued PRN prednisone for parotid swelling. Concurrent intermittent antibiotics for concern for concurrent infections. Evaluated in ENT.  -- 2009 trial of MTX for parotid swelling. Hospitalized for abdominal pain & elevated LFTs. MTX discontinued. After discontinuation of MTX developed worsening joint pain & swelling of hands.   -- 2011 developed ACLE/malar rash after discontinuing HCQ. At that time labs were concerning for +ANA 1:640 speckled, -dsDNA. Restarted HCQ.  -- 2012 concern for worsening arthritis pain. Initiated azathioprine while continued HCQ.  -- 2013 patient self-discontinued medications for nausea.  -- 2014 re-presented with malar & anterior throat rash for which HCQ was restarted.  -- 2015-2017 lost to follow-up. Restarted HCQ. Concern for elevated LFTs for which she was evaluated by GI & underwent liver biopsy which confirmed NAFLD/fibrosis.  -- 2018 - 2019 continued on HCQ. Concern for active synovitis on exam promtping steroid taper.   -- 2020 belimumab started for persistent arthralgias, new rash. Underwent bilateral sialendoscopies which improved her salivary movement significantly.    -- 2021-2022 continued belimumab, HCQ.    Treatment:  -- Belimumab [2020 - current]  -- HCQ [2005 - current; intermittent lapses in medication use - current]  -- MTX [2009] - discontinued 2/2 severe GI side effects  -- Azathioprine [2012 - 2013]  -- Multiple prednisone tapers    Labs:  -- As above [not all available dating back to 2005]  -- 2019: +SSA/SSB, +RF 30, -CCP    Imaging:  -- 2019: Xray hands: Nondisplaced fracture of the distal tuft of the left first distal phalanx. No evidence of erosive or inflammatory arthropathy.  -- 2019: Xray L foot: No acute fracture or malalignment. Normal soft tissues.     PMH, PFH, PSH as previously documented.  Medications were reviewed this visit.    Objective:     Vitals:    11/01/20 1056   BP: (P) 102/67   Pulse: (P) 55   Temp: (P) 36.2 ??C   Weight: (P) 76.7 kg (169 lb)     Body mass index is 31.93 kg/m?? (pended).    Physical Exam  -- GENERAL: sitting up; no acute distress; appears comfortable  -- HEAD: atraumatic  -- EYES: normal sclera; conjunctivae are clear; no lacrimal gland enlargement  -- ENT: MMM; no mucosal or nasal lesions; no congestion or rhinorrhea; no posterior pharynx erythema; no TTP or palpable fullness of bilateral parotid glands - more chronic per patient  -- NECK: supple; no palpable lymphadenopathy  -- CARDIO: RRR; no g/r/m  -- PULM: clear to auscultation bilaterally without crackles, wheezing, or coarse breath sounds  -- SKIN: no diffuse rashes; bilateral cheek telangiectasias  -- NEURO: alert; moving all extremities    MSK:  -- Right elbow: clear focal TTP over lateral insertion site; symptoms are provoked by resisted wrist extension; elbow joint is without TTP, swelling, or ROM limitations  -- UE digits: mild TTP over R MCPs  without over swelling, warmth, or ROM limitations  -- Other articulations are without TTP, swelling, or ROM limitations

## 2020-11-01 ENCOUNTER — Ambulatory Visit
Admit: 2020-11-01 | Discharge: 2020-11-02 | Payer: PRIVATE HEALTH INSURANCE | Attending: Internal Medicine | Primary: Internal Medicine

## 2020-11-01 DIAGNOSIS — M7711 Lateral epicondylitis, right elbow: Principal | ICD-10-CM

## 2020-11-01 DIAGNOSIS — Z0000 Encounter for general adult medical examination without abnormal findings: Principal | ICD-10-CM

## 2020-11-01 DIAGNOSIS — M329 Systemic lupus erythematosus, unspecified: Principal | ICD-10-CM

## 2020-11-01 LAB — BASIC METABOLIC PANEL
ANION GAP: 4 mmol/L — ABNORMAL LOW (ref 5–14)
BLOOD UREA NITROGEN: 8 mg/dL — ABNORMAL LOW (ref 9–23)
BUN / CREAT RATIO: 11
CALCIUM: 9.7 mg/dL (ref 8.7–10.4)
CHLORIDE: 105 mmol/L (ref 98–107)
CO2: 28.2 mmol/L (ref 20.0–31.0)
CREATININE: 0.74 mg/dL
EGFR CKD-EPI (2021) FEMALE: >90 mL/min/{1.73_m2} (ref >=60)
GLUCOSE RANDOM: 83 mg/dL (ref 70–179)
POTASSIUM: 4 mmol/L (ref 3.4–4.8)
SODIUM: 137 mmol/L (ref 135–145)

## 2020-11-01 LAB — CBC W/ AUTO DIFF
BASOPHILS ABSOLUTE COUNT: 0 10*9/L (ref 0.0–0.1)
BASOPHILS RELATIVE PERCENT: 0.4 %
EOSINOPHILS ABSOLUTE COUNT: 0 10*9/L (ref 0.0–0.5)
EOSINOPHILS RELATIVE PERCENT: 0.9 %
HEMATOCRIT: 41 % (ref 34.0–44.0)
HEMOGLOBIN: 14.3 g/dL (ref 11.3–14.9)
LYMPHOCYTES ABSOLUTE COUNT: 0.6 10*9/L — ABNORMAL LOW (ref 1.1–3.6)
LYMPHOCYTES RELATIVE PERCENT: 20.8 %
MEAN CORPUSCULAR HEMOGLOBIN CONC: 34.7 g/dL (ref 32.0–36.0)
MEAN CORPUSCULAR HEMOGLOBIN: 31.3 pg (ref 25.9–32.4)
MEAN CORPUSCULAR VOLUME: 90.1 fL (ref 77.6–95.7)
MEAN PLATELET VOLUME: 10 fL (ref 6.8–10.7)
MONOCYTES ABSOLUTE COUNT: 0.4 10*9/L (ref 0.3–0.8)
MONOCYTES RELATIVE PERCENT: 12 %
NEUTROPHILS ABSOLUTE COUNT: 2 10*9/L (ref 1.8–7.8)
NEUTROPHILS RELATIVE PERCENT: 65.9 %
NUCLEATED RED BLOOD CELLS: 0 /100{WBCs} (ref <=4)
PLATELET COUNT: 161 10*9/L (ref 150–450)
RED BLOOD CELL COUNT: 4.56 10*12/L (ref 3.95–5.13)
RED CELL DISTRIBUTION WIDTH: 12.6 % (ref 12.2–15.2)
WBC ADJUSTED: 3.1 10*9/L — ABNORMAL LOW (ref 3.6–11.2)

## 2020-11-01 LAB — URINALYSIS, MACROSCOPIC
BILIRUBIN UA: NEGATIVE
BLOOD UA: NEGATIVE
GLUCOSE UA: NEGATIVE
KETONES UA: NEGATIVE
LEUKOCYTE ESTERASE UA: NEGATIVE
NITRITE UA: POSITIVE — AB
PH UA: 7 (ref 5.0–9.0)
PROTEIN UA: NEGATIVE
SPECIFIC GRAVITY UA: 1.01 (ref 1.005–1.030)
UROBILINOGEN UA: 0.2

## 2020-11-01 LAB — PROTEIN / CREATININE RATIO, URINE
CREATININE, URINE: 65.7 mg/dL
PROTEIN URINE: <6 mg/dL

## 2020-11-01 LAB — C3 COMPLEMENT: C3 COMPLEMENT: 97 mg/dL (ref 90–170)

## 2020-11-01 LAB — C4 COMPLEMENT: C4 COMPLEMENT: 31.2 mg/dL (ref 12.0–36.0)

## 2020-11-02 LAB — ANTI-DNA ANTIBODY, DOUBLE-STRANDED: DSDNA ANTIBODY: NEGATIVE

## 2020-11-03 DIAGNOSIS — N3090 Cystitis, unspecified without hematuria: Principal | ICD-10-CM

## 2020-11-03 DIAGNOSIS — N3000 Acute cystitis without hematuria: Principal | ICD-10-CM

## 2020-11-03 MED ORDER — SULFAMETHOXAZOLE 800 MG-TRIMETHOPRIM 160 MG TABLET
ORAL_TABLET | Freq: Two times a day (BID) | ORAL | 0 refills | 3 days | Status: CP
Start: 2020-11-03 — End: 2020-11-06

## 2020-11-03 NOTE — Unmapped (Signed)
I reviewed the case with the fellow but did not see the patient.  I agree with the assessment and plan as documented in the fellow's note.    Yakira Duquette, MD, MSCI  Assistant Professor of Medicine  Department of Medicine/Division of Rheumatology  [employer] at   [phone] clinic phone  [phone] clinic secure fax

## 2020-11-11 DIAGNOSIS — M329 Systemic lupus erythematosus, unspecified: Principal | ICD-10-CM

## 2020-11-11 MED ORDER — BENLYSTA 200 MG/ML SUBCUTANEOUS AUTO-INJECTOR
SUBCUTANEOUS | 2 refills | 28 days
Start: 2020-11-11 — End: ?

## 2020-11-11 NOTE — Unmapped (Signed)
[facility] Refill Coordination Note    Specialty Medication(s) to be Shipped:   Inflammatory Disorders: Benlysta    Other medication(s) to be shipped: No additional medications requested for fill at this time     Tonya Brady, DOB: July 15, 1975  Phone: [phone] (home)       All above HIPAA information was verified with patient.     Was a Nurse, learning disability used for this call? No    Completed refill call assessment today to schedule patient's medication shipment from the [facility] [phone]).  All relevant notes have been reviewed.     Specialty medication(s) and dose(s) confirmed: Regimen is correct and unchanged.   Changes to medications: Tonya Brady reports no changes at this time.  Changes to insurance: No  New side effects reported not previously addressed with a pharmacist or physician: None reported  Questions for the pharmacist: No    Confirmed patient received a Conservation officer, historic buildings and a Surveyor, mining with first shipment. The patient will receive a drug information handout for each medication shipped and additional FDA Medication Guides as required.       DISEASE/MEDICATION-SPECIFIC INFORMATION        For patients on injectable medications: Patient currently has 1 doses left.  Next injection is scheduled for 11/18/20.    SPECIALTY MEDICATION ADHERENCE     Medication Adherence    Patient reported X missed doses in the last month: 1  Specialty Medication: Benlysta  Patient is on additional specialty medications: No              Were doses missed due to medication being on hold? Yes - provider told patient to due to being sick and taking antibiotic    Benlysta 200 mg/ml: 7 days of medicine on hand        REFERRAL TO PHARMACIST     Referral to the pharmacist: Not needed      [facility]     Shipping address confirmed in Epic.     Delivery Scheduled: Yes, Expected medication delivery date: 11/18/20.  However, Rx request for refills was sent to the provider as there are none remaining.     Medication will be delivered via Same Day Courier to the prescription address in Epic WAM.    Unk Lightning   [facility] Specialty Technician

## 2020-11-12 MED ORDER — BENLYSTA 200 MG/ML SUBCUTANEOUS AUTO-INJECTOR
SUBCUTANEOUS | 2 refills | 28 days | Status: CP
Start: 2020-11-12 — End: ?
  Filled 2020-11-18: qty 4, 28d supply, fill #0

## 2020-11-12 NOTE — Unmapped (Signed)
Benlysta refill    Last Visit Date: 11/01/2020  Next Visit Date: Visit date not found    Lab Results   Component Value Date    ALT 15 06/14/2020    AST 18 06/14/2020    ALBUMIN 3.8 06/14/2020    CREATININE 0.74 11/01/2020     Lab Results   Component Value Date    WBC 3.1 (L) 11/01/2020    HGB 14.3 11/01/2020    HCT 41.0 11/01/2020    PLT 161 11/01/2020     Lab Results   Component Value Date    NEUTROPCT 65.9 11/01/2020    LYMPHOPCT 20.8 11/01/2020    MONOPCT 12.0 11/01/2020    EOSPCT 0.9 11/01/2020    BASOPCT 0.4 11/01/2020

## 2020-11-30 ENCOUNTER — Emergency Department
Admit: 2020-11-30 | Discharge: 2020-11-30 | Disposition: A | Discharge status: Home / Self Care | Payer: PRIVATE HEALTH INSURANCE | Attending: Emergency Medicine

## 2020-11-30 ENCOUNTER — Ambulatory Visit
Admit: 2020-11-30 | Discharge: 2020-11-30 | Disposition: A | Discharge status: Home / Self Care | Payer: PRIVATE HEALTH INSURANCE | Attending: Emergency Medicine

## 2020-11-30 DIAGNOSIS — S161XXA Strain of muscle, fascia and tendon at neck level, initial encounter: Principal | ICD-10-CM

## 2020-11-30 DIAGNOSIS — M436 Torticollis: Principal | ICD-10-CM

## 2020-11-30 LAB — BASIC METABOLIC PANEL
ANION GAP: 7 mmol/L (ref 5–14)
BLOOD UREA NITROGEN: 10 mg/dL (ref 9–23)
BUN / CREAT RATIO: 13
CALCIUM: 9 mg/dL (ref 8.7–10.4)
CHLORIDE: 109 mmol/L — ABNORMAL HIGH (ref 98–107)
CO2: 23 mmol/L (ref 20.0–31.0)
CREATININE: 0.78 mg/dL
EGFR CKD-EPI (2021) FEMALE: >90 mL/min/{1.73_m2} (ref >=60)
GLUCOSE RANDOM: 109 mg/dL (ref 70–179)
POTASSIUM: 4.3 mmol/L (ref 3.4–4.8)
SODIUM: 139 mmol/L (ref 135–145)

## 2020-11-30 MED ORDER — DIAZEPAM 5 MG TABLET
ORAL_TABLET | Freq: Three times a day (TID) | ORAL | 0 refills | 4 days | Status: CP | PRN
Start: 2020-11-30 — End: 2020-12-03

## 2020-11-30 MED ADMIN — lidocaine (LIDODERM) 5 % patch 1 patch: 1 | TRANSDERMAL | @ 16:00:00 | Stop: 2020-11-30

## 2020-11-30 MED ADMIN — ketorolac (TORADOL) injection 15 mg: 15 mg | INTRAVENOUS | @ 16:00:00 | Stop: 2020-11-30

## 2020-11-30 MED ADMIN — MORPhine 4 mg/mL injection 4 mg: 4 mg | INTRAVENOUS | @ 16:00:00 | Stop: 2020-11-30

## 2020-11-30 MED ADMIN — diazePAM (VALIUM) tablet 5 mg: 5 mg | ORAL | @ 12:00:00 | Stop: 2020-11-30

## 2020-11-30 MED ADMIN — iohexoL (OMNIPAQUE) 350 mg iodine/mL solution 50 mL: 50 mL | INTRAVENOUS | @ 16:00:00 | Stop: 2020-11-30

## 2020-11-30 NOTE — Unmapped (Signed)
Pt reports woke at 3 am with a pinching and pulling in neck. Pt denies any other symptoms or concerns

## 2020-11-30 NOTE — Unmapped (Signed)
ED Attending Note  [facility] Emergency Department   I saw the patient on 11/30/2020.      Patient Name: Tonya Brady  Chief Complaint: Neck Pain        Patient Name: Tonya Brady  Chief Complaint: Neck Pain            ED Clinical Impression     Final diagnoses:   Torticollis (Primary)   Strain of neck muscle, initial encounter         Impression, ED Course, Assessment and Plan   Medical Decision Making  Tonya Brady is a 45 y.o. female with a past medical history of lupus (well controlled by medication), fibromyalgia, secondary Sjogren's syndrome, cirrhosis, depression, and DM presenting to the ED today for left neck, left shoulder, and left neck pain that woke her from her sleep at 3 AM this morning and orthostatic dizziness.     On arrival, the patient is alert, oriented, and in NAD. Vital signs are unremarkable. Patient has no tachycardia, tachypnea, or hypoxia. Patient is afebrile. Exam is notable for left sided paraspinal tenderness in the trapezius. No midline cervical spine tenderness. Cannot turn head to left because of pain. No nystagmus. Pupils equally reactive. Extraocular movements intact. No facial droop. Symmetric facial sensations.    Differential includes musculoskeletal strain or torticollis versus cervical radiculopathy, less likely infection given lack of infectious symptoms or risk factors for this.  Also considered cervical artery dissection given intermittent dizziness/vertigo.  Fortunately, neurologic exam here is reassuring.  Given underlying rheumatologic condition, will obtain CTA.      ED Course:   ED Course as of 11/30/20 1329   Tue Nov 30, 2020   1000 Patient reports shellfish allergy.  Discussed with radiology attending that she is safe to receive iodinated contrast for CTA.   1203 CTA Neck W Wo Contrast (Extracranial vessels)     CTA without evidence of vascular dissection or other anomaly.  There is some chronic degenerative changes in the cervical spine, patient was already aware of these and is not having new neurologic symptoms to suggest cord compression or new myelopathy.  She is already working on obtaining an outpatient MRI.  She is feeling improved after interventions here in the ED. Now able to turn head ~40 deg and shake head yes/no. Plan to discharge home with supportive care, NSAIDs, muscle relaxers and close PCP follow-up.  Return precautions discussed.           History   History obtained from: Patient     Tonya Brady is a 45 y.o. female with a PMH of lupus (well controlled by medication), fibromyalgia, secondary Sjogren's syndrome, cirrhosis, depression, and DM presenting for evaluation of neck pain. The left sided neck pain woke her up at 3 am and is a sharp, achy pain.  Pain is localized over the left lateral and posterior neck, worse with movement.  Radiates down her left shoulder and up to her ear The pain is worsened by movements, particularly with turning the head to the left side. Last night, she stood up quickly and felt dizzy/unsteady. Nothing seems to provoke the dizziness and it happens occasionally.  Has not had vision changes, dysarthria or dysphagia, unilateral weakness or numbness.  No syncopal events.  No fever, chills, nausea, vomiting, difficulty swallowing, abdominal pain, vomiting, diarrhea.  No back pain, bowel or bladder symptoms, weakness or numbness in her extremities.  Of note, is undergoing outpatient work-up for degenerative changes in  her cervical spine and is being scheduled for an outpatient MRI.  Denies new weakness or numbness in her upper extremities.    Past Medical/Past Surgical History:   Reviewed in EHR including nursing documentation as outlined. Pertinent PMH/PSH also noted above in HPI.   Past Medical History:   Diagnosis Date   ??? Bulging lumbar disc 2005   ??? Cirrhosis of liver (CMS-HCC)    ??? Depression    ??? Diabetes mellitus (CMS-HCC)    ??? Difficult intravenous access    ??? Fibromyalgia 03/09/2013   ??? NASH (nonalcoholic steatohepatitis)    ??? Secondary Sjogren's syndrome (CMS-HCC) 03/09/2013    Secondary to SLE  Diagnosed: 12/2003 (by sx's, parotid gland enlargement, and serologies) Pathology: biopsy not done Labs: + ANA, anti-SSA, anti-SSB, RF    ??? Systemic lupus erythematosus (CMS-HCC) 01/19/2011   ??? Valvular regurgitation      Patient Active Problem List   Diagnosis   ??? Systemic lupus erythematosus (CMS-HCC)   ??? Allergic rhinitis   ??? Secondary Sjogren's syndrome (CMS-HCC)   ??? Fibromyalgia   ??? Restless leg syndrome   ??? Thrombocytopenia (CMS-HCC)   ??? Palpitations   ??? Chronic abdominal pain   ??? 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 Moderate episode of recurrent major depressive disorder (CMS-HCC)   ??? Gastroesophageal reflux disease   ??? GAD (generalized anxiety disorder)   ??? Acute left ankle pain   ??? Acute carpal tunnel syndrome of left wrist   ??? Annual physical exam   ??? Acquired trigger finger   ??? Swelling of right parotid gland   ??? Swelling of left parotid gland   ??? Acute cystitis without hematuria   ??? Class 1 obesity   ??? Insomnia   ??? COVID-19   ??? Morbid obesity (CMS-HCC)     Past Surgical History:   Procedure Laterality Date   ??? CARPAL TUNNEL RELEASE     ??? CESAREAN SECTION     ??? CHOLECYSTECTOMY     ??? HERNIA REPAIR     ??? HYSTERECTOMY     ??? PR REVISE MEDIAN N/CARPAL TUNNEL SURG Left 12/30/2018    Procedure: R16 NEUROPLASTY AND/OR TRANSPOSITION; MEDIAN NERVE AT CARPAL TUNNEL;  Surgeon: Theodora Blow Jacqlyn Krauss, MD;  Location: [facility] OR [facility];  Service: Orthopedics   ??? PR SALIVARY SURG UNLISTED PROC Right 12/06/2018    Procedure: R22 UNLISTED PROC SALIVARY GLANDS/DUCTS;  Surgeon: Michelle Piper, MD;  Location: [facility] OR [facility];  Service: ENT   ??? PR SALIVARY SURG UNLISTED PROC Left 02/18/2019    Procedure: R25 UNLISTED PROC SALIVARY GLANDS/DUCTS;  Surgeon: Michelle Piper, MD;  Location: [facility] OR [facility];  Service: ENT   ??? PR UPPER GI ENDOSCOPY,DIAGNOSIS N/A 05/10/2016 Procedure: UGI ENDO, INCLUDE ESOPHAGUS, STOMACH, & DUODENUM &/OR JEJUNUM; DX W/WO COLLECTION SPECIMN, BY BRUSH OR WASH;  Surgeon: Carmon Ginsberg, MD;  [facility]: [facility];  Service: Gastroenterology   ??? PR UPPER GI ENDOSCOPY,DIAGNOSIS N/A 05/02/2019    Procedure: UGI ENDO, INCLUDE ESOPHAGUS, STOMACH, & DUODENUM &/OR JEJUNUM; DX W/WO COLLECTION SPECIMN, BY BRUSH OR WASH;  Surgeon: Chriss Driver, MD;  [facility]: [facility];  Service: Gastroenterology   ??? TOTAL ABDOMINAL HYSTERECTOMY     ??? TUBAL LIGATION         Social History/Family History:   Reviewed in EMR, I agree with nursing documentation. Additional pertinent social and family history noted in HPI.   Social History  Tobacco Use   ??? Smoking status: Never Smoker   ??? Smokeless tobacco: Never Used   Vaping Use   ??? Vaping Use: Never used   Substance Use Topics   ??? Alcohol use: No     Alcohol/week: 0.0 standard drinks   ??? Drug use: No     Family History   Problem Relation Age of Onset   ??? Cancer Mother    ??? Hypertension Mother    ??? Heart disease Father    ??? Heart attack Father    ??? Depression Son    ??? Mental illness Son    ??? Aneurysm Maternal Aunt    ??? Diabetes Maternal Aunt    ??? Diabetes Maternal Grandmother    ??? Hypertension Maternal Grandmother    ??? Thyroid disease Maternal Grandmother    ??? Breast cancer Maternal Grandmother    ??? Breast cancer Paternal Grandmother    ??? Diabetes Paternal Grandmother    ??? Thyroid disease Paternal Grandmother    ??? Diabetes Maternal Uncle    ??? Heart attack Maternal Uncle    ??? Melanoma Neg Hx    ??? Basal cell carcinoma Neg Hx    ??? Squamous cell carcinoma Neg Hx    ??? Drug abuse Neg Hx    ??? Stroke Neg Hx        ROS  A review of systems reviewed and are negative except as stated in the HPI or noted below.   Constitutional; Neg for fevers   Eyes: Neg for vision changes. Neg for blurry vision.   Ears, nose, mouth, throat: Neg for sore throat  Cardiovascular: Neg for chest pain.   Respiratory: Neg for cough. Neg for shortness of breath.   Gastrointestinal: Neg for abdominal pain. Neg for nausea or vomiting.   Genitourinary: Neg for dysuria. Neg for hematuria  Musculoskeletal: Positive for left neck, left shoulder, and left head pain. Neg for joint pain. Neg for swelling   Skin: Neg for rashes  Neurological: Positive for orthostatic dizziness. Neg for focal numbness or weakness    Home Medications:    Current Facility-Administered Medications:   ???  lidocaine (LIDODERM) 5 % patch 1 patch, 1 patch, Transdermal, Once, Bryson Ha, MD, 1 patch at 11/30/20 1138    Current Outpatient Medications:   ???  belimumab (BENLYSTA) 200 mg/mL AtIn, Inject the contents of 1 injector (200mg ) under the skin every 7 days, Disp: 4 mL, Rfl: 2  ???  buPROPion (WELLBUTRIN SR) 150 MG 12 hr tablet, TAKE 1 TABLET(150 MG) BY MOUTH TWICE DAILY, Disp: 120 tablet, Rfl: 1  ???  diazePAM (VALIUM) 5 MG tablet, Take 1 tablet (5 mg total) by mouth every eight (8) hours as needed for anxiety for up to 3 days. Take 1/2 to 1 tablet every 8 hours for pain. Do not drive or operate machinery after taking., Disp: 10 tablet, Rfl: 0  ???  empty container Misc, Use as directed, Disp: 1 each, Rfl: 2  ???  famotidine (PEPCID) 40 MG tablet, TAKE 1 TABLET(40 MG) BY MOUTH EVERY NIGHT AS NEEDED FOR HEARTBURN, Disp: 90 tablet, Rfl: 3  ???  fluticasone propionate (FLONASE) 50 mcg/actuation nasal spray, 1 spray into each nostril daily., Disp: 16 g, Rfl: 11  ???  hydrOXYchloroQUINE (PLAQUENIL) 200 mg tablet, Take 1 tablet (200 mg total) by mouth Two (2) times a day., Disp: 180 tablet, Rfl: 1  ???  ibuprofen (MOTRIN) 800 MG tablet, ibuprofen 800 mg tablet, Disp: , Rfl:   ???  inhalational spacing device (AEROCHAMBER MV) Spcr, 1 each by Miscellaneous route every six (6) hours as needed., Disp: 1 each, Rfl: 0  ???  loratadine (CLARITIN) 10 mg tablet, Take 10 mg by mouth daily. Takes as needed, Disp: , Rfl:   ???  melatonin 1 mg Tab tablet, Take by mouth nightly as needed. , Disp: , Rfl:   ???  naltrexone (DEPADE) 50 mg tablet, Take 0.5 tablets (25 mg total) by mouth two (2) times a day., Disp: 60 tablet, Rfl: 2  ???  OZEMPIC 0.25 mg or 0.5 mg(2 mg/1.5 mL) PnIj injection, INJECT 0.25 MG UNDER THE SKIN EVERY 7 DAYS, Disp: 1.5 mL, Rfl: 0    ALLERGIES:   Aspirin, Fish containing products, Iodine, and Shellfish containing products       Physical Exam     ED Triage Vitals [11/30/20 0718]   Enc Vitals Group      BP 136/76      Pulse       SpO2 Pulse 59      Resp 18      Temp 36.8 ??C (98.2 ??F)      Temp Source Oral      SpO2 100 %       Vital signs reviewed.  GENERAL: Patient is uncomfortable due to pain, will not turn head to the left because of pain  HEENT: airway is patent, moist mucus membranes, oropharynx clear without lesions, exudates, thrush  EYES: pupils equal and reactive to light bilaterally, no scleral icterus, conjunctivae clear.   NECK: trachea midline, no cervical lymphadenopathy. Left sided paraspinal tenderness in the trapezius and sternocleidomastoid.  No midline cervical spine tenderness. Cannot turn head to left because of pain.  No erythema or pain with range of motion  CARDIAC: normal rate, regular rhythm, normal S1 and S2, 2+ peripheral pulses, normal capillary refill  PULMONARY: normal work of breathing on room air, good air movement bilaterally, lungs are clear to auscultation bilaterally without wheezes, crackles, rhonchi  ABDOMINAL: normal bowel sounds, abdomen soft, nontender, nondistended, no hepatomegaly or splenomegaly  MSK: no joint deformity or swelling, moves all 4 extremities without difficulty, no lower extremity edema  NEURO: alert and oriented ??3, normal mental status, PERRL, EOMI, 5-5 strength in bilateral upper extremities to shoulder shrug, triceps, biceps, elbow extension and flexion, wrist flexion extension, intrinsic hand. No nystagmus.  Cranial nerves are intact, normal gait, no nystagmus  SKIN: Warm and dry without rashes  PSYCH: Mood and affect are normal Labs and Radiology     Labs Reviewed   BASIC METABOLIC PANEL - Abnormal; Notable for the following components:       Result Value    Chloride 109 (*)     All other components within normal limits     CTA Neck W Wo Contrast (Extracranial vessels)   Final Result   -- Normal CTA of the neck.      --Degenerative cervical spondylosis, most pronounced at C4-C5.                  Procedures   Procedures     ED Treatment   Medications given in the ED:   Medications   lidocaine (LIDODERM) 5 % patch 1 patch (1 patch Transdermal Patch Applied 11/30/20 1138)   diazePAM (VALIUM) tablet 5 mg (5 mg Oral Given 11/30/20 0810)   ketorolac (TORADOL) injection 15 mg (15 mg Intravenous Given 11/30/20 1140)   MORPhine 4 mg/mL injection 4 mg (4 mg Intravenous Given 11/30/20 1144)  iohexoL (OMNIPAQUE) 350 mg iodine/mL solution 50 mL (50 mL Intravenous Given 11/30/20 1140)               Portions of this record have been created using [employer]. Dictation errors have been sought, but may not have been identified and corrected.     Documentation assistance was provided by Gar Gibbon, Scribe, on November 30, 2020 at 08:22 for Army Melia, MD    Documentation assistance was provided by the scribe in my presence.  The documentation recorded by the scribe has been reviewed by me and accurately reflects the services I personally performed.           Bryson Ha, MD  11/30/20 1329

## 2020-12-07 ENCOUNTER — Ambulatory Visit
Admit: 2020-12-07 | Discharge: 2020-12-08 | Payer: PRIVATE HEALTH INSURANCE | Attending: Family Medicine | Primary: Family Medicine

## 2020-12-07 DIAGNOSIS — K7460 Unspecified cirrhosis of liver: Principal | ICD-10-CM

## 2020-12-07 DIAGNOSIS — K7581 Nonalcoholic steatohepatitis (NASH): Principal | ICD-10-CM

## 2020-12-07 DIAGNOSIS — E669 Obesity, unspecified: Principal | ICD-10-CM

## 2020-12-07 MED ORDER — NALTREXONE 50 MG TABLET
ORAL_TABLET | Freq: Two times a day (BID) | ORAL | 2 refills | 60.00000 days | Status: CP
Start: 2020-12-07 — End: ?

## 2020-12-07 MED ORDER — BUPROPION HCL XL 300 MG 24 HR TABLET, EXTENDED RELEASE
ORAL_TABLET | Freq: Every morning | ORAL | 2 refills | 60.00000 days | Status: CP
Start: 2020-12-07 — End: 2021-12-07

## 2020-12-07 MED ORDER — OZEMPIC 0.25 MG OR 0.5 MG (2 MG/1.5 ML) SUBCUTANEOUS PEN INJECTOR
SUBCUTANEOUS | 1 refills | 147.00000 days | Status: CP
Start: 2020-12-07 — End: 2020-12-07

## 2020-12-07 MED ORDER — WEGOVY 0.25 MG/0.5 ML SUBCUTANEOUS PEN INJECTOR
SUBCUTANEOUS | 2 refills | 0.00000 days | Status: CP
Start: 2020-12-07 — End: ?

## 2020-12-07 NOTE — Unmapped (Signed)
[facility] Follow Up    Assessment/Plan:     Chief Complaint   Patient presents with   ??? Weight Loss     No main concerns        Problem List Items Addressed This Visit        Digestive    Liver cirrhosis secondary to NASH (nonalcoholic steatohepatitis) (CMS-HCC) - Primary     Following with [facility] (Dr. Sherryll Burger) and getting [place] q58mo for tumor surveillance as well as LFTs. We will continue to work on aggressive weight loss with lifestyle modifications and pharmacotherapy--See Class 1 obesity. Continue to follow Specialist closely.               Other    Class 1 obesity     Tonya Brady is a 45 y.o. 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 Her medical history includes SLE, cirrhosis, GERD, suspected OSA, depression, and emotional / stress eating. Pt wishes weight loss to prevent further liver damage.    Weight Summary:  1. Starting weight/BMI/WC: 209 lb, BMI 39.6 (06/13/19, self report), WC 39 (11/04/19, 1st measure)  2. Target weight/goal: BMI 30 or 159 lb.  3. Today's weight/BMI: 81.2 kg (179 lb), BMI (!) 33.84 (12/07/2020)   4. % Body weight loss: 8.1%  5. Today's Waist Circumference: 36 inches (12/07/2020) <= 38.75 (01/07/20)  6. Today's visit number: 7th  7. Duration in program: 18 mo    Referred by: [name], MD in [facility].    GOALS: 1) Continue regular exercise and reduced sugar intake  2) Recover from pinched nerve  3) Restart semaglutide    I have reviewed the patient's medical history, lifestyle history and labs/tests.   My recommendations include the following:    Diet quality and patterning: No more soda. One meal per day. Snacks on fruits. Pt is working on finding carbonated waters that she likes. Endorses healthy home cooked meals.  -- Will meet regularly with our nutritionist during the duration of medical management treatment. Discussed about healthier food choices and advised to home cook for less processed meals.    Exercise: Continues to walk outside on track. Gym 2-3x per week with weights and cardio (2-2.5 hr each session).  -- Goal of 30 minutes of aerobic exercise 5x a week or a total of 150 minutes weekly, and 30 minutes of resistance exercise 2x a week or a total of 60 min weekly.     Sleep: Currently sleeping 6 hours without sleep aids. +OSA screening (STOP-BANG 3/8). Pt declined to do sleep study for now and wishes to focus on lifestyle changes.  -- Recommend 7-8 hours of sleep at night. Advised to try melatonin 10 mg for insomnia.     Stress management: Current stressors: domestic, health, world news, finance. She has an established therapist.  -- Recommend to try relaxation techniques such as breathing techniques, listening to music to help sleep at night and yoga stretching throughout the day to help control stress.     Weight gain causing medication: None.   Hx of long term steroid use (prednisone 80 mg daily for lupus).  Hx of Ambien for insomnia years ago.    Medication: Taking medications as instructed. No s/e. Dr Sherryll Burger approved Wellbutrin 300mg  per day (MAX) as her cirrhosis is well compensated. D/t difficulty sleep difficulty, we discussed and decided to try Wellbutrin slow release once daily. Pt verbalized understanding and agreement  with plan. Prescription sent. Will follow. Pt follows closely with hepatology. Pt was unable to fill [employer] barrier. Pt wishes to continue current regimen. We will try a prescription for Wegovy to see if insurance will cover. Prescription sent. Will consider stopping naltrexone in the future.   START Wellbutrin XL 300 mg once daily CONTINUE semaglutide 0.25 mg weekly injections, Naltrexone 25 mg BID.   - Wellbutrin: pt was started on this for depression before enrolling at our office. ?weight loss on it.  - Naltrexone: started on 06/13/19 at 209 lb.  - Ozempic (semaglutide): started on 03/03/20 at 193 lbs.  Tried  - Orlistat: tried and failed.  Contraindicated  - Topamax: contraindicated dhx of kidney stone.  - Phentermine: contraindicated by hx of palpitations.    Obesity Surgery: Pt is interested in this option. However pt's BMI is now 33 so she does not meet criteria. Good response to medication and healthy lifestyle changes so we will revisit this option if pt gets on the plateau or gains weight above the BMI 35. (updated 12/07/2020)    Medical conditions:  Glaucoma: no  Seizures: no  Medullary thyroid cancer (personal or family hx): Father and PGM both had thyroid goiter. Multiple Endocrine Neoplasia: no  Palpitations/Tachycardia: YES  Chest Pain: no past history of MI.   Headaches/Migraines: YES  Nephrolithiasis: YES  H/o pancreatitis: YES, gallstone pancreatitis and she had cholecystectomy.  GERD: YES    Prior Surgeries:  Choleycystectomy: YES  Hysterectomy: YES    Obesity-associated comorbidities: Cirrhosis, GERD, suspected sleep apnea.  Current barriers include: Anxiety, depression, Sjogren's, lupus, emotional /stress eating, insomnia, fibromyalgia, pinched nerve  Birth Control Methods: N/A; s/p hysterectomy.                  Return in about 2 months (around 02/07/2021) for Weight clinic F/U one slot..    I have reviewed and addressed the patient???s adherence and response to prescribed medications. I have identified patient barriers to following the proposed medication and treatment plan, and have noted opportunities to optimize healthy behaviors. I have answered the patient???s questions to satisfaction and the patient voices understanding.    Time: Greater than 50% of this encounter was spent in direct consultation with the patient in evaluation and discussing all of the above. Duration of encounter: 30 minutes.     HPI:     Tonya Brady is a 45 y.o. year old female who has a  has a past medical history of Bulging lumbar disc (2005), Cirrhosis of liver (CMS-HCC), Depression, Diabetes mellitus (CMS-HCC), Difficult intravenous access, Fibromyalgia (03/09/2013), NASH (nonalcoholic steatohepatitis), Secondary Sjogren's syndrome (CMS-HCC) (03/09/2013), Systemic lupus erythematosus (CMS-HCC) (01/19/2011), and Valvular regurgitation. who presents today for [facility] follow up.    Weight Management History    Wt Readings from Last 6 Encounters:   12/07/20 81.2 kg (179 lb)   11/01/20 (P) 76.7 kg (169 lb)   07/13/20 79 kg (174 lb 1.3 oz)   06/14/20 81 kg (178 lb 9.6 oz)   03/22/20 88.8 kg (195 lb 12.8 oz)   03/03/20 87.5 kg (193 lb)     Waist Circumference 11/04/2019 01/07/2020 07/13/2020 12/07/2020   Waist Circumference 39 38.75 35.75 36          Update: 5 lb gain in 5 mos. Insurance Counselling psychologist) is no longer covering Ozempic. Was trying to pay out of pocket with coupon.    Recently had a pinched disc causing numbness in arms. On Valium which  makes her loopy.     Medication: Had to stop taking Ozempic d/t insurance no longer covering it. Will try prescription for Wegovy to see if it will be covered. Pt experiencing difficulty sleeping. Trying Wellbutrin extended release to see if it helps.    Eating Pattern: After stopping Ozempic (insurance barrier), now feels like she wants to eat all day long. Feels hungry. Makes healthy choices but still feels like she is overeating.     Physical Activity: Hasn't been able to go to the gym d/t pinched nerve    Barrier: Pinched nerve. Pt has had to start working a second FT job which she reports helps her stress    Lab Results   Component Value Date    LDL Calculated 101 (H) 03/03/2020    LDL Cholesterol, Calculated 104 06/23/2011    HDL 44 03/03/2020    HDL 35 (L) 06/23/2011    Hemoglobin A1C 5.4 03/03/2020    Hemoglobin A1C 5.9 02/10/2016    Hemoglobin A1C 5.5 06/23/2011    Glucose 109 11/30/2020    TSH 2.500 11/10/2015    TSH 1.92 06/23/2011    AST 18 06/14/2020    AST 19 07/26/2010    ALT 15 06/14/2020    ALT 33 07/26/2010    Alkaline Phosphatase 50 06/14/2020     Past Medical/Surgical History:     Past Medical History:   Diagnosis Date   ??? Bulging lumbar disc 2005   ??? Cirrhosis of liver (CMS-HCC)    ??? Depression    ??? Diabetes mellitus (CMS-HCC)    ??? Difficult intravenous access    ??? Fibromyalgia 03/09/2013   ??? NASH (nonalcoholic steatohepatitis)    ??? Secondary Sjogren's syndrome (CMS-HCC) 03/09/2013    Secondary to SLE  Diagnosed: 12/2003 (by sx's, parotid gland enlargement, and serologies) Pathology: biopsy not done Labs: + ANA, anti-SSA, anti-SSB, RF    ??? Systemic lupus erythematosus (CMS-HCC) 01/19/2011   ??? Valvular regurgitation      Past Surgical History:   Procedure Laterality Date   ??? CARPAL TUNNEL RELEASE     ??? CESAREAN SECTION     ??? CHOLECYSTECTOMY     ??? HERNIA REPAIR     ??? HYSTERECTOMY     ??? PR REVISE MEDIAN N/CARPAL TUNNEL SURG Left 12/30/2018    Procedure: R16 NEUROPLASTY AND/OR TRANSPOSITION; MEDIAN NERVE AT CARPAL TUNNEL;  Surgeon: Theodora Blow Jacqlyn Krauss, MD;  Location: ASC [facility];  Service: Orthopedics   ??? PR SALIVARY SURG UNLISTED PROC Right 12/06/2018    Procedure: R22 UNLISTED PROC SALIVARY GLANDS/DUCTS;  Surgeon: Michelle Piper, MD;  Location: ASC [facility];  Service: ENT   ??? PR SALIVARY SURG UNLISTED PROC Left 02/18/2019    Procedure: R25 UNLISTED PROC SALIVARY GLANDS/DUCTS;  Surgeon: Michelle Piper, MD;  Location: ASC OR [facility];  Service: ENT   ??? PR UPPER GI ENDOSCOPY,DIAGNOSIS N/A 05/10/2016    Procedure: UGI ENDO, INCLUDE ESOPHAGUS, STOMACH, & DUODENUM &/OR JEJUNUM; DX W/WO COLLECTION SPECIMN, BY BRUSH OR WASH;  Surgeon: Carmon Ginsberg, MD;  [facility];  Service: Gastroenterology   ??? PR UPPER GI ENDOSCOPY,DIAGNOSIS N/A 05/02/2019    Procedure: UGI ENDO, INCLUDE ESOPHAGUS, STOMACH, & DUODENUM &/OR JEJUNUM; DX W/WO COLLECTION SPECIMN, BY BRUSH OR WASH;  Surgeon: Chriss Driver, MD;  [facility];  Service: Gastroenterology   ??? TOTAL ABDOMINAL HYSTERECTOMY     ??? TUBAL LIGATION         Social  History:     Social History Socioeconomic History   ??? Marital status: Married     Spouse name: None   ??? Number of children: None   ??? Years of education: None   ??? Highest education level: None   Tobacco Use   ??? Smoking status: Never Smoker   ??? Smokeless tobacco: Never Used   Vaping Use   ??? Vaping Use: Never used   Substance and Sexual Activity   ??? Alcohol use: No     Alcohol/week: 0.0 standard drinks   ??? Drug use: No   ??? Sexual activity: Not Currently     Partners: Male   Other Topics Concern   ??? Do you use sunscreen? Yes   ??? Tanning bed use? No   ??? Are you easily burned? No   ??? Excessive sun exposure? No   ??? Blistering sunburns? No       Family History:     Family History   Problem Relation Age of Onset   ??? Cancer Mother    ??? Hypertension Mother    ??? Heart disease Father    ??? Heart attack Father    ??? Depression Son    ??? Mental illness Son    ??? Aneurysm Maternal Aunt    ??? Diabetes Maternal Aunt    ??? Diabetes Maternal Grandmother    ??? Hypertension Maternal Grandmother    ??? Thyroid disease Maternal Grandmother    ??? Breast cancer Maternal Grandmother    ??? Breast cancer Paternal Grandmother    ??? Diabetes Paternal Grandmother    ??? Thyroid disease Paternal Grandmother    ??? Diabetes Maternal Uncle    ??? Heart attack Maternal Uncle    ??? Melanoma Neg Hx    ??? Basal cell carcinoma Neg Hx    ??? Squamous cell carcinoma Neg Hx    ??? Drug abuse Neg Hx    ??? Stroke Neg Hx        Allergies:     Aspirin, Fish containing products, Iodine, and Shellfish containing products    Current Medications:     Current Outpatient Medications   Medication Sig Dispense Refill   ??? belimumab (BENLYSTA) 200 mg/mL AtIn Inject the contents of 1 injector (200mg ) under the skin every 7 days 4 mL 2   ??? empty container Misc Use as directed 1 each 2   ??? famotidine (PEPCID) 40 MG tablet TAKE 1 TABLET(40 MG) BY MOUTH EVERY NIGHT AS NEEDED FOR HEARTBURN 90 tablet 3   ??? fluticasone propionate (FLONASE) 50 mcg/actuation nasal spray 1 spray into each nostril daily. 16 g 11   ??? hydrOXYchloroQUINE (PLAQUENIL) 200 mg tablet Take 1 tablet (200 mg total) by mouth Two (2) times a day. 180 tablet 1   ??? ibuprofen (MOTRIN) 800 MG tablet ibuprofen 800 mg tablet     ??? inhalational spacing device (AEROCHAMBER MV) Spcr 1 each by Miscellaneous route every six (6) hours as needed. 1 each 0   ??? loratadine (CLARITIN) 10 mg tablet Take 10 mg by mouth daily. Takes as needed     ??? melatonin 1 mg Tab tablet Take by mouth nightly as needed.      ??? buPROPion (WELLBUTRIN XL) 300 MG 24 hr tablet Take 1 tablet (300 mg total) by mouth every morning. 60 tablet 2   ??? naltrexone (DEPADE) 50 mg tablet Take 0.5 tablets (25 mg total) by mouth two (2) times a day. 60 tablet 2   ??? WEGOVY 0.25  MG/0.5 ML SUBCUTANEOUS PEN INJECTOR Inject 0.25 mg under the skin every seven (7) days. 4 each 2     No current facility-administered medications for this visit.       I have reviewed and (if needed) updated the patient's problem list, medications, allergies, past medical and surgical history, social and family history.    ROS:     Unless otherwise stated in the HPI:  CONSTITUTIONAL: no fever chills.   HEENT: Eyes: No diplopia or blurred vision. ENT: No earache, sore throat or runny nose.   CARDIOVASCULAR: No pressure, squeezing, strangling, tightness, heaviness or aching about the chest, neck, axilla or epigastrium.   RESPIRATORY: No cough, shortness of breath, PND or orthopnea.   GASTROINTESTINAL: No nausea, vomiting or diarrhea.   GENITOURINARY: No dysuria, frequency or urgency.   MUSCULOSKELETAL: no new pains, no joint swelling or redness.   SKIN: No change in skin, hair or nails.   NEUROLOGIC: No paresthesias, fasciculations, seizures or weakness.   PSYCHIATRIC: No disorder of thought or mood.   ENDOCRINE: No heat or cold intolerance, polyuria or polydipsia.   HEMATOLOGICAL: No easy bruising or bleeding.    Vital Signs:     Body mass index is 33.84 kg/m??.  Waist Circumference: 36 inches     Wt Readings from Last 3 Encounters: 12/07/20 81.2 kg (179 lb)   11/01/20 (P) 76.7 kg (169 lb)   07/13/20 79 kg (174 lb 1.3 oz)     Temp Readings from Last 3 Encounters:   12/07/20 35.6 ??C (96 ??F) (Temporal)   11/30/20 36.8 ??C (98.2 ??F) (Oral)   11/01/20 (P) 36.2 ??C (97.2 ??F)     BP Readings from Last 3 Encounters:   12/07/20 101/66   11/30/20 95/58   11/01/20 (P) 102/67     Pulse Readings from Last 3 Encounters:   12/07/20 65   11/30/20 60   11/01/20 (P) 55     Waist Circumference 11/04/2019 01/07/2020 07/13/2020 12/07/2020   Waist Circumference 39 38.75 35.75 36     Physical Exam:     General: well appearing, in NAD, Body mass index is 33.84 kg/m??. Ambulatory without help.  Body fat distribution: central & gluteofemoral adiposity. No supraclavicular adiposity. No dorsal adiposity. Waist Circumference: 36 inches     Head: normocephalic atraumatic.  Eyes: PERRLA, EOMI, Sclera WNL.   Oral pharynx: moist, no exudate, no erythema, not enlarged. Good dental hygiene. Moderate OP crowding.  Neck: supple, no LAD, no thyromegaly, no bruit.  CV: RRR no rubs or murmurs. Peripheral edema: none.  Lungs: clear bilaterally to ascultation. No wheezing.  Abdomen: soft, NT/ND. No intertrigo. Moderate pannus. No hepatomegaly.   Extremities: no clubbing, cyanosis, No edema.  Skin: no concerning lesions, rashes observed. No lipomas, no acanthosis nigricans.  Neuro: Alert and oriented X 3.    Labs:     Admission on 11/30/2020, Discharged on 11/30/2020   Component Date Value Ref Range Status   ??? Sodium 11/30/2020 139  135 - 145 mmol/L Final   ??? Potassium 11/30/2020 4.3  3.4 - 4.8 mmol/L Final   ??? Chloride 11/30/2020 109 (A) 98 - 107 mmol/L Final   ??? CO2 11/30/2020 23.0  20.0 - 31.0 mmol/L Final   ??? Anion Gap 11/30/2020 7  5 - 14 mmol/L Final   ??? BUN 11/30/2020 10  9 - 23 mg/dL Final   ??? Creatinine 11/30/2020 0.78  0.60 - 0.80 mg/dL Final   ??? BUN/Creatinine Ratio 11/30/2020 13   Final   ???  eGFR CKD-EPI (2021) Female 11/30/2020 >90  >=60 mL/min/1.63m2 Final    eGFR calculated with CKD-EPI 2021 equation in accordance with [employer] and [employer] of Nephrology Task Force recommendations.   ??? Glucose 11/30/2020 109  70 - 179 mg/dL Final   ??? Calcium 16/02/9603 9.0  8.7 - 10.4 mg/dL Final       Follow-up:     Recommend well adult check/physical in 1 year. Otherwise, follow up as below.  Return in about 2 months (around 02/07/2021) for Weight clinic F/U one slot.Marland Kitchen    Health maintenance reviewed and recommendations made based on [place] Preventative Task Force (USPTF) recommendations. Reviewed appropriate diet and exercise. Patient stated understanding and there were no barriers to learning.     I attest that I, Constance Goltz, personally documented this note while acting as scribe for Marshell Garfinkel, MD.      Constance Goltz, Scribe.  12/07/2020     The documentation recorded by the scribe accurately reflects the service I personally performed and the decisions made by me.    Marshell Garfinkel, MD

## 2020-12-07 NOTE — Unmapped (Signed)
Following with [facility] (Dr. Sherryll Burger) and getting [place] q69mo for tumor surveillance as well as LFTs. We will continue to work on aggressive weight loss with lifestyle modifications and pharmacotherapy--See Class 1 obesity. Continue to follow Specialist closely.

## 2020-12-07 NOTE — Unmapped (Addendum)
Tonya Brady is a 45 y.o. WF with Class 2 obesity due to long term steroid use (dx of SLE since age 49). Her medical history includes SLE, cirrhosis, GERD, suspected OSA, depression, and emotional / stress eating. Pt wishes weight loss to prevent further liver damage.    Weight Summary:  1. Starting weight/BMI/WC: 209 lb, BMI 39.6 (06/13/19, self report), WC 39 (11/04/19, 1st measure)  2. Target weight/goal: BMI 30 or 159 lb.  3. Today's weight/BMI: 81.2 kg (179 lb), BMI (!) 33.84 (12/07/2020)   4. % Body weight loss: 8.1%  5. Today's Waist Circumference: 36 inches (12/07/2020) <= 38.75 (01/07/20)  6. Today's visit number: 7th  7. Duration in program: 18 mo    Referred by: [name], MD in [facility].    GOALS: 1) Continue regular exercise and reduced sugar intake  2) Recover from pinched nerve  3) Restart semaglutide    I have reviewed the patient's medical history, lifestyle history and labs/tests.   My recommendations include the following:    Diet quality and patterning: No more soda. One meal per day. Snacks on fruits. Pt is working on finding carbonated waters that she likes. Endorses healthy home cooked meals.  -- Will meet regularly with our nutritionist during the duration of medical management treatment. Discussed about healthier food choices and advised to home cook for less processed meals.    Exercise: Continues to walk outside on track. Gym 2-3x per week with weights and cardio (2-2.5 hr each session).  -- Goal of 30 minutes of aerobic exercise 5x a week or a total of 150 minutes weekly, and 30 minutes of resistance exercise 2x a week or a total of 60 min weekly.     Sleep: Currently sleeping 6 hours without sleep aids. +OSA screening (STOP-BANG 3/8). Pt declined to do sleep study for now and wishes to focus on lifestyle changes.  -- Recommend 7-8 hours of sleep at night. Advised to try melatonin 10 mg for insomnia.     Stress management: Current stressors: domestic, health, world news, finance. She has an established therapist.  -- Recommend to try relaxation techniques such as breathing techniques, listening to music to help sleep at night and yoga stretching throughout the day to help control stress.     Weight gain causing medication: None.   Hx of long term steroid use (prednisone 80 mg daily for lupus).  Hx of Ambien for insomnia years ago.    Medication: Taking medications as instructed. No s/e. Dr Sherryll Burger approved Wellbutrin 300mg  per day (MAX) as her cirrhosis is well compensated. D/t difficulty sleep difficulty, we discussed and decided to try Wellbutrin slow release once daily. Pt verbalized understanding and agreement with plan. Prescription sent. Will follow. Pt follows closely with hepatology. Pt was unable to fill [employer] barrier. Pt wishes to continue current regimen. We will try a prescription for Wegovy to see if insurance will cover. Prescription sent. Will consider stopping naltrexone in the future.   START Wellbutrin XL 300 mg once daily CONTINUE semaglutide 0.25 mg weekly injections, Naltrexone 25 mg BID.   - Wellbutrin: pt was started on this for depression before enrolling at our office. ?weight loss on it.  - Naltrexone: started on 06/13/19 at 209 lb.  - Ozempic (semaglutide): started on 03/03/20 at 193 lbs.  Tried  - Orlistat: tried and failed.  Contraindicated  - Topamax: contraindicated dhx of kidney stone.  - Phentermine: contraindicated by hx of palpitations.  Obesity Surgery: Pt is interested in this option. However pt's BMI is now 33 so she does not meet criteria. Good response to medication and healthy lifestyle changes so we will revisit this option if pt gets on the plateau or gains weight above the BMI 35. (updated 12/07/2020)    Medical conditions:  Glaucoma: no  Seizures: no  Medullary thyroid cancer (personal or family hx): Father and PGM both had thyroid goiter. Multiple Endocrine Neoplasia: no  Palpitations/Tachycardia: YES  Chest Pain: no past history of MI.   Headaches/Migraines: YES  Nephrolithiasis: YES  H/o pancreatitis: YES, gallstone pancreatitis and she had cholecystectomy.  GERD: YES    Prior Surgeries:  Choleycystectomy: YES  Hysterectomy: YES    Obesity-associated comorbidities: Cirrhosis, GERD, suspected sleep apnea.  Current barriers include: Anxiety, depression, Sjogren's, lupus, emotional /stress eating, insomnia, fibromyalgia, pinched nerve  Birth Control Methods: N/A; s/p hysterectomy.

## 2020-12-08 NOTE — Unmapped (Unsigned)
[facility] LIVER CENTER  [facility]  [address] [place]  [place], [place]  [address]  Ph: [phone]  Fax: [phone]     HEPATOLOGY RETURN VISIT PROGRESS NOTE    REFERRING PROVIDER: Alexandria Lodge, MD  PRIMARY CARE PROVIDER: Alexandria Lodge, MD    DATE OF SERVICE: 12/10/2020    ASSESSMENT AND PLAN:   [name] is a 45 y.o. 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 She established care with [facility] in 01/2016 when she was referred for elevated liver enzymes and findings of possible cirrhosis on ultrasound. She underwent liver biopsy on 03/2016 showing cirrhosis secondary to steatohepatitis without evidence of AIH. Her cirrhosis is well compensated with no history of ascites, gastroesophageal varices or hepatic encephalopathy. She is not being considered for transplant due to low MELD.         1. NASH Cirrhosis:   -Repeat MELD labs today  -Continue weight loss    2. EV Screening: Last done 04/2019 with on varices  -Repeat EGD due in 04/2021, will schedule    3. HCC Screening: Last done 11/2019 with no lesions. Repeat U/S q6 months + AFP  -U/S pending today  -AFP sent today    4. Health Maintenance:  -Osteoporosis: Increased risk of osteopenia in patients with cirrhosis. Recommend checking Vitamin D level as well as DEXA scan if abnormal.   -Cervical Cancer: Last pap smear done: s/p hysterectomy  -Mammogram: Last done: Never done, defer to PCP  -Colon Cancer: Due at age 49    5. Vaccinations: We recommend that patients have vaccinations to prevent various infections that can occur, especially in the setting of having underlying liver disease. The following vaccinations should be given:  -Hepatitis A: Vaccinated  -Hepatitis B: Vaccinated  -Influenza (yearly): Per PCP  -Pneumococcal: PPSV23 02/2019. PCV13 due at age 89  -Zoster: Shingrix (age > 91). Two doses given 2-6 months apart.   -SARS-CoV-2: J&J, Pfizer booster    I spent a majority of my time today with the patient reviewing historical data, old records (including faxed data and data in Care Everywhere), performing a physical examination, and formulating an assessment and plan together with the patient.    Return to clinic in 6 months      Tonya Brady. Tonya Burger, MD  Assistant Professor of Medicine   [facility]  Division of Gastroenterology & Hepatology  p: [phone] - f: [phone]  E-mail: [email or web address]    Subjective   SUBJECTIVE:     CHIEF COMPLAINT: NASH Cirrhosis    HISTORY OF PRESENT ILLNESS:      Tonya Brady is a 45 y.o. 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 She established care with [facility]atology in 01/2016 when she was referred for elevated liver enzymes and findings of possible cirrhosis on ultrasound. She underwent liver biopsy on 03/2016 showing cirrhosis secondary to steatohepatitis without evidence of AIH. Her cirrhosis is well compensated with no history of ascites, gastroesophageal varices or hepatic encephalopathy.     INTERVAL HISTORY:(since the time of the patient's last visit):    The patient was last seen by me on 06/14/20 where she was losing some weight on Ozempic.     REVIEW OF SYSTEMS:   The balance of 12 systems reviewed is negative  except as noted in the HPI.     PAST MEDICAL HISTORY:  Past Medical History:   Diagnosis Date   ??? Bulging lumbar disc 2005   ??? Cirrhosis of liver (CMS-HCC)    ??? Depression    ??? Diabetes mellitus (CMS-HCC)    ??? Difficult intravenous access    ??? Fibromyalgia 03/09/2013   ??? NASH (nonalcoholic steatohepatitis)    ??? Secondary Sjogren's syndrome (CMS-HCC) 03/09/2013    Secondary to SLE  Diagnosed: 12/2003 (by sx's, parotid gland enlargement, and serologies) Pathology: biopsy not done Labs: + ANA, anti-SSA, anti-SSB, RF    ??? Systemic lupus erythematosus (CMS-HCC) 01/19/2011   ??? Valvular regurgitation        MEDICATIONS:   Current Outpatient Medications   Medication Sig Dispense Refill   ??? belimumab (BENLYSTA) 200 mg/mL AtIn Inject the contents of 1 injector (200mg ) under the skin every 7 days 4 mL 2   ??? buPROPion (WELLBUTRIN XL) 300 MG 24 hr tablet Take 1 tablet (300 mg total) by mouth every morning. 60 tablet 2   ??? empty container Misc Use as directed 1 each 2   ??? famotidine (PEPCID) 40 MG tablet TAKE 1 TABLET(40 MG) BY MOUTH EVERY NIGHT AS NEEDED FOR HEARTBURN 90 tablet 3   ??? fluticasone propionate (FLONASE) 50 mcg/actuation nasal spray 1 spray into each nostril daily. 16 g 11   ??? hydrOXYchloroQUINE (PLAQUENIL) 200 mg tablet Take 1 tablet (200 mg total) by mouth Two (2) times a day. 180 tablet 1   ??? ibuprofen (MOTRIN) 800 MG tablet ibuprofen 800 mg tablet     ??? inhalational spacing device (AEROCHAMBER MV) Spcr 1 each by Miscellaneous route every six (6) hours as needed. 1 each 0   ??? loratadine (CLARITIN) 10 mg tablet Take 10 mg by mouth daily. Takes as needed     ??? melatonin 1 mg Tab tablet Take by mouth nightly as needed.      ??? naltrexone (DEPADE) 50 mg tablet Take 0.5 tablets (25 mg total) by mouth two (2) times a day. 60 tablet 2   ??? WEGOVY 0.25 MG/0.5 ML SUBCUTANEOUS PEN INJECTOR Inject 0.25 mg under the skin every seven (7) days. 4 each 2     No current facility-administered medications for this visit.       ALLERGIES:   Aspirin, Fish containing products, Iodine, and Shellfish containing products       Objective   OBJECTIVE:   VITAL SIGNS: There were no vitals taken for this visit.  Wt Readings from Last 3 Encounters:   12/07/20 81.2 kg (179 lb)   11/01/20 (P) 76.7 kg (169 lb)   07/13/20 79 kg (174 lb 1.3 oz)       PHYSICAL EXAM:  Constitutional: Alert, Oriented x 3, No acute distress, well nourished and well hydrated.   HEENT: PERRL, conjunctiva clear, anicteric, oropharynx clear, neck supple, no LAD.   CV: Regular rate and rhythm, normal S1, S2. No murmurs. No JVD.  Lung: Clear to auscultation bilaterally. Unlabored breathing.   Abdomen: soft, normal bowel sounds, non-distended, non-tender. No organomegaly. No ascites.   Extremities: No edema, well perfused  MSK: No joint swelling or tenderness noted, no deformities  Skin: No rashes, jaundice or skin lesions noted  Neuro: No focal deficits. No asterixis.   Mental Status: Thought organized, appropriate affect, pleasantly interactive, not anxious appearing    DIAGNOSTIC STUDIES:  I have reviewed all pertinent diagnostic studies, including:  Laboratory results:  Lab Results  Component Value Date    WBC 3.1 (L) 11/01/2020    HGB 14.3 11/01/2020    MCV 90.1 11/01/2020    PLT 161 11/01/2020    Lab Results   Component Value Date    NA 139 11/30/2020    K 4.3 11/30/2020    CL 109 (H) 11/30/2020    CREATININE 0.78 11/30/2020    GLU 109 11/30/2020    CALCIUM 9.0 11/30/2020      Lab Results   Component Value Date    ALBUMIN 3.8 06/14/2020    AST 18 06/14/2020    ALT 15 06/14/2020    ALKPHOS 50 06/14/2020    BILITOT 0.4 06/14/2020    Lab Results   Component Value Date    INR 1.22 06/14/2020        Lab Results   Component Value Date    AFPTM 2 06/14/2020    AFPTM 2 03/07/2019       Lab Results   Component Value Date    IRON 67 01/31/2016    FERRITIN 251.0 (H) 01/31/2016     Lab Results   Component Value Date    CHOL 160 03/03/2020    TRIG 75 03/03/2020    A1C 5.4 03/03/2020     Lab Results   Component Value Date    VITAMINB12 >1000 (H) 06/23/2011     Lab Results   Component Value Date    HBSAG Nonreactive 11/17/2015    HEPCAB Nonreactive 11/17/2015     Lab Results   Component Value Date    ANA Positive (A) 01/31/2016    SMOOTHMUSCAB Negative 01/31/2016    MITOAB Negative 01/31/2016       MELD-Na score: 8 at 06/14/2020 10:06 AM  Calculated from:  Serum Creatinine: 0.77 mg/dL (Using min of 1 mg/dL) at 1/61/0960 45:40 AM  Serum Sodium: 142 mmol/L (Using max of 137 mmol/L) at 06/14/2020 10:06 AM  Total Bilirubin: 0.4 mg/dL (Using min of 1 mg/dL) at 9/81/1914 78:29 AM  INR(ratio): 1.22 at 06/14/2020 10:06 AM  Age: 19 years    Radiographic studies:  US Abdomen complete 06/14/20:  -Nodular heterogeneous liver, compatible with history of chronic liver disease. No focal liver lesions identified.  LI-RADS ultrasound category: [place] 1- negative.  Visualization score: B (moderate limitations)       GI Procedures:  EGD 05/10/2016:       The examined esophagus was normal.       There is no endoscopic evidence of varices in the entire esophagus.       The entire examined stomach was normal.       There is no endoscopic evidence of varices in the stomach.       The duodenal bulb and second portion of the duodenum were normal.    Liver Biopsy 03/24/2016:  A: Liver, core biopsy  - Liver with bridging fibrosis/early cirrhosis (stage 3-4), mild steatosis, and changes consistent with steatohepatitis

## 2020-12-10 ENCOUNTER — Ambulatory Visit: Admit: 2020-12-10 | Discharge: 2020-12-10 | Payer: PRIVATE HEALTH INSURANCE

## 2020-12-10 DIAGNOSIS — K7581 Nonalcoholic steatohepatitis (NASH): Principal | ICD-10-CM

## 2020-12-10 DIAGNOSIS — K7460 Unspecified cirrhosis of liver: Principal | ICD-10-CM

## 2020-12-14 NOTE — Unmapped (Signed)
[facility] Refill Coordination Note    Specialty Medication(s) to be Shipped:   Inflammatory Disorders: Benlysta    Other medication(s) to be shipped: No additional medications requested for fill at this time     Tonya Brady, DOB: 27-Sep-1975  Phone: [phone] (home)       All above HIPAA information was verified with patient.     Was a Nurse, learning disability used for this call? No    Completed refill call assessment today to schedule patient's medication shipment from the [facility] [phone]).  All relevant notes have been reviewed.     Specialty medication(s) and dose(s) confirmed: Regimen is correct and unchanged.   Changes to medications: Tonya Brady reports no changes at this time.  Changes to insurance: No  New side effects reported not previously addressed with a pharmacist or physician: None reported  Questions for the pharmacist: No    Confirmed patient received a Conservation officer, historic buildings and a Surveyor, mining with first shipment. The patient will receive a drug information handout for each medication shipped and additional FDA Medication Guides as required.       DISEASE/MEDICATION-SPECIFIC INFORMATION        For patients on injectable medications: Patient currently has 1 doses left.  Next injection is scheduled for 7/28.    SPECIALTY MEDICATION ADHERENCE     Medication Adherence    Patient reported X missed doses in the last month: 0  Specialty Medication: Benlysta  Patient is on additional specialty medications: No  Patient is on more than two specialty medications: No  Any gaps in refill history greater than 2 weeks in the last 3 months: no  Demonstrates understanding of importance of adherence: yes  Informant: patient              Were doses missed due to medication being on hold? No    Benlysta 200mg /ml: Patient has 7 days of medication on hand     REFERRAL TO PHARMACIST     Referral to the pharmacist: Not needed      [facility]     Shipping address confirmed in Epic. Delivery Scheduled: Yes, Expected medication delivery date: 8/2.     Medication will be delivered via Same Day Courier to the prescription address in Epic WAM.    Tonya Brady   [facility] Specialty Technician

## 2020-12-21 MED FILL — BENLYSTA 200 MG/ML SUBCUTANEOUS AUTO-INJECTOR: SUBCUTANEOUS | 28 days supply | Qty: 4 | Fill #1

## 2021-01-03 NOTE — Unmapped (Signed)
Forwarding to Dr. Loa Socks.

## 2021-01-17 NOTE — Unmapped (Signed)
[facility] Refill Coordination Note    Specialty Medication(s) to be Shipped:   Inflammatory Disorders: Benlysta    Other medication(s) to be shipped: No additional medications requested for fill at this time     Tonya Brady, DOB: 06/10/1975  Phone: [phone] (home)       All above HIPAA information was verified with patient.     Was a Nurse, learning disability used for this call? No    Completed refill call assessment today to schedule patient's medication shipment from the [facility] [phone]).  All relevant notes have been reviewed.     Specialty medication(s) and dose(s) confirmed: Regimen is correct and unchanged.   Changes to medications: Tonya Brady reports no changes at this time.  Changes to insurance: No  New side effects reported not previously addressed with a pharmacist or physician: None reported  Questions for the pharmacist: No    Confirmed patient received a Conservation officer, historic buildings and a Surveyor, mining with first shipment. The patient will receive a drug information handout for each medication shipped and additional FDA Medication Guides as required.       DISEASE/MEDICATION-SPECIFIC INFORMATION        For patients on injectable medications: Patient currently has 0 doses left.  Next injection is scheduled for 01/20/2021.    SPECIALTY MEDICATION ADHERENCE     Medication Adherence    Patient reported X missed doses in the last month: 0  Specialty Medication: Benlysta  Patient is on additional specialty medications: No        Were doses missed due to medication being on hold? No    REFERRAL TO PHARMACIST     Referral to the pharmacist: Not needed      [facility]     Shipping address confirmed in Epic.     Delivery Scheduled: Yes, Expected medication delivery date: 01/18/2021.     Medication will be delivered via Same Day Courier to the prescription address in Epic WAM.    Oretha Milch   [facility] Specialty Technician

## 2021-01-18 MED FILL — BENLYSTA 200 MG/ML SUBCUTANEOUS AUTO-INJECTOR: SUBCUTANEOUS | 28 days supply | Qty: 4 | Fill #2

## 2021-01-19 DIAGNOSIS — Z1231 Encounter for screening mammogram for malignant neoplasm of breast: Principal | ICD-10-CM

## 2021-02-08 DIAGNOSIS — M329 Systemic lupus erythematosus, unspecified: Principal | ICD-10-CM

## 2021-02-08 MED ORDER — BENLYSTA 200 MG/ML SUBCUTANEOUS AUTO-INJECTOR
SUBCUTANEOUS | 2 refills | 28 days
Start: 2021-02-08 — End: ?

## 2021-02-08 NOTE — Unmapped (Signed)
Benlysta refill  Last Visit Date: 11/01/2020  Next Visit Date: 07/18/2021    Lab Results   Component Value Date    ALT 21 12/10/2020    AST 22 12/10/2020    ALBUMIN 3.8 12/10/2020    CREATININE 0.72 12/10/2020     Lab Results   Component Value Date    WBC 2.7 (L) 12/10/2020    HGB 14.1 12/10/2020    HCT 42.1 12/10/2020    PLT 161 12/10/2020     Lab Results   Component Value Date    NEUTROPCT 65.9 11/01/2020    LYMPHOPCT 20.8 11/01/2020    MONOPCT 12.0 11/01/2020    EOSPCT 0.9 11/01/2020    BASOPCT 0.4 11/01/2020

## 2021-02-08 NOTE — Unmapped (Signed)
[facility] Clinical Assessment & Refill Coordination Note    Tonya Brady, DOB: [birth year]  Phone: [phone] (home)     All above HIPAA information was verified with patient.     Was a Nurse, learning disability used for this call? No    Specialty Medication(s):   Inflammatory Disorders: Benlysta     Current Outpatient Medications   Medication Sig Dispense Refill   ??? belimumab (BENLYSTA) 200 mg/mL AtIn Inject the contents of 1 injector (200mg ) under the skin every 7 days 4 mL 2   ??? buPROPion (WELLBUTRIN XL) 300 MG 24 hr tablet Take 1 tablet (300 mg total) by mouth every morning. 60 tablet 2   ??? empty container Misc Use as directed 1 each 2   ??? famotidine (PEPCID) 40 MG tablet TAKE 1 TABLET(40 MG) BY MOUTH EVERY NIGHT AS NEEDED FOR HEARTBURN 90 tablet 3   ??? fluticasone propionate (FLONASE) 50 mcg/actuation nasal spray 1 spray into each nostril daily. 16 g 11   ??? hydrOXYchloroQUINE (PLAQUENIL) 200 mg tablet Take 1 tablet (200 mg total) by mouth Two (2) times a day. 180 tablet 1   ??? ibuprofen (MOTRIN) 800 MG tablet ibuprofen 800 mg tablet     ??? inhalational spacing device (AEROCHAMBER MV) Spcr 1 each by Miscellaneous route every six (6) hours as needed. (Patient not taking: Reported on 12/10/2020) 1 each 0   ??? lidocaine-menthol 4-1 % PtMd Apply topically.     ??? loratadine (CLARITIN) 10 mg tablet Take 10 mg by mouth daily. Takes as needed     ??? melatonin 1 mg Tab tablet Take by mouth nightly as needed.      ??? naltrexone (DEPADE) 50 mg tablet Take 0.5 tablets (25 mg total) by mouth two (2) times a day. 60 tablet 2   ??? WEGOVY 0.25 MG/0.5 ML SUBCUTANEOUS PEN INJECTOR Inject 0.25 mg under the skin every seven (7) days. (Patient not taking: Reported on 12/10/2020) 4 each 2     No current facility-administered medications for this visit.        Changes to medications: Tonya Brady reports no changes at this time.    Allergies   Allergen Reactions   ??? Aspirin Swelling     Other reaction(s): SWELLING/EDEMA   ??? Fish Containing Products Diarrhea and Nausea And Vomiting   ??? Iodine Other (See Comments)     Pt told not to have d/t shellfish allergy   ??? Shellfish Containing Products Hives, Diarrhea and Nausea Only     Other reaction(s): HIVES  Other reaction(s): HIVES  Other reaction(s): NAUSEA       Changes to allergies: No    SPECIALTY MEDICATION ADHERENCE       Medication Adherence    Patient reported X missed doses in the last month: 0  Specialty Medication: Benlysta 200mg /ml  Patient is on additional specialty medications: No  Informant: patient          Specialty medication(s) dose(s) confirmed: Regimen is correct and unchanged.     Are there any concerns with adherence? No    Adherence counseling provided? Not needed    CLINICAL MANAGEMENT AND INTERVENTION      Clinical Benefit Assessment:    Do you feel the medicine is effective or helping your condition? Yes    Clinical Benefit counseling provided? Progress note from 6/13 shows evidence of clinical benefit    Adverse Effects Assessment:    Are you experiencing any side effects? No    Are  you experiencing difficulty administering your medicine? No    Quality of Life Assessment:    Quality of Life    Rheumatology  Oncology  Dermatology  Cystic Fibrosis          How many days over the past month did your SLE  keep you from your normal activities? For example, brushing your teeth or getting up in the morning. 0    Have you discussed this with your provider? Not needed    Acute Infection Status:    Acute infections noted within Epic:  No active infections  Patient reported infection: None    Therapy Appropriateness:    Is therapy appropriate? Yes, therapy is appropriate and should be continued    DISEASE/MEDICATION-SPECIFIC INFORMATION      For patients on injectable medications: Patient currently has 1 doses left.  Next injection is scheduled for 9/22.    PATIENT SPECIFIC NEEDS     - Does the patient have any physical, cognitive, or cultural barriers? No    - Is the patient high risk? No    - Does the patient require a Care Management Plan? No     - Does the patient require physician intervention or other additional services (i.e. nutrition, smoking cessation, social work)? No      SHIPPING     Specialty Medication(s) to be Shipped:   Inflammatory Disorders: Benlysta    Other medication(s) to be shipped: No additional medications requested for fill at this time     Changes to insurance: No    Delivery Scheduled: Yes, Expected medication delivery date: 9/26.  However, Rx request for refills was sent to the provider as there are none remaining.     Medication will be delivered via Same Day Courier to the confirmed prescription address in [facility]    The patient will receive a drug information handout for each medication shipped and additional FDA Medication Guides as required.  Verified that patient has previously received a Conservation officer, historic buildings and a Surveyor, mining.    The patient or caregiver noted above participated in the development of this care plan and knows that they can request review of or adjustments to the care plan at any time.      All of the patient's questions and concerns have been addressed.    Julianne Rice   [facility] Specialty Pharmacist

## 2021-02-09 MED ORDER — BENLYSTA 200 MG/ML SUBCUTANEOUS AUTO-INJECTOR
SUBCUTANEOUS | 2 refills | 28 days | Status: CP
Start: 2021-02-09 — End: ?
  Filled 2021-02-14: qty 4, 28d supply, fill #0

## 2021-02-17 ENCOUNTER — Ambulatory Visit
Admit: 2021-02-17 | Discharge: 2021-02-18 | Payer: PRIVATE HEALTH INSURANCE | Attending: Family Medicine | Primary: Family Medicine

## 2021-02-17 ENCOUNTER — Ambulatory Visit: Admit: 2021-02-17 | Discharge: 2021-02-17 | Payer: PRIVATE HEALTH INSURANCE

## 2021-02-17 DIAGNOSIS — K7460 Unspecified cirrhosis of liver: Principal | ICD-10-CM

## 2021-02-17 DIAGNOSIS — K7581 Nonalcoholic steatohepatitis (NASH): Principal | ICD-10-CM

## 2021-02-17 DIAGNOSIS — E669 Obesity, unspecified: Principal | ICD-10-CM

## 2021-02-17 MED ORDER — TIRZEPATIDE 2.5 MG/0.5 ML SUBCUTANEOUS PEN INJECTOR
SUBCUTANEOUS | 0 refills | 30.00000 days | Status: CP
Start: 2021-02-17 — End: 2021-03-19

## 2021-02-17 NOTE — Unmapped (Signed)
Patient in clinic for office visit, Flucelvax IIV4 Pres-Free vaccine administered per protocol,NCIR reviewed and vaccine accuracy verified with teammates: Valene Bors, CMA, patient eligible to receive vacstock: Private Vaccine, vaccine administered Left Deltoid, pt tolerated well, no s/s of a reaction noted, VIS given to patient

## 2021-02-17 NOTE — Unmapped (Addendum)
Tonya Brady is a 45 y.o. WF with Class 2 obesity due to long term steroid use (dx of SLE since age 71). Her medical history includes SLE, cirrhosis, GERD, suspected OSA, depression, and emotional / stress eating. Pt wishes weight loss to prevent further liver damage.    Weight Summary:  1. Starting weight/BMI/WC: 209 lb, BMI 39.6 (06/13/19, self report), WC 39 (11/04/19, 1st measure)  2. Target weight/goal: BMI 30 or 159 lb.  3. Today's weight/BMI: 82.1 kg (181 lb), BMI (!) 35.35 (02/17/2021)   4. % Body weight loss: 13.4%  5. Today's Waist Circumference: 37 inches (02/17/2021) <= 38.75 (01/07/20)  6. Today's visit number: 8th  7. Duration in program: 20 mo    Referred by: [name], MD in [facility].    GOALS: 1) Continue regular exercise and reduced sugar intake     I have reviewed the patient's medical history, lifestyle history and labs/tests.   My recommendations include the following:    Eating Pattern: No more soda. One meal per day. Snacks on fruits. Pt is working on finding carbonated waters that she likes. Endorses healthy home cooked meals.  -- Will meet regularly with our nutritionist during the duration of medical management treatment. Discussed about healthier food choices and advised to home cook for less processed meals.    Physical Activity: Continues to walk outside on track. Gym 2-3x per week with weights and cardio (2-2.5 hr each session).  -- Goal of 30 minutes of aerobic exercise 5x a week or a total of 150 minutes weekly, and 30 minutes of resistance exercise 2x a week or a total of 60 min weekly.     Sleep: Currently sleeping 6 hours without sleep aids. +OSA screening (STOP-BANG 3/8). Pt declined to do sleep study for now and wishes to focus on lifestyle changes.  -- Recommend 7-8 hours of sleep at night. Advised to try melatonin 10 mg for insomnia.     Stress management: Current stressors: domestic, health, world news, finance. She has an established therapist.  -- Recommend to try relaxation techniques such as breathing techniques, listening to music to help sleep at night and yoga stretching throughout the day to help control stress.     Weight gain causing medication: None.   Hx of long term steroid use (prednisone 80 mg daily for lupus).  Hx of Ambien for insomnia years ago.    Medication: Pt was not able to get [facility] d/t supply shortages. Previously responded well to Ozempic, but insurance stopped covering. Taking naltrexone, and Wellbutrin XR as directed. Reports sleep difficulty has completely resolved.  For further weight loss support, we discussed and decided to trial Mounjaro (tirzepatide). Instruction given; side effect profile discussed. Prescription sent. Continue other medications as directed. Pt verbalized understanding and agreed to plan. Will follow.    START Mounjaro (tirzepatide) 2.5 mg weekly injection for 4 weeks, then INCREASE Mounjaro (tirzepatide) 2.5 mg --> 5.0 mg weekly injection CONTINUE Wellbutrin XL 300 mg once daily, Naltrexone 25 mg BID.    - Mounjaro (tirzepatide): started 02/17/21 at 181 lbs  - Wellbutrin: pt was started on this for depression before enrolling at our office. ?weight loss on it.  - Naltrexone: started on 06/13/19 at 209 lb.  - Ozempic (semaglutide): started on 03/03/20 at 193 lbs.  Tried  - Orlistat: tried and failed.  Contraindicated  - Topamax: contraindicated dhx of kidney stone.  - Phentermine: contraindicated by hx of palpitations.    Obesity Surgery: Pt is  interested in this option. However pt's BMI is now 33 so she does not meet criteria. Good response to medication and healthy lifestyle changes so we will revisit this option if pt gets on the plateau or gains weight above the BMI 35. (updated 02/17/2021)    Medical conditions:  Glaucoma: no  Seizures: no  Medullary thyroid cancer (personal or family hx): Father and PGM both had thyroid goiter. Multiple Endocrine Neoplasia: no  Palpitations/Tachycardia: YES  Chest Pain: no past history of MI.   Headaches/Migraines: YES  Nephrolithiasis: YES  H/o pancreatitis: YES, gallstone pancreatitis and she had cholecystectomy.  GERD: YES    Prior Surgeries:  Choleycystectomy: YES  Hysterectomy: YES    Obesity-associated comorbidities: Cirrhosis, GERD, suspected sleep apnea.  Current barriers include: Anxiety, depression, Sjogren's, lupus, emotional /stress eating, insomnia, fibromyalgia, pinched nerve  Birth Control Methods: N/A; s/p hysterectomy.

## 2021-02-17 NOTE — Unmapped (Addendum)
Following with [facility] (Dr. Sherryll Burger) and getting [place] q53mo for tumor surveillance as well as LFTs. We will continue to work on aggressive weight loss with lifestyle modifications and pharmacotherapy--See Class 2 obesity. Continue to follow Specialist closely.

## 2021-02-17 NOTE — Unmapped (Signed)
[facility] Follow Up    Assessment/Plan:     Chief Complaint   Patient presents with   ??? Weight Loss     No main concern        Problem List Items Addressed This Visit        Digestive    Liver cirrhosis secondary to NASH (nonalcoholic steatohepatitis) (CMS-HCC) - Primary     Following with [facility] (Dr. Sherryll Burger) and getting [place] q56mo for tumor surveillance as well as LFTs. We will continue to work on aggressive weight loss with lifestyle modifications and pharmacotherapy--See Class 1 obesity. Continue to follow Specialist closely.             Other    Class 1 obesity     [name] is a 45 y.o. 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 Her medical history includes SLE, cirrhosis, GERD, suspected OSA, depression, and emotional / stress eating. Pt wishes weight loss to prevent further liver damage.    Weight Summary:  1. Starting weight/BMI/WC: 209 lb, BMI 39.6 (06/13/19, self report), WC 39 (11/04/19, 1st measure)  2. Target weight/goal: BMI 30 or 159 lb.  3. Today's weight/BMI: 82.1 kg (181 lb), BMI (!) 35.35 (02/17/2021)   4. % Body weight loss: 13.4%  5. Today's Waist Circumference: 37 inches (02/17/2021) <= 38.75 (01/07/20)  6. Today's visit number: 8th  7. Duration in program: 20 mo    Referred by: [name], MD in [facility].    GOALS: 1) Continue regular exercise and reduced sugar intake     I have reviewed the patient's medical history, lifestyle history and labs/tests.   My recommendations include the following:    Eating Pattern: No more soda. One meal per day. Snacks on fruits. Pt is working on finding carbonated waters that she likes. Endorses healthy home cooked meals.  -- Will meet regularly with our nutritionist during the duration of medical management treatment. Discussed about healthier food choices and advised to home cook for less processed meals.    Physical Activity: Continues to walk outside on track. Gym 2-3x per week with weights and cardio (2-2.5 hr each session).  -- Goal of 30 minutes of aerobic exercise 5x a week or a total of 150 minutes weekly, and 30 minutes of resistance exercise 2x a week or a total of 60 min weekly.     Sleep: Currently sleeping 6 hours without sleep aids. +OSA screening (STOP-BANG 3/8). Pt declined to do sleep study for now and wishes to focus on lifestyle changes.  -- Recommend 7-8 hours of sleep at night. Advised to try melatonin 10 mg for insomnia.     Stress management: Current stressors: domestic, health, world news, finance. She has an established therapist.  -- Recommend to try relaxation techniques such as breathing techniques, listening to music to help sleep at night and yoga stretching throughout the day to help control stress.     Weight gain causing medication: None.   Hx of long term steroid use (prednisone 80 mg daily for lupus).  Hx of Ambien for insomnia years ago.    Medication: Pt was not able to get [facility] d/t supply shortages. Previously responded well to Ozempic, but insurance stopped covering. Taking naltrexone, and Wellbutrin XR as directed. Reports sleep difficulty has completely resolved.  For further weight loss support, we discussed and decided to trial Mounjaro (tirzepatide). Instruction given; side effect profile discussed.  Prescription sent. Continue other medications as directed. Pt verbalized understanding and agreed to plan. Will follow.    START Mounjaro (tirzepatide) 2.5 mg weekly injection for 4 weeks, then INCREASE Mounjaro (tirzepatide) 2.5 mg --> 5.0 mg weekly injection CONTINUE Wellbutrin XL 300 mg once daily, Naltrexone 25 mg BID.    - Mounjaro (tirzepatide): started 02/17/21 at 181 lbs  - Wellbutrin: pt was started on this for depression before enrolling at our office. ?weight loss on it.  - Naltrexone: started on 06/13/19 at 209 lb.  - Ozempic (semaglutide): started on 03/03/20 at 193 lbs.  Tried  - Orlistat: tried and failed.  Contraindicated  - Topamax: contraindicated dhx of kidney stone.  - Phentermine: contraindicated by hx of palpitations.    Obesity Surgery: Pt is interested in this option. However pt's BMI is now 33 so she does not meet criteria. Good response to medication and healthy lifestyle changes so we will revisit this option if pt gets on the plateau or gains weight above the BMI 35. (updated 02/17/2021)    Medical conditions:  Glaucoma: no  Seizures: no  Medullary thyroid cancer (personal or family hx): Father and PGM both had thyroid goiter. Multiple Endocrine Neoplasia: no  Palpitations/Tachycardia: YES  Chest Pain: no past history of MI.   Headaches/Migraines: YES  Nephrolithiasis: YES  H/o pancreatitis: YES, gallstone pancreatitis and she had cholecystectomy.  GERD: YES    Prior Surgeries:  Choleycystectomy: YES  Hysterectomy: YES    Obesity-associated comorbidities: Cirrhosis, GERD, suspected sleep apnea.  Current barriers include: Anxiety, depression, Sjogren's, lupus, emotional /stress eating, insomnia, fibromyalgia, pinched nerve  Birth Control Methods: N/A; s/p hysterectomy.                Return in about 3 months (around 05/19/2021) for Weight clinic F/U one slot. moujanro, nutrition.    I have reviewed and addressed the patient???s adherence and response to prescribed medications. I have identified patient barriers to following the proposed medication and treatment plan, and have noted opportunities to optimize healthy behaviors. I have answered the patient???s questions to satisfaction and the patient voices understanding.    Time: Greater than 50% of this encounter was spent in direct consultation with the patient in evaluation and discussing all of the above. Duration of encounter: 30 minutes.     HPI:     Tonya Brady is a 45 y.o. year old female who has a  has a past medical history of Bulging lumbar disc (2005), Cirrhosis of liver (CMS-HCC), Depression, Diabetes mellitus (CMS-HCC), Difficult intravenous access, Fibromyalgia (03/09/2013), NASH (nonalcoholic steatohepatitis), Secondary Sjogren's syndrome (CMS-HCC) (03/09/2013), Systemic lupus erythematosus (CMS-HCC) (01/19/2011), and Valvular regurgitation. who presents today for [facility] follow up.    Weight Management History    Wt Readings from Last 6 Encounters:   02/17/21 82.1 kg (181 lb)   12/10/20 81 kg (178 lb 8 oz)   12/07/20 81.2 kg (179 lb)   11/01/20 (P) 76.7 kg (169 lb)   07/13/20 79 kg (174 lb 1.3 oz)   06/14/20 81 kg (178 lb 9.6 oz)     Waist Circumference 11/04/2019 01/07/2020 07/13/2020 12/07/2020 02/17/2021   Waist Circumference 39 38.75 35.75 36 37          Update: Weight stable from last visit. Son plays high school football!  Medication: Pt was never able to get [facility] d/t supply shortage.   Eating Pattern: Has been trying to make healthy choices. Says she started doing some wheat bread and  wheat noodles again which isn't helping with her weight loss goals. Also experiences that sugar alternatives in drinks makes her more hungry. Pt has been really listening to her body when making food choices and feels she could make some improvements.  Physical Activity: Has been walking every morning on the track after she drops her son off. Has also been going to the gym twice a week with her son who is a Land. Says she can feel a difference in her arms.   Barrier: Pt has had to start working a second FT job which she reports helps her stress    Lab Results   Component Value Date    LDL Calculated 101 (H) 03/03/2020    LDL Cholesterol, Calculated 104 06/23/2011    HDL 44 03/03/2020    HDL 35 (L) 06/23/2011    Hemoglobin A1C 5.4 03/03/2020    Hemoglobin A1C 5.9 02/10/2016    Hemoglobin A1C 5.5 06/23/2011    Glucose 101 12/10/2020    TSH 2.500 11/10/2015    TSH 1.92 06/23/2011    AST 22 12/10/2020    AST 19 07/26/2010    ALT 21 12/10/2020    ALT 33 07/26/2010    Alkaline Phosphatase 51 12/10/2020     Past Medical/Surgical History:     Past Medical History:   Diagnosis Date   ??? Bulging lumbar disc 2005   ??? Cirrhosis of liver (CMS-HCC)    ??? Depression    ??? Diabetes mellitus (CMS-HCC)    ??? Difficult intravenous access    ??? Fibromyalgia 03/09/2013   ??? NASH (nonalcoholic steatohepatitis)    ??? Secondary Sjogren's syndrome (CMS-HCC) 03/09/2013    Secondary to SLE  Diagnosed: 12/2003 (by sx's, parotid gland enlargement, and serologies) Pathology: biopsy not done Labs: + ANA, anti-SSA, anti-SSB, RF    ??? Systemic lupus erythematosus (CMS-HCC) 01/19/2011   ??? Valvular regurgitation      Past Surgical History:   Procedure Laterality Date   ??? CARPAL TUNNEL RELEASE     ??? CESAREAN SECTION     ??? CHOLECYSTECTOMY     ??? HERNIA REPAIR     ??? HYSTERECTOMY     ??? PR REVISE MEDIAN N/CARPAL TUNNEL SURG Left 12/30/2018    Procedure: R16 NEUROPLASTY AND/OR TRANSPOSITION; MEDIAN NERVE AT CARPAL TUNNEL;  Surgeon: Theodora Blow Jacqlyn Krauss, MD;  Location: [facility] OR [facility];  Service: Orthopedics   ??? PR SALIVARY SURG UNLISTED PROC Right 12/06/2018    Procedure: R22 UNLISTED PROC SALIVARY GLANDS/DUCTS;  Surgeon: Michelle Piper, MD;  Location: [facility] OR [facility];  Service: ENT   ??? PR SALIVARY SURG UNLISTED PROC Left 02/18/2019    Procedure: R25 UNLISTED PROC SALIVARY GLANDS/DUCTS;  Surgeon: Michelle Piper, MD;  Location: [facility] OR [facility];  Service: ENT   ??? PR UPPER GI ENDOSCOPY,DIAGNOSIS N/A 05/10/2016    Procedure: UGI ENDO, INCLUDE ESOPHAGUS, STOMACH, & DUODENUM &/OR JEJUNUM; DX W/WO COLLECTION SPECIMN, BY BRUSH OR WASH;  Surgeon: Carmon Ginsberg, MD;  Location: GI [facility];  Service: Gastroenterology   ??? PR UPPER GI ENDOSCOPY,DIAGNOSIS N/A 05/02/2019    Procedure: UGI ENDO, INCLUDE ESOPHAGUS, STOMACH, & DUODENUM &/OR JEJUNUM; DX W/WO COLLECTION SPECIMN, BY BRUSH OR WASH;  Surgeon: Chriss Driver, MD;  Location: GI PROCEDURES [facility];  Service: Gastroenterology   ??? TOTAL ABDOMINAL HYSTERECTOMY     ??? TUBAL LIGATION         Social History:     Social History  Socioeconomic History   ??? Marital status: Married     Spouse name: None   ??? Number of children: None   ??? Years of education: None   ??? Highest education level: None   Tobacco Use   ??? Smoking status: Never Smoker   ??? Smokeless tobacco: Never Used   Vaping Use   ??? Vaping Use: Never used   Substance and Sexual Activity   ??? Alcohol use: No     Alcohol/week: 0.0 standard drinks   ??? Drug use: No   ??? Sexual activity: Not Currently     Partners: Male   Other Topics Concern   ??? Do you use sunscreen? Yes   ??? Tanning bed use? No   ??? Are you easily burned? No   ??? Excessive sun exposure? No   ??? Blistering sunburns? No       Family History:     Family History   Problem Relation Age of Onset   ??? Cancer Mother    ??? Hypertension Mother    ??? Heart disease Father    ??? Heart attack Father    ??? Diabetes Maternal Grandmother    ??? Hypertension Maternal Grandmother    ??? Thyroid disease Maternal Grandmother    ??? Breast cancer Maternal Grandmother    ??? Breast cancer Paternal Grandmother    ??? Diabetes Paternal Grandmother    ??? Thyroid disease Paternal Grandmother    ??? Depression Son    ??? Mental illness Son    ??? Breast cancer Maternal Aunt    ??? Aneurysm Maternal Aunt    ??? Diabetes Maternal Aunt    ??? Diabetes Maternal Uncle    ??? Heart attack Maternal Uncle    ??? Melanoma Neg Hx    ??? Basal cell carcinoma Neg Hx    ??? Squamous cell carcinoma Neg Hx    ??? Drug abuse Neg Hx    ??? Stroke Neg Hx        Allergies:     Aspirin, Fish containing products, Iodine, and Shellfish containing products    Current Medications:     Current Outpatient Medications   Medication Sig Dispense Refill   ??? belimumab (BENLYSTA) 200 mg/mL AtIn Inject the contents of 1 injector (200mg ) under the skin every 7 days 4 mL 2   ??? buPROPion (WELLBUTRIN XL) 300 MG 24 hr tablet Take 1 tablet (300 mg total) by mouth every morning. 60 tablet 2   ??? empty container Misc Use as directed 1 each 2   ??? famotidine (PEPCID) 40 MG tablet TAKE 1 TABLET(40 MG) BY MOUTH EVERY NIGHT AS NEEDED FOR HEARTBURN 90 tablet 3   ??? fluticasone propionate (FLONASE) 50 mcg/actuation nasal spray 1 spray into each nostril daily. 16 g 11   ??? hydrOXYchloroQUINE (PLAQUENIL) 200 mg tablet Take 1 tablet (200 mg total) by mouth Two (2) times a day. 180 tablet 1   ??? ibuprofen (MOTRIN) 800 MG tablet ibuprofen 800 mg tablet     ??? lidocaine-menthol 4-1 % PtMd Apply topically.     ??? melatonin 1 mg Tab tablet Take by mouth nightly as needed.      ??? naltrexone (DEPADE) 50 mg tablet Take 0.5 tablets (25 mg total) by mouth two (2) times a day. 60 tablet 2   ??? tirzepatide 2.5 mg/0.5 mL PnIj Inject 2.5 mg under the skin once a week. 2 mL 0     No current facility-administered medications for this visit.  I have reviewed and (if needed) updated the patient's problem list, medications, allergies, past medical and surgical history, social and family history.    ROS:     Unless otherwise stated in the HPI:  CONSTITUTIONAL: no fever chills.   HEENT: Eyes: No diplopia or blurred vision. ENT: No earache, sore throat or runny nose.   CARDIOVASCULAR: No pressure, squeezing, strangling, tightness, heaviness or aching about the chest, neck, axilla or epigastrium.   RESPIRATORY: No cough, shortness of breath, PND or orthopnea.   GASTROINTESTINAL: No nausea, vomiting or diarrhea.   GENITOURINARY: No dysuria, frequency or urgency.   MUSCULOSKELETAL: no new pains, no joint swelling or redness.   SKIN: No change in skin, hair or nails.   NEUROLOGIC: No paresthesias, fasciculations, seizures or weakness.   PSYCHIATRIC: No disorder of thought or mood.   ENDOCRINE: No heat or cold intolerance, polyuria or polydipsia.   HEMATOLOGICAL: No easy bruising or bleeding.    Vital Signs:     Body mass index is 35.35 kg/m??.  Waist Circumference: 37 inches     Wt Readings from Last 3 Encounters:   02/17/21 82.1 kg (181 lb)   12/10/20 81 kg (178 lb 8 oz)   12/07/20 81.2 kg (179 lb)     Temp Readings from Last 3 Encounters:   02/17/21 36.4 ??C (97.5 ??F) (Temporal)   12/10/20 36.6 ??C (97.9 ??F) (Tympanic)   12/07/20 35.6 ??C (96 ??F) (Temporal)     BP Readings from Last 3 Encounters:   02/17/21 107/71   12/10/20 114/70   12/07/20 101/66     Pulse Readings from Last 3 Encounters:   02/17/21 65   12/10/20 57   12/07/20 65     Waist Circumference 11/04/2019 01/07/2020 07/13/2020 12/07/2020 02/17/2021   Waist Circumference 39 38.75 35.75 36 37     Physical Exam:     General: well appearing, in NAD, Body mass index is 35.35 kg/m??. Ambulatory without help.  Body fat distribution: central & gluteofemoral adiposity. No supraclavicular adiposity. No dorsal adiposity. Waist Circumference: 37 inches     Head: normocephalic atraumatic.  Eyes: PERRLA, EOMI, Sclera WNL.   Oral pharynx: moist, no exudate, no erythema, not enlarged. Good dental hygiene. Moderate OP crowding.  Neck: supple, no LAD, no thyromegaly, no bruit.  CV: RRR no rubs or murmurs. Peripheral edema: none.  Lungs: clear bilaterally to ascultation. No wheezing.  Abdomen: soft, NT/ND. No intertrigo. Moderate pannus. No hepatomegaly.   Extremities: no clubbing, cyanosis, No edema.  Skin: no concerning lesions, rashes observed. No lipomas, no acanthosis nigricans.  Neuro: Alert and oriented X 3.    Labs:     No visits with results within 1 Month(s) from this visit.   Latest known visit with results is:   Hospital Outpatient Visit on 12/10/2020   Component Date Value Ref Range Status   ??? AFP-Tumor Marker 12/10/2020 3  <=8 ng/mL Final   ??? Albumin 12/10/2020 3.8  3.4 - 5.0 g/dL Final   ??? Total Protein 12/10/2020 7.2  5.7 - 8.2 g/dL Final   ??? Total Bilirubin 12/10/2020 0.3  0.3 - 1.2 mg/dL Final   ??? Bilirubin, Direct 12/10/2020 0.10  0.00 - 0.30 mg/dL Final   ??? AST 29/56/2130 22  <=34 U/L Final   ??? ALT 12/10/2020 21  10 - 49 U/L Final   ??? Alkaline Phosphatase 12/10/2020 51  46 - 116 U/L Final   ??? Sodium 12/10/2020 142  135 - 145 mmol/L Final   ???  Potassium 12/10/2020 4.5  3.5 - 5.1 mmol/L Final   ??? Chloride 12/10/2020 107  98 - 107 mmol/L Final   ??? CO2 12/10/2020 29.0  20.0 - 31.0 mmol/L Final   ??? Anion Gap 12/10/2020 6  5 - 14 mmol/L Final   ??? BUN 12/10/2020 9  9 - 23 mg/dL Final   ??? Creatinine 12/10/2020 0.72  0.60 - 0.80 mg/dL Final   ??? BUN/Creatinine Ratio 12/10/2020 13   Final   ??? eGFR CKD-EPI (2021) Female 12/10/2020 >90  >=60 mL/min/1.52m2 Final    eGFR calculated with CKD-EPI 2021 equation in accordance with [employer] and [employer] of Nephrology Task Force recommendations.   ??? Glucose 12/10/2020 101  70 - 179 mg/dL Final   ??? Calcium 16/02/9603 9.7  8.7 - 10.4 mg/dL Final   ??? PT 54/01/8118 14.0 (A) 10.3 - 13.4 sec Final   ??? INR 12/10/2020 1.20   Final   ??? WBC 12/10/2020 2.7 (A) 3.6 - 11.2 10*9/L Final   ??? RBC 12/10/2020 4.56  3.95 - 5.13 10*12/L Final   ??? HGB 12/10/2020 14.1  11.3 - 14.9 g/dL Final   ??? HCT 14/78/2956 42.1  34.0 - 44.0 % Final   ??? MCV 12/10/2020 92.4  77.6 - 95.7 fL Final   ??? MCH 12/10/2020 31.0  25.9 - 32.4 pg Final   ??? MCHC 12/10/2020 33.5  32.0 - 36.0 g/dL Final   ??? RDW 21/30/8657 12.5  12.2 - 15.2 % Final   ??? MPV 12/10/2020 9.5  6.8 - 10.7 fL Final   ??? Platelet 12/10/2020 161  150 - 450 10*9/L Final       Follow-up:     Recommend well adult check/physical in 1 year. Otherwise, follow up as below.  Return in about 3 months (around 05/19/2021) for Weight clinic F/U one slot. moujanro, nutrition.    Health maintenance reviewed and recommendations made based on [place] Preventative Task Force (USPTF) recommendations. Reviewed appropriate diet and exercise. Patient stated understanding and there were no barriers to learning.     I attest that I, Constance Goltz, personally documented this note while acting as scribe for Marshell Garfinkel, MD.      Constance Goltz, Scribe.  02/17/2021     The documentation recorded by the scribe accurately reflects the service I personally performed and the decisions made by me.    Marshell Garfinkel, MD

## 2021-02-22 NOTE — Unmapped (Signed)
Assessment/Plan:         Problem List Items Addressed This Visit        Genitourinary    Pyelonephritis     CT in ED 02/24/21 with evidence of pyelonephritis but no stones. She continues on 10d course of cefdinir. Symptoms improving but not fully resolved.  - Finish cefdinir course. Patient will update me if SWOP.             Other    Annual physical exam - Primary     Age appropriate preventative care, health maintenance, and anticipatory guidance discussed/handout provided.  Immunizations:  ?? Influenza vaccine - 02/17/21  ?? Pneumococcal Polysaccharide 23 - 06/17/2008  ?? Prevnar 20- 03/07/21  ?? TdaP - 06/23/2011  Screenings:  ?? Pap -s/p hysterectomy  ?? Hep C - neg in 2017  Ordered labs as below.         Obesity     Weight trending favorably. Following with Dr. Paulla Dolly weight clinic the last year. Pt's medication regimen includes: tirzepatide 5mg  qw, Wellbutrin XL 300mg  daily, and Naltrexone 25mg  twice daily.  - Continue current medication regimen as prescribed by Dr. Loa Socks.         03/07/21 79.3 kg (174 lb 12 oz)   02/24/21 81.6 kg (180 lb)   02/24/21 81.8 kg (180 lb 4 oz)                Other Visit Diagnoses     Need for pneumococcal vaccination        Relevant Orders    PNEUMOCOCCAL CONJUGATE VACCINE 20-VALENT          I have reviewed and addressed the patient???s adherence and response to prescribed medications. I have identified patient barriers to following the proposed medication and treatment plan, and have noted opportunities to optimize healthy behaviors. I have answered the patient???s questions to satisfaction and the patient voices understanding.      No follow-ups on file.       Subjective:            Patient ID: Tonya Brady is a 45 y.o. female who presents for follow up of the following:  Informant: Patient came to appointment alone.    Chief Complaint   Patient presents with   ??? Annual Exam       HPI    Physical exam  Pt overall healthy and well. She is working on weight loss with diet and exercise. No tobacco or drug use. No alcohol abuse. Follows with dentist and ophthalmology. Denies chest pain, palpitations, dyspnea, abdominal pain, inconsistent bowels, urinary symptoms, paresthesias, edema, rash.    Pyelonephritis  Patient presented to the ED 02/24/21 with 2 days of dysuria, flank pain, and malaise. CT Ab/P showed evidence of pyelonephritis but no stone. She was discharged with cefdinir x 10d. Today she reports symptoms are improving but still present. Persisting symptoms include vaginal burning and mild flank tenderness. Urine was dark yellow this morning, no blood or odor.    Obesity  Weight trending favorably. Highest weight was 212. Following with Dr. Paulla Dolly weight clinic the last year. Pt's medication regimen includes: tirzepatide 5mg  qw, Wellbutrin XL 300mg  daily, and Naltrexone 25mg  twice daily. Denies side effects.    Last office visit:  NAFLD with advanced fibrosis/cirrhosis  Getting [place] q7mo for tumor surveillance as well as LFTs. Following with [facility]. Last US abdomen 12/10/20 with Heterogenous liver with mildly undulated contours, likely representing chronic liver disease.  No focal liver  lesion identifid.    Lab Results   Component Value Date    ALKPHOS 49 02/24/2021    BILITOT 0.4 02/24/2021    BILIDIR 0.10 12/10/2020    PROT 6.6 02/24/2021    ALBUMIN 3.7 02/24/2021    ALT 16 02/24/2021    AST 19 02/24/2021     MDD/GAD  Stable with Wellbutrin SR 150mg  BID. She is seeing [place] neuropsychiatry.  PHQ-9 PHQ-9 TOTAL SCORE   12/07/2020 4   06/13/2019 4   05/12/2019 4   06/03/2018 3   02/19/2017 0     SLE   Pt currently taking Plaquenil 200 mg BID with famotidine. Also taking Benlysta 200 mg injections weekly. Endorses improved energy levels since starting Benlysta, but has also restarted exercising recently. XR left and right hand on 12/31/2017 revealed nondisplaced fracture of the distal tuft of the left first distal phalanx. No evidence of erosive or inflammatory arthropathy. 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 Following with rheumatology.      ROS  Comprehensive ROS negative except as per HPI    Outpatient Medications Prior to Visit   Medication Sig Dispense Refill   ??? belimumab (BENLYSTA) 200 mg/mL AtIn Inject the contents of 1 injector (200mg ) under the skin every 7 days 4 mL 2   ??? buPROPion (WELLBUTRIN XL) 300 MG 24 hr tablet Take 1 tablet (300 mg total) by mouth every morning. 60 tablet 2   ??? empty container Misc Use as directed 1 each 2   ??? famotidine (PEPCID) 40 MG tablet TAKE 1 TABLET(40 MG) BY MOUTH EVERY NIGHT AS NEEDED FOR HEARTBURN 90 tablet 3   ??? fluticasone propionate (FLONASE) 50 mcg/actuation nasal spray 1 spray into each nostril daily. 16 g 11   ??? hydrOXYchloroQUINE (PLAQUENIL) 200 mg tablet Take 1 tablet (200 mg total) by mouth Two (2) times a day. 180 tablet 1   ??? ibuprofen (MOTRIN) 800 MG tablet ibuprofen 800 mg tablet     ??? lidocaine-menthol 4-1 % PtMd Apply topically.     ??? melatonin 1 mg Tab tablet Take by mouth nightly as needed.      ??? naltrexone (DEPADE) 50 mg tablet Take 0.5 tablets (25 mg total) by mouth two (2) times a day. 60 tablet 2   ??? tirzepatide (MOUNJARO) 5 mg/0.5 mL PnIj Inject 5 mg under the skin once a week. 2 mL 1     No facility-administered medications prior to visit.       The following portions of the patient's history were reviewed and updated as appropriate: allergies, current medications, past medical history, past social history and problem list.          Objective:            Vital Signs  BP 109/64  - Pulse 62  - Temp 36.1 ??C (96.9 ??F) (Temporal)  - Ht 165.1 cm (5' 5)  - Wt 79.3 kg (174 lb 12 oz)  - SpO2 98%  - BMI 29.08 kg/m??      Exam  General appearance: alert, cooperative, NAD   Head: normocephalic and atraumatic  Eyes: normal EOMs, PERRL, normal conjunctiva, no discharge  ENT: OP clear and moist, no tonsillar exudates or erythema, normal TMs and external ear canals, normal nasal passages  Neck: normal ROM, supple, no thyromegaly, no cervical/supraclavicular lymphadenopathy  Lungs: normal work of breathing, good air entry and clear to auscultation bilaterally, no wheezes or crackles appreciated   Heart: regular rate  and rhythm, S1, S2 normal, no murmur, click, rub or gallop   Abdomen: Abdomen soft, non-tender, non distended. No masses, no organomegaly. Bowel sounds present.  Extremities: extremities normal, atraumatic, no cyanosis or edema  NEURO: AAOx3, appropriately responds to questions, spontaneous movement of extremities  Psych: normal mood and affect       I attest that I, Harvest Forest, personally documented this note while acting as a scribe for Alexandria Lodge, MD.     Harvest Forest, Scribe.     The documentation recorded by the scribe accurately reflects the service I personally performed and the decisions made by me.     Alexandria Lodge, MD

## 2021-02-23 DIAGNOSIS — Z1231 Encounter for screening mammogram for malignant neoplasm of breast: Principal | ICD-10-CM

## 2021-02-23 MED ORDER — MOUNJARO 5 MG/0.5 ML SUBCUTANEOUS PEN INJECTOR
SUBCUTANEOUS | 1 refills | 0.00000 days | Status: CP
Start: 2021-02-23 — End: ?

## 2021-02-24 ENCOUNTER — Emergency Department
Admit: 2021-02-24 | Discharge: 2021-02-24 | Disposition: A | Discharge status: Home / Self Care | Payer: PRIVATE HEALTH INSURANCE | Attending: Emergency Medicine

## 2021-02-24 ENCOUNTER — Ambulatory Visit
Admit: 2021-02-24 | Discharge: 2021-02-25 | Disposition: A | Discharge status: Home / Self Care | Payer: PRIVATE HEALTH INSURANCE | Attending: Family Medicine | Primary: Family Medicine

## 2021-02-24 ENCOUNTER — Ambulatory Visit
Admit: 2021-02-24 | Discharge: 2021-02-24 | Disposition: A | Discharge status: Home / Self Care | Payer: PRIVATE HEALTH INSURANCE | Attending: Emergency Medicine

## 2021-02-24 DIAGNOSIS — R110 Nausea: Principal | ICD-10-CM

## 2021-02-24 DIAGNOSIS — R059 Cough, unspecified type: Principal | ICD-10-CM

## 2021-02-24 DIAGNOSIS — R109 Unspecified abdominal pain: Principal | ICD-10-CM

## 2021-02-24 DIAGNOSIS — N12 Tubulo-interstitial nephritis, not specified as acute or chronic: Principal | ICD-10-CM

## 2021-02-24 LAB — CBC W/ AUTO DIFF
BASOPHILS ABSOLUTE COUNT: 0 10*9/L (ref 0.0–0.1)
BASOPHILS RELATIVE PERCENT: 0.3 %
EOSINOPHILS ABSOLUTE COUNT: 0.1 10*9/L (ref 0.0–0.5)
EOSINOPHILS RELATIVE PERCENT: 1.1 %
HEMATOCRIT: 40.3 % (ref 34.0–44.0)
HEMOGLOBIN: 13.4 g/dL (ref 11.3–14.9)
LYMPHOCYTES ABSOLUTE COUNT: 0.7 10*9/L — ABNORMAL LOW (ref 1.1–3.6)
LYMPHOCYTES RELATIVE PERCENT: 12.9 %
MEAN CORPUSCULAR HEMOGLOBIN CONC: 33.3 g/dL (ref 32.0–36.0)
MEAN CORPUSCULAR HEMOGLOBIN: 30.3 pg (ref 25.9–32.4)
MEAN CORPUSCULAR VOLUME: 91 fL (ref 77.6–95.7)
MEAN PLATELET VOLUME: 9.7 fL (ref 6.8–10.7)
MONOCYTES ABSOLUTE COUNT: 0.5 10*9/L (ref 0.3–0.8)
MONOCYTES RELATIVE PERCENT: 9.6 %
NEUTROPHILS ABSOLUTE COUNT: 4 10*9/L (ref 1.8–7.8)
NEUTROPHILS RELATIVE PERCENT: 76.1 %
NUCLEATED RED BLOOD CELLS: 0 /100{WBCs} (ref <=4)
PLATELET COUNT: 152 10*9/L (ref 150–450)
RED BLOOD CELL COUNT: 4.43 10*12/L (ref 3.95–5.13)
RED CELL DISTRIBUTION WIDTH: 12.9 % (ref 12.2–15.2)
WBC ADJUSTED: 5.3 10*9/L (ref 3.6–11.2)

## 2021-02-24 LAB — COMPREHENSIVE METABOLIC PANEL
ALBUMIN: 3.7 g/dL (ref 3.4–5.0)
ALKALINE PHOSPHATASE: 49 U/L (ref 46–116)
ALT (SGPT): 16 U/L (ref 10–49)
ANION GAP: 8 mmol/L (ref 5–14)
AST (SGOT): 19 U/L (ref <=34)
BILIRUBIN TOTAL: 0.4 mg/dL (ref 0.3–1.2)
BLOOD UREA NITROGEN: 9 mg/dL (ref 9–23)
BUN / CREAT RATIO: 12
CALCIUM: 8.8 mg/dL (ref 8.7–10.4)
CHLORIDE: 109 mmol/L — ABNORMAL HIGH (ref 98–107)
CO2: 27.1 mmol/L (ref 20.0–31.0)
CREATININE: 0.78 mg/dL
EGFR CKD-EPI (2021) FEMALE: >90 mL/min/{1.73_m2} (ref >=60)
GLUCOSE RANDOM: 76 mg/dL (ref 70–179)
POTASSIUM: 4.1 mmol/L (ref 3.4–4.8)
PROTEIN TOTAL: 6.6 g/dL (ref 5.7–8.2)
SODIUM: 144 mmol/L (ref 135–145)

## 2021-02-24 LAB — URINALYSIS WITH CULTURE REFLEX
BILIRUBIN UA: NEGATIVE
GLUCOSE UA: NEGATIVE
KETONES UA: NEGATIVE
NITRITE UA: POSITIVE — AB
PH UA: 5.5 (ref 5.0–9.0)
PROTEIN UA: 100 — AB
RBC UA: 10 /HPF — ABNORMAL HIGH (ref <4)
SPECIFIC GRAVITY UA: 1.025 (ref 1.005–1.040)
SQUAMOUS EPITHELIAL: 2 /HPF (ref 0–5)
UROBILINOGEN UA: 0.2
WBC UA: >100 /HPF — ABNORMAL HIGH (ref 0–5)

## 2021-02-24 MED ORDER — CEFDINIR 300 MG CAPSULE
ORAL_CAPSULE | Freq: Every day | ORAL | 0 refills | 10 days | Status: CP
Start: 2021-02-24 — End: 2021-03-06

## 2021-02-24 MED ADMIN — ketorolac (TORADOL) injection 30 mg: 30 mg | INTRAVENOUS | @ 22:00:00 | Stop: 2021-02-24

## 2021-02-24 MED ADMIN — cefTRIAXone (ROCEPHIN) 1 g in sodium chloride 0.9 % (NS) 100 mL IVPB-connector bag: 1 g | INTRAVENOUS | @ 22:00:00 | Stop: 2021-02-24

## 2021-02-24 MED ADMIN — promethazine (PHENERGAN) injection 25 mg: 25 mg | INTRAMUSCULAR | @ 20:00:00 | Stop: 2021-02-24

## 2021-02-24 NOTE — Unmapped (Signed)
Assessment/ Plan:  Problem List Items Addressed This Visit     Acute right flank pain     Patient complains of congestion, body aches, constant R flank pain, dysuria, urinary frequency, sinus headache, nausea, fever, chills. She states she received her influenza vaccine 02/17/21 last week and has felt poorly since then. Flank pain and dysuria increased in intensity last night. She states symptoms are similar to what she felt with nephrolithiasis. She has been exposed to RSV as her daughter is a Orthoptist. She took advil 4 tabs this morning which helped relieve her nausea. Hx of nephrolithiasis x 2, bladder infections. UA with Mild leukocytes, moderate blood, protein 30mg /dl. PE notable for RLQ tenderness and R CVA tenderness.  - Administered phenergan 25mg /mL IM.  - Specimen collected for COVID19 testing.  - Due to concern over pyelonephritis & nephrolithiasis and medication contraindications, recommended for patient to be seen in ED for further evaluation.  Also pain not controlled.  Patient is on Plaquenil for lupus and Cipro is contraindicated.  Patient agreeable. Called ED RN to notify them of incoming patient.         Relevant Orders    POCT urinalysis dipstick (Completed)    Urine culture    Nausea     Administered phenergan 25mg /mL IM.         Relevant Medications    promethazine (PHENERGAN) injection 25 mg (Start on 02/24/2021  4:00 PM)      Other Visit Diagnoses     Cough, unspecified type    -  Primary    Relevant Orders    Respiratory Pathogen Panel with COVID-19          No follow-ups on file.      Medication adherence and barriers to the treatment plan have been addressed. Opportunities to optimize healthy behaviors have been discussed. Patient / caregiver voiced understanding.      Time: Greater than 50% of this encounter was spent in direct consultation with the patient in evaluation and discussing the following.  Duration of encounter: 20 min     Subjective:     Social: Patient here with her husband. Household includes      History of Present Illness     Tonya Brady is a 45 y.o. female who presents for Lower Back pain, Urinary Frequency, Burning while Urination and Sinus headache, Body aches, Congestion X 7 days      Patient complains of congestion, body aches, constant R flank pain, dysuria, urinary frequency, sinus headache, nausea, fever, chills. She states she received her influenza vaccine 02/17/21 last week and has felt poorly since then. Flank pain and dysuria increased in intensity last night. She states symptoms are similar to what she felt with nephrolithiasis. She has been exposed to RSV as her daughter is a Orthoptist. She took advil 4 tabs this morning which helped relieve her nausea. Hx of nephrolithiasis x 2, bladder infections.    UA with Mild leukocytes, moderate blood, protein 30mg /dl      Health Maintenance   Topic Date Due   ??? Pneumococcal Vaccine 0-64 (2 - PCV) 03/06/2020   ??? COVID-19 Vaccine (3 - Booster for Janssen series) 05/31/2020   ??? DTaP/Tdap/Td Vaccines (2 - Td or Tdap) 06/22/2021   ??? Lipid Screening  03/03/2025   ??? Hepatitis C Screen  Completed   ??? Influenza Vaccine  Completed            Review of Systems     A  10+ point review of systems was negative unless stated otherwise in the HPI.             Review of History     MEDICATIONS:        Current Outpatient Medications on File Prior to Visit   Medication Sig Dispense Refill   ??? belimumab (BENLYSTA) 200 mg/mL AtIn Inject the contents of 1 injector (200mg ) under the skin every 7 days 4 mL 2   ??? buPROPion (WELLBUTRIN XL) 300 MG 24 hr tablet Take 1 tablet (300 mg total) by mouth every morning. 60 tablet 2   ??? empty container Misc Use as directed 1 each 2   ??? famotidine (PEPCID) 40 MG tablet TAKE 1 TABLET(40 MG) BY MOUTH EVERY NIGHT AS NEEDED FOR HEARTBURN 90 tablet 3   ??? fluticasone propionate (FLONASE) 50 mcg/actuation nasal spray 1 spray into each nostril daily. 16 g 11   ??? hydrOXYchloroQUINE (PLAQUENIL) 200 mg tablet Take 1 tablet (200 mg total) by mouth Two (2) times a day. 180 tablet 1   ??? ibuprofen (MOTRIN) 800 MG tablet ibuprofen 800 mg tablet     ??? lidocaine-menthol 4-1 % PtMd Apply topically.     ??? melatonin 1 mg Tab tablet Take by mouth nightly as needed.      ??? naltrexone (DEPADE) 50 mg tablet Take 0.5 tablets (25 mg total) by mouth two (2) times a day. 60 tablet 2   ??? tirzepatide (MOUNJARO) 5 mg/0.5 mL PnIj Inject 5 mg under the skin once a week. 2 mL 1     No current facility-administered medications on file prior to visit.        ALLERGIES:      Aspirin, Fish containing products, Iodine, and Shellfish containing products         PAST MEDICAL HISTORY:     Past Medical History:   Diagnosis Date   ??? Bulging lumbar disc 2005   ??? Cirrhosis of liver (CMS-HCC)    ??? Depression    ??? Diabetes mellitus (CMS-HCC)    ??? Difficult intravenous access    ??? Fibromyalgia 03/09/2013   ??? NASH (nonalcoholic steatohepatitis)    ??? Secondary Sjogren's syndrome (CMS-HCC) 03/09/2013    Secondary to SLE  Diagnosed: 12/2003 (by sx's, parotid gland enlargement, and serologies) Pathology: biopsy not done Labs: + ANA, anti-SSA, anti-SSB, RF    ??? Systemic lupus erythematosus (CMS-HCC) 01/19/2011   ??? Valvular regurgitation        PAST SURGICAL HISTORY:     Past Surgical History:   Procedure Laterality Date   ??? CARPAL TUNNEL RELEASE     ??? CESAREAN SECTION     ??? CHOLECYSTECTOMY     ??? HERNIA REPAIR     ??? HYSTERECTOMY     ??? PR REVISE MEDIAN N/CARPAL TUNNEL SURG Left 12/30/2018    Procedure: R16 NEUROPLASTY AND/OR TRANSPOSITION; MEDIAN NERVE AT CARPAL TUNNEL;  Surgeon: Theodora Blow Jacqlyn Krauss, MD;  Location: [facility];  Service: Orthopedics   ??? PR SALIVARY SURG UNLISTED PROC Right 12/06/2018    Procedure: R22 UNLISTED PROC SALIVARY GLANDS/DUCTS;  Surgeon: Michelle Piper, MD;  Location: [facility] OR [facility];  Service: ENT   ??? PR SALIVARY SURG UNLISTED PROC Left 02/18/2019    Procedure: R25 UNLISTED PROC SALIVARY GLANDS/DUCTS;  Surgeon: Michelle Piper, MD;  Location: [facility] OR [facility];  Service: ENT   ??? PR UPPER GI ENDOSCOPY,DIAGNOSIS N/A 05/10/2016    Procedure: UGI ENDO, INCLUDE ESOPHAGUS, STOMACH, & DUODENUM &/OR  JEJUNUM; DX W/WO COLLECTION SPECIMN, BY BRUSH OR WASH;  Surgeon: Carmon Ginsberg, MD;  [facility];  Service: Gastroenterology   ??? PR UPPER GI ENDOSCOPY,DIAGNOSIS N/A 05/02/2019    Procedure: UGI ENDO, INCLUDE ESOPHAGUS, STOMACH, & DUODENUM &/OR JEJUNUM; DX W/WO COLLECTION SPECIMN, BY BRUSH OR WASH;  Surgeon: Chriss Driver, MD;  [facility];  Service: Gastroenterology   ??? TOTAL ABDOMINAL HYSTERECTOMY     ??? TUBAL LIGATION         PROBLEM LIST:     Patient Active Problem List    Diagnosis Date Noted   ??? Acute right flank pain 02/24/2021   ??? Nausea 02/24/2021   ??? Obesity 03/03/2020   ??? COVID-19 06/18/2019   ??? Class 2 obesity 06/13/2019   ??? Insomnia 06/13/2019   ??? Acute cystitis without hematuria 05/12/2019   ??? Swelling of left parotid gland 12/23/2018   ??? Swelling of right parotid gland 11/11/2018   ??? Acquired trigger finger 09/19/2018   ??? Annual physical exam 11/28/2017   ??? Acute carpal tunnel syndrome of left wrist 11/08/2017   ??? Acute left ankle pain 05/29/2017   ??? Gastroesophageal reflux disease 08/09/2016   ??? GAD (generalized anxiety disorder) 08/09/2016   ??? Moderate episode of recurrent major depressive disorder (CMS-HCC) 07/10/2016   ??? 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 Chronic abdominal pain 02/10/2016   ??? Palpitations 11/11/2015   ??? Thrombocytopenia (CMS-HCC) 11/10/2015   ??? Restless leg syndrome 06/30/2013   ??? Secondary Sjogren's syndrome (CMS-HCC) 03/09/2013   ??? Fibromyalgia 03/09/2013   ??? Allergic rhinitis 01/20/2011   ??? Systemic lupus erythematosus (CMS-HCC) 01/19/2011           SOCIAL HISTORY:      reports that she has never smoked. She has never used smokeless tobacco. She reports that she does not drink alcohol and does not use drugs.   FAMILY HISTORY:     family history includes Aneurysm in her maternal aunt; Breast cancer in her maternal aunt, maternal grandmother, and paternal grandmother; Cancer in her mother; Depression in her son; Diabetes in her maternal aunt, maternal grandmother, maternal uncle, and paternal grandmother; Heart attack in her father and maternal uncle; Heart disease in her father; Hypertension in her maternal grandmother and mother; Mental illness in her son; Thyroid disease in her maternal grandmother and paternal grandmother.       Objective:    BP 102/68  - Pulse 72  - Temp 36.9 ??C (98.5 ??F) (Oral)  - Wt 81.8 kg (180 lb 4 oz)  - SpO2 98%  - BMI 35.20 kg/m??  - Body mass index is 35.2 kg/m??.     Physical exam:  GENERAL:  Well appearing, well nourished patient distressed with pain  SKIN: no obvious rash or  prominent lesions     CARDIOVASCULAR:  RRR Normal S1 and S2. No murmurs or rubs.  RESPIRATORY: No chest deformities, Good air entry bilaterally. No rales or rhonchi.   ABDOMEN: RLQ tenderness.  Right CVA tenderness  EXTREMITIES: No clubbing, cyanosis, or edema  NEUROLOGIC: Alert and Oriented x3  PSYCHIATRIC:   intact judgement and insight. Normal mood and affect.           I attest that I, Harvest Forest, personally documented this note while acting as scribe for Lanny Hurst, MD.      Harvest Forest, Scribe.  02/24/2021     The documentation recorded  by the scribe accurately reflects the service I personally performed and the decisions made by me.    Lanny Hurst, MD

## 2021-02-24 NOTE — Unmapped (Signed)
Pt ambulatory to triage for right sided flank pain x2days with concerns of kidney stone.

## 2021-02-24 NOTE — Unmapped (Signed)
Administered phenergan 25mg /mL IM.

## 2021-02-24 NOTE — Unmapped (Signed)
[facility]  Emergency Department Medical Screening Examination     Subjective     Tonya Brady is a 45 y.o. female presenting for evaluation of Flank Pain. Pain started 2 days ago in right flank.  Has h/o kidney stones and pain feels similar.  Asso with subjective fever and chills.  Also reports dysuria.  Patient reports she is also had runny nose, nasal congestion, and cough for the last 5 days after receiving a flu shot.  She did receive a COVID test at her PMD office today, which is pending.     Abbreviated Review of Systems/Covid Screen  Constitutional: Positive for fever (subjective)  Respiratory: Positive for cough. Negative for difficulty breathing.  Cards:  Neg for chest pain  Abd:  Pos for right flank pain and abd pain.     Objective     Vitals:    02/24/21 1521 02/24/21 1522 02/24/21 1523 02/24/21 1525   BP: 131/86      Pulse: 65      Resp: 20      Temp:    36.7 ??C (98.1 ??F)   TempSrc: Oral Oral  Oral   SpO2: 100%      Weight:   81.6 kg (180 lb)    Height:   165.1 cm (5' 5)           Focused Physical Exam  Constitutional: No acute distress.  Respiratory: Non-labored respirations.  Neurological: Clear speech. No gross focal neurologic deficits are appreciated.  Abd:  Pos for CVA tenderness and diffuse abd tenderness.   ?  Assessment & Plan     Tonya Brady is a 45 y.o. female with history of kidney stones who presents to the emergency department for evaluation of right flank pain that started 2 days ago.    Plan labs/UA.  Will: Review UA, and if positive for blood, will consider CT imaging to evaluate kidney stone.      A medical screening exam has been performed. The patient will be moved imminently to an appropriate ED location for continued evaluation and stabilization of a possible emergency medical condition. The patient is aware that this is an initial encounter only and verbalizes understanding and agreement with the plan.     Emergency Department operations continue to be impacted by the COVID-19 pandemic.     Theodoro Grist, FNP  February 24, 2021 3:20 PM

## 2021-02-24 NOTE — Unmapped (Addendum)
Patient complains of congestion, body aches, constant R flank pain, dysuria, urinary frequency, sinus headache, nausea, fever, chills. She states she received her influenza vaccine 02/17/21 last week and has felt poorly since then. Flank pain and dysuria increased in intensity last night. She states symptoms are similar to what she felt with nephrolithiasis. She has been exposed to RSV as her daughter is a Orthoptist. She took advil 4 tabs this morning which helped relieve her nausea. Hx of nephrolithiasis x 2, bladder infections. UA with Mild leukocytes, moderate blood, protein 30mg /dl. PE notable for RLQ tenderness and R CVA tenderness.  - Administered phenergan 25mg /mL IM.  - Specimen collected for COVID19 testing.  - Due to concern over pyelonephritis & nephrolithiasis and medication contraindications, recommended for patient to be seen in ED for further evaluation.  Also pain not controlled.  Patient is on Plaquenil for lupus and Cipro is contraindicated.  Patient agreeable. Called ED RN to notify them of incoming patient.

## 2021-02-25 NOTE — Unmapped (Signed)
[facility]  Emergency Department Provider Note        ED Clinical Impression      Final diagnoses:   None           Impression, ED Course, Assessment and Plan      Impression: Tonya Brady is a 45 y.o. female with PMH of  kidney stones, NASH cirrohsis, Sjogren's syndrome, lupus, and T2DM presenting for ***.     On exam, the patient appears well and is in NAD. VS are WNL. Exam is remarkable for ***    Differential includes ***    Plan for CTAP, basic labs, and UA. Will give Rocephin, Morphine, and Tylenol.    ED Course:    5:39 PM  CT AP shows mild prominence of the proximal right ureter, subtly increased when compared to 2018. Otherwise, no obstructing renal calculi or other findings to explain the patient's symptoms. UA shows small leukocyte esterase, positive nitrites, proteinuria, moderate blood, >100 WBCs, and many bacteria.              Additional Medical Decision Making     I have reviewed the vital signs and the nursing notes. Labs and radiology results that were available during my care of the patient were independently reviewed by me and considered in my medical decision making. I discussed the case with Dr. Eulah Pont, ED Attending.      History        Chief Complaint  Flank Pain      HPI   Tonya Brady is a 45 y.o. female with a PMH of kidney stones, NASH cirrohsis, Sjogren's syndrome, lupus, and T2DM  who presents to the ED for flank pain. The patient reports 2 days of R flank pain with associated subjective fever, chills, and dysuria.     Past Medical History:   Diagnosis Date   ??? Bulging lumbar disc 2005   ??? Cirrhosis of liver (CMS-HCC)    ??? Depression    ??? Diabetes mellitus (CMS-HCC)    ??? Difficult intravenous access    ??? Fibromyalgia 03/09/2013   ??? NASH (nonalcoholic steatohepatitis)    ??? 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 Systemic lupus erythematosus (CMS-HCC) 01/19/2011   ??? Valvular regurgitation        Patient Active Problem List   Diagnosis   ??? Systemic lupus erythematosus (CMS-HCC)   ??? Allergic rhinitis   ??? Secondary Sjogren's syndrome (CMS-HCC)   ??? Fibromyalgia   ??? Restless leg syndrome   ??? Thrombocytopenia (CMS-HCC)   ??? Palpitations   ??? Chronic abdominal pain   ??? 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 Moderate episode of recurrent major depressive disorder (CMS-HCC)   ??? Gastroesophageal reflux disease   ??? GAD (generalized anxiety disorder)   ??? Acute left ankle pain   ??? Acute carpal tunnel syndrome of left wrist   ??? Annual physical exam   ??? Acquired trigger finger   ??? Swelling of right parotid gland   ??? Swelling of left parotid gland   ??? Acute cystitis without hematuria   ??? Class 2 obesity   ??? Insomnia   ??? COVID-19   ??? Obesity   ??? Acute right flank pain   ??? Nausea       Past Surgical History:   Procedure Laterality Date   ???  CARPAL TUNNEL RELEASE     ??? CESAREAN SECTION     ??? CHOLECYSTECTOMY     ??? HERNIA REPAIR     ??? HYSTERECTOMY     ??? PR REVISE MEDIAN N/CARPAL TUNNEL SURG Left 12/30/2018    Procedure: R16 NEUROPLASTY AND/OR TRANSPOSITION; MEDIAN NERVE AT CARPAL TUNNEL;  Surgeon: Theodora Blow Jacqlyn Krauss, MD;  Location: ASC OR [facility];  Service: Orthopedics   ??? PR SALIVARY SURG UNLISTED PROC Right 12/06/2018    Procedure: R22 UNLISTED PROC SALIVARY GLANDS/DUCTS;  Surgeon: Michelle Piper, MD;  Location: ASC OR [facility];  Service: ENT   ??? PR SALIVARY SURG UNLISTED PROC Left 02/18/2019    Procedure: R25 UNLISTED PROC SALIVARY GLANDS/DUCTS;  Surgeon: Michelle Piper, MD;  Location: ASC OR [facility];  Service: ENT   ??? PR UPPER GI ENDOSCOPY,DIAGNOSIS N/A 05/10/2016    Procedure: UGI ENDO, INCLUDE ESOPHAGUS, STOMACH, & DUODENUM &/OR JEJUNUM; DX W/WO COLLECTION SPECIMN, BY BRUSH OR WASH;  Surgeon: Carmon Ginsberg, MD;  Location: [facility];  Service: Gastroenterology   ??? PR UPPER GI ENDOSCOPY,DIAGNOSIS N/A 05/02/2019    Procedure: UGI ENDO, INCLUDE ESOPHAGUS, STOMACH, & DUODENUM &/OR JEJUNUM; DX W/WO COLLECTION SPECIMN, BY BRUSH OR WASH;  Surgeon: Chriss Driver, MD;  Location: [facility];  Service: Gastroenterology   ??? TOTAL ABDOMINAL HYSTERECTOMY     ??? TUBAL LIGATION         No current facility-administered medications for this encounter.    Current Outpatient Medications:   ???  belimumab (BENLYSTA) 200 mg/mL AtIn, Inject the contents of 1 injector (200mg ) under the skin every 7 days, Disp: 4 mL, Rfl: 2  ???  buPROPion (WELLBUTRIN XL) 300 MG 24 hr tablet, Take 1 tablet (300 mg total) by mouth every morning., Disp: 60 tablet, Rfl: 2  ???  empty container Misc, Use as directed, Disp: 1 each, Rfl: 2  ???  famotidine (PEPCID) 40 MG tablet, TAKE 1 TABLET(40 MG) BY MOUTH EVERY NIGHT AS NEEDED FOR HEARTBURN, Disp: 90 tablet, Rfl: 3  ???  fluticasone propionate (FLONASE) 50 mcg/actuation nasal spray, 1 spray into each nostril daily., Disp: 16 g, Rfl: 11  ???  hydrOXYchloroQUINE (PLAQUENIL) 200 mg tablet, Take 1 tablet (200 mg total) by mouth Two (2) times a day., Disp: 180 tablet, Rfl: 1  ???  ibuprofen (MOTRIN) 800 MG tablet, ibuprofen 800 mg tablet, Disp: , Rfl:   ???  lidocaine-menthol 4-1 % PtMd, Apply topically., Disp: , Rfl:   ???  melatonin 1 mg Tab tablet, Take by mouth nightly as needed. , Disp: , Rfl:   ???  naltrexone (DEPADE) 50 mg tablet, Take 0.5 tablets (25 mg total) by mouth two (2) times a day., Disp: 60 tablet, Rfl: 2  ???  tirzepatide (MOUNJARO) 5 mg/0.5 mL PnIj, Inject 5 mg under the skin once a week., Disp: 2 mL, Rfl: 1    Allergies  Aspirin, Fish containing products, Iodine, and Shellfish containing products    Family History   Problem Relation Age of Onset   ??? Cancer Mother    ??? Hypertension Mother    ??? Heart disease Father    ??? Heart attack Father    ??? Diabetes Maternal Grandmother    ??? Hypertension Maternal Grandmother    ??? Thyroid disease Maternal Grandmother    ??? Breast cancer Maternal Grandmother    ??? Breast cancer Paternal Grandmother    ??? Diabetes Paternal Grandmother    ??? Thyroid disease Paternal Grandmother    ???  Depression Son    ??? Mental illness Son    ??? Breast cancer Maternal Aunt    ??? Aneurysm Maternal Aunt    ??? Diabetes Maternal Aunt    ??? Diabetes Maternal Uncle    ??? Heart attack Maternal Uncle    ??? Melanoma Neg Hx    ??? Basal cell carcinoma Neg Hx    ??? Squamous cell carcinoma Neg Hx    ??? Drug abuse Neg Hx    ??? Stroke Neg Hx        Social History  Social History     Tobacco Use   ??? Smoking status: Never Smoker   ??? Smokeless tobacco: Never Used   Vaping Use   ??? Vaping Use: Never used   Substance Use Topics   ??? Alcohol use: No     Alcohol/week: 0.0 standard drinks   ??? Drug use: No       Review of Systems  Constitutional: Negative for fevers, chills  Eyes: Negative for visual changes.  ENT: Negative for sore throat.  Cardiovascular: Negative for chest pain.  Respiratory: Negative for shortness of breath.  Gastrointestinal: Negative for nausea, vomiting, diarrhea  Genitourinary: Negative for dysuria.   Musculoskeletal: Negative for back pain.  Skin: Negative for rash.  Neurological: Negative for headaches, focal weakness or numbness.     Physical Exam     ED Triage Vitals   Enc Vitals Group      BP 02/24/21 1521 131/86      Heart Rate 02/24/21 1521 65      SpO2 Pulse --       Resp 02/24/21 1521 20      Temp 02/24/21 1525 36.7 ??C (98.1 ??F)      Temp Source 02/24/21 1521 Oral      SpO2 02/24/21 1521 100 %      Weight 02/24/21 1523 81.6 kg (180 lb)      Height 02/24/21 1523 1.651 m (5' 5)     Constitutional: ***  Eyes: Conjunctivae are normal.  ENT       Head: Normocephalic and atraumatic.       Nose: No congestion.       Mouth/Throat: Mucous membranes are moist.       Neck: No stridor.  Hematological/Lymphatic/Immunilogical: No cervical lymphadenopathy.  Cardiovascular: RRR, no murmurs; normal and symmetric distal pulses are present in all extremities.  Respiratory: Lungs clear to ascultation bilaterally, no wheezes, crackles, rales; no increased work of breathing.   Gastrointestinal: Soft, non-distended, non-tender; no rebound tenderness or guarding.   Genitourinary: ***  Musculoskeletal: Normal range of motion in all extremities. No lower extremity edema or tenderness.   Neurologic: Cranial nerves II-XII intact. Normal speech and language. No gross focal neurologic deficits are appreciated.  Skin: Skin is warm, dry and intact. No rash noted.  Psychiatric: Mood and affect are normal. Speech and behavior are normal.     EKG     Per my personal review, EKG shows *** rhythm, *** axis, PR, QRS, QT intervals within normal limits, ***     Radiology     CT abdomen pelvis without contrast   Final Result   --No obstructing renal calculi. No clear findings to explain acute right flank pain. Mild prominence of the proximal right ureter, subtly increased when compared to 2018. No significant perinephric stranding. Note, limited evaluation for pyelonephritis on unenhanced examination.                  Pertinent  labs & imaging results that were available during my care of the patient were reviewed by me and considered in my medical decision making (see chart for details).     Documentation assistance was provided by Meryle Ready, Scribe, on February 24, 2021 at 5:35 PM for Clancy Gourd, MD, MPH .    {*** note to provider: please use .[email or web address] to enter a scribe attestation}    Please note- This chart has been created using [employer]. Chart creation errors have been sought, but may not always be located and such creation errors, especially pronoun confusion, do NOT reflect on the standard of medical care.

## 2021-02-27 NOTE — Unmapped (Signed)
Urine Culture  Order: [id number] - Reflex for Order [id number]  Status: Final result ??    >100,000 CFU/mL Klebsiella pneumoniae??Abnormal??       Specimen Source: Clean Catch    cefdinir (OMNICEF) 300 MG capsule [[id number]]

## 2021-03-07 ENCOUNTER — Ambulatory Visit
Admit: 2021-03-07 | Discharge: 2021-03-08 | Payer: PRIVATE HEALTH INSURANCE | Attending: Family Medicine | Primary: Family Medicine

## 2021-03-07 DIAGNOSIS — Z23 Encounter for immunization: Principal | ICD-10-CM

## 2021-03-07 DIAGNOSIS — E669 Obesity, unspecified: Principal | ICD-10-CM

## 2021-03-07 DIAGNOSIS — Z0000 Encounter for general adult medical examination without abnormal findings: Principal | ICD-10-CM

## 2021-03-07 DIAGNOSIS — N12 Tubulo-interstitial nephritis, not specified as acute or chronic: Principal | ICD-10-CM

## 2021-03-07 NOTE — Unmapped (Signed)
CT in ED 02/24/21 with evidence of pyelonephritis but no stones. She continues on 10d course of cefdinir. Symptoms improving but not fully resolved.  - Finish cefdinir course. Patient will update me if SWOP.

## 2021-03-07 NOTE — Unmapped (Addendum)
Age appropriate preventative care, health maintenance, and anticipatory guidance discussed/handout provided.  Immunizations:  ?? Influenza vaccine - 02/17/21  ?? Pneumococcal Polysaccharide 23 - 06/17/2008  ?? Prevnar 20- 03/07/21  ?? TdaP - 06/23/2011  Screenings:  ?? Pap -s/p hysterectomy  ?? Hep C - neg in 2017  Ordered labs as below.

## 2021-03-07 NOTE — Unmapped (Signed)
Weight trending favorably. Following with Dr. Paulla Dolly weight clinic the last year. Pt's medication regimen includes: tirzepatide 5mg  qw, Wellbutrin XL 300mg  daily, and Naltrexone 25mg  twice daily.  - Continue current medication regimen as prescribed by Dr. Loa Socks.         03/07/21 79.3 kg (174 lb 12 oz)   02/24/21 81.6 kg (180 lb)   02/24/21 81.8 kg (180 lb 4 oz)

## 2021-03-07 NOTE — Unmapped (Signed)
Patient in clinic for office visit, Prevnar 20 vaccine administered per protocol,NCIR reviewed and vaccine accuracy verified with teammates: Renold Genta, CMA, patient eligible to receive vacstock: Private Vaccine, vaccine administered Left Deltoid, pt tolerated well, no s/s of a reaction noted, VIS given to patient

## 2021-03-08 NOTE — Unmapped (Signed)
[facility] Refill Coordination Note    Specialty Medication(s) to be Shipped:   Inflammatory Disorders: Benlysta    Other medication(s) to be shipped: No additional medications requested for fill at this time     Tonya Brady, DOB: [birth year]  Phone: [phone] (home)       All above HIPAA information was verified with patient.     Was a Nurse, learning disability used for this call? No    Completed refill call assessment today to schedule patient's medication shipment from the [facility] [phone]).  All relevant notes have been reviewed.     Specialty medication(s) and dose(s) confirmed: Regimen is correct and unchanged.   Changes to medications: Tonya Brady reports no changes at this time.  Changes to insurance: No  New side effects reported not previously addressed with a pharmacist or physician: None reported  Questions for the pharmacist: No    Confirmed patient received a Conservation officer, historic buildings and a Surveyor, mining with first shipment. The patient will receive a drug information handout for each medication shipped and additional FDA Medication Guides as required.       DISEASE/MEDICATION-SPECIFIC INFORMATION        For patients on injectable medications: Patient currently has 0 doses left.  Next injection is scheduled for 10/20.    SPECIALTY MEDICATION ADHERENCE     Medication Adherence    Patient reported X missed doses in the last month: 0  Specialty Medication: Benlysta  Patient is on additional specialty medications: No  Patient is on more than two specialty medications: No  Any gaps in refill history greater than 2 weeks in the last 3 months: no  Demonstrates understanding of importance of adherence: yes  Informant: patient              Were doses missed due to medication being on hold? No    Benlysta 200mg /ml: Patient has 0 days of medication on hand     REFERRAL TO PHARMACIST     Referral to the pharmacist: Not needed      [facility]     Shipping address confirmed in Epic. Delivery Scheduled: Yes, Expected medication delivery date: 10/20.     Medication will be delivered via Same Day Courier to the prescription address in Epic WAM.    Tonya Brady   [facility] Specialty Technician

## 2021-03-10 MED FILL — BENLYSTA 200 MG/ML SUBCUTANEOUS AUTO-INJECTOR: SUBCUTANEOUS | 28 days supply | Qty: 4 | Fill #1

## 2021-04-01 NOTE — Unmapped (Signed)
[facility] Refill Coordination Note    Specialty Medication(s) to be Shipped:   Inflammatory Disorders: Benlysta    Other medication(s) to be shipped: No additional medications requested for fill at this time     Tonya Brady, DOB: [birth year]  Phone: [phone] (home)       All above HIPAA information was verified with patient.     Was a Nurse, learning disability used for this call? No    Completed refill call assessment today to schedule patient's medication shipment from the [facility] [phone]).  All relevant notes have been reviewed.     Specialty medication(s) and dose(s) confirmed: Regimen is correct and unchanged.   Changes to medications: Mirka reports no changes at this time.  Changes to insurance: No  New side effects reported not previously addressed with a pharmacist or physician: None reported  Questions for the pharmacist: No    Confirmed patient received a Conservation officer, historic buildings and a Surveyor, mining with first shipment. The patient will receive a drug information handout for each medication shipped and additional FDA Medication Guides as required.       DISEASE/MEDICATION-SPECIFIC INFORMATION        For patients on injectable medications: Patient currently has 1 doses left.  Next injection is scheduled for 11/17.    SPECIALTY MEDICATION ADHERENCE     Medication Adherence    Patient reported X missed doses in the last month: 0  Specialty Medication: Benlysta  Patient is on additional specialty medications: No  Patient is on more than two specialty medications: No  Any gaps in refill history greater than 2 weeks in the last 3 months: no  Demonstrates understanding of importance of adherence: yes  Informant: patient              Were doses missed due to medication being on hold? No    Benlysta 200mg /ml: Patient has 7 days of medication on hand    REFERRAL TO PHARMACIST     Referral to the pharmacist: Not needed      [facility]     Shipping address confirmed in Epic. Delivery Scheduled: Yes, Expected medication delivery date: 11/22.     Medication will be delivered via Same Day Courier to the prescription address in Epic WAM.    [name]   [facility] Specialty Technician

## 2021-04-04 DIAGNOSIS — M329 Systemic lupus erythematosus, unspecified: Principal | ICD-10-CM

## 2021-04-04 DIAGNOSIS — M3500 Sicca syndrome, unspecified: Principal | ICD-10-CM

## 2021-04-04 MED ORDER — HYDROXYCHLOROQUINE 200 MG TABLET
ORAL_TABLET | Freq: Two times a day (BID) | ORAL | 1 refills | 90.00000 days
Start: 2021-04-04 — End: 2021-10-01

## 2021-04-04 NOTE — Unmapped (Signed)
HCQ refill  Last Visit Date: 11/01/2020  Next Visit Date: 07/18/2021

## 2021-04-05 MED ORDER — HYDROXYCHLOROQUINE 200 MG TABLET
ORAL_TABLET | Freq: Two times a day (BID) | ORAL | 1 refills | 90 days | Status: CP
Start: 2021-04-05 — End: 2021-10-02

## 2021-04-12 MED FILL — BENLYSTA 200 MG/ML SUBCUTANEOUS AUTO-INJECTOR: SUBCUTANEOUS | 28 days supply | Qty: 4 | Fill #2

## 2021-04-19 ENCOUNTER — Ambulatory Visit
Admit: 2021-04-19 | Discharge: 2021-04-20 | Payer: PRIVATE HEALTH INSURANCE | Attending: Family Medicine | Primary: Family Medicine

## 2021-04-19 DIAGNOSIS — E663 Overweight: Principal | ICD-10-CM

## 2021-04-19 DIAGNOSIS — K7581 Nonalcoholic steatohepatitis (NASH): Principal | ICD-10-CM

## 2021-04-19 DIAGNOSIS — K219 Gastro-esophageal reflux disease without esophagitis: Principal | ICD-10-CM

## 2021-04-19 DIAGNOSIS — K7460 Unspecified cirrhosis of liver: Principal | ICD-10-CM

## 2021-04-19 MED ORDER — FAMOTIDINE 40 MG TABLET
ORAL_TABLET | 3 refills | 0 days | Status: CP
Start: 2021-04-19 — End: ?

## 2021-04-19 NOTE — Unmapped (Signed)
[facility] Follow Up    Assessment/Plan:     Chief Complaint   Patient presents with   ??? Weight Check       Problem List Items Addressed This Visit        Digestive    Liver cirrhosis secondary to NASH (nonalcoholic steatohepatitis) (CMS-HCC) - Primary     Following with [facility] (Dr. Sherryll Burger) and getting [place] q65mo for tumor surveillance as well as LFTs. We will continue to work on aggressive weight loss with lifestyle modifications and pharmacotherapy--See Class 2 obesity. Continue to follow Specialist closely.             Other    Overweight     [name] is a 45 y.o. 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 Her medical history includes SLE, cirrhosis, GERD, suspected OSA, depression, and emotional / stress eating. Pt wishes weight loss to prevent further liver damage.    Weight Summary:  1. Starting weight/BMI/WC: 209 lb, BMI 39.6 (06/13/19, self report), WC 39 (11/04/19, 1st measure)  2. Target weight/goal: BMI 30 or 159 lb.  3. Today's weight/BMI: 75.1 kg (165 lb 8 oz), BMI 27.54 (04/19/2021)   4. % Body weight loss: 13.4%  5. Today's   (04/19/2021) <= 38.75 (01/07/20)  6. Today's visit number: 9th  7. Duration in program: 22 mo    Referred by: [name], MD in [facility].    GOALS: 1) Continue regular exercise and reduced sugar intake     I have reviewed the patient's medical history, lifestyle history and labs/tests.   My recommendations include the following:    Eating Pattern: No more soda. Snacks on fruits. Pt is working on finding carbonated waters that she likes. Endorses healthy home cooked meals. Discussed trying 2 meals/day.   -- Will meet regularly with our nutritionist during the duration of medical management treatment. Discussed about healthier food choices and advised to home cook for less processed meals.    Physical Activity: Continues to walk outside on track. Gym 2-3x per week with weights and cardio (2-2.5 hr each session).  -- Goal of 30 minutes of aerobic exercise 5x a week or a total of 150 minutes weekly, and 30 minutes of resistance exercise 2x a week or a total of 60 min weekly.     Sleep: Currently sleeping 6 hours without sleep aids. +OSA screening (STOP-BANG 3/8). Pt declined to do sleep study for now and wishes to focus on lifestyle changes.  -- Recommend 7-8 hours of sleep at night. Advised to try melatonin 10 mg for insomnia.     Stress management: Current stressors: domestic, health, world news, finance. She has an established therapist.  -- Recommend to try relaxation techniques such as breathing techniques, listening to music to help sleep at night and yoga stretching throughout the day to help control stress.     Weight gain causing medication: None.   Hx of long term steroid use (prednisone 80 mg daily for lupus).  Hx of Ambien for insomnia years ago.    Medication: Taking Mounjaro, naltrexone, and Wellbutrin XR as directed. Tolerating well. Denies s/e. Pt is no longer able to get Mounjaro from the pharmacy due to no Dx of T2DM. Continue other medications as directed. Pt verbalized understanding and agreed to plan. Will follow.    CONTINUE Wellbutrin XL 300 mg once daily, Naltrexone 25 mg BID.    - Wellbutrin:  pt was started on this for depression before enrolling at our office. ?weight loss on it.  - Naltrexone: started on 06/13/19 at 209 lb.  Tried  - Orlistat: tried and failed.  Contraindicated  - Topamax: contraindicated dhx of kidney stone.  - Phentermine: contraindicated by hx of palpitations.  - Ozempic -- good WL, but insurance denial   - Mounjaro -- 16 lb WL, but pharmacy denial    Obesity Surgery: Pt does not meet criteria based on BMI. (Updated 04/19/21)    Medical conditions:  Glaucoma: no  Seizures: no  Medullary thyroid cancer (personal or family hx): Father and PGM both had thyroid goiter. Multiple Endocrine Neoplasia: no  Palpitations/Tachycardia: YES  Chest Pain: no past history of MI. Headaches/Migraines: YES  Nephrolithiasis: YES  H/o pancreatitis: YES, gallstone pancreatitis and she had cholecystectomy.  GERD: YES    Prior Surgeries:  Choleycystectomy: YES  Hysterectomy: YES    Obesity-associated comorbidities: Cirrhosis, GERD, suspected sleep apnea.  Current barriers include: Anxiety, depression, Sjogren's, lupus, emotional /stress eating, insomnia, fibromyalgia, pinched nerve  Birth Control Methods: N/A; s/p hysterectomy.             Return for Weight clinic F/U one slot. cirrhosis, .    I have reviewed and addressed the patient???s adherence and response to prescribed medications. I have identified patient barriers to following the proposed medication and treatment plan, and have noted opportunities to optimize healthy behaviors. I have answered the patient???s questions to satisfaction and the patient voices understanding.    Time: Greater than 50% of this encounter was spent in direct consultation with the patient in evaluation and discussing all of the above. Duration of encounter: 30 minutes.     HPI:     Tonya Brady is a 45 y.o. year old female who has a  has a past medical history of Bulging lumbar disc (2005), Cirrhosis of liver (CMS-HCC), Depression, Diabetes mellitus (CMS-HCC), Difficult intravenous access, Fibromyalgia (03/09/2013), NASH (nonalcoholic steatohepatitis), Secondary Sjogren's syndrome (CMS-HCC) (03/09/2013), Systemic lupus erythematosus (CMS-HCC) (01/19/2011), and Valvular regurgitation. who presents today for [facility] follow up.    Weight Management History    Wt Readings from Last 6 Encounters:   04/19/21 75.1 kg (165 lb 8 oz)   03/07/21 79.3 kg (174 lb 12 oz)   02/24/21 81.6 kg (180 lb)   02/24/21 81.8 kg (180 lb 4 oz)   02/17/21 82.1 kg (181 lb)   12/10/20 81 kg (178 lb 8 oz)     Waist Circumference 11/04/2019 01/07/2020 07/13/2020 12/07/2020 02/17/2021   Waist Circumference 39 38.75 35.75 36 37          Update: 16 lb loss in 2 mo. Son plays high school football! Pt will be changing from [place] to [employer] in January.   Medication: Pt has been taking Mounjaro as directed. Tolerating well. Denies s/e. Pt says she is no longer able to get Mounjaro from the pharmacy d/t no dx of T2DM.   Eating Pattern: Doing alright with food choices lately.   Physical Activity: Pt has rejoined [employer]. Her son has been helping her with exercises.   Barrier: Pt has had to start working a second FT job which she reports helps her stress    Lab Results   Component Value Date    LDL Calculated 101 (H) 03/03/2020    LDL Cholesterol, Calculated 104 06/23/2011    HDL 44 03/03/2020    HDL 35 (L) 06/23/2011    Hemoglobin  A1C 5.4 03/03/2020    Hemoglobin A1C 5.9 02/10/2016    Hemoglobin A1C 5.5 06/23/2011    Glucose 76 02/24/2021    TSH 2.500 11/10/2015    TSH 1.92 06/23/2011    AST 19 02/24/2021    AST 19 07/26/2010    ALT 16 02/24/2021    ALT 33 07/26/2010    Alkaline Phosphatase 49 02/24/2021     Past Medical/Surgical History:     Past Medical History:   Diagnosis Date   ??? Bulging lumbar disc 2005   ??? Cirrhosis of liver (CMS-HCC)    ??? Depression    ??? Diabetes mellitus (CMS-HCC)    ??? Difficult intravenous access    ??? Fibromyalgia 03/09/2013   ??? NASH (nonalcoholic steatohepatitis)    ??? Secondary Sjogren's syndrome (CMS-HCC) 03/09/2013    Secondary to SLE  Diagnosed: 12/2003 (by sx's, parotid gland enlargement, and serologies) Pathology: biopsy not done Labs: + ANA, anti-SSA, anti-SSB, RF    ??? Systemic lupus erythematosus (CMS-HCC) 01/19/2011   ??? Valvular regurgitation      Past Surgical History:   Procedure Laterality Date   ??? CARPAL TUNNEL RELEASE     ??? CESAREAN SECTION     ??? CHOLECYSTECTOMY     ??? HERNIA REPAIR     ??? HYSTERECTOMY     ??? PR REVISE MEDIAN N/CARPAL TUNNEL SURG Left 12/30/2018    Procedure: R16 NEUROPLASTY AND/OR TRANSPOSITION; MEDIAN NERVE AT CARPAL TUNNEL;  Surgeon: Theodora Blow Jacqlyn Krauss, MD;  Location: [facility] OR [facility];  Service: Orthopedics   ??? PR SALIVARY SURG UNLISTED PROC Right 12/06/2018    Procedure: R22 UNLISTED PROC SALIVARY GLANDS/DUCTS;  Surgeon: Michelle Piper, MD;  Location: [facility] OR [facility];  Service: ENT   ??? PR SALIVARY SURG UNLISTED PROC Left 02/18/2019    Procedure: R25 UNLISTED PROC SALIVARY GLANDS/DUCTS;  Surgeon: Michelle Piper, MD;  Location: [facility] OR [facility];  Service: ENT   ??? PR UPPER GI ENDOSCOPY,DIAGNOSIS N/A 05/10/2016    Procedure: UGI ENDO, INCLUDE ESOPHAGUS, STOMACH, & DUODENUM &/OR JEJUNUM; DX W/WO COLLECTION SPECIMN, BY BRUSH OR WASH;  Surgeon: Carmon Ginsberg, MD;  [facility]  Service: Gastroenterology   ??? PR UPPER GI ENDOSCOPY,DIAGNOSIS N/A 05/02/2019    Procedure: UGI ENDO, INCLUDE ESOPHAGUS, STOMACH, & DUODENUM &/OR JEJUNUM; DX W/WO COLLECTION SPECIMN, BY BRUSH OR WASH;  Surgeon: Chriss Driver, MD;  Location: GI [facility];  Service: Gastroenterology   ??? TOTAL ABDOMINAL HYSTERECTOMY     ??? TUBAL LIGATION         Social History:     Social History     Socioeconomic History   ??? Marital status: Married     Spouse name: None   ??? Number of children: None   ??? Years of education: None   ??? Highest education level: None   Tobacco Use   ??? Smoking status: Never   ??? Smokeless tobacco: Never   Vaping Use   ??? Vaping Use: Never used   Substance and Sexual Activity   ??? Alcohol use: No     Alcohol/week: 0.0 standard drinks   ??? Drug use: No   ??? Sexual activity: Not Currently     Partners: Male   Other Topics Concern   ??? Do you use sunscreen? Yes   ??? Tanning bed use? No   ??? Are you easily burned? No   ??? Excessive sun exposure? No   ??? Blistering sunburns? No  Family History:     Family History   Problem Relation Age of Onset   ??? Cancer Mother    ??? Hypertension Mother    ??? Heart disease Father    ??? Heart attack Father    ??? Diabetes Maternal Grandmother    ??? Hypertension Maternal Grandmother    ??? Thyroid disease Maternal Grandmother    ??? Breast cancer Maternal Grandmother    ??? Breast cancer Paternal Grandmother    ??? Diabetes Paternal Grandmother    ??? Thyroid disease Paternal Grandmother    ??? Depression Son    ??? Mental illness Son    ??? Breast cancer Maternal Aunt    ??? Aneurysm Maternal Aunt    ??? Diabetes Maternal Aunt    ??? Diabetes Maternal Uncle    ??? Heart attack Maternal Uncle    ??? Melanoma Neg Hx    ??? Basal cell carcinoma Neg Hx    ??? Squamous cell carcinoma Neg Hx    ??? Drug abuse Neg Hx    ??? Stroke Neg Hx        Allergies:     Aspirin, Fish containing products, Iodine, and Shellfish containing products    Current Medications:     Current Outpatient Medications   Medication Sig Dispense Refill   ??? belimumab (BENLYSTA) 200 mg/mL AtIn Inject the contents of 1 injector (200mg ) under the skin every 7 days 4 mL 2   ??? buPROPion (WELLBUTRIN XL) 300 MG 24 hr tablet Take 1 tablet (300 mg total) by mouth every morning. 60 tablet 2   ??? famotidine (PEPCID) 40 MG tablet TAKE 1 TABLET(40 MG) BY MOUTH EVERY NIGHT AS NEEDED FOR HEARTBURN 90 tablet 3   ??? fluticasone propionate (FLONASE) 50 mcg/actuation nasal spray 1 spray into each nostril daily. 16 g 11   ??? hydrOXYchloroQUINE (PLAQUENIL) 200 mg tablet Take 1 tablet (200 mg total) by mouth Two (2) times a day. 180 tablet 1   ??? ibuprofen (MOTRIN) 800 MG tablet ibuprofen 800 mg tablet     ??? melatonin 1 mg Tab tablet Take by mouth nightly as needed.      ??? naltrexone (DEPADE) 50 mg tablet Take 0.5 tablets (25 mg total) by mouth two (2) times a day. 60 tablet 2   ??? empty container Misc Use as directed 1 each 2   ??? lidocaine-menthol 4-1 % PtMd Apply topically. (Patient not taking: Reported on 04/19/2021)     ??? tirzepatide (MOUNJARO) 5 mg/0.5 mL PnIj Inject 5 mg under the skin once a week. (Patient not taking: Reported on 04/19/2021) 2 mL 1     No current facility-administered medications for this visit.       I have reviewed and (if needed) updated the patient's problem list, medications, allergies, past medical and surgical history, social and family history.    ROS:     Unless otherwise stated in the HPI:  CONSTITUTIONAL: no fever chills.   HEENT: Eyes: No diplopia or blurred vision. ENT: No earache, sore throat or runny nose.   CARDIOVASCULAR: No pressure, squeezing, strangling, tightness, heaviness or aching about the chest, neck, axilla or epigastrium.   RESPIRATORY: No cough, shortness of breath, PND or orthopnea.   GASTROINTESTINAL: No nausea, vomiting or diarrhea.   GENITOURINARY: No dysuria, frequency or urgency.   MUSCULOSKELETAL: no new pains, no joint swelling or redness.   SKIN: No change in skin, hair or nails.   NEUROLOGIC: No paresthesias, fasciculations, seizures or weakness.   PSYCHIATRIC:  No disorder of thought or mood.   ENDOCRINE: No heat or cold intolerance, polyuria or polydipsia.   HEMATOLOGICAL: No easy bruising or bleeding.    Vital Signs:     Body mass index is 27.54 kg/m??.        Wt Readings from Last 3 Encounters:   04/19/21 75.1 kg (165 lb 8 oz)   03/07/21 79.3 kg (174 lb 12 oz)   02/24/21 81.6 kg (180 lb)     Temp Readings from Last 3 Encounters:   04/19/21 36.3 ??C (97.3 ??F) (Temporal)   03/07/21 36.1 ??C (96.9 ??F) (Temporal)   02/24/21 36.9 ??C (98.4 ??F) (Oral)     BP Readings from Last 3 Encounters:   04/19/21 93/60   03/07/21 109/64   02/24/21 131/86     Pulse Readings from Last 3 Encounters:   04/19/21 60   03/07/21 62   02/24/21 66     Waist Circumference 11/04/2019 01/07/2020 07/13/2020 12/07/2020 02/17/2021   Waist Circumference 39 38.75 35.75 36 37     Physical Exam:     General: well appearing, in NAD, Body mass index is 27.54 kg/m??. Ambulatory without help.  Body fat distribution: central & gluteofemoral adiposity. No supraclavicular adiposity. No dorsal adiposity.       Head: normocephalic atraumatic.  Eyes: PERRLA, EOMI, Sclera WNL.   Oral pharynx: moist, no exudate, no erythema, not enlarged. Good dental hygiene. Moderate OP crowding.  Neck: supple, no LAD, no thyromegaly, no bruit.  CV: RRR no rubs or murmurs. Peripheral edema: none.  Lungs: clear bilaterally to ascultation. No wheezing.  Abdomen: soft, NT/ND. No intertrigo. Moderate pannus. No hepatomegaly.   Extremities: no clubbing, cyanosis, No edema.  Skin: no concerning lesions, rashes observed. No lipomas, no acanthosis nigricans.  Neuro: Alert and oriented X 3.    Labs:     No visits with results within 1 Month(s) from this visit.   Latest known visit with results is:   Admission on 02/24/2021, Discharged on 02/24/2021   Component Date Value Ref Range Status   ??? Sodium 02/24/2021 144  135 - 145 mmol/L Final   ??? Potassium 02/24/2021 4.1  3.4 - 4.8 mmol/L Final   ??? Chloride 02/24/2021 109 (H)  98 - 107 mmol/L Final   ??? CO2 02/24/2021 27.1  20.0 - 31.0 mmol/L Final   ??? Anion Gap 02/24/2021 8  5 - 14 mmol/L Final   ??? BUN 02/24/2021 9  9 - 23 mg/dL Final   ??? Creatinine 02/24/2021 0.78  0.60 - 0.80 mg/dL Final   ??? BUN/Creatinine Ratio 02/24/2021 12   Final   ??? eGFR CKD-EPI (2021) Female 02/24/2021 >90  >=60 mL/min/1.79m2 Final    eGFR calculated with CKD-EPI 2021 equation in accordance with [employer] and [employer] of Nephrology Task Force recommendations.   ??? Glucose 02/24/2021 76  70 - 179 mg/dL Final   ??? Calcium 66/44/0347 8.8  8.7 - 10.4 mg/dL Final   ??? Albumin 42/59/5638 3.7  3.4 - 5.0 g/dL Final   ??? Total Protein 02/24/2021 6.6  5.7 - 8.2 g/dL Final   ??? Total Bilirubin 02/24/2021 0.4  0.3 - 1.2 mg/dL Final   ??? AST 75/64/3329 19  <=34 U/L Final   ??? ALT 02/24/2021 16  10 - 49 U/L Final   ??? Alkaline Phosphatase 02/24/2021 49  46 - 116 U/L Final   ??? Color, UA 02/24/2021 Yellow   Final   ??? Clarity, UA 02/24/2021 Hazy  Final   ??? Specific Gravity, UA 02/24/2021 1.025  1.005 - 1.040 Final   ??? pH, UA 02/24/2021 5.5  5.0 - 9.0 Final   ??? Leukocyte Esterase, UA 02/24/2021 Small (A)  Negative Final   ??? Nitrite, UA 02/24/2021 Positive (A)  Negative Final   ??? Protein, UA 02/24/2021 100 mg/dL (A)  Negative Final   ??? Glucose, UA 02/24/2021 Negative  Negative Final   ??? Ketones, UA 02/24/2021 Negative  Negative Final   ??? Urobilinogen, UA 02/24/2021 0.2 mg/dL   Final   ??? Bilirubin, UA 02/24/2021 Negative  Negative Final   ??? Blood, UA 02/24/2021 Moderate (A)  Negative Final   ??? RBC, UA 02/24/2021 10 (H)  <4 /HPF Final   ??? WBC, UA 02/24/2021 >100 (H)  0 - 5 /HPF Final   ??? Squam Epithel, UA 02/24/2021 2  0 - 5 /HPF Final   ??? Bacteria, UA 02/24/2021 Many (A)  None Seen /HPF Final   ??? WBC 02/24/2021 5.3  3.6 - 11.2 10*9/L Final   ??? RBC 02/24/2021 4.43  3.95 - 5.13 10*12/L Final   ??? HGB 02/24/2021 13.4  11.3 - 14.9 g/dL Final   ??? HCT 16/02/9603 40.3  34.0 - 44.0 % Final   ??? MCV 02/24/2021 91.0  77.6 - 95.7 fL Final   ??? MCH 02/24/2021 30.3  25.9 - 32.4 pg Final   ??? MCHC 02/24/2021 33.3  32.0 - 36.0 g/dL Final   ??? RDW 54/01/8118 12.9  12.2 - 15.2 % Final   ??? MPV 02/24/2021 9.7  6.8 - 10.7 fL Final   ??? Platelet 02/24/2021 152  150 - 450 10*9/L Final   ??? nRBC 02/24/2021 0  <=4 /100 WBCs Final   ??? Neutrophils % 02/24/2021 76.1  % Final   ??? Lymphocytes % 02/24/2021 12.9  % Final   ??? Monocytes % 02/24/2021 9.6  % Final   ??? Eosinophils % 02/24/2021 1.1  % Final   ??? Basophils % 02/24/2021 0.3  % Final   ??? Absolute Neutrophils 02/24/2021 4.0  1.8 - 7.8 10*9/L Final   ??? Absolute Lymphocytes 02/24/2021 0.7 (L)  1.1 - 3.6 10*9/L Final   ??? Absolute Monocytes 02/24/2021 0.5  0.3 - 0.8 10*9/L Final   ??? Absolute Eosinophils 02/24/2021 0.1  0.0 - 0.5 10*9/L Final   ??? Absolute Basophils 02/24/2021 0.0  0.0 - 0.1 10*9/L Final   ??? Urine Culture, Comprehensive 02/24/2021 >100,000 CFU/mL Klebsiella pneumoniae (A)   Final       Follow-up:     Recommend well adult check/physical in 1 year. Otherwise, follow up as below.  Return for Weight clinic F/U one slot. cirrhosis, .    Health maintenance reviewed and recommendations made based on [place] Preventative Task Force (USPTF) recommendations. Reviewed appropriate diet and exercise. Patient stated understanding and there were no barriers to learning.     I attest that I, Constance Goltz, personally documented this note while acting as scribe for Marshell Garfinkel, MD.      Constance Goltz, Scribe.  04/19/2021     The documentation recorded by the scribe accurately reflects the service I personally performed and the decisions made by me.    Marshell Garfinkel, MD

## 2021-04-19 NOTE — Unmapped (Addendum)
Tonya Brady is a 45 y.o. 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 Her medical history includes SLE, cirrhosis, GERD, suspected OSA, depression, and emotional / stress eating. Pt wishes weight loss to prevent further liver damage.    Weight Summary:  1. Starting weight/BMI/WC: 209 lb, BMI 39.6 (06/13/19, self report), WC 39 (11/04/19, 1st measure)  2. Target weight/goal: BMI 30 or 159 lb.  3. Today's weight/BMI: 75.1 kg (165 lb 8 oz), BMI 27.54 (04/19/2021)   4. % Body weight loss: 13.4%  5. Today's   (04/19/2021) <= 38.75 (01/07/20)  6. Today's visit number: 9th  7. Duration in program: 22 mo    Referred by: [name], MD in [facility].    GOALS: 1) Continue regular exercise and reduced sugar intake     I have reviewed the patient's medical history, lifestyle history and labs/tests.   My recommendations include the following:    Eating Pattern: No more soda. Snacks on fruits. Pt is working on finding carbonated waters that she likes. Endorses healthy home cooked meals. Discussed trying 2 meals/day.   -- Will meet regularly with our nutritionist during the duration of medical management treatment. Discussed about healthier food choices and advised to home cook for less processed meals.    Physical Activity: Continues to walk outside on track. Gym 2-3x per week with weights and cardio (2-2.5 hr each session).  -- Goal of 30 minutes of aerobic exercise 5x a week or a total of 150 minutes weekly, and 30 minutes of resistance exercise 2x a week or a total of 60 min weekly.     Sleep: Currently sleeping 6 hours without sleep aids. +OSA screening (STOP-BANG 3/8). Pt declined to do sleep study for now and wishes to focus on lifestyle changes.  -- Recommend 7-8 hours of sleep at night. Advised to try melatonin 10 mg for insomnia.     Stress management: Current stressors: domestic, health, world news, finance. She has an established therapist.  -- Recommend to try relaxation techniques such as breathing techniques, listening to music to help sleep at night and yoga stretching throughout the day to help control stress.     Weight gain causing medication: None.   Hx of long term steroid use (prednisone 80 mg daily for lupus).  Hx of Ambien for insomnia years ago.    Medication: Taking Mounjaro, naltrexone, and Wellbutrin XR as directed. Tolerating well. Denies s/e. Pt is no longer able to get Mounjaro from the pharmacy due to no Dx of T2DM. Continue other medications as directed. Pt verbalized understanding and agreed to plan. Will follow.    CONTINUE Wellbutrin XL 300 mg once daily, Naltrexone 25 mg BID.    - Wellbutrin: pt was started on this for depression before enrolling at our office. ?weight loss on it.  - Naltrexone: started on 06/13/19 at 209 lb.  Tried  - Orlistat: tried and failed.  Contraindicated  - Topamax: contraindicated dhx of kidney stone.  - Phentermine: contraindicated by hx of palpitations.  - Ozempic -- good WL, but insurance denial   - Mounjaro -- 16 lb WL, but pharmacy denial    Obesity Surgery: Pt does not meet criteria based on BMI. (Updated 04/19/21)    Medical conditions:  Glaucoma: no  Seizures: no  Medullary thyroid cancer (personal or family hx): Father and PGM both had thyroid goiter. Multiple Endocrine Neoplasia: no  Palpitations/Tachycardia: YES  Chest Pain: no  past history of MI.   Headaches/Migraines: YES  Nephrolithiasis: YES  H/o pancreatitis: YES, gallstone pancreatitis and she had cholecystectomy.  GERD: YES    Prior Surgeries:  Choleycystectomy: YES  Hysterectomy: YES    Obesity-associated comorbidities: Cirrhosis, GERD, suspected sleep apnea.  Current barriers include: Anxiety, depression, Sjogren's, lupus, emotional /stress eating, insomnia, fibromyalgia, pinched nerve  Birth Control Methods: N/A; s/p hysterectomy.

## 2021-04-19 NOTE — Unmapped (Signed)
Following with [name] (Dr. Sherryll Burger) and getting [place] q73mo for tumor surveillance as well as LFTs. We will continue to work on aggressive weight loss with lifestyle modifications and pharmacotherapy--See Class 2 obesity. Continue to follow Specialist closely.

## 2021-04-26 ENCOUNTER — Encounter
Admit: 2021-04-26 | Discharge: 2021-04-26 | Payer: PRIVATE HEALTH INSURANCE | Attending: Registered Nurse | Primary: Registered Nurse

## 2021-04-26 ENCOUNTER — Ambulatory Visit: Admit: 2021-04-26 | Discharge: 2021-04-26 | Payer: PRIVATE HEALTH INSURANCE

## 2021-04-26 MED ADMIN — lidocaine (XYLOCAINE) 20 mg/mL (2 %) injection: INTRAVENOUS | @ 13:00:00 | Stop: 2021-04-26

## 2021-04-26 MED ADMIN — propofol (DIPRIVAN) infusion 10 mg/mL: INTRAVENOUS | @ 13:00:00 | Stop: 2021-04-26

## 2021-04-26 MED ADMIN — fentaNYL (PF) (SUBLIMAZE) injection: INTRAVENOUS | @ 13:00:00 | Stop: 2021-04-26

## 2021-04-26 MED ADMIN — glycopyrrolate (ROBINUL) injection: INTRAVENOUS | @ 13:00:00 | Stop: 2021-04-26

## 2021-04-26 MED ADMIN — lactated Ringers infusion: 10 mL/h | INTRAVENOUS | @ 13:00:00 | Stop: 2021-04-26

## 2021-04-26 MED ADMIN — propofoL (DIPRIVAN) injection: INTRAVENOUS | @ 13:00:00 | Stop: 2021-04-26

## 2021-05-03 NOTE — Unmapped (Signed)
[employer] prior authorization for EGD scanned to chart

## 2021-05-04 DIAGNOSIS — M329 Systemic lupus erythematosus, unspecified: Principal | ICD-10-CM

## 2021-05-04 MED ORDER — BENLYSTA 200 MG/ML SUBCUTANEOUS AUTO-INJECTOR
SUBCUTANEOUS | 2 refills | 28 days
Start: 2021-05-04 — End: ?

## 2021-05-05 MED ORDER — BENLYSTA 200 MG/ML SUBCUTANEOUS AUTO-INJECTOR
SUBCUTANEOUS | 2 refills | 28 days | Status: CP
Start: 2021-05-05 — End: ?
  Filled 2021-05-10: qty 4, 28d supply, fill #0

## 2021-05-05 NOTE — Unmapped (Signed)
07/18/2021 next visit; refill request  Lab Results   Component Value Date    WBC 5.3 02/24/2021    HGB 13.4 02/24/2021    HCT 40.3 02/24/2021    PLT 152 02/24/2021       Lab Results   Component Value Date    NA 144 02/24/2021    K 4.1 02/24/2021    CL 109 (H) 02/24/2021    CO2 27.1 02/24/2021    BUN 9 02/24/2021    CREATININE 0.78 02/24/2021    GLU 76 02/24/2021    CALCIUM 8.8 02/24/2021    MG 1.8 11/10/2015       Lab Results   Component Value Date    BILITOT 0.4 02/24/2021    BILIDIR 0.10 12/10/2020    PROT 6.6 02/24/2021    ALBUMIN 3.7 02/24/2021    ALT 16 02/24/2021    AST 19 02/24/2021    ALKPHOS 49 02/24/2021    GGT 22 03/07/2019       Lab Results   Component Value Date    PT 14.0 (H) 12/10/2020    INR 1.20 12/10/2020    APTT 33.2 01/31/2016

## 2021-05-05 NOTE — Unmapped (Signed)
[facility] Refill Coordination Note    Specialty Medication(s) to be Shipped:   Inflammatory Disorders: Benlysta    Other medication(s) to be shipped: No additional medications requested for fill at this time     Tonya Brady, DOB: 02/08/1976  Phone: [phone] (home)       All above HIPAA information was verified with patient.     Was a Nurse, learning disability used for this call? No    Completed refill call assessment today to schedule patient's medication shipment from the [facility] [phone]).  All relevant notes have been reviewed.     Specialty medication(s) and dose(s) confirmed: Regimen is correct and unchanged.   Changes to medications: Malai reports no changes at this time.  Changes to insurance: No  New side effects reported not previously addressed with a pharmacist or physician: None reported  Questions for the pharmacist: No    Confirmed patient received a Conservation officer, historic buildings and a Surveyor, mining with first shipment. The patient will receive a drug information handout for each medication shipped and additional FDA Medication Guides as required.       DISEASE/MEDICATION-SPECIFIC INFORMATION        For patients on injectable medications: Patient currently has 1 doses left.  Next injection is scheduled for 12/15.    SPECIALTY MEDICATION ADHERENCE     Medication Adherence    Patient reported X missed doses in the last month: 0  Specialty Medication: Benlysta  Patient is on additional specialty medications: No  Patient is on more than two specialty medications: No  Any gaps in refill history greater than 2 weeks in the last 3 months: no  Demonstrates understanding of importance of adherence: yes  Informant: patient              Were doses missed due to medication being on hold? No    Benlysta 200mg /ml: Patient has 7 days of medication on hand    REFERRAL TO PHARMACIST     Referral to the pharmacist: Not needed      [facility]     Shipping address confirmed in Epic. Delivery Scheduled: Yes, Expected medication delivery date: 12/20.     Medication will be delivered via Same Day Courier to the prescription address in Epic WAM.    [name]   [facility] Specialty Technician

## 2021-05-18 MED ORDER — BUPROPION HCL XL 300 MG 24 HR TABLET, EXTENDED RELEASE
ORAL_TABLET | Freq: Every morning | ORAL | 2 refills | 60 days | Status: CP
Start: 2021-05-18 — End: 2022-05-18

## 2021-05-18 NOTE — Unmapped (Signed)
Patient is requesting the following refill  Requested Prescriptions      No prescriptions requested or ordered in this encounter       Recent Visits  Date Type Provider Dept   04/19/21 Office Visit Riley Kill, MD [facility] [address] [facility]   03/07/21 Office Visit Alexandria Lodge, MD [facility] [address] [facility]   02/24/21 Office Visit Mateo Flow, MD [facility] [address] [facility]   02/17/21 Office Visit Maralyn Sago Elon Jester, MD [facility] [address] [facility]   12/07/20 Office Visit Maralyn Sago Elon Jester, MD [facility] [address] [facility]   07/13/20 Office Visit Maralyn Sago Elon Jester, MD [facility] [address] [place]   Showing recent visits within past 365 days with a meds authorizing provider and meeting all other requirements  Future Appointments  Date Type Provider Dept   07/19/21 Appointment Riley Kill, MD [facility] [address] [place]   03/08/22 Appointment Alexandria Lodge, MD [facility] [address] [place]   Showing future appointments within next 365 days with a meds authorizing provider and meeting all other requirements       Labs: Not applicable this refill

## 2021-05-19 MED ORDER — NALTREXONE 50 MG TABLET
ORAL_TABLET | Freq: Two times a day (BID) | ORAL | 2 refills | 60 days | Status: CP
Start: 2021-05-19 — End: ?

## 2021-05-19 MED ORDER — BUPROPION HCL SR 150 MG TABLET,12 HR SUSTAINED-RELEASE
ORAL_TABLET | 1 refills | 0.00000 days
Start: 2021-05-19 — End: ?

## 2021-05-19 NOTE — Unmapped (Signed)
Patient is requesting the following refill  Requested Prescriptions     Refused Prescriptions Disp Refills   ??? buPROPion (WELLBUTRIN SR) 150 MG 12 hr tablet [Pharmacy Med Name: BUPROPION SR 150MG  TABLETS (12 H)] 120 tablet 1     Sig: TAKE 1 TABLET(150 MG) BY MOUTH TWICE DAILY     Refused By: Marylyn Ishihara     Reason for Refusal: Request already responded to by other means (e.g. phone or fax)       Recent Visits  Date Type Provider Dept   04/19/21 Office Visit Riley Kill, MD [facility] [address] [facility]   03/07/21 Office Visit Alexandria Lodge, MD [facility] [address] [facility]   02/24/21 Office Visit Mateo Flow, MD [facility] [address] [facility]   02/17/21 Office Visit Maralyn Sago Elon Jester, MD [facility] [address] [facility]   12/07/20 Office Visit Maralyn Sago Elon Jester, MD [facility] [address] 47 [facility]   07/13/20 Office Visit Maralyn Sago Elon Jester, MD [facility] [address] [place]   Showing recent visits within past 365 days with a meds authorizing provider and meeting all other requirements  Future Appointments  Date Type Provider Dept   07/19/21 Appointment Riley Kill, MD [facility] [address] [place]   03/08/22 Appointment Alexandria Lodge, MD [facility] [address] [place]   Showing future appointments within next 365 days with a meds authorizing provider and meeting all other requirements       Labs: Not applicable this refill

## 2021-05-24 NOTE — Unmapped (Signed)
1st attempt to contact patient to schedule appointment with Dr. Sherryll Burger and a U/S.  Spoke with patient she verified her name and DOB patient also confirmed with me today a appointment on 2/20@10 :30am for a U/S and 4/26@8 :30am with Dr. Sherryll Burger.

## 2021-05-27 NOTE — Unmapped (Signed)
Called and was able to inform the pt that the requested medication has been sent to the pharmacy on and file and should be available for pickup.

## 2021-06-01 NOTE — Unmapped (Signed)
[facility] Refill Coordination Note    Specialty Medication(s) to be Shipped:   Inflammatory Disorders: Benlysta    Other medication(s) to be shipped: No additional medications requested for fill at this time     Tonya Brady, DOB: 07/05/1975  Phone: [phone] (home)       All above HIPAA information was verified with patient.     Was a Nurse, learning disability used for this call? No    Completed refill call assessment today to schedule patient's medication shipment from the [facility] [phone]).  All relevant notes have been reviewed.     Specialty medication(s) and dose(s) confirmed: Regimen is correct and unchanged.   Changes to medications: Tonya Brady reports no changes at this time.  Changes to insurance: No  New side effects reported not previously addressed with a pharmacist or physician: None reported  Questions for the pharmacist: No    Confirmed patient received a Conservation officer, historic buildings and a Surveyor, mining with first shipment. The patient will receive a drug information handout for each medication shipped and additional FDA Medication Guides as required.       DISEASE/MEDICATION-SPECIFIC INFORMATION        For patients on injectable medications: Patient currently has 1 doses left.  Next injection is scheduled for 1/12.    SPECIALTY MEDICATION ADHERENCE     Medication Adherence    Patient reported X missed doses in the last month: 0  Specialty Medication: Benlysta  Patient is on additional specialty medications: No  Patient is on more than two specialty medications: No  Any gaps in refill history greater than 2 weeks in the last 3 months: no  Demonstrates understanding of importance of adherence: yes  Informant: patient              Were doses missed due to medication being on hold? No    Benlysta 200mg /ml: Patient has 7 days of medication on hand    REFERRAL TO PHARMACIST     Referral to the pharmacist: Not needed      [facility]     Shipping address confirmed in Epic. Delivery Scheduled: Yes, Expected medication delivery date: 1/17.     Medication will be delivered via Same Day Courier to the prescription address in Epic WAM.    Tonya Brady   [facility] Specialty Technician

## 2021-06-06 DIAGNOSIS — M329 Systemic lupus erythematosus, unspecified: Principal | ICD-10-CM

## 2021-06-07 NOTE — Unmapped (Signed)
Tonya Brady 's Benlysta shipment will be delayed as a result of prior authorization being required by the patient's insurance.     I have reached out to the patient  at [phone] and communicated the delay. We will call the patient back to reschedule the delivery upon resolution. We have not confirmed the new delivery date.

## 2021-06-08 NOTE — Unmapped (Signed)
Tonya Brady 's Benlysta shipment will be sent out  as a result of prior authorization now approved.     I have reached out to the patient  at [phone] and communicated the delivery change. We will reschedule the medication for the delivery date that the patient agreed upon.  We have confirmed the delivery date as 1/19, via same day courier.

## 2021-06-09 MED FILL — BENLYSTA 200 MG/ML SUBCUTANEOUS AUTO-INJECTOR: SUBCUTANEOUS | 28 days supply | Qty: 4 | Fill #1

## 2021-06-29 NOTE — Unmapped (Signed)
[facility] Clinical Assessment & Refill Coordination Note    Tonya Brady, DOB: [birth year]  Phone: [phone] (home)     All above HIPAA information was verified with patient.     Was a Nurse, learning disability used for this call? No    Specialty Medication(s):   Inflammatory Disorders: Benlysta     Current Outpatient Medications   Medication Sig Dispense Refill   ??? belimumab (BENLYSTA) 200 mg/mL AtIn Inject the contents of 1 injector (200mg ) under the skin every 7 days 4 mL 2   ??? buPROPion (WELLBUTRIN XL) 300 MG 24 hr tablet Take 1 tablet (300 mg total) by mouth every morning. 60 tablet 2   ??? empty container Misc Use as directed 1 each 2   ??? famotidine (PEPCID) 40 MG tablet TAKE 1 TABLET(40 MG) BY MOUTH EVERY NIGHT AS NEEDED FOR HEARTBURN 90 tablet 3   ??? fluticasone propionate (FLONASE) 50 mcg/actuation nasal spray 1 spray into each nostril daily. 16 g 11   ??? hydrOXYchloroQUINE (PLAQUENIL) 200 mg tablet Take 1 tablet (200 mg total) by mouth Two (2) times a day. 180 tablet 1   ??? ibuprofen (MOTRIN) 800 MG tablet ibuprofen 800 mg tablet     ??? lidocaine-menthol 4-1 % PtMd Apply topically. (Patient not taking: Reported on 04/19/2021)     ??? melatonin 1 mg Tab tablet Take by mouth nightly as needed.  (Patient not taking: Reported on 04/26/2021)     ??? naltrexone (DEPADE) 50 mg tablet Take 0.5 tablets (25 mg total) by mouth two (2) times a day. 60 tablet 2   ??? tirzepatide (MOUNJARO) 5 mg/0.5 mL PnIj Inject 5 mg under the skin once a week. (Patient not taking: Reported on 04/19/2021) 2 mL 1     No current facility-administered medications for this visit.        Changes to medications: Nyriah reports no changes at this time.    Allergies   Allergen Reactions   ??? Aspirin Swelling     Other reaction(s): SWELLING/EDEMA   ??? Fish Containing Products Diarrhea and Nausea And Vomiting   ??? Iodine Other (See Comments)     Pt told not to have d/t shellfish allergy   ??? Shellfish Containing Products Hives, Diarrhea and Nausea Only     Other reaction(s): HIVES  Other reaction(s): HIVES  Other reaction(s): NAUSEA       Changes to allergies: No    SPECIALTY MEDICATION ADHERENCE       Medication Adherence    Patient reported X missed doses in the last month: 0  Specialty Medication: Benlysta q7d  Patient is on additional specialty medications: No  Informant: patient          Specialty medication(s) dose(s) confirmed: Regimen is correct and unchanged.     Are there any concerns with adherence? No    Adherence counseling provided? Not needed    CLINICAL MANAGEMENT AND INTERVENTION      Clinical Benefit Assessment:    Do you feel the medicine is effective or helping your condition? Yes    Clinical Benefit counseling provided? Not needed    Adverse Effects Assessment:    Are you experiencing any side effects? No    Are you experiencing difficulty administering your medicine? No    Quality of Life Assessment:    Quality of Life    Rheumatology  Oncology  Dermatology  Cystic Fibrosis          How many days over the  past month did your SLE  keep you from your normal activities? For example, brushing your teeth or getting up in the morning. had some swelling in joints/muscles.   Usually happens in Jan/Feb but has been better since starting Benlysta    Have you discussed this with your provider? Not needed    Acute Infection Status:    Acute infections noted within Epic:  No active infections  Patient reported infection: None    Therapy Appropriateness:    Is therapy appropriate and patient progressing towards therapeutic goals? Yes, therapy is appropriate and should be continued    DISEASE/MEDICATION-SPECIFIC INFORMATION      For patients on injectable medications: Patient currently has 1 doses left.  Next injection is scheduled for 2/9.    PATIENT SPECIFIC NEEDS     - Does the patient have any physical, cognitive, or cultural barriers? No    - Is the patient high risk? No    - Does the patient require a Care Management Plan? No SOCIAL DETERMINANTS OF HEALTH     At the [facility], we have learned that life circumstances - like trouble affording food, housing, utilities, or transportation can affect the health of many of our patients.   That is why we wanted to ask: are you currently experiencing any life circumstances that are negatively impacting your health and/or quality of life? Patient declined to answer    Social Determinants of Health     Food Insecurity: Not on file   Tobacco Use: Low Risk    ??? Smoking Tobacco Use: Never   ??? Smokeless Tobacco Use: Never   ??? Passive Exposure: Not on file   Transportation Needs: Not on file   Alcohol Use: Not on file   Housing/Utilities: Not on file   Substance Use: Not on file   Financial Resource Strain: Not on file   Physical Activity: Not on file   Health Literacy: Low Risk    ??? : Never   Stress: Not on file   Intimate Partner Violence: Not on file   Depression: Not at risk   ??? PHQ-2 Score: 0   Social Connections: Not on file       Would you be willing to receive help with any of the needs that you have identified today? Not applicable       SHIPPING     Specialty Medication(s) to be Shipped:   Inflammatory Disorders: Benlysta    Other medication(s) to be shipped: No additional medications requested for fill at this time     Changes to insurance: No    Delivery Scheduled: Yes, Expected medication delivery date: 2/13.     Medication will be delivered via Same Day Courier to the confirmed prescription address in [facility].    The patient will receive a drug information handout for each medication shipped and additional FDA Medication Guides as required.  Verified that patient has previously received a Conservation officer, historic buildings and a Surveyor, mining.    The patient or caregiver noted above participated in the development of this care plan and knows that they can request review of or adjustments to the care plan at any time.      All of the patient's questions and concerns have been addressed.    Julianne Rice   [facility] Specialty Pharmacist

## 2021-07-04 MED FILL — BENLYSTA 200 MG/ML SUBCUTANEOUS AUTO-INJECTOR: SUBCUTANEOUS | 28 days supply | Qty: 4 | Fill #2

## 2021-07-11 ENCOUNTER — Ambulatory Visit: Admit: 2021-07-11 | Discharge: 2021-07-12 | Payer: PRIVATE HEALTH INSURANCE

## 2021-07-11 DIAGNOSIS — K7460 Unspecified cirrhosis of liver: Principal | ICD-10-CM

## 2021-07-11 DIAGNOSIS — K7581 Nonalcoholic steatohepatitis (NASH): Principal | ICD-10-CM

## 2021-07-18 ENCOUNTER — Ambulatory Visit: Admit: 2021-07-18 | Discharge: 2021-07-19 | Payer: PRIVATE HEALTH INSURANCE

## 2021-07-18 DIAGNOSIS — M3500 Sicca syndrome, unspecified: Principal | ICD-10-CM

## 2021-07-18 DIAGNOSIS — M329 Systemic lupus erythematosus, unspecified: Principal | ICD-10-CM

## 2021-07-18 LAB — CBC W/ AUTO DIFF
BASOPHILS ABSOLUTE COUNT: 0 10*9/L (ref 0.0–0.1)
BASOPHILS RELATIVE PERCENT: 0.4 %
EOSINOPHILS ABSOLUTE COUNT: 0.1 10*9/L (ref 0.0–0.5)
EOSINOPHILS RELATIVE PERCENT: 1.8 %
HEMATOCRIT: 38.4 % (ref 34.0–44.0)
HEMOGLOBIN: 12.7 g/dL (ref 11.3–14.9)
LYMPHOCYTES ABSOLUTE COUNT: 0.7 10*9/L — ABNORMAL LOW (ref 1.1–3.6)
LYMPHOCYTES RELATIVE PERCENT: 26 %
MEAN CORPUSCULAR HEMOGLOBIN CONC: 33.1 g/dL (ref 32.0–36.0)
MEAN CORPUSCULAR HEMOGLOBIN: 30.7 pg (ref 25.9–32.4)
MEAN CORPUSCULAR VOLUME: 92.9 fL (ref 77.6–95.7)
MEAN PLATELET VOLUME: 10.3 fL (ref 6.8–10.7)
MONOCYTES ABSOLUTE COUNT: 0.5 10*9/L (ref 0.3–0.8)
MONOCYTES RELATIVE PERCENT: 15.8 %
NEUTROPHILS ABSOLUTE COUNT: 1.6 10*9/L — ABNORMAL LOW (ref 1.8–7.8)
NEUTROPHILS RELATIVE PERCENT: 56 %
PLATELET COUNT: 134 10*9/L — ABNORMAL LOW (ref 150–450)
RED BLOOD CELL COUNT: 4.14 10*12/L (ref 3.95–5.13)
RED CELL DISTRIBUTION WIDTH: 12.9 % (ref 12.2–15.2)
WBC ADJUSTED: 2.8 10*9/L — ABNORMAL LOW (ref 3.6–11.2)

## 2021-07-18 LAB — URINALYSIS WITH MICROSCOPY WITH CULTURE REFLEX
BILIRUBIN UA: NEGATIVE
BLOOD UA: NEGATIVE
GLUCOSE UA: NEGATIVE
KETONES UA: NEGATIVE
LEUKOCYTE ESTERASE UA: NEGATIVE
NITRITE UA: NEGATIVE
PH UA: 5.5 (ref 5.0–9.0)
PROTEIN UA: NEGATIVE
RBC UA: <1 /HPF (ref 0–3)
SPECIFIC GRAVITY UA: <=1.005 (ref 1.005–1.030)
SQUAMOUS EPITHELIAL: 1 /HPF (ref 0–5)
UROBILINOGEN UA: 0.2
WBC UA: <1 /HPF (ref 0–3)

## 2021-07-18 LAB — COMPREHENSIVE METABOLIC PANEL
ALBUMIN: 3.8 g/dL (ref 3.4–5.0)
ALKALINE PHOSPHATASE: 42 U/L — ABNORMAL LOW (ref 46–116)
ALT (SGPT): 12 U/L (ref 10–49)
ANION GAP: 6 mmol/L (ref 5–14)
AST (SGOT): 16 U/L (ref <=34)
BILIRUBIN TOTAL: 0.2 mg/dL — ABNORMAL LOW (ref 0.3–1.2)
BLOOD UREA NITROGEN: 10 mg/dL (ref 9–23)
BUN / CREAT RATIO: 16
CALCIUM: 9.2 mg/dL (ref 8.7–10.4)
CHLORIDE: 113 mmol/L — ABNORMAL HIGH (ref 98–107)
CO2: 24.2 mmol/L (ref 20.0–31.0)
CREATININE: 0.64 mg/dL
EGFR CKD-EPI (2021) FEMALE: >90 mL/min/{1.73_m2} (ref >=60)
GLUCOSE RANDOM: 93 mg/dL (ref 70–179)
POTASSIUM: 4.2 mmol/L (ref 3.4–4.8)
PROTEIN TOTAL: 7.1 g/dL (ref 5.7–8.2)
SODIUM: 143 mmol/L (ref 135–145)

## 2021-07-18 LAB — PROTEIN / CREATININE RATIO, URINE
CREATININE, URINE: 11.2 mg/dL
PROTEIN URINE: <6 mg/dL

## 2021-07-18 LAB — C4 COMPLEMENT: C4 COMPLEMENT: 26.1 mg/dL (ref 12.0–36.0)

## 2021-07-18 LAB — C3 COMPLEMENT: C3 COMPLEMENT: 81 mg/dL — ABNORMAL LOW (ref 90–170)

## 2021-07-18 MED ORDER — NALTREXONE 50 MG TABLET
ORAL_TABLET | Freq: Two times a day (BID) | ORAL | 2 refills | 60.00000 days | Status: CP
Start: 2021-07-18 — End: ?

## 2021-07-18 MED ORDER — HYDROXYCHLOROQUINE 200 MG TABLET
ORAL_TABLET | Freq: Two times a day (BID) | ORAL | 1 refills | 90 days | Status: CP
Start: 2021-07-18 — End: 2022-01-14

## 2021-07-18 NOTE — Unmapped (Addendum)
Dear Tonya Brady    It was nice to see you!     Today in clinic we discussed the following:     Lupus monitoring labs today  Continue Plaquenil and Benlysta  Let me know if your joint pain, swelling, or stiffness worsens     Please know you can reach out to me if you have any issues getting a medication (availability or cost) that I prescribe.    MyChart messages may take up to 48-72 hours for response and should not be used in an emergency.     Have a nice day!     Eliezer Lofts DO MPH  [facility]  [address]  [place], [place] [address]  Phone: [phone]  Fax: [phone]

## 2021-07-18 NOTE — Unmapped (Signed)
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 Treated with HCQ, prior azathioprine (discontinued 2/2 nausea), MTX (discontinued 2/2 GI effects/NAFLD). Initiated belimumab 2020 for MSK pain. Since last visit, she reports a malar facial rash and joint pain, however improving. She feels that since starting belimumab, her flares of joint pain and rash are less frequent.  - SLE monitoring labs  - Continue HCQ 200mg  BID and subcutaneous belimumab weekly  - If she develops parotid flare of parotid gland swelling, will refer for repeat sialendoscopy as this was very helpful in the past    Lateral epicondylitis  Completed PT for this in the past. Symptoms are stable.    Healthcare maintenance  - Vaccinations: UTD covid and bivalent vaccination  - HCQ: eye exam Jan 2022 reportedly normal. Overdue and encouraged to shedule this month     Return in about 6 months (around 01/15/2022).    This patient was seen and discussed with Dr. Delton See who agrees with the plan outlined above.    Eliezer Lofts DO MPH  Rheumatology Fellow, PGY4    Subjective:   Primary Care Provider: Alexandria Lodge, MD    HPI:  Tonya Brady is a 46 y.o.  female with a PMH of SLE and secondary Sjogrens, history of trigger finger, NASH cirrhosis, GERD, anxiety presenting for follow-up. Last seen in clinic 10/2020 at which time she was clinically stable on HCQ and belimumab.    She has been doing well overall well since her last visit. She reports malar rash and hyperpigmentation on the side of her face, which happens around this time of the year. She has joint pain in her MCPs with some swelling. She has stiffness which lasts 30 minutes in the morning before she gets going. She has no trouble making a fist. She denies problems with grip strength. She has occasional knee pain after activity, such as walking a lot during the day, walking up the stairs, or bending down on her knees. Her job involves cleaning homes. Her right elbow sometimes bothers her too. This pain is worse with activity and better with rest. She takes ibuprofen daily, 800 mg twice per day which helps her pain. She denies nasal/oral ulcers or patchy alopecia. She underwent sialendoscopy previously for sjogrens. She still has dry eyes and dry mouth but denies parotid gland swelling.    She reports adherence with HCQ 200 mg BID and belimumab subcutaneous injections weekly. Since she has been on the belimumab, she reports less flares of joint pain overall and less rashes. In the past, she used to develop a splotchy red scaly rash on chest and BUE which would come on with sun exposure. She has not had this is a while.     ROS:  Otherwise no reported fevers, chills, oral/nasal ulcers, chest pain, shortness of breath, nausea, vomiting, abdominal pain, diarrhea. +nasal congestion, +cough for ~1 month, +seasonal allergies    Disease History:  - 2005 presented with bilateral partoid swelling, sicca, pancytopenia. Hospitalized with small pericardial effusion.  Eval at that time with +ANA 1:320 speckled, +SSA, +SSB, +RF 120, -dsDNA. Initiated HCQ.  - 2007 - 2010 continued PRN prednisone for parotid swelling. Concurrent intermittent antibiotics for concern for concurrent infections. Evaluated in ENT.  - 2009 trial of MTX for parotid swelling. Hospitalized for abdominal pain & elevated  LFTs. MTX discontinued. After discontinuation of MTX developed worsening joint pain & swelling of hands.   - 2011 developed ACLE/malar rash after discontinuing HCQ. At that time labs were concerning for +ANA 1:640 speckled, -dsDNA. Restarted HCQ.  - 2012 concern for worsening arthritis pain. Initiated azathioprine while continued HCQ.  - 2013 patient self-discontinued medications for nausea.  - 2014 re-presented with malar & anterior throat rash for which HCQ was restarted.  - 2015-2017 lost to follow-up. Restarted HCQ. Concern for elevated LFTs for which she was evaluated by GI & underwent liver biopsy which confirmed NAFLD/fibrosis.  - 2018 - 2019 continued on HCQ. Concern for active synovitis on exam promtping steroid taper.   - 2020 belimumab started for persistent arthralgias, new rash. Underwent bilateral sialendoscopies which improved her salivary movement significantly.    - 2021-2022 continued belimumab, HCQ.    Treatment:  - Belimumab [2020 - current]  - HCQ [2005 - current; intermittent lapses in medication use - current]  - MTX [2009] - discontinued 2/2 severe GI side effects  - Azathioprine [2012 - 2013]  - Multiple prednisone tapers    Labs:  - As above [not all available dating back to 2005]  - 2019: +SSA/SSB, +RF 30, -CCP    Imaging:  - 2019: Xray hands: Nondisplaced fracture of the distal tuft of the left first distal phalanx. No evidence of erosive or inflammatory arthropathy.  - 2019: Xray L foot: No acute fracture or malalignment. Normal soft tissues.     PMH, PFH, PSH as previously documented.  Medications were reviewed this visit.    Objective:     Vitals:    07/18/21 1044   BP: 100/64   BP Site: L Arm   BP Position: Sitting   BP Cuff Size: Medium   Pulse: 57   Temp: 36.4 ??C (97.6 ??F)   TempSrc: Temporal   Weight: 81.5 kg (179 lb 9.6 oz)     Body mass index is 35.08 kg/m??.    Physical Exam  - GENERAL: WD/WN, in no acute distress  - HEAD: normocephalic, atraumatic  - EYES: no scleral icterus, EOMI  - ENT: MMM, no nasal or oral lesions, oropharynx without erythema   - NECK: supple, no palpable lymphadenopathy  - CARDIO: RRR, no murmurs  - PULM: CTAB, symmetric air movement  - NEURO: alert and oriented x 3; moving all extremities; strength 5/5 bilateral UE/LE  - SKIN: erythematous facial rash. Area of hyperpigmentation below the right jawline. Scattered hyperpigmented lesions on both arms              - MSK:      Full ROM of bilateral shoulders, elbows, wrists, UE digits, knees, ankles, LE digits      TTP diffusely of several PIPs but no, swelling, or erythema; normal grip strength bilaterally       Otherwise bilateral shoulders, elbows, wrists, UE digits, knees, ankles, LE digits much swelling or stiffness

## 2021-07-18 NOTE — Unmapped (Signed)
Patient is requesting the following refill  Requested Prescriptions      No prescriptions requested or ordered in this encounter       Recent Visits  Date Type Provider Dept   04/19/21 Office Visit Riley Kill, MD [facility] [address] [facility]   03/07/21 Office Visit Alexandria Lodge, MD [facility] [address] [facility]   02/24/21 Office Visit Mateo Flow, MD [facility] [address] 70 [facility]   02/17/21 Office Visit Maralyn Sago Elon Jester, MD [facility] [address] [facility]   12/07/20 Office Visit Maralyn Sago Elon Jester, MD [facility] [address] [place]   Showing recent visits within past 365 days with a meds authorizing provider and meeting all other requirements  Future Appointments  Date Type Provider Dept   07/19/21 Appointment Riley Kill, MD [facility] [address] [place]   03/08/22 Appointment Alexandria Lodge, MD [facility] [address] [place]   Showing future appointments within next 365 days with a meds authorizing provider and meeting all other requirements       Labs: Not applicable this refill

## 2021-07-19 ENCOUNTER — Ambulatory Visit
Admit: 2021-07-19 | Discharge: 2021-07-20 | Payer: PRIVATE HEALTH INSURANCE | Attending: Family Medicine | Primary: Family Medicine

## 2021-07-19 DIAGNOSIS — K7460 Unspecified cirrhosis of liver: Principal | ICD-10-CM

## 2021-07-19 DIAGNOSIS — E669 Obesity, unspecified: Principal | ICD-10-CM

## 2021-07-19 DIAGNOSIS — K7581 Nonalcoholic steatohepatitis (NASH): Principal | ICD-10-CM

## 2021-07-19 MED ORDER — MOUNJARO 5 MG/0.5 ML SUBCUTANEOUS PEN INJECTOR
SUBCUTANEOUS | 3 refills | 0 days | Status: CP
Start: 2021-07-19 — End: ?

## 2021-07-19 NOTE — Unmapped (Addendum)
Pt followed by hepatology closely. Had [place] 07/11/21--advised Pt to follow up with hepatology about prognosis. Reviewed relevant medications and labs. Pt is following our [facility]. See Obesity tab for medication changes.

## 2021-07-19 NOTE — Unmapped (Signed)
[facility] Follow Up    Assessment/Plan:     Chief Complaint   Patient presents with   ??? Weight Loss       Problem List Items Addressed This Visit        Digestive    Liver cirrhosis secondary to NASH (nonalcoholic steatohepatitis) (CMS-HCC) - Primary     Pt followed by hepatology closely. Had [place] 07/11/21--advised Pt to follow up with hepatology about prognosis. Reviewed relevant medications and labs. Pt is following our [facility]. See Obesity tab for medication changes.          Relevant Medications    tirzepatide (MOUNJARO) 5 mg/0.5 mL PnIj       Other    Class 1 obesity     Tonya Brady is a 46 y.o. 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 Her medical history includes SLE, cirrhosis, GERD, suspected OSA, depression, and emotional / stress eating. Pt wishes weight loss to prevent further liver damage.    Weight Summary:  1. Starting weight/BMI/WC: 209 lb, BMI 39.6 (06/13/19, self report), WC 39 (11/04/19, 1st measure)  2. Target weight/goal: BMI 30 or 159 lb.  3. Today's weight/BMI: 78.9 kg (174 lb), BMI (!) 33.98 (07/19/2021)   4. % Body weight loss: 16.7%  5. Today's Waist Circumference: 36 inches (07/19/2021) <= 38.75 (01/07/20)  6. Today's visit number: 10th  7. Duration in program: 25 mo    Referred by: Tonya Banter, MD in [facility] Medicine.    GOALS: 1) Return to gym routine and get back to walking daily  2) Stay away from ice cream  3) Restart Mounjaro    I have reviewed the patient's medical history, lifestyle history and labs/tests.   My recommendations include the following:    Lifestyle Pattern Summary: Lupus flairup in the last month has been a barrier to working out in the gym but Pt has continued walking. Has continued to make good food choices. We again stressed the importance of reducing ultraprocessed foods and refined carbohydrates.   - See GOALS above.     Weight gain causing medication: None.   Hx of long term steroid use (prednisone 80 mg daily for lupus).  Hx of Ambien for insomnia years ago.    Medication: Taking naltrexone, and Wellbutrin XR as directed. Tolerating well. Denies s/e. Pt has been unable to get [facility] for the last 3 months. Due to liver cirrhosis and previous good weight loss, we would like to continue Mounjaro. Provided Pt with samples of Mounjaro 2.5 mg x3 (Lot #[id number], exp 11/30/22). I sent a script for Mounjaro 5.0 mg to [facility] pharmacy due to progressing cirrhosis.   START Mounjaro (tirzepatide) 2.5 mg once weekly injections, CONTINUE Wellbutrin XL 300 mg once daily, Naltrexone 25 mg BID.    - Wellbutrin: pt was started on this for depression before enrolling at our office. ?weight loss on it.  - Naltrexone: started on 06/13/19 at 209 lb.  Tried  - Orlistat: tried and failed.  - Ozempic -- good WL, but insurance denial   - Mounjaro -- 16 lb WL, but pharmacy denial  Contraindicated  - Topamax: contraindicated dhx of kidney stone.  - Phentermine: contraindicated by hx of palpitations.    Obesity Surgery: Pt does not meet criteria based on BMI. (Updated 07/19/21)    Medical conditions:  Glaucoma: no  Seizures: no  Medullary thyroid cancer (personal or family  hx): Father and PGM both had thyroid goiter. Multiple Endocrine Neoplasia: no  Palpitations/Tachycardia: YES  Chest Pain: no past history of MI.   Headaches/Migraines: YES  Nephrolithiasis: YES  H/o pancreatitis: YES, gallstone pancreatitis and she had cholecystectomy.  GERD: YES    Prior Surgeries:  Choleycystectomy: YES  Hysterectomy: YES    Obesity-associated comorbidities: Cirrhosis, GERD, suspected sleep apnea.  Current barriers include: Anxiety, depression, Sjogren's, lupus, emotional /stress eating, insomnia, fibromyalgia, pinched nerve  Birth Control Methods: N/A; s/p hysterectomy.             Return in about 3 months (around 10/16/2021) for Weight clinic F/U one slot. mounjaro 2.5 (sample). schedule virtual-nutrition, cirrhosis. .    I have reviewed and addressed the patient???s adherence and response to prescribed medications. I have identified patient barriers to following the proposed medication and treatment plan, and have noted opportunities to optimize healthy behaviors. I have answered the patient???s questions to satisfaction and the patient voices understanding.    Time: Greater than 50% of this encounter was spent in direct consultation with the patient in evaluation and discussing all of the above. Duration of encounter: 30 minutes.     HPI:     Tonya Brady is a 46 y.o. year old female who has a  has a past medical history of Bulging lumbar disc (2005), Cirrhosis of liver (CMS-HCC), Depression, Diabetes mellitus (CMS-HCC), Difficult intravenous access, Fibromyalgia (03/09/2013), NASH (nonalcoholic steatohepatitis), Secondary Sjogren's syndrome (CMS-HCC) (03/09/2013), Systemic lupus erythematosus (CMS-HCC) (01/19/2011), and Valvular regurgitation. who presents today for [facility] follow up.    Weight Management History    Wt Readings from Last 6 Encounters:   07/19/21 78.9 kg (174 lb)   07/18/21 81.5 kg (179 lb 9.6 oz)   04/26/21 74.8 kg (165 lb)   04/19/21 75.1 kg (165 lb 8 oz)   03/07/21 79.3 kg (174 lb 12 oz)   02/24/21 81.6 kg (180 lb)         11/04/2019 01/07/2020 07/13/2020 12/07/2020 02/17/2021 07/19/2021   Waist Circumference   Waist Circumference 39 inches 38.75 inches 35.75 inches 36 inches 37 inches 36 inches       Multiple values from one day are sorted in reverse-chronological order          Update: 9 lb gain in 2 mos.  Medication: Pt has not been on Mounjaro for the last 3 mos. Taking wellbutrin and naltrexone as directed. Tolerating well.   Eating Pattern: Reports doing well with food choices.   Physical Activity: Pt has rejoined [employer]. Her son has been helping her with exercises.   Barrier: Pt has had to start working a second FT job which she reports helps her stress    Lab Results Component Value Date    LDL Calculated 101 (H) 03/03/2020    LDL Cholesterol, Calculated 104 06/23/2011    HDL 44 03/03/2020    HDL 35 (L) 06/23/2011    Hemoglobin A1C 5.4 03/03/2020    Hemoglobin A1C 5.9 02/10/2016    Hemoglobin A1C 5.5 06/23/2011    Glucose 93 07/18/2021    TSH 2.500 11/10/2015    TSH 1.92 06/23/2011    AST 16 07/18/2021    AST 19 07/26/2010    ALT 12 07/18/2021    ALT 33 07/26/2010    Alkaline Phosphatase 42 (L) 07/18/2021     Past Medical/Surgical History:     Past Medical History:   Diagnosis Date   ??? Bulging lumbar disc 2005   ???  Cirrhosis of liver (CMS-HCC)    ??? Depression    ??? Diabetes mellitus (CMS-HCC)    ??? Difficult intravenous access    ??? Fibromyalgia 03/09/2013   ??? NASH (nonalcoholic steatohepatitis)    ??? Secondary Sjogren's syndrome (CMS-HCC) 03/09/2013    Secondary to SLE  Diagnosed: 12/2003 (by sx's, parotid gland enlargement, and serologies) Pathology: biopsy not done Labs: + ANA, anti-SSA, anti-SSB, RF    ??? Systemic lupus erythematosus (CMS-HCC) 01/19/2011   ??? Valvular regurgitation      Past Surgical History:   Procedure Laterality Date   ??? CARPAL TUNNEL RELEASE     ??? CESAREAN SECTION     ??? CHOLECYSTECTOMY     ??? HERNIA REPAIR     ??? HYSTERECTOMY     ??? PR REVISE MEDIAN N/CARPAL TUNNEL SURG Left 12/30/2018    Procedure: R16 NEUROPLASTY AND/OR TRANSPOSITION; MEDIAN NERVE AT CARPAL TUNNEL;  Surgeon: Theodora Blow Jacqlyn Krauss, MD;  Location: ASC OR [facility];  Service: Orthopedics   ??? PR SALIVARY SURG UNLISTED PROC Right 12/06/2018    Procedure: R22 UNLISTED PROC SALIVARY GLANDS/DUCTS;  Surgeon: Michelle Piper, MD;  Location: ASC OR [facility];  Service: ENT   ??? PR SALIVARY SURG UNLISTED PROC Left 02/18/2019    Procedure: R25 UNLISTED PROC SALIVARY GLANDS/DUCTS;  Surgeon: Michelle Piper, MD;  Location: ASC OR [facility];  Service: ENT   ??? PR UPPER GI ENDOSCOPY,DIAGNOSIS N/A 05/10/2016    Procedure: UGI ENDO, INCLUDE ESOPHAGUS, STOMACH, & DUODENUM &/OR JEJUNUM; DX W/WO COLLECTION SPECIMN, BY BRUSH OR WASH;  Surgeon: Carmon Ginsberg, MD;  Location: GI PROCEDURES [facility];  Service: Gastroenterology   ??? PR UPPER GI ENDOSCOPY,DIAGNOSIS N/A 05/02/2019    Procedure: UGI ENDO, INCLUDE ESOPHAGUS, STOMACH, & DUODENUM &/OR JEJUNUM; DX W/WO COLLECTION SPECIMN, BY BRUSH OR WASH;  Surgeon: Chriss Driver, MD;  Location: GI PROC[facility]URES [facility];  Service: Gastroenterology   ??? PR UPPER GI ENDOSCOPY,DIAGNOSIS N/A 04/26/2021    Procedure: UGI ENDO, INCLUDE ESOPHAGUS, STOMACH, & DUODENUM &/OR JEJUNUM; DX W/WO COLLECTION SPECIMN, BY BRUSH OR WASH;  Surgeon: Pia Mau, MD;  Location: GI [facility]URES [facility];  Service: Gastroenterology   ??? TOTAL ABDOMINAL HYSTERECTOMY     ??? TUBAL LIGATION         Social History:     Social History     Socioeconomic History   ??? Marital status: Married     Spouse name: None   ??? Number of children: None   ??? Years of education: None   ??? Highest education level: None   Tobacco Use   ??? Smoking status: Never   ??? Smokeless tobacco: Never   Vaping Use   ??? Vaping Use: Never used   Substance and Sexual Activity   ??? Alcohol use: No     Alcohol/week: 0.0 standard drinks   ??? Drug use: No   ??? Sexual activity: Not Currently     Partners: Male   Other Topics Concern   ??? Do you use sunscreen? Yes   ??? Tanning bed use? No   ??? Are you easily burned? No   ??? Excessive sun exposure? No   ??? Blistering sunburns? No       Family History:     Family History   Problem Relation Age of Onset   ??? Cancer Mother    ??? Hypertension Mother    ??? Heart disease Father    ??? Heart attack Father    ??? Diabetes Maternal Grandmother    ???  Hypertension Maternal Grandmother    ??? Thyroid disease Maternal Grandmother    ??? Breast cancer Maternal Grandmother    ??? Breast cancer Paternal Grandmother    ??? Diabetes Paternal Grandmother    ??? Thyroid disease Paternal Grandmother    ??? Depression Son    ??? Mental illness Son    ??? Breast cancer Maternal Aunt    ??? Aneurysm Maternal Aunt    ??? Diabetes Maternal Aunt    ??? Diabetes Maternal Uncle    ??? Heart attack Maternal Uncle    ??? Melanoma Neg Hx    ??? Basal cell carcinoma Neg Hx    ??? Squamous cell carcinoma Neg Hx    ??? Drug abuse Neg Hx    ??? Stroke Neg Hx        Allergies:     Aspirin, Fish containing products, Iodine, and Shellfish containing products    Current Medications:     Current Outpatient Medications   Medication Sig Dispense Refill   ??? belimumab (BENLYSTA) 200 mg/mL AtIn Inject the contents of 1 injector (200mg ) under the skin every 7 days 4 mL 2   ??? buPROPion (WELLBUTRIN XL) 300 MG 24 hr tablet Take 1 tablet (300 mg total) by mouth every morning. 60 tablet 2   ??? empty container Misc Use as directed 1 each 2   ??? famotidine (PEPCID) 40 MG tablet TAKE 1 TABLET(40 MG) BY MOUTH EVERY NIGHT AS NEEDED FOR HEARTBURN 90 tablet 3   ??? fluticasone propionate (FLONASE) 50 mcg/actuation nasal spray 1 spray into each nostril daily. 16 g 11   ??? hydrOXYchloroQUINE (PLAQUENIL) 200 mg tablet Take 1 tablet (200 mg total) by mouth Two (2) times a day. 180 tablet 1   ??? ibuprofen (MOTRIN) 800 MG tablet ibuprofen 800 mg tablet     ??? naltrexone (DEPADE) 50 mg tablet Take 0.5 tablets (25 mg total) by mouth two (2) times a day. 60 tablet 2   ??? tirzepatide (MOUNJARO) 5 mg/0.5 mL PnIj Inject 5 mg under the skin once a week. 2 mL 3     No current facility-administered medications for this visit.       I have reviewed and (if needed) updated the patient's problem list, medications, allergies, past medical and surgical history, social and family history.    ROS:     Unless otherwise stated in the HPI:  CONSTITUTIONAL: no fever chills.   HEENT: Eyes: No diplopia or blurred vision. ENT: No earache, sore throat or runny nose.   CARDIOVASCULAR: No pressure, squeezing, strangling, tightness, heaviness or aching about the chest, neck, axilla or epigastrium.   RESPIRATORY: No cough, shortness of breath, PND or orthopnea.   GASTROINTESTINAL: No nausea, vomiting or diarrhea.   GENITOURINARY: No dysuria, frequency or urgency.   MUSCULOSKELETAL: no new pains, no joint swelling or redness.   SKIN: No change in skin, hair or nails.   NEUROLOGIC: No paresthesias, fasciculations, seizures or weakness.   PSYCHIATRIC: No disorder of thought or mood.   ENDOCRINE: No heat or cold intolerance, polyuria or polydipsia.   HEMATOLOGICAL: No easy bruising or bleeding.    Vital Signs:     Body mass index is 33.98 kg/m??.  Waist Circumference: 36 inches     Wt Readings from Last 3 Encounters:   07/19/21 78.9 kg (174 lb)   07/18/21 81.5 kg (179 lb 9.6 oz)   04/26/21 74.8 kg (165 lb)     Temp Readings from Last 3 Encounters:   07/19/21  35.9 ??C (96.7 ??F) (Temporal)   07/18/21 36.4 ??C (97.6 ??F) (Temporal)   04/26/21 36.2 ??C (97.1 ??F) (Temporal)     BP Readings from Last 3 Encounters:   07/19/21 113/76   07/18/21 100/64   04/26/21 115/63     Pulse Readings from Last 3 Encounters:   07/19/21 68   07/18/21 57   04/26/21 76         11/04/2019 01/07/2020 07/13/2020 12/07/2020 02/17/2021 07/19/2021   Waist Circumference   Waist Circumference 39 inches 38.75 inches 35.75 inches 36 inches 37 inches 36 inches       Multiple values from one day are sorted in reverse-chronological order     Physical Exam:     General: well appearing, in NAD, Body mass index is 33.98 kg/m??. Ambulatory without help.  Body fat distribution: central & gluteofemoral adiposity. No supraclavicular adiposity. No dorsal adiposity. Waist Circumference: 36 inches     Head: normocephalic atraumatic.  Eyes: PERRLA, EOMI, Sclera WNL.   Oral pharynx: moist, no exudate, no erythema, not enlarged. Good dental hygiene. Moderate OP crowding.  Neck: supple, no LAD, no thyromegaly, no bruit.  CV: RRR no rubs or murmurs. Peripheral edema: none.  Lungs: clear bilaterally to ascultation. No wheezing.  Abdomen: soft, NT/ND. No intertrigo. Moderate pannus. No hepatomegaly.   Extremities: no clubbing, cyanosis, No edema.  Skin: no concerning lesions, rashes observed. No lipomas, no acanthosis nigricans.  Neuro: Alert and oriented X 3.    Labs:     Office Visit on 07/18/2021   Component Date Value Ref Range Status   ??? C3 Complement 07/18/2021 81 (L)  90 - 170 mg/dL Final   ??? C4 Complement 07/18/2021 26.1  12.0 - 36.0 mg/dL Final   ??? Color, UA 16/02/9603 Yellow   Final   ??? Clarity, UA 07/18/2021 Clear   Final   ??? Specific Gravity, UA 07/18/2021 <=1.005  1.005 - 1.030 Final   ??? pH, UA 07/18/2021 5.5  5.0 - 9.0 Final   ??? Leukocyte Esterase, UA 07/18/2021 Negative  Negative Final   ??? Nitrite, UA 07/18/2021 Negative  Negative Final   ??? Protein, UA 07/18/2021 Negative  Negative Final   ??? Glucose, UA 07/18/2021 Negative  Negative Final   ??? Ketones, UA 07/18/2021 Negative  Negative Final   ??? Urobilinogen, UA 07/18/2021 0.2 mg/dL  0.2 - 2.0 mg/dL Final   ??? Bilirubin, UA 07/18/2021 Negative  Negative Final   ??? Blood, UA 07/18/2021 Negative  Negative Final   ??? RBC, UA 07/18/2021 <1  0 - 3 /HPF Final   ??? WBC, UA 07/18/2021 <1  0 - 3 /HPF Final   ??? Squam Epithel, UA 07/18/2021 1  0 - 5 /HPF Final   ??? Bacteria, UA 07/18/2021 Few (A)  None Seen /HPF Final   ??? Creat U 07/18/2021 11.2  Undefined mg/dL Final   ??? Protein, Ur 07/18/2021 <6.0  Undefined mg/dL Final   ??? Protein/Creatinine Ratio, Urine 07/18/2021    Final    Unable to calculate.   ??? Sodium 07/18/2021 143  135 - 145 mmol/L Final   ??? Potassium 07/18/2021 4.2  3.4 - 4.8 mmol/L Final   ??? Chloride 07/18/2021 113 (H)  98 - 107 mmol/L Final   ??? CO2 07/18/2021 24.2  20.0 - 31.0 mmol/L Final   ??? Anion Gap 07/18/2021 6  5 - 14 mmol/L Final   ??? BUN 07/18/2021 10  9 - 23 mg/dL Final   ??? Creatinine 07/18/2021 0.64  0.60 - 0.80 mg/dL Final   ??? BUN/Creatinine Ratio 07/18/2021 16   Final   ??? eGFR CKD-EPI (2021) Female 07/18/2021 >90  >=60 mL/min/1.72m2 Final    eGFR calculated with CKD-EPI 2021 equation in accordance with [employer] and [employer] of Nephrology Task Force recommendations.   ??? Glucose 07/18/2021 93  70 - 179 mg/dL Final   ??? Calcium 16/02/9603 9.2  8.7 - 10.4 mg/dL Final   ??? Albumin 54/01/8118 3.8  3.4 - 5.0 g/dL Final   ??? Total Protein 07/18/2021 7.1  5.7 - 8.2 g/dL Final   ??? Total Bilirubin 07/18/2021 0.2 (L)  0.3 - 1.2 mg/dL Final   ??? AST 14/78/2956 16  <=34 U/L Final   ??? ALT 07/18/2021 12  10 - 49 U/L Final   ??? Alkaline Phosphatase 07/18/2021 42 (L)  46 - 116 U/L Final   ??? WBC 07/18/2021 2.8 (L)  3.6 - 11.2 10*9/L Final   ??? RBC 07/18/2021 4.14  3.95 - 5.13 10*12/L Final   ??? HGB 07/18/2021 12.7  11.3 - 14.9 g/dL Final   ??? HCT 21/30/8657 38.4  34.0 - 44.0 % Final   ??? MCV 07/18/2021 92.9  77.6 - 95.7 fL Final   ??? MCH 07/18/2021 30.7  25.9 - 32.4 pg Final   ??? MCHC 07/18/2021 33.1  32.0 - 36.0 g/dL Final   ??? RDW 84/69/6295 12.9  12.2 - 15.2 % Final   ??? MPV 07/18/2021 10.3  6.8 - 10.7 fL Final   ??? Platelet 07/18/2021 134 (L)  150 - 450 10*9/L Final   ??? Neutrophils % 07/18/2021 56.0  % Final   ??? Lymphocytes % 07/18/2021 26.0  % Final   ??? Monocytes % 07/18/2021 15.8  % Final   ??? Eosinophils % 07/18/2021 1.8  % Final   ??? Basophils % 07/18/2021 0.4  % Final   ??? Absolute Neutrophils 07/18/2021 1.6 (L)  1.8 - 7.8 10*9/L Final   ??? Absolute Lymphocytes 07/18/2021 0.7 (L)  1.1 - 3.6 10*9/L Final   ??? Absolute Monocytes 07/18/2021 0.5  0.3 - 0.8 10*9/L Final   ??? Absolute Eosinophils 07/18/2021 0.1  0.0 - 0.5 10*9/L Final   ??? Absolute Basophils 07/18/2021 0.0  0.0 - 0.1 10*9/L Final       Follow-up:     Recommend well adult check/physical in 1 year. Otherwise, follow up as below.  Return in about 3 months (around 10/16/2021) for Weight clinic F/U one slot. mounjaro 2.5 (sample). schedule virtual-nutrition, cirrhosis. Marland Kitchen    Health maintenance reviewed and recommendations made based on [place] Preventative Task Force (USPTF) recommendations. Reviewed appropriate diet and exercise. Patient stated understanding and there were no barriers to learning.     I attest that I, Constance Goltz, personally documented this note while acting as scribe for Marshell Garfinkel, MD.      Constance Goltz, Scribe.  07/19/2021     The documentation recorded by the scribe accurately reflects the service I personally performed and the decisions made by me.    Marshell Garfinkel, MD

## 2021-07-19 NOTE — Unmapped (Addendum)
[name] is a 46 y.o. 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 Her medical history includes SLE, cirrhosis, GERD, suspected OSA, depression, and emotional / stress eating. Pt wishes weight loss to prevent further liver damage.    Weight Summary:  1. Starting weight/BMI/WC: 209 lb, BMI 39.6 (06/13/19, self report), WC 39 (11/04/19, 1st measure)  2. Target weight/goal: BMI 30 or 159 lb.  3. Today's weight/BMI: 78.9 kg (174 lb), BMI (!) 33.98 (07/19/2021)   4. % Body weight loss: 16.7%  5. Today's Waist Circumference: 36 inches (07/19/2021) <= 38.75 (01/07/20)  6. Today's visit number: 10th  7. Duration in program: 25 mo    Referred by: [name], MD in [facility] Medicine.    GOALS: 1) Return to gym routine and get back to walking daily  2) Stay away from ice cream  3) Restart Mounjaro    I have reviewed the patient's medical history, lifestyle history and labs/tests.   My recommendations include the following:    Lifestyle Pattern Summary: Lupus flairup in the last month has been a barrier to working out in the gym but Pt has continued walking. Has continued to make good food choices. We again stressed the importance of reducing ultraprocessed foods and refined carbohydrates.   - See GOALS above.     Weight gain causing medication: None.   Hx of long term steroid use (prednisone 80 mg daily for lupus).  Hx of Ambien for insomnia years ago.    Medication: Taking naltrexone, and Wellbutrin XR as directed. Tolerating well. Denies s/e. Pt has been unable to get [facility] for the last 3 months. Due to liver cirrhosis and previous good weight loss, we would like to continue Mounjaro. Provided Pt with samples of Mounjaro 2.5 mg x3 (Lot #[id number], exp 11/30/22). I sent a script for Mounjaro 5.0 mg to [facility] pharmacy due to progressing cirrhosis.   START Mounjaro (tirzepatide) 2.5 mg once weekly injections, CONTINUE Wellbutrin XL 300 mg once daily, Naltrexone 25 mg BID. - Wellbutrin: pt was started on this for depression before enrolling at our office. ?weight loss on it.  - Naltrexone: started on 06/13/19 at 209 lb.  Tried  - Orlistat: tried and failed.  - Ozempic -- good WL, but insurance denial   - Mounjaro -- 16 lb WL, but pharmacy denial  Contraindicated  - Topamax: contraindicated dhx of kidney stone.  - Phentermine: contraindicated by hx of palpitations.    Obesity Surgery: Pt does not meet criteria based on BMI. (Updated 07/19/21)    Medical conditions:  Glaucoma: no  Seizures: no  Medullary thyroid cancer (personal or family hx): Father and PGM both had thyroid goiter. Multiple Endocrine Neoplasia: no  Palpitations/Tachycardia: YES  Chest Pain: no past history of MI.   Headaches/Migraines: YES  Nephrolithiasis: YES  H/o pancreatitis: YES, gallstone pancreatitis and she had cholecystectomy.  GERD: YES    Prior Surgeries:  Choleycystectomy: YES  Hysterectomy: YES    Obesity-associated comorbidities: Cirrhosis, GERD, suspected sleep apnea.  Current barriers include: Anxiety, depression, Sjogren's, lupus, emotional /stress eating, insomnia, fibromyalgia, pinched nerve  Birth Control Methods: N/A; s/p hysterectomy.

## 2021-07-20 DIAGNOSIS — K7460 Unspecified cirrhosis of liver: Principal | ICD-10-CM

## 2021-07-20 DIAGNOSIS — K7581 Nonalcoholic steatohepatitis (NASH): Principal | ICD-10-CM

## 2021-07-20 DIAGNOSIS — N3000 Acute cystitis without hematuria: Principal | ICD-10-CM

## 2021-07-20 LAB — ANTI-DNA ANTIBODY, DOUBLE-STRANDED: DSDNA ANTIBODY: NEGATIVE

## 2021-07-20 MED ORDER — NITROFURANTOIN MONOHYDRATE/MACROCRYSTALS 100 MG CAPSULE
ORAL_CAPSULE | Freq: Two times a day (BID) | ORAL | 0 refills | 5 days | Status: CP
Start: 2021-07-20 — End: 2021-07-25

## 2021-08-05 DIAGNOSIS — M329 Systemic lupus erythematosus, unspecified: Principal | ICD-10-CM

## 2021-08-05 MED ORDER — BENLYSTA 200 MG/ML SUBCUTANEOUS AUTO-INJECTOR
SUBCUTANEOUS | 2 refills | 28 days | Status: CP
Start: 2021-08-05 — End: ?
  Filled 2021-08-10: qty 4, 28d supply, fill #0

## 2021-08-05 NOTE — Unmapped (Signed)
Last Visit Date: 07/18/2021  Next Visit Date: Visit date not found  Benlista refill request  Lab Results   Component Value Date    ALT 12 07/18/2021    AST 16 07/18/2021    ALBUMIN 3.8 07/18/2021    CREATININE 0.64 07/18/2021     Lab Results   Component Value Date    WBC 2.8 (L) 07/18/2021    HGB 12.7 07/18/2021    HCT 38.4 07/18/2021    PLT 134 (L) 07/18/2021     Lab Results   Component Value Date    NEUTROPCT 56.0 07/18/2021    LYMPHOPCT 26.0 07/18/2021    MONOPCT 15.8 07/18/2021    EOSPCT 1.8 07/18/2021    BASOPCT 0.4 07/18/2021

## 2021-08-05 NOTE — Unmapped (Signed)
[facility] Refill Coordination Note    Specialty Medication(s) to be Shipped:   Inflammatory Disorders: Benlysta    Other medication(s) to be shipped: No additional medications requested for fill at this time     Tonya Brady, DOB: Aug 12, 1975  Phone: [phone] (home)       All above HIPAA information was verified with patient.     Was a Nurse, learning disability used for this call? No    Completed refill call assessment today to schedule patient's medication shipment from the [facility] [phone]).  All relevant notes have been reviewed.     Specialty medication(s) and dose(s) confirmed: Regimen is correct and unchanged.   Changes to medications: Tonya Brady reports no changes at this time.  Changes to insurance: No  New side effects reported not previously addressed with a pharmacist or physician: None reported  Questions for the pharmacist: No    Confirmed patient received a Conservation officer, historic buildings and a Surveyor, mining with first shipment. The patient will receive a drug information handout for each medication shipped and additional FDA Medication Guides as required.       DISEASE/MEDICATION-SPECIFIC INFORMATION        For patients on injectable medications: Patient currently has 0 doses left.  Next injection is scheduled for 3/23.    SPECIALTY MEDICATION ADHERENCE     Medication Adherence    Specialty Medication: Benlysta  Patient is on additional specialty medications: No  Patient is on more than two specialty medications: No  Any gaps in refill history greater than 2 weeks in the last 3 months: no  Demonstrates understanding of importance of adherence: yes  Informant: patient              Were doses missed due to medication being on hold? No    Benlysta 200mg /ml: Patient has 0 days of medication on hand    REFERRAL TO PHARMACIST     Referral to the pharmacist: Not needed      [facility]     Shipping address confirmed in Epic.     Delivery Scheduled: Yes, Expected medication delivery date: 3/22.  However, Rx request for refills was sent to the provider as there are none remaining.     Medication will be delivered via Same Day Courier to the prescription address in Epic WAM.    Tonya Brady   [facility] Specialty Technician

## 2021-09-08 NOTE — Unmapped (Signed)
[facility] Refill Coordination Note    Specialty Medication(s) to be Shipped:   Inflammatory Disorders: Benlysta    Other medication(s) to be shipped: No additional medications requested for fill at this time     TYANNAH SANE, DOB: 10-31-1975  Phone: [phone] (home)       All above HIPAA information was verified with patient.     Was a Nurse, learning disability used for this call? No    Completed refill call assessment today to schedule patient's medication shipment from the [facility] [phone]).  All relevant notes have been reviewed.     Specialty medication(s) and dose(s) confirmed: Regimen is correct and unchanged.   Changes to medications: Lee reports no changes at this time.  Changes to insurance: No  New side effects reported not previously addressed with a pharmacist or physician: None reported  Questions for the pharmacist: No    Confirmed patient received a Conservation officer, historic buildings and a Surveyor, mining with first shipment. The patient will receive a drug information handout for each medication shipped and additional FDA Medication Guides as required.       DISEASE/MEDICATION-SPECIFIC INFORMATION        For patients on injectable medications: Patient currently has 0 doses left.  Next injection is scheduled for 4/24.    SPECIALTY MEDICATION ADHERENCE     Medication Adherence    Patient reported X missed doses in the last month: 0  Specialty Medication: Benlysta  Patient is on additional specialty medications: No  Patient is on more than two specialty medications: No  Any gaps in refill history greater than 2 weeks in the last 3 months: no  Demonstrates understanding of importance of adherence: yes  Informant: patient              Were doses missed due to medication being on hold? No    Benlysta 200mg /ml: Patient has 0 days of medication on hand    REFERRAL TO PHARMACIST     Referral to the pharmacist: Not needed      [facility]     Shipping address confirmed in Epic. Delivery Scheduled: Yes, Expected medication delivery date: 4/24.     Medication will be delivered via Same Day Courier to the prescription address in Epic WAM.    [name]   [facility] Specialty Technician

## 2021-09-12 MED FILL — BENLYSTA 200 MG/ML SUBCUTANEOUS AUTO-INJECTOR: SUBCUTANEOUS | 28 days supply | Qty: 4 | Fill #1

## 2021-09-12 NOTE — Unmapped (Signed)
[facility]  [facility]  [address] [place]  [place], [place]  [address]  Ph: [phone]  Fax: [phone]     HEPATOLOGY RETURN VISIT PROGRESS NOTE    REFERRING PROVIDER: Alexandria Lodge, MD  PRIMARY CARE PROVIDER: Alexandria Lodge, MD    DATE OF SERVICE: 09/14/2021    ASSESSMENT AND PLAN:   Tonya Brady is a 46 y.o. 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 She established care with [facility] in 01/2016 when she was referred for elevated liver enzymes and findings of possible cirrhosis on ultrasound. She underwent liver biopsy on 03/2016 showing cirrhosis secondary to steatohepatitis without evidence of AIH. Her cirrhosis is well compensated with no history of ascites, gastroesophageal varices or hepatic encephalopathy. She is not being considered for transplant due to low MELD.     1. NASH Cirrhosis: Stable, asked her to continue weight loss efforts. She is motivated to do this.   -CBC, CMP done in February, today check INR, AFP, B12 and vitamin D  -Continue weight loss  -Okay with Mounjaro    2. EV Screening: Last done 04/2021 with no varices  -Repeat EGD due 04/2023, schedule with colonoscopy    3. HCC Screening: Last done 06/2021 with no lesions. Repeat U/S q6 months + AFP  -U/S due 12/2021, will schedule with next follow up in late September to coordinate office visit with [place]  -AFP sent today    4. Health Maintenance:  -Osteoporosis: Increased risk of osteopenia in patients with cirrhosis. Recommend checking Vitamin D level as well as DEXA scan if abnormal.   -Cervical Cancer: Last pap smear done: s/p hysterectomy  -Mammogram: Last done: Never done, defer to PCP  -Colon Cancer: Due at age 29, to be done next year with EGD    5. Vaccinations: We recommend that patients have vaccinations to prevent various infections that can occur, especially in the setting of having underlying liver disease. The following vaccinations should be given:  -Hepatitis A: Vaccinated  -Hepatitis B: Vaccinated  -Influenza (yearly): 01/2021  -Pneumococcal: PPSV23 02/2019. Prevnar 20 (03/07/2021)  -Zoster: Shingrix (age > 50). Two doses given 2-6 months apart.   -SARS-CoV-2: J&J, Pfizer booster, bivalent Pfizer 06/24/2021    I spent a majority of my time today with the patient reviewing historical data, old records (including faxed data and data in Care Everywhere), performing a physical examination, and formulating an assessment and plan together with the patient.    Return to clinic in 6 months      Tonya Heidelberg, NP  [facility]  Division of Gastroenterology & Hepatology    I personally spent 45 minutes face-to-face and non-face-to-face in the care of this patient, which includes all pre, intra, and post visit time on the date of service.  All documented time was specific to the E/M visit and does not include any procedures that may have been performed.  Subjective   SUBJECTIVE:     CHIEF COMPLAINT: NASH Cirrhosis    HISTORY OF PRESENT ILLNESS:      Tonya Brady is a 46 y.o. 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    INTERVAL HISTORY:(since the time of the patient's last visit):    The patient was last seen by me on 12/10/20. She continues to follow the weight loss  clinic closely and maintained on Mounjaro for weight loss medication. She has had some difficulty with insurance approval of Mounjaro as well as Ozempic. Currently receiving samples of a lower dose of Mounjaro. As a result she has gained a few lbs.  No jaundice, gastrointestinal bleeding, abdominal swelling or confusion.     Objective   OBJECTIVE:   VITAL SIGNS: BP 105/65 (BP Site: L Arm, BP Position: Sitting, BP Cuff Size: Large)  - Pulse 65  - Temp 36.2 ??C (97.2 ??F) (Tympanic)  - Ht 152.4 cm (5')  - Wt 75.7 kg (166 lb 14.4 oz)  - SpO2 99%  - BMI 32.60 kg/m??   Wt Readings from Last 3 Encounters: 09/14/21 75.7 kg (166 lb 14.4 oz)   07/19/21 78.9 kg (174 lb)   07/18/21 81.5 kg (179 lb 9.6 oz)     PHYSICAL EXAM:  Constitutional: Alert, no acute distress, with good coloring  HENT: conjunctiva clear, anicteric, nares without discharge, neck supple  CV: Regular rate and rhythm  Lung: respirations even and unlabored  Abdomen: soft, non-distended, non-tender.  No ascites.   Extremities: No edema, well perfused  Neuro: No focal deficits. No asterixis.   Mental Status: Thought organized, appropriate affect, engaged in conversation    DIAGNOSTIC STUDIES:  I have reviewed all pertinent diagnostic studies, including:  Laboratory results:  Lab Results   Component Value Date    WBC 2.8 (L) 07/18/2021    HGB 12.7 07/18/2021    MCV 92.9 07/18/2021    PLT 134 (L) 07/18/2021    Lab Results   Component Value Date    NA 143 07/18/2021    K 4.2 07/18/2021    CL 113 (H) 07/18/2021    CREATININE 0.64 07/18/2021    GLU 93 07/18/2021    CALCIUM 9.2 07/18/2021      Lab Results   Component Value Date    ALBUMIN 3.8 07/18/2021    AST 16 07/18/2021    ALT 12 07/18/2021    ALKPHOS 42 (L) 07/18/2021    BILITOT 0.2 (L) 07/18/2021    Lab Results   Component Value Date    INR 1.20 12/10/2020        Lab Results   Component Value Date    AFPTM 3 12/10/2020    AFPTM 2 06/14/2020    AFPTM 2 03/07/2019     MELD-Na: 8 at 12/10/2020 12:21 PM  Calculated from:  Serum Creatinine: 0.72 mg/dL (Using min of 1 mg/dL) at 1/61/0960 45:40 PM  Serum Sodium: 142 mmol/L (Using max of 137 mmol/L) at 12/10/2020 12:21 PM  Total Bilirubin: 0.3 mg/dL (Using min of 1 mg/dL) at 9/81/1914 78:29 PM  INR(ratio): 1.20 at 12/10/2020 12:21 PM    Radiographic studies: Individually Reviewed  07/11/2021 abdominal ultrasound  Heterogenous liver, likely representing chronic liver disease.  No focal liver lesion identified.       LI-RADS ultrasound category: [place] 1- negative.       Liver Biopsy 03/24/2016:  A: Liver, core biopsy  - Liver with bridging fibrosis/early cirrhosis (stage 3-4), mild steatosis, and changes consistent with steatohepatitis

## 2021-09-14 ENCOUNTER — Ambulatory Visit: Admit: 2021-09-14 | Discharge: 2021-09-15 | Payer: PRIVATE HEALTH INSURANCE

## 2021-09-14 DIAGNOSIS — K7581 Nonalcoholic steatohepatitis (NASH): Principal | ICD-10-CM

## 2021-09-14 DIAGNOSIS — K7460 Unspecified cirrhosis of liver: Principal | ICD-10-CM

## 2021-09-14 LAB — AFP TUMOR MARKER: AFP-TUMOR MARKER: <2 ng/mL (ref <=8)

## 2021-09-14 LAB — PROTIME-INR
INR: 1.21
PROTIME: 13.8 s — ABNORMAL HIGH (ref 9.8–12.8)

## 2021-09-14 LAB — VITAMIN B12: VITAMIN B-12: 360 pg/mL (ref 211–911)

## 2021-09-21 LAB — VITAMIN D 25 HYDROXY: VITAMIN D, TOTAL (25OH): 28.8 ng/mL (ref 20.0–80.0)

## 2021-10-06 NOTE — Unmapped (Signed)
[facility] Refill Coordination Note    Specialty Medication(s) to be Shipped:   Inflammatory Disorders: Benlysta    Other medication(s) to be shipped: No additional medications requested for fill at this time     Tonya Brady, DOB: May 26, 1975  Phone: [phone] (home)       All above HIPAA information was verified with patient.     Was a Nurse, learning disability used for this call? No    Completed refill call assessment today to schedule patient's medication shipment from the [facility] [phone]).  All relevant notes have been reviewed.     Specialty medication(s) and dose(s) confirmed: Regimen is correct and unchanged.   Changes to medications: Charlene reports no changes at this time.  Changes to insurance: No  New side effects reported not previously addressed with a pharmacist or physician: None reported  Questions for the pharmacist: No    Confirmed patient received a Conservation officer, historic buildings and a Surveyor, mining with first shipment. The patient will receive a drug information handout for each medication shipped and additional FDA Medication Guides as required.       DISEASE/MEDICATION-SPECIFIC INFORMATION        For patients on injectable medications: Patient currently has 2 doses left.  Next injection is scheduled for 5/20.    SPECIALTY MEDICATION ADHERENCE     Medication Adherence    Patient reported X missed doses in the last month: 0  Specialty Medication: Benlysta  Patient is on additional specialty medications: No  Patient is on more than two specialty medications: No  Any gaps in refill history greater than 2 weeks in the last 3 months: no  Demonstrates understanding of importance of adherence: yes  Informant: patient              Were doses missed due to medication being on hold? No    Benlysta 200mg /ml: Patient has 14 days of medication on hand    REFERRAL TO PHARMACIST     Referral to the pharmacist: Not needed      [facility]     Shipping address confirmed in Epic. Delivery Scheduled: Yes, Expected medication delivery date: 5/25.     Medication will be delivered via Same Day Courier to the prescription address in Epic WAM.    [name]   [facility] Specialty Technician

## 2021-10-13 DIAGNOSIS — M329 Systemic lupus erythematosus, unspecified: Principal | ICD-10-CM

## 2021-10-13 MED ORDER — BENLYSTA 200 MG/ML SUBCUTANEOUS AUTO-INJECTOR
SUBCUTANEOUS | 2 refills | 28 days | Status: CP
Start: 2021-10-13 — End: ?
  Filled 2021-10-13: qty 4, 28d supply, fill #2
  Filled 2021-11-09: qty 4, 28d supply, fill #0

## 2021-10-13 NOTE — Unmapped (Signed)
Benlysta refill  Last Visit Date: 07/18/2021  Next Visit Date: 02/06/2022    Lab Results   Component Value Date    ALT 12 07/18/2021    AST 16 07/18/2021    ALBUMIN 3.8 07/18/2021    CREATININE 0.64 07/18/2021     Lab Results   Component Value Date    WBC 2.8 (L) 07/18/2021    HGB 12.7 07/18/2021    HCT 38.4 07/18/2021    PLT 134 (L) 07/18/2021     Lab Results   Component Value Date    NEUTROPCT 56.0 07/18/2021    LYMPHOPCT 26.0 07/18/2021    MONOPCT 15.8 07/18/2021    EOSPCT 1.8 07/18/2021    BASOPCT 0.4 07/18/2021

## 2021-10-20 ENCOUNTER — Ambulatory Visit
Admit: 2021-10-20 | Discharge: 2021-10-21 | Payer: PRIVATE HEALTH INSURANCE | Attending: Family Medicine | Primary: Family Medicine

## 2021-10-20 DIAGNOSIS — E669 Obesity, unspecified: Principal | ICD-10-CM

## 2021-10-20 DIAGNOSIS — K7581 Nonalcoholic steatohepatitis (NASH): Principal | ICD-10-CM

## 2021-10-20 DIAGNOSIS — K7460 Unspecified cirrhosis of liver: Principal | ICD-10-CM

## 2021-10-20 NOTE — Unmapped (Signed)
Pt followed by hepatology closely. Reviewed relevant medications and labs. Pt is following our Weight Management Clinic. See Obesity tab for medication changes.

## 2021-10-20 NOTE — Unmapped (Signed)
[facility] Follow Up    Assessment/Plan:     Chief Complaint   Patient presents with   ??? Follow-up     Wt mngmt       Problem List Items Addressed This Visit        Unprioritized    Liver cirrhosis secondary to NASH (nonalcoholic steatohepatitis) (CMS-HCC) - Primary     Pt followed by hepatology closely. Reviewed relevant medications and labs. Pt is following our Weight Management Clinic. See Obesity tab for medication changes.          Class 1 obesity     [name] is a 46 y.o. WF with Class 2 obesity due to long term steroid use (dx of SLE since age 27). Her medical history includes SLE, cirrhosis, GERD, suspected OSA, depression, and emotional / stress eating. Pt wishes weight loss to prevent further liver damage.    Weight Summary:  1. Starting weight/BMI/WC: 209 lb, BMI 39.6 (06/13/19, self report), WC 39 (11/04/19, 1st measure)  2. Target weight/goal: BMI 30 or 159 lb.  3. Today's weight/BMI: 75.8 kg (167 lb 1.9 oz), BMI (!) 32.64 (10/20/2021)   4. % Body weight loss: 16.7%  5. Today's Waist Circumference: 32.5 inches (10/20/2021) <= 38.75 (01/07/20)  6. Today's visit number: 11th  7. Duration in program: 28 mo    Referred by: [name], MD in [facility].    GOALS: 1) Increase going to gym and cont walking daily  2) Cont healthy eating pattern 3) Cont Mounjaro    I have reviewed the patient's medical history, lifestyle history and labs/tests.   My recommendations include the following:    Lifestyle Pattern Summary: Encouraged Pt to continue good eating habits and regular physical activity. Enouraged Pt to increase resistance training at the gym.  - See GOALS above.     Weight gain causing medication: None.   Hx of long term steroid use (prednisone 80 mg daily for lupus).  Hx of Ambien for insomnia years ago.    Medication: Taking naltrexone, and Wellbutrin XR as directed. Tolerating well. Denies s/e. Pt has been unable to get [facility] through the pharmacy. Due to liver cirrhosis and previous good weight loss, we would like to continue Mounjaro. Provided Pt with samples of Mounjaro 2.5 mg x3 (Lot #[id number] exp 5/24). Pt verbalized understanding and agreement with plan.    CONTINUE Mounjaro (tirzepatide) 2.5 mg once weekly injections, CONTINUE Wellbutrin XL 300 mg once daily, Naltrexone 25 mg BID.    - Wellbutrin: pt was started on this for depression before enrolling at our office. ?weight loss on it.  - Naltrexone: started on 06/13/19 at 209 lb.  Tried  - Orlistat: tried and failed.  - Ozempic -- good WL, but insurance denial   - Mounjaro -- 16 lb WL, but pharmacy denial  Contraindicated  - Topamax: contraindicated dhx of kidney stone.  - Phentermine: contraindicated by hx of palpitations.    Obesity Surgery: Pt does not meet criteria based on BMI.    Medical conditions:  Glaucoma: no  Seizures: no  Medullary thyroid cancer (personal or family hx): Father and PGM both had thyroid goiter. Multiple Endocrine Neoplasia: no  Palpitations/Tachycardia: YES  Chest Pain: no past history of MI.   Headaches/Migraines: YES  Nephrolithiasis: YES  H/o pancreatitis: YES, gallstone pancreatitis and she had cholecystectomy.  GERD: YES    Prior Surgeries:  Choleycystectomy: YES  Hysterectomy: YES    Birth Control Methods: N/A; s/p hysterectomy.  Return in about 3 months (around 01/20/2022) for Weight clinic F/U one slot. mounjaro samples, cirrhosis. .    I have reviewed and addressed the patient???s adherence and response to prescribed medications. I have identified patient barriers to following the proposed medication and treatment plan, and have noted opportunities to optimize healthy behaviors. I have answered the patient???s questions to satisfaction and the patient voices understanding.    Time: Greater than 50% of this encounter was spent in direct consultation with the patient in evaluation and discussing all of the above. Duration of encounter: 30 minutes.     HPI:     Tonya Brady is a 46 y.o. year old female who has a  has a past medical history of Bulging lumbar disc (2005), Cirrhosis of liver (CMS-HCC), Depression, Diabetes mellitus (CMS-HCC), Difficult intravenous access, Fibromyalgia (03/09/2013), NASH (nonalcoholic steatohepatitis), Secondary Sjogren's syndrome (CMS-HCC) (03/09/2013), Systemic lupus erythematosus (CMS-HCC) (01/19/2011), and Valvular regurgitation. who presents today for [facility] follow up.    Weight Management History    Wt Readings from Last 6 Encounters:   10/20/21 75.8 kg (167 lb 1.9 oz)   09/14/21 75.7 kg (166 lb 14.4 oz)   07/19/21 78.9 kg (174 lb)   07/18/21 81.5 kg (179 lb 9.6 oz)   04/26/21 74.8 kg (165 lb)   04/19/21 75.1 kg (165 lb 8 oz)         11/04/2019     2:37 PM 01/07/2020    10:34 AM 07/13/2020     1:02 PM 12/07/2020    10:14 AM 02/17/2021     1:50 PM 07/19/2021     9:00 AM 10/20/2021     9:05 AM   Waist Circumference   Waist Circumference 39 inches 38.75 inches 35.75 inches 36 inches 37 inches 36 inches 32.5 inches          Update: 8 lb loss in 3 mos.  Medication: Tonya Brady for 3 months from samples. Unable to get through pharmacy. Taking wellbutrin and naltrexone as directed. Tolerating well.   Eating Pattern: Reports doing well with food choices.   B: protein shake  S: yogurt, fruit  L: salad, [place], bread  S: yogurt, fruit  D: chick breast, roasted veggies  Physical Activity: walking 4-5x/week, going to the gym 1-2x/week  Barrier: Pt has had to start working a second FT job which she reports helps her stress    Lab Results   Component Value Date    LDL Calculated 101 (H) 03/03/2020    LDL Cholesterol, Calculated 104 06/23/2011    HDL 44 03/03/2020    HDL 35 (L) 06/23/2011    Hemoglobin A1C 5.4 03/03/2020    Hemoglobin A1C 5.9 02/10/2016    Hemoglobin A1C 5.5 06/23/2011    Glucose 93 07/18/2021    TSH 2.500 11/10/2015    TSH 1.92 06/23/2011    Vitamin D Total (25OH) 28.8 09/14/2021    AST 16 07/18/2021    AST 19 07/26/2010 ALT 12 07/18/2021    ALT 33 07/26/2010    Alkaline Phosphatase 42 (L) 07/18/2021     Past Medical/Surgical History:     Past Medical History:   Diagnosis Date   ??? Bulging lumbar disc 2005   ??? Cirrhosis of liver (CMS-HCC)    ??? Depression    ??? Diabetes mellitus (CMS-HCC)    ??? Difficult intravenous access    ??? Fibromyalgia 03/09/2013   ??? NASH (nonalcoholic steatohepatitis)    ??? Secondary Sjogren's syndrome (CMS-HCC) 03/09/2013  Secondary to SLE  Diagnosed: 12/2003 (by sx's, parotid gland enlargement, and serologies) Pathology: biopsy not done Labs: + ANA, anti-SSA, anti-SSB, RF    ??? Systemic lupus erythematosus (CMS-HCC) 01/19/2011   ??? Valvular regurgitation      Past Surgical History:   Procedure Laterality Date   ??? CARPAL TUNNEL RELEASE     ??? CESAREAN SECTION     ??? CHOLECYSTECTOMY     ??? HERNIA REPAIR     ??? HYSTERECTOMY     ??? PR REVISE MEDIAN N/CARPAL TUNNEL SURG Left 12/30/2018    Procedure: R16 NEUROPLASTY AND/OR TRANSPOSITION; MEDIAN NERVE AT CARPAL TUNNEL;  Surgeon: Theodora Blow Jacqlyn Krauss, MD;  Location: ASC OR [facility];  Service: Orthopedics   ??? PR SALIVARY SURG UNLISTED PROC Right 12/06/2018    Procedure: R22 UNLISTED PROC SALIVARY GLANDS/DUCTS;  Surgeon: Michelle Piper, MD;  Location: ASC OR [facility];  Service: ENT   ??? PR SALIVARY SURG UNLISTED PROC Left 02/18/2019    Procedure: R25 UNLISTED PROC SALIVARY GLANDS/DUCTS;  Surgeon: Michelle Piper, MD;  Location: ASC OR [facility];  Service: ENT   ??? PR UPPER GI ENDOSCOPY,DIAGNOSIS N/A 05/10/2016    Procedure: UGI ENDO, INCLUDE ESOPHAGUS, STOMACH, & DUODENUM &/OR JEJUNUM; DX W/WO COLLECTION SPECIMN, BY BRUSH OR WASH;  Surgeon: Carmon Ginsberg, MD;  [facility]: GI [facility];  Service: Gastroenterology   ??? PR UPPER GI ENDOSCOPY,DIAGNOSIS N/A 05/02/2019    Procedure: UGI ENDO, INCLUDE ESOPHAGUS, STOMACH, & DUODENUM &/OR JEJUNUM; DX W/WO COLLECTION SPECIMN, BY BRUSH OR WASH;  Surgeon: Chriss Driver, MD;  Location: GI [facility]URES [facility];  Service: Gastroenterology   ??? PR UPPER GI ENDOSCOPY,DIAGNOSIS N/A 04/26/2021    Procedure: UGI ENDO, INCLUDE ESOPHAGUS, STOMACH, & DUODENUM &/OR JEJUNUM; DX W/WO COLLECTION SPECIMN, BY BRUSH OR WASH;  Surgeon: Pia Mau, MD;  Location: GI PROCEDURES [facility];  Service: Gastroenterology   ??? TOTAL ABDOMINAL HYSTERECTOMY     ??? TUBAL LIGATION         Social History:     Social History     Socioeconomic History   ??? Marital status: Married     Spouse name: None   ??? Number of children: None   ??? Years of education: None   ??? Highest education level: None   Tobacco Use   ??? Smoking status: Never   ??? Smokeless tobacco: Never   Vaping Use   ??? Vaping Use: Never used   Substance and Sexual Activity   ??? Alcohol use: No     Alcohol/week: 0.0 standard drinks   ??? Drug use: No   ??? Sexual activity: Not Currently     Partners: Male   Other Topics Concern   ??? Do you use sunscreen? Yes   ??? Tanning bed use? No   ??? Are you easily burned? No   ??? Excessive sun exposure? No   ??? Blistering sunburns? No       Family History:     Family History   Problem Relation Age of Onset   ??? Cancer Mother    ??? Hypertension Mother    ??? Heart disease Father    ??? Heart attack Father    ??? Diabetes Maternal Grandmother    ??? Hypertension Maternal Grandmother    ??? Thyroid disease Maternal Grandmother    ??? Breast cancer Maternal Grandmother    ??? Breast cancer Paternal Grandmother    ??? Diabetes Paternal Grandmother    ??? Thyroid disease Paternal Grandmother    ???  Depression Son    ??? Mental illness Son    ??? Breast cancer Maternal Aunt    ??? Aneurysm Maternal Aunt    ??? Diabetes Maternal Aunt    ??? Diabetes Maternal Uncle    ??? Heart attack Maternal Uncle    ??? Melanoma Neg Hx    ??? Basal cell carcinoma Neg Hx    ??? Squamous cell carcinoma Neg Hx    ??? Drug abuse Neg Hx    ??? Stroke Neg Hx        Allergies:     Aspirin, Fish containing products, Iodine, and Shellfish containing products    Current Medications:     Current Outpatient Medications   Medication Sig Dispense Refill   ??? belimumab (BENLYSTA) 200 mg/mL AtIn Inject the contents of 1 injector (200mg ) under the skin every 7 days 4 mL 2   ??? buPROPion (WELLBUTRIN XL) 300 MG 24 hr tablet Take 1 tablet (300 mg total) by mouth every morning. 60 tablet 2   ??? famotidine (PEPCID) 40 MG tablet TAKE 1 TABLET(40 MG) BY MOUTH EVERY NIGHT AS NEEDED FOR HEARTBURN 90 tablet 3   ??? fluticasone propionate (FLONASE) 50 mcg/actuation nasal spray 1 spray into each nostril daily. 16 g 11   ??? hydrOXYchloroQUINE (PLAQUENIL) 200 mg tablet Take 1 tablet (200 mg total) by mouth Two (2) times a day. 180 tablet 1   ??? ibuprofen (MOTRIN) 800 MG tablet ibuprofen 800 mg tablet     ??? naltrexone (DEPADE) 50 mg tablet Take 0.5 tablets (25 mg total) by mouth two (2) times a day. 60 tablet 2   ??? tirzepatide (MOUNJARO) 5 mg/0.5 mL PnIj Inject 5 mg under the skin once a week. 2 mL 3   ??? empty container Misc Use as directed 1 each 2     No current facility-administered medications for this visit.       I have reviewed and (if needed) updated the patient's problem list, medications, allergies, past medical and surgical history, social and family history.    ROS:     Unless otherwise stated in the HPI:  CONSTITUTIONAL: no fever chills.   HEENT: Eyes: No diplopia or blurred vision. ENT: No earache, sore throat or runny nose.   CARDIOVASCULAR: No pressure, squeezing, strangling, tightness, heaviness or aching about the chest, neck, axilla or epigastrium.   RESPIRATORY: No cough, shortness of breath, PND or orthopnea.   GASTROINTESTINAL: No nausea, vomiting or diarrhea.   GENITOURINARY: No dysuria, frequency or urgency.   MUSCULOSKELETAL: no new pains, no joint swelling or redness.   SKIN: No change in skin, hair or nails.   NEUROLOGIC: No paresthesias, fasciculations, seizures or weakness.   PSYCHIATRIC: No disorder of thought or mood.   ENDOCRINE: No heat or cold intolerance, polyuria or polydipsia.   HEMATOLOGICAL: No easy bruising or bleeding.    Vital Signs:     Body mass index is 32.64 kg/m??.  Waist Circumference: 32.5 inches     Wt Readings from Last 3 Encounters:   10/20/21 75.8 kg (167 lb 1.9 oz)   09/14/21 75.7 kg (166 lb 14.4 oz)   07/19/21 78.9 kg (174 lb)     Temp Readings from Last 3 Encounters:   10/20/21 36.4 ??C (97.6 ??F)   09/14/21 36.2 ??C (97.2 ??F) (Tympanic)   07/19/21 35.9 ??C (96.7 ??F) (Temporal)     BP Readings from Last 3 Encounters:   10/20/21 113/75   09/14/21 105/65   07/19/21 113/76  Pulse Readings from Last 3 Encounters:   10/20/21 64   09/14/21 65   07/19/21 68         11/04/2019     2:37 PM 01/07/2020    10:34 AM 07/13/2020     1:02 PM 12/07/2020    10:14 AM 02/17/2021     1:50 PM 07/19/2021     9:00 AM 10/20/2021     9:05 AM   Waist Circumference   Waist Circumference 39 inches 38.75 inches 35.75 inches 36 inches 37 inches 36 inches 32.5 inches     Physical Exam:     General: well appearing, in NAD, Body mass index is 32.64 kg/m??. Ambulatory without help.  Body fat distribution: central & gluteofemoral adiposity. No supraclavicular adiposity. No dorsal adiposity. Waist Circumference: 32.5 inches     Head: normocephalic atraumatic.  Eyes: PERRLA, EOMI, Sclera WNL.   Oral pharynx: moist, no exudate, no erythema, not enlarged. Good dental hygiene. Moderate OP crowding.  Neck: supple, no LAD, no thyromegaly, no bruit.  CV: RRR no rubs or murmurs. Peripheral edema: none.  Lungs: clear bilaterally to auscultation. No wheezing.  Abdomen: soft, NT/ND. No intertrigo. Moderate pannus. No hepatomegaly.   Extremities: no clubbing, cyanosis, No edema.  Skin: no concerning lesions, rashes observed. No lipomas, no acanthosis nigricans.  Neuro: Alert and oriented X 3.    Labs:     No visits with results within 1 Month(s) from this visit.   Latest known visit with results is:   Office Visit on 09/14/2021   Component Date Value Ref Range Status   ??? PT 09/14/2021 13.8 (H)  9.8 - 12.8 sec Final   ??? INR 09/14/2021 1.21   Final   ??? AFP-Tumor Marker 09/14/2021 <2  <=8 ng/mL Final   ??? Vitamin B-12 09/14/2021 360  211 - 911 pg/ml Final   ??? Vitamin D Total (25OH) 09/14/2021 28.8  20.0 - 80.0 ng/mL Final       Follow-up:     Recommend well adult check/physical in 1 year. Otherwise, follow up as below.  Return in about 3 months (around 01/20/2022) for Weight clinic F/U one slot. mounjaro samples, cirrhosis. Marland Kitchen    Health maintenance reviewed and recommendations made based on [place] Preventative Task Force (USPTF) recommendations. Reviewed appropriate diet and exercise. Patient stated understanding and there were no barriers to learning.     I attest that I, Constance Goltz, personally documented this note while acting as scribe for Marshell Garfinkel, MD.      Constance Goltz, Scribe.  10/20/2021     The documentation recorded by the scribe accurately reflects the service I personally performed and the decisions made by me.    Marshell Garfinkel, MD

## 2021-10-20 NOTE — Unmapped (Addendum)
[name] is a 46 y.o. 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 Her medical history includes SLE, cirrhosis, GERD, suspected OSA, depression, and emotional / stress eating. Pt wishes weight loss to prevent further liver damage.    Weight Summary:  Starting weight/BMI/WC: 209 lb, BMI 39.6 (06/13/19, self report), WC 39 (11/04/19, 1st measure)  Target weight/goal: BMI 30 or 159 lb.  Today's weight/BMI: 75.8 kg (167 lb 1.9 oz), BMI (!) 32.64 (10/20/2021)   % Body weight loss: 16.7%  Today's Waist Circumference: 32.5 inches (10/20/2021) <= 38.75 (01/07/20)  Today's visit number: 11th  Duration in program: 28 mo    Referred by: [name], MD in [facility].    GOALS: 1) Increase going to gym and cont walking daily  2) Cont healthy eating pattern 3) Cont Mounjaro    I have reviewed the patient's medical history, lifestyle history and labs/tests.   My recommendations include the following:    Lifestyle Pattern Summary: Encouraged Pt to continue good eating habits and regular physical activity. Enouraged Pt to increase resistance training at the gym.  - See GOALS above.     Weight gain causing medication: None.   Hx of long term steroid use (prednisone 80 mg daily for lupus).  Hx of Ambien for insomnia years ago.    Medication: Taking naltrexone, and Wellbutrin XR as directed. Tolerating well. Denies s/e. Pt has been unable to get [facility] through the pharmacy. Due to liver cirrhosis and previous good weight loss, we would like to continue Mounjaro. Provided Pt with samples of Mounjaro 2.5 mg x3 (Lot #[id number] exp 5/24). Pt verbalized understanding and agreement with plan.    CONTINUE Mounjaro (tirzepatide) 2.5 mg once weekly injections, CONTINUE Wellbutrin XL 300 mg once daily, Naltrexone 25 mg BID.    - Wellbutrin: pt was started on this for depression before enrolling at our office. ?weight loss on it.  - Naltrexone: started on 06/13/19 at 209 lb.  Tried  - Orlistat: tried and failed.  - Ozempic -- good WL, but insurance denial   - Mounjaro -- 16 lb WL, but pharmacy denial  Contraindicated  - Topamax: contraindicated dhx of kidney stone.  - Phentermine: contraindicated by hx of palpitations.    Obesity Surgery: Pt does not meet criteria based on BMI.    Medical conditions:  Glaucoma: no  Seizures: no  Medullary thyroid cancer (personal or family hx): Father and PGM both had thyroid goiter. Multiple Endocrine Neoplasia: no  Palpitations/Tachycardia: YES  Chest Pain: no past history of MI.   Headaches/Migraines: YES  Nephrolithiasis: YES  H/o pancreatitis: YES, gallstone pancreatitis and she had cholecystectomy.  GERD: YES    Prior Surgeries:  Choleycystectomy: YES  Hysterectomy: YES    Birth Control Methods: N/A; s/p hysterectomy.

## 2021-11-08 DIAGNOSIS — M329 Systemic lupus erythematosus, unspecified: Principal | ICD-10-CM

## 2021-11-08 DIAGNOSIS — M3500 Sicca syndrome, unspecified: Principal | ICD-10-CM

## 2021-11-08 MED ORDER — HYDROXYCHLOROQUINE 200 MG TABLET
ORAL_TABLET | Freq: Two times a day (BID) | ORAL | 3 refills | 90 days | Status: CP
Start: 2021-11-08 — End: ?

## 2021-11-08 NOTE — Unmapped (Signed)
HCQ refill  Last Visit Date: 07/18/2021  Next Visit Date: 02/06/2022

## 2021-11-09 NOTE — Unmapped (Signed)
[facility] Refill Coordination Note    Specialty Medication(s) to be Shipped:   Inflammatory Disorders: Benlysta    Other medication(s) to be shipped: No additional medications requested for fill at this time     Tonya Brady, DOB: 1976/01/05  Phone: [phone] (home)       All above HIPAA information was verified with patient.     Was a Nurse, learning disability used for this call? No    Completed refill call assessment today to schedule patient's medication shipment from the [facility] [phone]).  All relevant notes have been reviewed.     Specialty medication(s) and dose(s) confirmed: Regimen is correct and unchanged.   Changes to medications: [name] reports no changes at this time.  Changes to insurance: No  New side effects reported not previously addressed with a pharmacist or physician: None reported  Questions for the pharmacist: No    Confirmed patient received a Conservation officer, historic buildings and a Surveyor, mining with first shipment. The patient will receive a drug information handout for each medication shipped and additional FDA Medication Guides as required.       DISEASE/MEDICATION-SPECIFIC INFORMATION        For patients on injectable medications: Patient currently has 0 doses left.  Next injection is scheduled for 11/10/2021.    SPECIALTY MEDICATION ADHERENCE     Medication Adherence    Patient reported X missed doses in the last month: 0  Specialty Medication: Benlysta  Patient is on additional specialty medications: No  Any gaps in refill history greater than 2 weeks in the last 3 months: no  Demonstrates understanding of importance of adherence: yes  Informant: patient  Reliability of informant: reliable  Confirmed plan for next specialty medication refill: delivery by pharmacy  Refills needed for supportive medications: not needed              Were doses missed due to medication being on hold? No    Benlysta 200mg /ml: Patient has 0 days of medication on hand    REFERRAL TO PHARMACIST     Referral to the pharmacist: Not needed      [facility]     Shipping address confirmed in Epic.     Delivery Scheduled: Yes, Expected medication delivery date: 11/09/2021.     Medication will be delivered via Same Day Courier to the prescription address in Epic WAM.    [name]   [name] Specialty Technician

## 2021-11-28 NOTE — Unmapped (Signed)
[facility] Refill Coordination Note    Specialty Medication(s) to be Shipped:   Inflammatory Disorders: Benlysta    Other medication(s) to be shipped: No additional medications requested for fill at this time     Tonya Brady, DOB: Jan 10, 1976  Phone: [phone] (home)       All above HIPAA information was verified with patient.     Was a Nurse, learning disability used for this call? No    Completed refill call assessment today to schedule patient's medication shipment from the [facility] [phone]).  All relevant notes have been reviewed.     Specialty medication(s) and dose(s) confirmed: Regimen is correct and unchanged.   Changes to medications: Salinda reports no changes at this time.  Changes to insurance: No  New side effects reported not previously addressed with a pharmacist or physician: None reported  Questions for the pharmacist: No    Confirmed patient received a Conservation officer, historic buildings and a Surveyor, mining with first shipment. The patient will receive a drug information handout for each medication shipped and additional FDA Medication Guides as required.       DISEASE/MEDICATION-SPECIFIC INFORMATION        For patients on injectable medications: Patient currently has 1 doses left.  Next injection is scheduled for 7/13.    SPECIALTY MEDICATION ADHERENCE     Medication Adherence    Patient reported X missed doses in the last month: 0  Specialty Medication: Benlysta  Patient is on additional specialty medications: No  Patient is on more than two specialty medications: No  Any gaps in refill history greater than 2 weeks in the last 3 months: no  Demonstrates understanding of importance of adherence: yes  Informant: patient              Were doses missed due to medication being on hold? No    Benlysta 200mg /ml: Patient has 7 days of medication on hand     REFERRAL TO PHARMACIST     Referral to the pharmacist: Not needed      [facility]     Shipping address confirmed in Epic. Delivery Scheduled: Yes, Expected medication delivery date: 7/13.     Medication will be delivered via Same Day Courier to the prescription address in Epic WAM.    [name]   [facility] Specialty Technician

## 2021-12-01 MED FILL — BENLYSTA 200 MG/ML SUBCUTANEOUS AUTO-INJECTOR: SUBCUTANEOUS | 28 days supply | Qty: 4 | Fill #1

## 2021-12-21 NOTE — Unmapped (Signed)
[facility] Clinical Assessment & Refill Coordination Note    Tonya Brady, DOB: 03-27-1976  Phone: [phone] (home)     All above HIPAA information was verified with patient.     Was a Nurse, learning disability used for this call? No    Specialty Medication(s):   Inflammatory Disorders: Benlysta     Current Outpatient Medications   Medication Sig Dispense Refill   ??? belimumab (BENLYSTA) 200 mg/mL AtIn Inject the contents of 1 injector (200mg ) under the skin every 7 days 4 mL 2   ??? buPROPion (WELLBUTRIN XL) 300 MG 24 hr tablet Take 1 tablet (300 mg total) by mouth every morning. 60 tablet 2   ??? famotidine (PEPCID) 40 MG tablet TAKE 1 TABLET(40 MG) BY MOUTH EVERY NIGHT AS NEEDED FOR HEARTBURN 90 tablet 3   ??? fluticasone propionate (FLONASE) 50 mcg/actuation nasal spray 1 spray into each nostril daily. 16 g 11   ??? hydrOXYchloroQUINE (PLAQUENIL) 200 mg tablet Take 1 tablet (200 mg total) by mouth Two (2) times a day. 180 tablet 3   ??? ibuprofen (MOTRIN) 800 MG tablet ibuprofen 800 mg tablet     ??? naltrexone (DEPADE) 50 mg tablet Take 0.5 tablets (25 mg total) by mouth two (2) times a day. 60 tablet 2   ??? tirzepatide (MOUNJARO) 5 mg/0.5 mL PnIj Inject 5 mg under the skin once a week. 2 mL 3     No current facility-administered medications for this visit.        Changes to medications: [name] reports no changes at this time.    Allergies   Allergen Reactions   ??? Aspirin Swelling     Other reaction(s): SWELLING/EDEMA   ??? Fish Containing Products Diarrhea and Nausea And Vomiting   ??? Iodine Other (See Comments)     Pt told not to have d/t shellfish allergy   ??? Shellfish Containing Products Hives, Diarrhea and Nausea Only     Other reaction(s): HIVES  Other reaction(s): HIVES  Other reaction(s): NAUSEA       Changes to allergies: No    SPECIALTY MEDICATION ADHERENCE       Medication Adherence    Patient reported X missed doses in the last month: 0  Specialty Medication: Benlysta  Patient is on additional specialty medications: No  Informant: patient          Specialty medication(s) dose(s) confirmed: Regimen is correct and unchanged.     Are there any concerns with adherence? No    Adherence counseling provided? Not needed    CLINICAL MANAGEMENT AND INTERVENTION      Clinical Benefit Assessment:    Do you feel the medicine is effective or helping your condition? Yes, it helps the pain and swelling in joints    Clinical Benefit counseling provided? Not needed    Adverse Effects Assessment:    Are you experiencing any side effects? No    Are you experiencing difficulty administering your medicine? No    Quality of Life Assessment:    Quality of Life    Rheumatology  Oncology  Dermatology  Cystic Fibrosis          How many days over the past month did your SLE  keep you from your normal activities? For example, brushing your teeth or getting up in the morning. having a rash now    Have you discussed this with your provider? patient will reach out requesting clobetason ointment she has used in the past  Acute Infection Status:    Acute infections noted within Epic:  No active infections  Patient reported infection: None    Therapy Appropriateness:    Is therapy appropriate and patient progressing towards therapeutic goals? Yes, therapy is appropriate and should be continued    DISEASE/MEDICATION-SPECIFIC INFORMATION      For patients on injectable medications: Patient currently has 1 doses left.  Next injection is scheduled for 8/3.    PATIENT SPECIFIC NEEDS     - Does the patient have any physical, cognitive, or cultural barriers? No    - Is the patient high risk? No    - Does the patient require a Care Management Plan? No     SOCIAL DETERMINANTS OF HEALTH     At the [facility], we have learned that life circumstances - like trouble affording food, housing, utilities, or transportation can affect the health of many of our patients.   That is why we wanted to ask: are you currently experiencing any life circumstances that are negatively impacting your health and/or quality of life? No    Social Determinants of Psychologist, prison and probation services Strain: Not on file   Internet Connectivity: Not on file   Food Insecurity: Not on file   Tobacco Use: Low Risk    ??? Smoking Tobacco Use: Never   ??? Smokeless Tobacco Use: Never   ??? Passive Exposure: Not on file   Housing/Utilities: Not on file   Alcohol Use: Not on file   Transportation Needs: Not on file   Substance Use: Not on file   Health Literacy: Low Risk    ??? : Never   Physical Activity: Not on file   Interpersonal Safety: Not on file   Stress: Not on file   Intimate Partner Violence: Not on file   Depression: Not on file   Social Connections: Not on file       Would you be willing to receive help with any of the needs that you have identified today? No       SHIPPING     Specialty Medication(s) to be Shipped:   Inflammatory Disorders: Benlysta    Other medication(s) to be shipped: No additional medications requested for fill at this time     Changes to insurance: No    Delivery Scheduled: Yes, Expected medication delivery date: 8/4.     Medication will be delivered via Same Day Courier to the confirmed prescription address in [facility].    The patient will receive a drug information handout for each medication shipped and additional FDA Medication Guides as required.  Verified that patient has previously received a Conservation officer, historic buildings and a Surveyor, mining.    The patient or caregiver noted above participated in the development of this care plan and knows that they can request review of or adjustments to the care plan at any time.      All of the patient's questions and concerns have been addressed.    Julianne Rice   [facility] Shared [facility] Specialty Pharmacist

## 2021-12-22 MED ORDER — PREDNISONE 5 MG TABLET
ORAL_TABLET | ORAL | 0 refills | 12 days | Status: CP
Start: 2021-12-22 — End: 2022-01-03

## 2021-12-22 MED ORDER — TRIAMCINOLONE ACETONIDE 0.1 % TOPICAL CREAM
Freq: Two times a day (BID) | TOPICAL | 0 refills | 0 days | Status: CP
Start: 2021-12-22 — End: 2022-12-22

## 2021-12-22 NOTE — Unmapped (Signed)
Called patient in response to [employer]. She reports an erythematous rash on her neck, chest, back, and arms after sun exposure. She otherwise denies alopecia, oral/nasal ulcers, or joint pain/stiffness. Will call patient in a short prednisone taper for photosensitive skin rash, along with triamcinolone 0.1%

## 2021-12-23 NOTE — Unmapped (Signed)
Tonya Brady 's Benlysta shipment will be delayed as a result of the medication is too soon to refill until 12/29/2021.     I have reached out to the patient  at [phone] and communicated the delivery change. We will reschedule the medication for the delivery date that the patient agreed upon.  We have confirmed the delivery date as 12/29/2021, via same day courier.

## 2021-12-29 ENCOUNTER — Ambulatory Visit
Admit: 2021-12-29 | Discharge: 2021-12-30 | Payer: PRIVATE HEALTH INSURANCE | Attending: Student in an Organized Health Care Education/Training Program | Primary: Student in an Organized Health Care Education/Training Program

## 2021-12-29 DIAGNOSIS — B9689 Other specified bacterial agents as the cause of diseases classified elsewhere: Principal | ICD-10-CM

## 2021-12-29 DIAGNOSIS — R070 Pain in throat: Principal | ICD-10-CM

## 2021-12-29 DIAGNOSIS — J019 Acute sinusitis, unspecified: Principal | ICD-10-CM

## 2021-12-29 MED ORDER — AMOXICILLIN 875 MG-POTASSIUM CLAVULANATE 125 MG TABLET
ORAL_TABLET | Freq: Two times a day (BID) | ORAL | 0 refills | 7 days | Status: CP
Start: 2021-12-29 — End: 2022-01-05

## 2021-12-29 NOTE — Unmapped (Signed)
ASSESSMENT/PLAN:     Pt with sinusitis. Augmentin prescribed. Pt advised on signs and symptoms that would warrant returning to Urgent Care or Emergency Department. Pt voices understanding. Please return to Urgent Care or Emergency Department if symptoms persist or worsen.      Diagnosis ICD-10-CM Associated Orders   1. Acute bacterial sinusitis  J01.90 POCT Rapid Influenza A/B, RSV, SARS-CoV2 NAA    B96.89 amoxicillin-clavulanate (AUGMENTIN) 875-125 mg per tablet      2. Throat pain  R07.0 POCT Rapid Strep A NAA        Requested Prescriptions     Signed Prescriptions Disp Refills    amoxicillin-clavulanate (AUGMENTIN) 875-125 mg per tablet 14 tablet 0     Sig: Take 1 tablet by mouth Two (2) times a day for 7 days.     Instructions about new medications and side effects provided.  If any changes in chronic medications then new reconciled medication list is given to patient.     CHIEF COMPLAINT:     Chief Complaint   Patient presents with    Sore Throat     Accompanied by headache, body aches x 3 days,       SUBJECTIVE/HPI:   46 y.o. Female presents with sinus pain, sore throat and myalgias x3 days. Pt rates pain 5/10. Pt denies fever.    Past Medical History:   Diagnosis Date    Bulging lumbar disc 2005    Cirrhosis of liver (CMS-HCC)     Depression     Diabetes mellitus (CMS-HCC)     Difficult intravenous access     Fibromyalgia 03/09/2013    NASH (nonalcoholic steatohepatitis)     Secondary Sjogren's syndrome (CMS-HCC) 03/09/2013    Secondary to SLE  Diagnosed: 12/2003 (by sx's, parotid gland enlargement, and serologies) Pathology: biopsy not done Labs: + ANA, anti-SSA, anti-SSB, RF     Systemic lupus erythematosus (CMS-HCC) 01/19/2011    Valvular regurgitation      Allergies   Allergen Reactions    Aspirin Swelling     Other reaction(s): SWELLING/EDEMA    Fish Containing Products Diarrhea and Nausea And Vomiting    Iodine Other (See Comments)     Pt told not to have d/t shellfish allergy    Shellfish Containing Products Hives, Diarrhea and Nausea Only     Other reaction(s): HIVES  Other reaction(s): HIVES  Other reaction(s): NAUSEA     Social History     Socioeconomic History    Marital status: Married     Spouse name: None    Number of children: None    Years of education: None    Highest education level: None   Tobacco Use    Smoking status: Never    Smokeless tobacco: Never   Vaping Use    Vaping Use: Never used   Substance and Sexual Activity    Alcohol use: No     Alcohol/week: 0.0 standard drinks of alcohol    Drug use: No    Sexual activity: Not Currently     Partners: Male   Other Topics Concern    Do you use sunscreen? Yes    Tanning bed use? No    Are you easily burned? No    Excessive sun exposure? No    Blistering sunburns? No     Social Determinants of Health     Physical Activity: Insufficiently Active (11/14/2019)    Exercise Vital Sign     Days of Exercise per Week: 3 days  Minutes of Exercise per Session: 30 min   Stress: Stress Concern Present (11/14/2019)    [employer] of Occupational Health - Occupational Stress Questionnaire     Feeling of Stress : To some extent     Family History   Problem Relation Age of Onset    Cancer Mother     Hypertension Mother     Heart disease Father     Heart attack Father     Diabetes Maternal Grandmother     Hypertension Maternal Grandmother     Thyroid disease Maternal Grandmother     Breast cancer Maternal Grandmother     Breast cancer Paternal Grandmother     Diabetes Paternal Grandmother     Thyroid disease Paternal Grandmother     Depression Son     Mental illness Son     Breast cancer Maternal Aunt     Aneurysm Maternal Aunt     Diabetes Maternal Aunt     Diabetes Maternal Uncle     Heart attack Maternal Uncle     Melanoma Neg Hx     Basal cell carcinoma Neg Hx     Squamous cell carcinoma Neg Hx     Drug abuse Neg Hx     Stroke Neg Hx        ROS:   Review of Systems   Constitutional:  Negative for fever.   HENT:  Positive for sinus pressure, sinus pain and sore throat. Negative for congestion and ear pain.    Eyes:  Negative for redness.   Respiratory:  Negative for cough and shortness of breath.    Cardiovascular:  Negative for chest pain.   Gastrointestinal:  Negative for abdominal pain, diarrhea, nausea and vomiting.   Genitourinary:  Negative for dysuria, frequency and urgency.   Musculoskeletal:  Positive for myalgias.   Skin:  Negative for rash.     See Subjective/HPI  Medications, Allergies and Problem List personally reviewed in Epic today    OBJECTIVE:          12/29/21 1211   BP: 115/72   Pulse: 64   Resp: 18   Temp: 36.9 ??C (98.5 ??F)   Weight: 73.5 kg (162 lb)   Height: 152.4 cm (5')   PainSc: 5    PainLoc: Throat     Physical Exam  Vitals reviewed.   Constitutional:       General: She is not in acute distress.     Appearance: Normal appearance.   HENT:      Head: Normocephalic.      Right Ear: Tympanic membrane, ear canal and external ear normal.      Left Ear: Tympanic membrane, ear canal and external ear normal.      Nose:      Right Sinus: Maxillary sinus tenderness and frontal sinus tenderness present.      Left Sinus: Maxillary sinus tenderness and frontal sinus tenderness present.      Mouth/Throat:      Pharynx: Oropharynx is clear.   Eyes:      Conjunctiva/sclera: Conjunctivae normal.   Cardiovascular:      Rate and Rhythm: Normal rate and regular rhythm.      Heart sounds: Normal heart sounds.   Pulmonary:      Effort: Pulmonary effort is normal. No respiratory distress.      Breath sounds: Normal breath sounds.   Skin:     General: Skin is warm and dry.   Neurological:  Mental Status: She is alert.          No results found.     LABS/X-RAYS/EKG/MEDS:       Results for orders placed or performed in visit on 12/29/21   POCT Rapid Strep A NAA   Result Value Ref Range    Rapid Strep A NAA, POC Negative Negative    Rapid Strep A NAA Internal QC, POC QC Acceptable     Rapid Strep a NAA Lot, POC [id number]     Rapid Strep A NAA Expiration Date, POC 10/15/2022     INSTR S/N Strep POCT [id number]    POCT Rapid Influenza A/B, RSV, SARS-CoV2 NAA   Result Value Ref Range    POCT SARS-CoV-2 NAA Negative Negative    Rapid Influenza A NAA, POC Negative Negative    Rapid Influenza B NAA, POC Negative Negative    Rapid RSV NAA, POC Negative Negative    POC FLURVID COMMENT       This test uses Cepheid Xpert SARS-CoV-2/Flu/RSV PCR assay; FDA has granted Emergency Use Authorization for this test.       No results found.       PHQ-2 Score: 0    PHQ-9 Score:      Screening complete, no depression identified / no further action needed today        A copy of these instructions have been given to the patient or responsible adult who demonstrated the ability to learn, asked appropriate questions, and verbalized understanding of the plan of care.  There were no barriers to learning identified.    Please take the time to sign up for a MyChart Account. See sign up information in your After Visit Summary. You will be able to view lab results, make appointments, communicate with providers, and much more.

## 2021-12-30 DIAGNOSIS — M329 Systemic lupus erythematosus, unspecified: Principal | ICD-10-CM

## 2021-12-30 MED ORDER — BENLYSTA 200 MG/ML SUBCUTANEOUS AUTO-INJECTOR
SUBCUTANEOUS | 2 refills | 28 days | Status: CP
Start: 2021-12-30 — End: ?
  Filled 2021-12-29: qty 4, 28d supply, fill #2
  Filled 2022-01-31: qty 4, 28d supply, fill #0

## 2021-12-30 NOTE — Unmapped (Signed)
Benlysta refill  Last Visit Date: 07/18/2021  Next Visit Date: 02/06/2022    Lab Results   Component Value Date    ALT 12 07/18/2021    AST 16 07/18/2021    ALBUMIN 3.8 07/18/2021    CREATININE 0.64 07/18/2021     Lab Results   Component Value Date    WBC 2.8 (L) 07/18/2021    HGB 12.7 07/18/2021    HCT 38.4 07/18/2021    PLT 134 (L) 07/18/2021     Lab Results   Component Value Date    NEUTROPCT 56.0 07/18/2021    LYMPHOPCT 26.0 07/18/2021    MONOPCT 15.8 07/18/2021    EOSPCT 1.8 07/18/2021    BASOPCT 0.4 07/18/2021

## 2022-01-26 NOTE — Unmapped (Signed)
[facility] Refill Coordination Note    Specialty Medication(s) to be Shipped:   Inflammatory Disorders: Benlysta    Other medication(s) to be shipped: No additional medications requested for fill at this time     Tonya Brady, DOB: 09-22-1975  Phone: [phone] (home)       All above HIPAA information was verified with patient.     Was a Nurse, learning disability used for this call? No    Completed refill call assessment today to schedule patient's medication shipment from the [facility] [phone]).  All relevant notes have been reviewed.     Specialty medication(s) and dose(s) confirmed: Regimen is correct and unchanged.   Changes to medications: Tonya Brady reports no changes at this time.  Changes to insurance: No  New side effects reported not previously addressed with a pharmacist or physician: None reported  Questions for the pharmacist: No    Confirmed patient received a Conservation officer, historic buildings and a Surveyor, mining with first shipment. The patient will receive a drug information handout for each medication shipped and additional FDA Medication Guides as required.       DISEASE/MEDICATION-SPECIFIC INFORMATION        For patients on injectable medications: Patient currently has 0 doses left.  Next injection is scheduled for 9/14.    SPECIALTY MEDICATION ADHERENCE     Medication Adherence    Patient reported X missed doses in the last month: 0  Specialty Medication: Benlysta  Patient is on additional specialty medications: No  Patient is on more than two specialty medications: No  Any gaps in refill history greater than 2 weeks in the last 3 months: no  Demonstrates understanding of importance of adherence: yes  Informant: patient                          Were doses missed due to medication being on hold? No    Benlysta 200mg /ml: Patient has 0 days of medication on hand    REFERRAL TO PHARMACIST     Referral to the pharmacist: Not needed      [facility]     Shipping address confirmed in Epic.     Delivery Scheduled: Yes, Expected medication delivery date: 9/12.     Medication will be delivered via Same Day Courier to the prescription address in Epic [facility].    [name]   [facility] Specialty Technician

## 2022-01-31 MED ORDER — CLOBETASOL 0.05 % TOPICAL OINTMENT
1 refills | 0 days | Status: CP
Start: 2022-01-31 — End: ?

## 2022-01-31 MED ORDER — PREDNISONE 10 MG TABLET
ORAL_TABLET | ORAL | 0 refills | 16 days | Status: CP
Start: 2022-01-31 — End: 2022-02-16

## 2022-01-31 NOTE — Unmapped (Signed)
Spoke to patient regarding [employer]. Her rash, which resembles acute cutaneous lupus [place] has not resolved with the Triamcinolone ointment or prednisone taper starting at 20 mg daily. In 2017, when she was evaluated by dermatology, she was given Clobetasol 0.05 % ointment to use from the face down with good results. Will call in Clobetasol ointment as well as higher prednisone taper starting at 40 mg daily to see if this leads to improvement in her rash.

## 2022-02-06 ENCOUNTER — Ambulatory Visit: Admit: 2022-02-06 | Discharge: 2022-02-07 | Payer: PRIVATE HEALTH INSURANCE

## 2022-02-06 DIAGNOSIS — M329 Systemic lupus erythematosus, unspecified: Principal | ICD-10-CM

## 2022-02-06 LAB — CBC W/ AUTO DIFF
BASOPHILS ABSOLUTE COUNT: 0 10*9/L (ref 0.0–0.1)
BASOPHILS RELATIVE PERCENT: 0.1 %
EOSINOPHILS ABSOLUTE COUNT: 0 10*9/L (ref 0.0–0.5)
EOSINOPHILS RELATIVE PERCENT: 0.1 %
HEMATOCRIT: 41.5 % (ref 34.0–44.0)
HEMOGLOBIN: 14.1 g/dL (ref 11.3–14.9)
LYMPHOCYTES ABSOLUTE COUNT: 0.3 10*9/L — ABNORMAL LOW (ref 1.1–3.6)
LYMPHOCYTES RELATIVE PERCENT: 3.7 %
MEAN CORPUSCULAR HEMOGLOBIN CONC: 33.8 g/dL (ref 32.0–36.0)
MEAN CORPUSCULAR HEMOGLOBIN: 31.2 pg (ref 25.9–32.4)
MEAN CORPUSCULAR VOLUME: 92.3 fL (ref 77.6–95.7)
MEAN PLATELET VOLUME: 9.9 fL (ref 6.8–10.7)
MONOCYTES ABSOLUTE COUNT: 0.3 10*9/L (ref 0.3–0.8)
MONOCYTES RELATIVE PERCENT: 3.4 %
NEUTROPHILS ABSOLUTE COUNT: 8.1 10*9/L — ABNORMAL HIGH (ref 1.8–7.8)
NEUTROPHILS RELATIVE PERCENT: 92.7 %
NUCLEATED RED BLOOD CELLS: 0 /100{WBCs} (ref <=4)
PLATELET COUNT: 171 10*9/L (ref 150–450)
RED BLOOD CELL COUNT: 4.5 10*12/L (ref 3.95–5.13)
RED CELL DISTRIBUTION WIDTH: 13.1 % (ref 12.2–15.2)
WBC ADJUSTED: 8.8 10*9/L (ref 3.6–11.2)

## 2022-02-06 LAB — COMPREHENSIVE METABOLIC PANEL
ALBUMIN: 4 g/dL (ref 3.4–5.0)
ALKALINE PHOSPHATASE: 45 U/L — ABNORMAL LOW (ref 46–116)
ALT (SGPT): 24 U/L (ref 10–49)
ANION GAP: 10 mmol/L (ref 5–14)
AST (SGOT): 12 U/L (ref <=34)
BILIRUBIN TOTAL: 0.3 mg/dL (ref 0.3–1.2)
BLOOD UREA NITROGEN: 9 mg/dL (ref 9–23)
BUN / CREAT RATIO: 12
CALCIUM: 9.3 mg/dL (ref 8.7–10.4)
CHLORIDE: 110 mmol/L — ABNORMAL HIGH (ref 98–107)
CO2: 22.8 mmol/L (ref 20.0–31.0)
CREATININE: 0.73 mg/dL
EGFR CKD-EPI (2021) FEMALE: >90 mL/min/{1.73_m2} (ref >=60)
GLUCOSE RANDOM: 105 mg/dL (ref 70–179)
POTASSIUM: 4.2 mmol/L (ref 3.4–4.8)
PROTEIN TOTAL: 7.3 g/dL (ref 5.7–8.2)
SODIUM: 143 mmol/L (ref 135–145)

## 2022-02-06 LAB — URINALYSIS WITH MICROSCOPY WITH CULTURE REFLEX
BILIRUBIN UA: NEGATIVE
BLOOD UA: NEGATIVE
GLUCOSE UA: NEGATIVE
KETONES UA: NEGATIVE
LEUKOCYTE ESTERASE UA: NEGATIVE
NITRITE UA: NEGATIVE
PH UA: 6 (ref 5.0–9.0)
PROTEIN UA: NEGATIVE
RBC UA: 0 /HPF (ref 0–3)
SPECIFIC GRAVITY UA: 1.02 (ref 1.005–1.030)
SQUAMOUS EPITHELIAL: <1 /HPF (ref 0–5)
UROBILINOGEN UA: 0.2
WBC UA: 0 /HPF (ref 0–3)

## 2022-02-06 LAB — C3 COMPLEMENT: C3 COMPLEMENT: 82 mg/dL — ABNORMAL LOW (ref 90–170)

## 2022-02-06 LAB — C4 COMPLEMENT: C4 COMPLEMENT: 27.5 mg/dL (ref 12.0–36.0)

## 2022-02-06 MED ORDER — AZATHIOPRINE 50 MG TABLET
ORAL_TABLET | Freq: Every day | ORAL | 0 refills | 30 days | Status: CP
Start: 2022-02-06 — End: 2022-03-08

## 2022-02-06 NOTE — Unmapped (Addendum)
Dear Tonya Brady    It was nice to see you!     Today in clinic we discussed the following:     Labs and urine today to look at lupus activity   Start azathioprine 50 mg daily (take with biggest meal of the day)  Labs in about 2-3 weeks; if labs look good, plan to increase to 100 mg daily depending on how you tolerate it.  Continue Benlysta and Plaquenil    Please know you can reach out to me if you have any issues getting a medication (availability or cost) that I prescribe.    MyChart messages may take up to 48-72 hours for response and should not be used in an emergency.     Have a nice day!     Eliezer Lofts DO MPH  [facility]  [address]  [place], [place] [address]  Phone: [phone]  Fax: [phone]     Azathioprine (imuran) (Information from the [employer] of Rheumatology)    Azathioprine (Imuran) is a drug used in certain autoimmune conditions (diseases where the body???s natural defense system attacks itself). It suppresses the immune system by interfering with DNA synthesis (the creation of DNA molecules). It is used in dermatomyositis, systemic lupus erythematosus (lupus), inflammatory bowel disease, vasculitis (inflammation of the blood vessels), rheumatoid arthritis, as well as other inflammatory conditions.    It is also used in combination with other medications to suppress the immune system after organ transplantation to prevent rejection of transplanted organs.    How to Take It  Azathioprine is usually taken by mouth (in doses between 50 - 150 mg), once or divided twice daily. The initial dose for rheumatoid arthritis is approximately 1 milligram/kilogram (mg/kg) of body weight. The dose can be increased every 1 - 2 months, up to a maximum dose of 2.5 mg/kg of body weight, or approximately 75 to 150 mg given twice a day.    A benefit in arthritis or other conditions may appear as early as 6 - 8 weeks. It may take up to 12 weeks to notice a full effect.    Side Effects  The most common side effects of azathioprine can involve the gastrointestinal tract (which includes the stomach, intestines, liver, and pancreas) and the blood cells. Taking the medication twice daily instead of all at once, or taking it after eating, may help avoid these problems. Less often, azathioprine may cause damage to the liver, pancreas, or an allergic reaction that may include a flu-like illness or a rash. Azathioprine also can lower the number of infection-fighting white blood cells.    Before or during treatment, your doctor may perform a blood test called TPMT activity level. TPMT helps clear the medication from your system. If you have lower amounts of TPMT, you may be at higher risk for medication toxicity. It is important to take azathioprine as directed and have regular blood tests.    Long-term use of azathioprine in combination with other immune-suppressing medications in transplant patients has been associated with a slight elevated risk of cancer.    Tell Your Doctor  You should notify your doctor if you have these symptoms while taking this medication: fever, rash, easy bruising or bleeding, or signs of an infection. If vomiting occurs, you should contact your doctor, as this may be a sign of a serious reaction.    Be sure to tell your doctor about all of the medications you are taking,  which may include over-the-counter drugs and natural remedies. Medications that may interfere with azathioprine and potentially cause serious problems include the gout medication allopurinol (Aloprim, Zyloprim); warfarin (Coumadin); some blood pressure medications, including some angiotensin-converting enzyme (ACE) inhibitors (Accupril or Vasotec); olsalazine (Dipentum); mesalamine (Asacol, Pentasa); and sulfasalazine (Azulfidine).    Make sure to notify your other physicians while you are taking this drug. If you are pregnant or considering pregnancy, let your doctor know before starting this medication. Women should discuss birth control with their primary care physicians or gynecologists. Breast-feeding should be avoided while taking azathioprine because the drug can enter breast milk.    Be sure to talk with your doctor before receiving any vaccines or undergoing any surgeries while taking this medication. Live vaccines should be avoided while on this medication and you should discuss updating your vaccinations prior to starting this medication.     Updated March 2017 by Herma Ard, MD and reviewed by the [employer] of Rheumatology Committee on Communications and Marketing.    This information provided for general education only. Individuals should consult a qualified health care provider for professional medical advice, diagnosis and treatment of a medical or health condition.    ?? 2017 [employer] of Rheumatology

## 2022-02-06 NOTE — Unmapped (Signed)
RHEUMATOLOGY CLINIC FOLLOW-UP NOTE      Assessment/Plan:      SLE, Sjogrens  In brief, diagnosed 2005 with SLE and secondary Sjogrens which has been characterized by +ANA, +SSA, +SSB, +RF, leukopenia, rashes, arthritis, parotid gland swelling. Treated with HCQ, prior azathioprine (discontinued 2/2 nausea), MTX (discontinued 2/2 GI effects/NAFLD). Initiated belimumab 2020 for MSK pain. Since last visit, she reports worsening of photosensitive rashes, fatigue, joint pain/ swelling. We initially tried short taper of prednisone starting at 20 mg daily (with taper by 5 mg every 4 days) as well as triamcinolone for her acute cutaneous lupus rash without much improvement. Subsequently escalated to prednisone 40 mg daily with taper, as well as clobetasol for the body and triamcinolone for the face. She now reports improvement in the skin with these changes. Due to history of leukopenia, mild thrombocytopenia, rashes and joint symptoms, we discussed re-adding azathioprine to her regimen of HCQ and Belimumab at this time. She is amenable. We also discussed type 2 lupus symptoms, including low mood and fatigue. She is already on max dose of Wellbutrin 300 mg daily.  - SLE monitoring labs, as well as TPMT level   - Start Azathioprine 50mg  daily; increase to 100mg  pending TPMT level and ability to tolerate medication  - Continue HCQ 200mg  BID and subcutaneous belimumab weekly    Healthcare maintenance  - Vaccinations: UTD covid and bivalent vaccination  - HCQ: eye exam Jan 2022 reportedly normal. Overdue and encouraged to shedule this month     Return in about 4 months (around 06/08/2022).    This patient was seen and discussed with Dr.  Berton Lan  who agrees with the plan outlined above.    Rashmi Muthukkumar  PGY1    I have verified all resident documentation or findings.  I have personally performed or re-performed the physical exam and medical decision making.     Eliezer Lofts DO MPH  Rheumatology Fellow, worsened after she got her COVID vaccine later in February.     In early August, the patient developed rash over arms, chest, and [place]. This is the first time in many years that she has had significant rash related to her lupus from sun exposure. Usually the lesions are small, but these lesions were much larger and came up even in places that were not exposed to the sun. She was put on Triamcinolone with prednisone 20mg  taper, but this did not resolve her rash so therapy was switched to Clobetsol with high dose prednisone taper. Her rashes are improving today and are no longer open wounds.     Joint pain and swelling in her hands has been a consistent symptom for her. She has hand swelling that comes and goes, most recently last night where her fingers became so swollen she had indentations where her rings were. She has pain over her MCPs primarily and her thumb. No tingling in hands, but her hands and feet occasionally will feel warm. She will also have her fingers turn blue even when it is hot outside. She continues to have hand stiffness in the morning, but this is usually worse the day after she has been working heavily with her hands.     She is on [place] and initially felt that it helped, but since February she feels it is no longer helping. She feels her joint swelling and pain are worse and the recurrence of skin rashes which she has not had in years makes her feel  that this medication is no longer helping. In addition she has significant fatigue. She also feels this medication might be making it harder for her to clear other cold-like illnesses. She had a sinus infection in August and these have now lasted weeks whereas in the past they would resolve faster.     No oral ulcers, but gums will get raw when eating toast. Has had jaw surgery and pain in jaw and dry mouth has improved after this. No hematuria or frothy urine. She denies chest pain, but does have palpitations. Was told by PCP that she might have some afib, but has not been evaluated by cardiology. She has been more dizzy recently and finds that she will get dizzy when standing after bending down as well as sometimes when standing. She will feel the room spin around her and she will feel lightheaded. No LOC. Did have some ear pain with prior sinus infection, but this has resolved. She denies any history of anemia, but does note that she bruises easily and when she gets a cut it will bleed for a long time.     ROS:  Otherwise negative unless noted above in HPI.     Disease History:  - 2005 presented with bilateral partoid swelling, sicca, pancytopenia. Hospitalized with small pericardial effusion.  Eval at that time with +ANA 1:320 speckled, +SSA, +SSB, +RF 120, -dsDNA. Initiated HCQ.  - 2007 - 2010 continued PRN prednisone for parotid swelling. Concurrent intermittent antibiotics for concern for concurrent infections. Evaluated in ENT.  - 2009 trial of MTX for parotid swelling. Hospitalized for abdominal pain & elevated LFTs. MTX discontinued. After discontinuation of MTX developed worsening joint pain & swelling of hands.   - 2011 developed ACLE/malar rash after discontinuing HCQ. At that time labs were concerning for +ANA 1:640 speckled, -dsDNA. Restarted HCQ.  - 2012 concern for worsening arthritis pain. Initiated azathioprine while continued HCQ.  - 2013 patient self-discontinued medications for nausea.  - 2014 re-presented with malar & anterior throat rash for which HCQ was restarted.  - 2015-2017 lost to follow-up. Restarted HCQ. Concern for elevated LFTs for which she was evaluated by GI & underwent liver biopsy which confirmed NAFLD/fibrosis.  - 2018 - 2019 continued on HCQ. Concern for active synovitis on exam promtping steroid taper.   - 2020 belimumab started for persistent arthralgias, new rash. Underwent bilateral sialendoscopies which improved her salivary movement significantly.    - 2021-2022 continued belimumab, HCQ.    Treatment:  - Belimumab [2020 - current]  - HCQ [2005 - current; intermittent lapses in medication use - current]  - MTX [2009] - discontinued 2/2 severe GI side effects  - Azathioprine [2012 - 2013]  - Multiple prednisone tapers    Labs:  - As above [not all available dating back to 2005]  - 2019: +SSA/SSB, +RF 30, -CCP    Imaging:  - 2019: Xray hands: Nondisplaced fracture of the distal tuft of the left first distal phalanx. No evidence of erosive or inflammatory arthropathy.  - 2019: Xray L foot: No acute fracture or malalignment. Normal soft tissues.     PMH, PFH, PSH as previously documented.  Medications were reviewed this visit.    Objective:     Vitals:    02/06/22 1426   BP: 107/65   BP Site: L Arm   BP Position: Sitting   BP Cuff Size: Medium   Pulse: 76   Temp: 36.8 ??C (98.3 ??F)   TempSrc: Temporal   Weight: 75  kg (165 lb 6.4 oz)       Body mass index is 32.3 kg/m??.    Physical Exam  - GENERAL: WD/WN, in no acute distress  - HEAD: normocephalic, atraumatic  - EYES: no scleral icterus, EOMI  - ENT: MMM, no nasal or oral lesions, oropharynx without erythema. Saliva noted.  - NECK: supple, no palpable lymphadenopathy  - CARDIO: RRR, no murmurs  - PULM: CTAB, symmetric air movement  - NEURO: alert and oriented x 3; moving all extremities; strength 5/5 bilateral UE/LE  - SKIN: scattered light pink circular lesions of varying sizes over arms bilaterally as well as upper chest. (Photos in media tab)      - MSK:      Full ROM of bilateral shoulders, elbows, wrists, UE digits, knees, ankles. Mild swelling noted over 2nd MCP bilaterally with mild tenderness to palpation. Mild TTP over 3rd PIP on right hand. No PIP or DIP swelling. More stiffness and decreased ROM in right wrist compared to left, but not painful. Mild tenderness over elbows bilaterally, but shoulders, wrists, knees, ankles nonpainful. lymphadenopathy  - CARDIO: RRR, no murmurs  - PULM: CTAB, symmetric air movement  - NEURO: alert and oriented x 3; moving all extremities; strength 5/5 bilateral UE/LE  - SKIN: scattered light pink circular lesions of varying sizes over arms bilaterally as well as upper chest.                  - MSK:  Full ROM of bilateral shoulders, elbows, wrists, UE digits, knees, ankles. Mild swelling noted over 2nd MCP bilaterally with mild tenderness to palpation. Mild TTP over 3rd PIP on right hand. No PIP or DIP swelling. More stiffness and decreased ROM in right wrist compared to left, but not painful. Mild tenderness over elbows bilaterally, but shoulders, wrists, knees, ankles nonpainful.

## 2022-02-07 LAB — ANTI-DNA ANTIBODY, DOUBLE-STRANDED: DSDNA ANTIBODY: NEGATIVE

## 2022-02-13 LAB — THIOPURINE METHYLTRANFERASE
6-METHYLMERCAPTOPURINE RIBOSIDE: 6.46 nmol/mL/h
6-METHYLMERCAPTOPURINE: 4.61 nmol/mL/h
6-METHYLTHIOGUANINE RIBOSIDE: 3.44 nmol/mL/h

## 2022-02-13 NOTE — Unmapped (Signed)
Pt followed by hepatology closely. Reviewed relevant medications and labs. Pt is following our Weight Management Clinic. See Obesity tab for medication changes.

## 2022-02-13 NOTE — Unmapped (Addendum)
[name] is a 46 y.o. 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 Her medical history includes SLE, cirrhosis, GERD, suspected OSA, depression, and emotional / stress eating. Pt wishes weight loss to prevent further liver damage.    Weight Summary:  Starting weight/BMI/WC: 209 lb, BMI 39.6 (06/13/19, self report), WC 39 (11/04/19, 1st measure)  Target weight/goal: BMI 30 or 159 lb.  Today's weight/BMI: 75.3 kg (166 lb 0.6 oz), BMI (!) 32.43 (02/14/2022)   % Body weight loss: 20.6%  Today's Waist Circumference: 34 inches (02/14/2022) <= 38.75 (01/07/20)  Today's visit number: 12th  Duration in program: 32 mo    Referred by: [name], MD in [facility].    GOALS: 1) Cont gym and cont walking daily  2) Cont healthy eating pattern 3) Cont Mounjaro    I have reviewed the patient's medical history, lifestyle history and labs/tests.   My recommendations include the following:    Lifestyle Pattern Summary: Encouraged Pt to continue good eating habits and regular physical activity. Enouraged Pt to increase resistance training at the gym.  - See GOALS above.     Weight gain causing medication: None.   Hx of long term steroid use (prednisone 80 mg daily for lupus).  Hx of Ambien for insomnia years ago.    Medication: Taking naltrexone, and Wellbutrin XR as directed. Tolerating well. Denies s/e. Took Mounjaro as directed until running out of samples. Due to liver cirrhosis and previous good weight loss, we would like to continue Mounjaro. Provided Pt with samples of Mounjaro 2.5 mg x3 (2x Lot #[id number] exp 5/24, 1x Lot #[id number] exp 10/2022). Pt verbalized understanding and agreement with plan.    CONTINUE Mounjaro (tirzepatide) 2.5 mg once weekly injections, CONTINUE Wellbutrin XL 300 mg once daily, Naltrexone 25 mg BID.    - Wellbutrin: pt was started on this for depression before enrolling at our office. ?weight loss on it.  - Naltrexone: started on 06/13/19 at 209 lb.  Tried  - Orlistat: tried and failed.  - Ozempic -- good WL, but insurance denial   - Mounjaro -- 16 lb WL, but pharmacy denial  Contraindicated  - Topamax: contraindicated dhx of kidney stone.  - Phentermine: contraindicated by hx of palpitations.    Obesity Surgery: Pt does not meet criteria based on BMI.    Medical conditions:  Glaucoma: no  Seizures: no  Medullary thyroid cancer (personal or family hx): Father and PGM both had thyroid goiter. Multiple Endocrine Neoplasia: no  Palpitations/Tachycardia: YES  Chest Pain: no past history of MI.   Headaches/Migraines: YES  Nephrolithiasis: YES  H/o pancreatitis: YES, gallstone pancreatitis and she had cholecystectomy.  GERD: YES    Prior Surgeries:  Choleycystectomy: YES  Hysterectomy: YES    Birth Control Methods: N/A; s/p hysterectomy.

## 2022-02-13 NOTE — Unmapped (Signed)
[facility] Follow Up    Assessment/Plan:     Chief Complaint   Patient presents with    Follow-up     Wt mngmt       Problem List Items Addressed This Visit          Unprioritized    Liver cirrhosis secondary to NASH (nonalcoholic steatohepatitis) (CMS-HCC) - Primary     Pt followed by hepatology closely. Reviewed relevant medications and labs. Pt is following our [facility]. See Obesity tab for medication changes.     FIB-4 Calculation: 0.63 at 02/06/2022  4:10 PM  Calculated from:  SGOT/AST: 12 U/L at 02/06/2022  4:10 PM  SGPT/ALT: 24 U/L at 02/06/2022  4:10 PM  Platelets: 171 10*9/L at 02/06/2022  4:10 PM  Age: 46 years          Relevant Medications    tirzepatide (MOUNJARO) 2.5 mg/0.5 mL PnIj    Class 1 obesity     Ms. CHERINA SPARHAWK is a 46 y.o. 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 Her medical history includes SLE, cirrhosis, GERD, suspected OSA, depression, and emotional / stress eating. Pt wishes weight loss to prevent further liver damage.    Weight Summary:  Starting weight/BMI/WC: 209 lb, BMI 39.6 (06/13/19, self report), WC 39 (11/04/19, 1st measure)  Target weight/goal: BMI 30 or 159 lb.  Today's weight/BMI: 75.3 kg (166 lb 0.6 oz), BMI (!) 32.43 (02/14/2022)   % Body weight loss: 20.6%  Today's Waist Circumference: 34 inches (02/14/2022) <= 38.75 (01/07/20)  Today's visit number: 12th  Duration in program: 32 mo    Referred by: [name], MD in [facility] Medicine.    GOALS: 1) Cont gym and cont walking daily  2) Cont healthy eating pattern 3) Cont Mounjaro    I have reviewed the patient's medical history, lifestyle history and labs/tests.   My recommendations include the following:    Lifestyle Pattern Summary: Encouraged Pt to continue good eating habits and regular physical activity. Enouraged Pt to increase resistance training at the gym.  - See GOALS above.     Weight gain causing medication: None.   Hx of long term steroid use (prednisone 80 mg daily for lupus).  Hx of Ambien for insomnia years ago.    Medication: Taking naltrexone, and Wellbutrin XR as directed. Tolerating well. Denies s/e. Took Mounjaro as directed until running out of samples. Due to liver cirrhosis and previous good weight loss, we would like to continue Mounjaro. Provided Pt with samples of Mounjaro 2.5 mg x3 (2x Lot #[id number] exp 5/24, 1x Lot #[id number] exp 10/2022). Pt verbalized understanding and agreement with plan.    CONTINUE Mounjaro (tirzepatide) 2.5 mg once weekly injections, CONTINUE Wellbutrin XL 300 mg once daily, Naltrexone 25 mg BID.    - Wellbutrin: pt was started on this for depression before enrolling at our office. ?weight loss on it.  - Naltrexone: started on 06/13/19 at 209 lb.  Tried  - Orlistat: tried and failed.  - Ozempic -- good WL, but insurance denial   - Mounjaro -- 16 lb WL, but pharmacy denial  Contraindicated  - Topamax: contraindicated dhx of kidney stone.  - Phentermine: contraindicated by hx of palpitations.    Obesity Surgery: Pt does not meet criteria based on BMI.    Medical conditions:  Glaucoma: no  Seizures: no  Medullary thyroid cancer (personal or family hx): Father and PGM both  had thyroid goiter. Multiple Endocrine Neoplasia: no  Palpitations/Tachycardia: YES  Chest Pain: no past history of MI.   Headaches/Migraines: YES  Nephrolithiasis: YES  H/o pancreatitis: YES, gallstone pancreatitis and she had cholecystectomy.  GERD: YES    Prior Surgeries:  Choleycystectomy: YES  Hysterectomy: YES    Birth Control Methods: N/A; s/p hysterectomy.                 Return in about 4 months (around 06/16/2022) for Weight clinic F/U one slot. cirrhosis, mounjaor samples. .    I have reviewed and addressed the patient???s adherence and response to prescribed medications. I have identified patient barriers to following the proposed medication and treatment plan, and have noted opportunities to optimize healthy behaviors. I have answered the patient???s questions to satisfaction and the patient voices understanding.    Time: Greater than 50% of this encounter was spent in direct consultation with the patient in evaluation and discussing all of the above. Duration of encounter: 30 minutes.     HPI:     Tonya Brady is a 46 y.o. year old female who has a  has a past medical history of Bulging lumbar disc (2005), Cirrhosis of liver (CMS-HCC), Depression, Diabetes mellitus (CMS-HCC), Difficult intravenous access, Fibromyalgia (03/09/2013), NASH (nonalcoholic steatohepatitis), Secondary Sjogren's syndrome (CMS-HCC) (03/09/2013), Systemic lupus erythematosus (CMS-HCC) (01/19/2011), and Valvular regurgitation. who presents today for [facility] follow up.    Weight Management History    Wt Readings from Last 6 Encounters:   02/14/22 75.3 kg (166 lb 0.6 oz)   02/06/22 75 kg (165 lb 6.4 oz)   12/29/21 73.5 kg (162 lb)   10/20/21 75.8 kg (167 lb 1.9 oz)   09/14/21 75.7 kg (166 lb 14.4 oz)   07/19/21 78.9 kg (174 lb)         01/07/2020    10:34 AM 07/13/2020     1:02 PM 12/07/2020    10:14 AM 02/17/2021     1:50 PM 07/19/2021     9:00 AM 10/20/2021     9:05 AM 02/14/2022     9:52 AM   Waist Circumference   Waist Circumference 38.75 inches 35.75 inches 36 inches 37 inches 36 inches 32.5 inches 34 inches          Update: 1 lb loss in 4 mos.  Medication: [name] for 3 months from samples, has been out for 5 weeks. Taking wellbutrin and naltrexone as directed. Tolerating well.   Eating Pattern: Doing well with food choices.   B: apple or eggs (sometimes veggies)  S: carrots, apples, watermelon  L: salad, [place] on whole grain  S: fruit  D: [place] breast, green beans, grilled chicken, roasted veggies  Physical Activity: Walking, light gym 60-90 mins, daily.  Barrier: back pain    Lab Results   Component Value Date    LDL Calculated 101 (H) 03/03/2020    LDL Cholesterol, Calculated 104 06/23/2011    HDL 44 03/03/2020    HDL 35 (L) 06/23/2011 Hemoglobin A1C 5.4 03/03/2020    Hemoglobin A1C 5.9 02/10/2016    Hemoglobin A1C 5.5 06/23/2011    Glucose 105 02/06/2022    TSH 2.500 11/10/2015    TSH 1.92 06/23/2011    Vitamin D Total (25OH) 28.8 09/14/2021    AST 12 02/06/2022    AST 19 07/26/2010    ALT 24 02/06/2022    ALT 33 07/26/2010    Alkaline Phosphatase 45 (L) 02/06/2022  Past Medical/Surgical History:     Past Medical History:   Diagnosis Date    Bulging lumbar disc 2005    Cirrhosis of liver (CMS-HCC)     Depression     Diabetes mellitus (CMS-HCC)     Difficult intravenous access     Fibromyalgia 03/09/2013    NASH (nonalcoholic steatohepatitis)     Secondary Sjogren's syndrome (CMS-HCC) 03/09/2013    Secondary to SLE  Diagnosed: 12/2003 (by sx's, parotid gland enlargement, and serologies) Pathology: biopsy not done Labs: + ANA, anti-SSA, anti-SSB, RF     Systemic lupus erythematosus (CMS-HCC) 01/19/2011    Valvular regurgitation      Past Surgical History:   Procedure Laterality Date    CARPAL TUNNEL RELEASE      CESAREAN SECTION      CHOLECYSTECTOMY      HERNIA REPAIR      HYSTERECTOMY      PR REVISE MEDIAN N/CARPAL TUNNEL SURG Left 12/30/2018    Procedure: R16 NEUROPLASTY AND/OR TRANSPOSITION; MEDIAN NERVE AT CARPAL TUNNEL;  Surgeon: Theodora Blow Jacqlyn Krauss, MD;  Location: [facility];  Service: Orthopedics    PR SALIVARY SURG UNLISTED PROC Right 12/06/2018    Procedure: R22 UNLISTED PROC SALIVARY GLANDS/DUCTS;  Surgeon: Michelle Piper, MD;  Location: [facility] OR [facility];  Service: ENT    PR SALIVARY SURG UNLISTED PROC Left 02/18/2019    Procedure: R25 UNLISTED PROC SALIVARY GLANDS/DUCTS;  Surgeon: Michelle Piper, MD;  Location: [facility] OR [facility];  Service: ENT    PR UPPER GI ENDOSCOPY,DIAGNOSIS N/A 05/10/2016    Procedure: UGI ENDO, INCLUDE ESOPHAGUS, STOMACH, & DUODENUM &/OR JEJUNUM; DX W/WO COLLECTION SPECIMN, BY BRUSH OR WASH;  Surgeon: Carmon Ginsberg, MD;  Location: [facility];  Service: Gastroenterology    PR UPPER GI ENDOSCOPY,DIAGNOSIS N/A 05/02/2019    Procedure: UGI ENDO, INCLUDE ESOPHAGUS, STOMACH, & DUODENUM &/OR JEJUNUM; DX W/WO COLLECTION SPECIMN, BY BRUSH OR WASH;  Surgeon: Chriss Driver, MD;  Location: [facility];  Service: Gastroenterology    PR UPPER GI ENDOSCOPY,DIAGNOSIS N/A 04/26/2021    Procedure: UGI ENDO, INCLUDE ESOPHAGUS, STOMACH, & DUODENUM &/OR JEJUNUM; DX W/WO COLLECTION SPECIMN, BY BRUSH OR WASH;  Surgeon: Pia Mau, MD;  Location: [facility];  Service: Gastroenterology    TOTAL ABDOMINAL HYSTERECTOMY      TUBAL LIGATION         Social History:     Social History     Socioeconomic History    Marital status: Married     Spouse name: None    Number of children: None    Years of education: None    Highest education level: None   Tobacco Use    Smoking status: Never    Smokeless tobacco: Never   Vaping Use    Vaping Use: Never used   Substance and Sexual Activity    Alcohol use: No     Alcohol/week: 0.0 standard drinks of alcohol    Drug use: No    Sexual activity: Not Currently     Partners: Male   Other Topics Concern    Do you use sunscreen? Yes    Tanning bed use? No    Are you easily burned? No    Excessive sun exposure? No    Blistering sunburns? No     Social Determinants of Health     Financial Resource Strain: Low Risk  (10/15/2019)    Overall Financial Resource Strain (CARDIA)  Difficulty of Paying Living Expenses: Not hard at all   Food Insecurity: No Food Insecurity (10/15/2019)    Hunger Vital Sign     Worried About Running Out of Food in the Last Year: Never true     Ran Out of Food in the Last Year: Never true   Transportation Needs: No Transportation Needs (11/14/2019)    PRAPARE - Therapist, art (Medical): No     Lack of Transportation (Non-Medical): No   Physical Activity: Insufficiently Active (11/14/2019)    Exercise Vital Sign     Days of Exercise per Week: 3 days     Minutes of Exercise per Session: 30 min   Stress: Stress Concern Present (11/14/2019)    [employer] of Occupational Health - Occupational Stress Questionnaire     Feeling of Stress : To some extent   Social Connections: Unknown (11/14/2019)    Social Connection and Isolation Panel [NHANES]     Frequency of Communication with Friends and Family: Three times a week     Frequency of Social Gatherings with Friends and Family: Three times a week     Attends Religious Services: Never     Active Member of Clubs or Organizations: No     Attends Banker Meetings: Never       Family History:     Family History   Problem Relation Age of Onset    Cancer Mother     Hypertension Mother     Heart disease Father     Heart attack Father     Diabetes Maternal Grandmother     Hypertension Maternal Grandmother     Thyroid disease Maternal Grandmother     Breast cancer Maternal Grandmother     Breast cancer Paternal Grandmother     Diabetes Paternal Grandmother     Thyroid disease Paternal Grandmother     Depression Son     Mental illness Son     Breast cancer Maternal Aunt     Aneurysm Maternal Aunt     Diabetes Maternal Aunt     Diabetes Maternal Uncle     Heart attack Maternal Uncle     Melanoma Neg Hx     Basal cell carcinoma Neg Hx     Squamous cell carcinoma Neg Hx     Drug abuse Neg Hx     Stroke Neg Hx        Allergies:     Aspirin, Fish containing products, Iodine, and Shellfish containing products    Current Medications:     Current Outpatient Medications   Medication Sig Dispense Refill    azaTHIOprine (IMURAN) 50 mg tablet Take 1 tablet (50 mg total) by mouth daily. 30 tablet 0    belimumab (BENLYSTA) 200 mg/mL AtIn Inject the contents of 1 auto-injector (200 mg total) under the skin every seven (7) days. 4 mL 2    buPROPion (WELLBUTRIN XL) 300 MG 24 hr tablet Take 1 tablet (300 mg total) by mouth every morning. 60 tablet 2    clobetasoL (TEMOVATE) 0.05 % ointment Use clobetasol ointment twice daily from the neck down, stop after 2 weeks 60 g 1    famotidine (PEPCID) 40 MG tablet TAKE 1 TABLET(40 MG) BY MOUTH EVERY NIGHT AS NEEDED FOR HEARTBURN 90 tablet 3    fluticasone propionate (FLONASE) 50 mcg/actuation nasal spray 1 spray into each nostril daily. 16 g 11    hydrOXYchloroQUINE (PLAQUENIL) 200 mg tablet Take 1  tablet (200 mg total) by mouth Two (2) times a day. 180 tablet 3    ibuprofen (MOTRIN) 800 MG tablet ibuprofen 800 mg tablet      naltrexone (DEPADE) 50 mg tablet Take 0.5 tablets (25 mg total) by mouth two (2) times a day. 60 tablet 2    triamcinolone (KENALOG) 0.1 % cream Apply topically Two (2) times a day. Apply two times a day for 7-10 days 80 g 0    tirzepatide (MOUNJARO) 2.5 mg/0.5 mL PnIj Inject 0.5 mL (2.5 mg total) under the skin every seven (7) days. 2 mL 2     No current facility-administered medications for this visit.       I have reviewed and (if needed) updated the patient's problem list, medications, allergies, past medical and surgical history, social and family history.    ROS:     Unless otherwise stated in the HPI:  CONSTITUTIONAL: no fever chills.   HEENT: Eyes: No diplopia or blurred vision. ENT: No earache, sore throat or runny nose.   CARDIOVASCULAR: No pressure, squeezing, strangling, tightness, heaviness or aching about the chest, neck, axilla or epigastrium.   RESPIRATORY: No cough, shortness of breath, PND or orthopnea.   GASTROINTESTINAL: No nausea, vomiting or diarrhea.   GENITOURINARY: No dysuria, frequency or urgency.   MUSCULOSKELETAL: no new pains, no joint swelling or redness.   SKIN: No change in skin, hair or nails.   NEUROLOGIC: No paresthesias, fasciculations, seizures or weakness.   PSYCHIATRIC: No disorder of thought or mood.   ENDOCRINE: No heat or cold intolerance, polyuria or polydipsia.   HEMATOLOGICAL: No easy bruising or bleeding.    Vital Signs:     Body mass index is 32.43 kg/m??.  Waist Circumference: 34 inches     Wt Readings from Last 3 Encounters:   02/14/22 75.3 kg (166 lb 0.6 oz)   02/06/22 75 kg (165 lb 6.4 oz)   12/29/21 73.5 kg (162 lb)     Temp Readings from Last 3 Encounters:   02/14/22 35.8 ??C (96.5 ??F)   02/06/22 36.8 ??C (98.3 ??F) (Temporal)   12/29/21 36.9 ??C (98.5 ??F)     BP Readings from Last 3 Encounters:   02/14/22 110/68   02/06/22 107/65   12/29/21 115/72     Pulse Readings from Last 3 Encounters:   02/14/22 68   02/06/22 76   12/29/21 64         01/07/2020    10:34 AM 07/13/2020     1:02 PM 12/07/2020    10:14 AM 02/17/2021     1:50 PM 07/19/2021     9:00 AM 10/20/2021     9:05 AM 02/14/2022     9:52 AM   Waist Circumference   Waist Circumference 38.75 inches 35.75 inches 36 inches 37 inches 36 inches 32.5 inches 34 inches     Physical Exam:     General: well appearing, in NAD, Body mass index is 32.43 kg/m??. Ambulatory without help.  Body fat distribution: central & gluteofemoral adiposity. No supraclavicular adiposity. No dorsal adiposity. Waist Circumference: 34 inches     Head: normocephalic atraumatic.  Eyes: PERRLA, EOMI, Sclera WNL.   Oral pharynx: moist, no exudate, no erythema, not enlarged. Good dental hygiene. Moderate OP crowding.  Neck: supple, no LAD, no thyromegaly, no bruit.  CV: RRR no rubs or murmurs. Peripheral edema: none.  Lungs: clear bilaterally to auscultation. No wheezing.  Abdomen: soft, NT/ND. No intertrigo. Moderate pannus. No hepatomegaly.  Extremities: no clubbing, cyanosis, No edema.  Skin: no concerning lesions, rashes observed. No lipomas, no acanthosis nigricans.  Neuro: Alert and oriented X 3.    Labs:     Office Visit on 02/06/2022   Component Date Value Ref Range Status    C3 Complement 02/06/2022 82 (L)  90 - 170 mg/dL Final    C4 Complement 02/06/2022 27.5  12.0 - 36.0 mg/dL Final    dsDNA Ab 86/57/8469 Negative  Negative Final    Color, UA 02/06/2022 Yellow   Final    Clarity, UA 02/06/2022 Clear   Final    Specific Gravity, UA 02/06/2022 1.020  1.005 - 1.030 Final    pH, UA 02/06/2022 6.0  5.0 - 9.0 Final    Leukocyte Esterase, UA 02/06/2022 Negative  Negative Final    Nitrite, UA 02/06/2022 Negative  Negative Final    Protein, UA 02/06/2022 Negative  Negative Final    Glucose, UA 02/06/2022 Negative  Negative Final    Ketones, UA 02/06/2022 Negative  Negative Final    Urobilinogen, UA 02/06/2022 0.2 mg/dL  0.2 - 2.0 mg/dL Final    Bilirubin, UA 02/06/2022 Negative  Negative Final    Blood, UA 02/06/2022 Negative  Negative Final    RBC, UA 02/06/2022 0  0 - 3 /HPF Final    WBC, UA 02/06/2022 0  0 - 3 /HPF Final    Squam Epithel, UA 02/06/2022 <1  0 - 5 /HPF Final    Bacteria, UA 02/06/2022 Rare (A)  None Seen /HPF Final    Sodium 02/06/2022 143  135 - 145 mmol/L Final    Potassium 02/06/2022 4.2  3.4 - 4.8 mmol/L Final    Chloride 02/06/2022 110 (H)  98 - 107 mmol/L Final    CO2 02/06/2022 22.8  20.0 - 31.0 mmol/L Final    Anion Gap 02/06/2022 10  5 - 14 mmol/L Final    BUN 02/06/2022 9  9 - 23 mg/dL Final    Creatinine 62/95/2841 0.73  0.60 - 0.80 mg/dL Final    BUN/Creatinine Ratio 02/06/2022 12   Final    eGFR CKD-EPI (2021) Female 02/06/2022 >90  >=60 mL/min/1.12m2 Final    eGFR calculated with CKD-EPI 2021 equation in accordance with [employer] and [employer] of Nephrology Task Force recommendations.    Glucose 02/06/2022 105  70 - 179 mg/dL Final    Calcium 32/44/0102 9.3  8.7 - 10.4 mg/dL Final    Albumin 72/53/6644 4.0  3.4 - 5.0 g/dL Final    Total Protein 02/06/2022 7.3  5.7 - 8.2 g/dL Final    Total Bilirubin 02/06/2022 0.3  0.3 - 1.2 mg/dL Final    AST 03/47/4259 12  <=34 U/L Final    ALT 02/06/2022 24  10 - 49 U/L Final    Alkaline Phosphatase 02/06/2022 45 (L)  46 - 116 U/L Final    TPMT Intepretation 02/06/2022 SEE COMMENTS   Final    *Normal* In this whole blood sample, the profile of   activity of thiopurine methyltransferase using three   different substrates was normal or essentially normal.     -------------------ADDITIONAL INFORMATION-------------------  Liquid Chromatography-Tandem Mass Spectrometry (LC-MS/MS)  This test was developed and its performance characteristics   determined by [facility] in a manner consistent with CLIA   requirements. This test has not been cleared or approved by   the U.S. Food and Drug Administration.    6-Methylmercaptopurine 02/06/2022 4.61  3.00 - 6.66 nmol/mL/h Final  6-Methylmercaptopurine riboside 02/06/2022 6.46  5.04 - 9.57 nmol/mL/h Final    6-Methylthioguanine riboside 02/06/2022 3.44  2.70 - 5.84 nmol/mL/h Final    TPMT Reviewed By 02/06/2022 Paulette Blanch, PhD   Final       Test Performed by:  [facility]  [address] [place], [place], [place] [address]  Lab Director: Paul Dykes M.D. Ph.D.; CLIA# [id number]    WBC 02/06/2022 8.8  3.6 - 11.2 10*9/L Final    RBC 02/06/2022 4.50  3.95 - 5.13 10*12/L Final    HGB 02/06/2022 14.1  11.3 - 14.9 g/dL Final    HCT 30/86/5784 41.5  34.0 - 44.0 % Final    MCV 02/06/2022 92.3  77.6 - 95.7 fL Final    MCH 02/06/2022 31.2  25.9 - 32.4 pg Final    MCHC 02/06/2022 33.8  32.0 - 36.0 g/dL Final    RDW 69/62/9528 13.1  12.2 - 15.2 % Final    MPV 02/06/2022 9.9  6.8 - 10.7 fL Final    Platelet 02/06/2022 171  150 - 450 10*9/L Final    nRBC 02/06/2022 0  <=4 /100 WBCs Final    Neutrophils % 02/06/2022 92.7  % Final    Lymphocytes % 02/06/2022 3.7  % Final    Monocytes % 02/06/2022 3.4  % Final    Eosinophils % 02/06/2022 0.1  % Final    Basophils % 02/06/2022 0.1  % Final    Absolute Neutrophils 02/06/2022 8.1 (H)  1.8 - 7.8 10*9/L Final    Absolute Lymphocytes 02/06/2022 0.3 (L)  1.1 - 3.6 10*9/L Final    Absolute Monocytes 02/06/2022 0.3  0.3 - 0.8 10*9/L Final    Absolute Eosinophils 02/06/2022 0.0  0.0 - 0.5 10*9/L Final    Absolute Basophils 02/06/2022 0.0  0.0 - 0.1 10*9/L Final    Urine Culture, Comprehensive 02/06/2022 Mixed Urogenital Flora   Final       Follow-up:     Recommend well adult check/physical in 1 year. Otherwise, follow up as below.  Return in about 4 months (around 06/16/2022) for Weight clinic F/U one slot. cirrhosis, mounjaor samples. .    Health maintenance reviewed and recommendations made based on [place] Preventative Task Force (USPTF) recommendations. Reviewed appropriate diet and exercise. Patient stated understanding and there were no barriers to learning.     I attest that I, Constance Goltz, personally documented this note while acting as scribe for Marshell Garfinkel, MD.      Constance Goltz, Scribe.  02/14/2022     The documentation recorded by the scribe accurately reflects the service I personally performed and the decisions made by me.    Marshell Garfinkel, MD

## 2022-02-14 ENCOUNTER — Ambulatory Visit
Admit: 2022-02-14 | Discharge: 2022-02-15 | Payer: PRIVATE HEALTH INSURANCE | Attending: Family Medicine | Primary: Family Medicine

## 2022-02-14 DIAGNOSIS — K7581 Nonalcoholic steatohepatitis (NASH): Principal | ICD-10-CM

## 2022-02-14 DIAGNOSIS — K7460 Unspecified cirrhosis of liver: Principal | ICD-10-CM

## 2022-02-14 DIAGNOSIS — E669 Obesity, unspecified: Principal | ICD-10-CM

## 2022-02-22 ENCOUNTER — Ambulatory Visit: Admit: 2022-02-22 | Discharge: 2022-02-23 | Payer: PRIVATE HEALTH INSURANCE

## 2022-02-22 DIAGNOSIS — M329 Systemic lupus erythematosus, unspecified: Principal | ICD-10-CM

## 2022-02-22 LAB — COMPREHENSIVE METABOLIC PANEL
ALBUMIN: 3.8 g/dL (ref 3.4–5.0)
ALKALINE PHOSPHATASE: 40 U/L — ABNORMAL LOW (ref 46–116)
ALT (SGPT): 16 U/L (ref 10–49)
ANION GAP: 6 mmol/L (ref 5–14)
AST (SGOT): 12 U/L (ref <=34)
BILIRUBIN TOTAL: 0.7 mg/dL (ref 0.3–1.2)
BLOOD UREA NITROGEN: 11 mg/dL (ref 9–23)
BUN / CREAT RATIO: 13
CALCIUM: 9.4 mg/dL (ref 8.7–10.4)
CHLORIDE: 109 mmol/L — ABNORMAL HIGH (ref 98–107)
CO2: 28.6 mmol/L (ref 20.0–31.0)
CREATININE: 0.83 mg/dL — ABNORMAL HIGH
EGFR CKD-EPI (2021) FEMALE: 89 mL/min/{1.73_m2} (ref >=60)
GLUCOSE RANDOM: 88 mg/dL (ref 70–179)
POTASSIUM: 4.1 mmol/L (ref 3.4–4.8)
PROTEIN TOTAL: 6.5 g/dL (ref 5.7–8.2)
SODIUM: 144 mmol/L (ref 135–145)

## 2022-02-22 LAB — CBC W/ AUTO DIFF
BASOPHILS ABSOLUTE COUNT: 0 10*9/L (ref 0.0–0.1)
BASOPHILS RELATIVE PERCENT: 0.6 %
EOSINOPHILS ABSOLUTE COUNT: 0.1 10*9/L (ref 0.0–0.5)
EOSINOPHILS RELATIVE PERCENT: 2.5 %
HEMATOCRIT: 39.9 % (ref 34.0–44.0)
HEMOGLOBIN: 13.3 g/dL (ref 11.3–14.9)
LYMPHOCYTES ABSOLUTE COUNT: 1.3 10*9/L (ref 1.1–3.6)
LYMPHOCYTES RELATIVE PERCENT: 25.8 %
MEAN CORPUSCULAR HEMOGLOBIN CONC: 33.3 g/dL (ref 32.0–36.0)
MEAN CORPUSCULAR HEMOGLOBIN: 31.1 pg (ref 25.9–32.4)
MEAN CORPUSCULAR VOLUME: 93.3 fL (ref 77.6–95.7)
MEAN PLATELET VOLUME: 9.3 fL (ref 6.8–10.7)
MONOCYTES ABSOLUTE COUNT: 0.5 10*9/L (ref 0.3–0.8)
MONOCYTES RELATIVE PERCENT: 9.7 %
NEUTROPHILS ABSOLUTE COUNT: 3.1 10*9/L (ref 1.8–7.8)
NEUTROPHILS RELATIVE PERCENT: 61.4 %
NUCLEATED RED BLOOD CELLS: 0 /100{WBCs} (ref <=4)
PLATELET COUNT: 182 10*9/L (ref 150–450)
RED BLOOD CELL COUNT: 4.28 10*12/L (ref 3.95–5.13)
RED CELL DISTRIBUTION WIDTH: 13.3 % (ref 12.2–15.2)
WBC ADJUSTED: 5 10*9/L (ref 3.6–11.2)

## 2022-02-22 MED ORDER — AZATHIOPRINE 100 MG TABLET
ORAL_TABLET | Freq: Every day | ORAL | 1 refills | 90 days | Status: CP
Start: 2022-02-22 — End: 2022-08-21

## 2022-02-22 NOTE — Unmapped (Signed)
Called patient in regards to lab results. TPMT normal and labs reassuring. She has been able to tolerate azathioprine 50 mg daily. She denies nausea but has some reflux when she takes it at night. Her weight management doctor recommended she take her PPI twice daily to help with GERD. We will increase her azathioprine dosing to 100 mg daily and have her repeat labs in 2-4 weeks.

## 2022-03-01 MED FILL — BENLYSTA 200 MG/ML SUBCUTANEOUS AUTO-INJECTOR: SUBCUTANEOUS | 28 days supply | Qty: 4 | Fill #1

## 2022-03-01 NOTE — Unmapped (Signed)
[facility] Refill Coordination Note    Specialty Medication(s) to be Shipped:   Inflammatory Disorders: Benlysta    Other medication(s) to be shipped: No additional medications requested for fill at this time     Tonya Brady, DOB: 04-Jan-1976  Phone: [phone] (home)       All above HIPAA information was verified with patient.     Was a Nurse, learning disability used for this call? No    Completed refill call assessment today to schedule patient's medication shipment from the [facility] [phone]).  All relevant notes have been reviewed.     Specialty medication(s) and dose(s) confirmed: Regimen is correct and unchanged.   Changes to medications: Tonya Brady reports no changes at this time.  Changes to insurance: No  New side effects reported not previously addressed with a pharmacist or physician: None reported  Questions for the pharmacist: No    Confirmed patient received a Conservation officer, historic buildings and a Surveyor, mining with first shipment. The patient will receive a drug information handout for each medication shipped and additional FDA Medication Guides as required.       DISEASE/MEDICATION-SPECIFIC INFORMATION        For patients on injectable medications: Patient currently has 0 doses left.  Next injection is scheduled for 03/02/22.    SPECIALTY MEDICATION ADHERENCE     Medication Adherence    Patient reported X missed doses in the last month: 0  Specialty Medication: BENLYSTA 200 mg/mL  Patient is on additional specialty medications: No  Informant: patient                          Were doses missed due to medication being on hold? No    Benlysta 200 mg/ml: 0 days of medicine on hand       REFERRAL TO PHARMACIST     Referral to the pharmacist: Not needed      [facility]     Shipping address confirmed in Epic.     Delivery Scheduled: Yes, Expected medication delivery date: 03/01/22.     Medication will be delivered via Same Day Courier to the prescription address in Epic WAM.    Jasper Loser   [facility] Specialty Technician

## 2022-03-08 ENCOUNTER — Ambulatory Visit
Admit: 2022-03-08 | Discharge: 2022-03-09 | Payer: PRIVATE HEALTH INSURANCE | Attending: Family Medicine | Primary: Family Medicine

## 2022-03-08 DIAGNOSIS — D709 Neutropenia, unspecified: Principal | ICD-10-CM

## 2022-03-08 DIAGNOSIS — Z0000 Encounter for general adult medical examination without abnormal findings: Principal | ICD-10-CM

## 2022-03-08 DIAGNOSIS — Z23 Encounter for immunization: Principal | ICD-10-CM

## 2022-03-08 DIAGNOSIS — J069 Acute upper respiratory infection, unspecified: Principal | ICD-10-CM

## 2022-03-08 DIAGNOSIS — Z1211 Encounter for screening for malignant neoplasm of colon: Principal | ICD-10-CM

## 2022-03-08 NOTE — Unmapped (Signed)
C/w viral URI. Trial sudafed.  Can reach out next week if symptoms persist.

## 2022-03-08 NOTE — Unmapped (Signed)
Discussed age appropriate preventative measures.

## 2022-03-08 NOTE — Unmapped (Signed)
Patient in clinic for office visit, Boostrix and FluLaval IIV4 Pres-Free vaccine administered per protocol,NCIR reviewed and vaccine accuracy verified with teammates: Renold Genta, CMA, patient eligible to receive vacstock: Private Vaccine, vaccine administered Right Deltoid and Left Deltoid, pt tolerated well, no s/s of a reaction noted, VIS given to patient

## 2022-03-08 NOTE — Unmapped (Signed)
ANC normalized with imuran - follows rheum

## 2022-03-08 NOTE — Unmapped (Signed)
Assessment and Plan  Tonya Brady is a 46 y.o. female with a PMH as above who presents to the clinic for the following:    Problem List Items Addressed This Visit       Upper respiratory tract infection     C/w viral URI. Trial sudafed.  Can reach out next week if symptoms persist.         Annual physical exam - Primary     Discussed age appropriate preventative measures.         Relevant Orders    Lipid Panel    Neutropenia (CMS-HCC)     ANC normalized with imuran - follows rheum           Colon cancer screening     Will get through hepatology with next EV screening EGD          Other Visit Diagnoses       Need for diphtheria-tetanus-pertussis (Tdap) vaccine        Relevant Orders    Tdap vaccine greater than or equal to 7yo IM (Adacel or Boostrix)    Need for immunization against influenza        Relevant Orders    INFLUENZA VACCINE (QUAD) IM - 6 MO-ADULT - PF (Completed)            Return in about 1 year (around 03/09/2023) for Annual physical.    Alexandria Lodge, MD  Family Medicine  03/08/2022    Chief Complaint  Chief Complaint   Patient presents with    Annual Exam     No main concerns        HPI  Tonya Brady is a 46 y.o. female with a PMH as below who presents for the following:    Here today for CPE. Has been following rheum and weight clinic. On samples of low dose mounjaro which has been helping but felt higher doses were more effective. She typically is physically active with daily walks by a local park. Was involved in more intense exercise at a gym but has not gone in recent weeks due to a flare up in back pain.    URI - nasal/sinus congestion, nonproductive cough for the past 24 hours. Has been taking mucinex.  No fevers, chills, nasal drainage, dyspnea, chest pain/tightness/pressure, sinus pain, dental pain, sore throat, malaise, fatigue.    Review of Systems   Comprehensive ROS negative except above    PHYSICAL EXAM  BP 112/73  - Pulse 53  - Temp 35.9 ??C (96.6 ??F) (Temporal)  - Wt 74.8 kg (165 lb)  - BMI 32.22 kg/m??   General appearance: alert, cooperative, NAD   Head: normocephalic and atraumatic  Eyes: normal EOMs, PERRL, normal conjunctiva, no discharge  ENT: OP clear and moist, no tonsillar exudates or erythema, normal TMs and external ear canals, normal nasal passages  Neck: normal ROM, supple, no thyromegaly, no cervical/supraclavicular lymphadenopathy, no appreciable JVD  Lungs: normal work of breathing, good air entry and clear to auscultation bilaterally, no wheezes or crackles appreciated   Heart: regular rate and rhythm, S1, S2 normal, no murmur, click, rub or gallop   Abdomen: Abdomen soft, non-tender, non distended. No masses, no organomegaly. Bowel sounds present.  Extremities: extremities normal, atraumatic, no cyanosis or edema  NEURO: AAOx3, appropriately responds to questions, spontaneous movement of extremities, strength and sensation intact bilaterally, no facial asymmetry or CN deficit  Psych: normal mood and affect    PCMH Components:   I  have reviewed and addressed the patient???s adherence and response to prescribed medications. I have identified patient barriers to following the proposed medication and treatment plan, and have noted opportunities to optimize healthy behaviors. I have answered the patient???s questions to satisfaction and the patient voices understanding.

## 2022-03-08 NOTE — Unmapped (Signed)
Will get through hepatology with next EV screening EGD

## 2022-03-16 ENCOUNTER — Ambulatory Visit: Admit: 2022-03-16 | Discharge: 2022-03-16 | Payer: PRIVATE HEALTH INSURANCE

## 2022-03-16 DIAGNOSIS — J019 Acute sinusitis, unspecified: Principal | ICD-10-CM

## 2022-03-16 LAB — CBC W/ AUTO DIFF
BASOPHILS ABSOLUTE COUNT: 0 10*9/L (ref 0.0–0.1)
BASOPHILS RELATIVE PERCENT: 0.6 %
EOSINOPHILS ABSOLUTE COUNT: 0 10*9/L (ref 0.0–0.5)
EOSINOPHILS RELATIVE PERCENT: 0.8 %
HEMATOCRIT: 38 % (ref 34.0–44.0)
HEMOGLOBIN: 13 g/dL (ref 11.3–14.9)
LYMPHOCYTES ABSOLUTE COUNT: 0.5 10*9/L — ABNORMAL LOW (ref 1.1–3.6)
LYMPHOCYTES RELATIVE PERCENT: 15.6 %
MEAN CORPUSCULAR HEMOGLOBIN CONC: 34.2 g/dL (ref 32.0–36.0)
MEAN CORPUSCULAR HEMOGLOBIN: 31.5 pg (ref 25.9–32.4)
MEAN CORPUSCULAR VOLUME: 92 fL (ref 77.6–95.7)
MEAN PLATELET VOLUME: 8.9 fL (ref 6.8–10.7)
MONOCYTES ABSOLUTE COUNT: 0.4 10*9/L (ref 0.3–0.8)
MONOCYTES RELATIVE PERCENT: 12.1 %
NEUTROPHILS ABSOLUTE COUNT: 2.2 10*9/L (ref 1.8–7.8)
NEUTROPHILS RELATIVE PERCENT: 70.9 %
NUCLEATED RED BLOOD CELLS: 0 /100{WBCs} (ref <=4)
PLATELET COUNT: 176 10*9/L (ref 150–450)
RED BLOOD CELL COUNT: 4.13 10*12/L (ref 3.95–5.13)
RED CELL DISTRIBUTION WIDTH: 12.6 % (ref 12.2–15.2)
WBC ADJUSTED: 3 10*9/L — ABNORMAL LOW (ref 3.6–11.2)

## 2022-03-16 LAB — COMPREHENSIVE METABOLIC PANEL
ALBUMIN: 3.7 g/dL (ref 3.4–5.0)
ALKALINE PHOSPHATASE: 39 U/L — ABNORMAL LOW (ref 46–116)
ALT (SGPT): 16 U/L (ref 10–49)
ANION GAP: 4 mmol/L — ABNORMAL LOW (ref 5–14)
AST (SGOT): 20 U/L (ref <=34)
BILIRUBIN TOTAL: 0.4 mg/dL (ref 0.3–1.2)
BLOOD UREA NITROGEN: 11 mg/dL (ref 9–23)
BUN / CREAT RATIO: 12
CALCIUM: 9.1 mg/dL (ref 8.7–10.4)
CHLORIDE: 111 mmol/L — ABNORMAL HIGH (ref 98–107)
CO2: 28 mmol/L (ref 20.0–31.0)
CREATININE: 0.91 mg/dL
EGFR CKD-EPI (2021) FEMALE: 80 mL/min/{1.73_m2} (ref >=60)
GLUCOSE RANDOM: 88 mg/dL (ref 70–179)
POTASSIUM: 3.7 mmol/L (ref 3.4–4.8)
PROTEIN TOTAL: 6.7 g/dL (ref 5.7–8.2)
SODIUM: 143 mmol/L (ref 135–145)

## 2022-03-16 LAB — LIPID PANEL
CHOLESTEROL/HDL RATIO SCREEN: 3.5 (ref 1.0–4.5)
CHOLESTEROL: 137 mg/dL (ref <=200)
HDL CHOLESTEROL: 39 mg/dL — ABNORMAL LOW (ref 40–60)
LDL CHOLESTEROL CALCULATED: 86 mg/dL (ref 40–99)
NON-HDL CHOLESTEROL: 98 mg/dL (ref 70–130)
TRIGLYCERIDES: 59 mg/dL (ref 0–150)
VLDL CHOLESTEROL CAL: 11.8 mg/dL (ref 9–37)

## 2022-03-16 MED ORDER — AMOXICILLIN 500 MG CAPSULE
ORAL_CAPSULE | Freq: Two times a day (BID) | ORAL | 0 refills | 7 days | Status: CP
Start: 2022-03-16 — End: 2022-03-23

## 2022-03-17 NOTE — Unmapped (Unsigned)
[facility]  [facility]  [address]  [place], [place]  [address]  Ph: [phone]  Fax: [phone]     HEPATOLOGY RETURN VISIT PROGRESS NOTE    REFERRING PROVIDER: Alexandria Lodge, MD  PRIMARY CARE PROVIDER: Alexandria Lodge, MD    DATE OF SERVICE: 03/20/2022    ASSESSMENT AND PLAN:   Tonya Brady is a 46 y.o. 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 She established care with [facility] in 01/2016 when she was referred for elevated liver enzymes and findings of possible cirrhosis on ultrasound. She underwent liver biopsy on 03/2016 showing cirrhosis secondary to steatohepatitis without evidence of AIH. Her cirrhosis is well compensated with no history of ascites, gastroesophageal varices or hepatic encephalopathy. She is not being considered for transplant due to low MELD.     1. MASH Cirrhosis: Stable, asked her to continue weight loss efforts. She is motivated to do this.   -Stable labs from 02/2022, good liver function  -Continue weight loss and Mounjaro    2. EV Screening: Last done 04/2021 with no varices  -Repeat EGD due 04/2023, schedule with colonoscopy    3. HCC Screening: Last done 06/2021 with no lesions. Repeat U/S q6 months + AFP  -U/S due 12/2021, currently pending today  -AFP sent today    4. Health Maintenance:  -Osteoporosis: Increased risk of osteopenia in patients with cirrhosis. Recommend checking Vitamin D level as well as DEXA scan if abnormal.   -Cervical Cancer: Last pap smear done: s/p hysterectomy  -Mammogram: Last done: Never done, defer to PCP  -Colon Cancer: Due at age 40, to be done next year with EGD    5. Vaccinations: We recommend that patients have vaccinations to prevent various infections that can occur, especially in the setting of having underlying liver disease. The following vaccinations should be given:  -Hepatitis A: Vaccinated  -Hepatitis B: Vaccinated  -Influenza (yearly): 01/2021  -Pneumococcal: PPSV23 02/2019. Prevnar 20 (03/07/2021)  -Zoster: Shingrix (age > 50). Two doses given 2-6 months apart.   -SARS-CoV-2: J&J, Pfizer booster, bivalent Pfizer 06/24/2021    Return to clinic in 6 months        Georga Hacking. Sherryll Burger, MD  Assistant Professor of Medicine   [place] [facility] [place] [facility]  Division of Gastroenterology & Hepatology  p: [phone] - f: [phone]      I personally spent 45 minutes face-to-face and non-face-to-face in the care of this patient, which includes all pre, intra, and post visit time on the date of service.  All documented time was specific to the E/M visit and does not include any procedures that may have been performed.    Subjective   SUBJECTIVE:     CHIEF COMPLAINT: MASH Cirrhosis    HISTORY OF PRESENT ILLNESS:      Tonya Brady is a 46 y.o. 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    INTERVAL HISTORY:(since the time of the patient's last visit):    The patient was last seen by me on 09/14/21.       Objective   OBJECTIVE:   VITAL SIGNS: There were no vitals taken for this visit.  Wt Readings from Last 3 Encounters:   03/08/22 74.8 kg (165 lb)   02/14/22 75.3 kg (166  lb 0.6 oz)   02/06/22 75 kg (165 lb 6.4 oz)     PHYSICAL EXAM:  Constitutional: Alert, no acute distress, with good coloring  HENT: conjunctiva clear, anicteric, nares without discharge, neck supple  CV: Regular rate and rhythm  Lung: respirations even and unlabored  Abdomen: soft, non-distended, non-tender.  No ascites.   Extremities: No edema, well perfused  Neuro: No focal deficits. No asterixis.   Mental Status: Thought organized, appropriate affect, engaged in conversation    DIAGNOSTIC STUDIES:  I have reviewed all pertinent diagnostic studies, including:  Laboratory results:  Lab Results   Component Value Date    WBC 3.0 (L) 03/16/2022    HGB 13.0 03/16/2022 MCV 92.0 03/16/2022    PLT 176 03/16/2022    Lab Results   Component Value Date    NA 143 03/16/2022    K 3.7 03/16/2022    CL 111 (H) 03/16/2022    CREATININE 0.91 03/16/2022    GLU 88 03/16/2022    CALCIUM 9.1 03/16/2022      Lab Results   Component Value Date    ALBUMIN 3.7 03/16/2022    AST 20 03/16/2022    ALT 16 03/16/2022    ALKPHOS 39 (L) 03/16/2022    BILITOT 0.4 03/16/2022    Lab Results   Component Value Date    INR 1.21 09/14/2021        Lab Results   Component Value Date    AFPTM <2 09/14/2021    AFPTM 3 12/10/2020    AFPTM 2 06/14/2020    AFPTM 2 03/07/2019     Liver Biopsy 03/24/2016:  A: Liver, core biopsy  - Liver with bridging fibrosis/early cirrhosis (stage 3-4), mild steatosis, and changes consistent with steatohepatitis

## 2022-03-20 ENCOUNTER — Ambulatory Visit: Admit: 2022-03-20 | Discharge: 2022-03-20 | Payer: PRIVATE HEALTH INSURANCE

## 2022-03-20 DIAGNOSIS — K7460 Unspecified cirrhosis of liver: Principal | ICD-10-CM

## 2022-03-20 DIAGNOSIS — R928 Other abnormal and inconclusive findings on diagnostic imaging of breast: Principal | ICD-10-CM

## 2022-03-20 DIAGNOSIS — K7581 Nonalcoholic steatohepatitis (NASH): Principal | ICD-10-CM

## 2022-03-20 DIAGNOSIS — K7210 Chronic hepatic failure without coma: Principal | ICD-10-CM

## 2022-03-22 LAB — CBC W/ DIFFERENTIAL
BANDED NEUTROPHILS ABSOLUTE COUNT: 0 10*3/uL (ref 0.0–0.1)
BASOPHILS ABSOLUTE COUNT: 0 10*3/uL (ref 0.0–0.2)
BASOPHILS RELATIVE PERCENT: 0 %
EOSINOPHILS ABSOLUTE COUNT: 0 10*3/uL (ref 0.0–0.4)
EOSINOPHILS RELATIVE PERCENT: 1 %
HEMATOCRIT: 41.3 % (ref 34.0–46.6)
HEMOGLOBIN: 13.6 g/dL (ref 11.1–15.9)
IMMATURE GRANULOCYTES: 0 %
LYMPHOCYTES ABSOLUTE COUNT: 0.7 10*3/uL (ref 0.7–3.1)
LYMPHOCYTES RELATIVE PERCENT: 29 %
MEAN CORPUSCULAR HEMOGLOBIN CONC: 32.9 g/dL (ref 31.5–35.7)
MEAN CORPUSCULAR HEMOGLOBIN: 30.5 pg (ref 26.6–33.0)
MEAN CORPUSCULAR VOLUME: 93 fL (ref 79–97)
MONOCYTES ABSOLUTE COUNT: 0.3 10*3/uL (ref 0.1–0.9)
MONOCYTES RELATIVE PERCENT: 12 %
NEUTROPHILS ABSOLUTE COUNT: 1.5 10*3/uL (ref 1.4–7.0)
NEUTROPHILS RELATIVE PERCENT: 58 %
PLATELET COUNT: 174 10*3/uL (ref 150–450)
RED BLOOD CELL COUNT: 4.46 x10E6/uL (ref 3.77–5.28)
RED CELL DISTRIBUTION WIDTH: 11.4 % — ABNORMAL LOW (ref 11.7–15.4)
WHITE BLOOD CELL COUNT: 2.5 10*3/uL — CL (ref 3.4–10.8)

## 2022-03-22 LAB — BASIC METABOLIC PANEL
BLOOD UREA NITROGEN: 9 mg/dL (ref 6–24)
BUN / CREAT RATIO: 10 (ref 9–23)
CALCIUM: 9.3 mg/dL (ref 8.7–10.2)
CHLORIDE: 104 mmol/L (ref 96–106)
CO2: 22 mmol/L (ref 20–29)
CREATININE: 0.93 mg/dL (ref 0.57–1.00)
GLUCOSE: 83 mg/dL (ref 70–99)
POTASSIUM: 3.9 mmol/L (ref 3.5–5.2)
SODIUM: 142 mmol/L (ref 134–144)

## 2022-03-22 LAB — HEPATIC FUNCTION PANEL
ALBUMIN: 4.5 g/dL (ref 3.9–4.9)
ALKALINE PHOSPHATASE: 45 IU/L (ref 44–121)
ALT (SGPT): 14 IU/L (ref 0–32)
AST (SGOT): 19 IU/L (ref 0–40)
BILIRUBIN DIRECT: 0.13 mg/dL (ref 0.00–0.40)
BILIRUBIN TOTAL (MG/DL) IN SER/PLAS: 0.4 mg/dL (ref 0.0–1.2)
TOTAL PROTEIN: 6.8 g/dL (ref 6.0–8.5)

## 2022-03-22 LAB — PROTIME-INR
INR: 1.1 (ref 0.9–1.2)
PROTHROMBIN TIME: 11.8 s (ref 9.1–12.0)

## 2022-03-22 LAB — AFP TUMOR MARKER: AFP-TUMOR MARKER: <1.8 ng/mL (ref 0.0–6.4)

## 2022-03-24 ENCOUNTER — Ambulatory Visit: Admit: 2022-03-24 | Discharge: 2022-03-25 | Payer: PRIVATE HEALTH INSURANCE

## 2022-03-24 DIAGNOSIS — R928 Other abnormal and inconclusive findings on diagnostic imaging of breast: Principal | ICD-10-CM

## 2022-03-28 MED ORDER — BUPROPION HCL XL 300 MG 24 HR TABLET, EXTENDED RELEASE
ORAL_TABLET | Freq: Every morning | ORAL | 2 refills | 0.00000 days
Start: 2022-03-28 — End: 2023-03-28

## 2022-03-28 NOTE — Unmapped (Signed)
Patient is requesting the following refill  Requested Prescriptions     Pending Prescriptions Disp Refills    buPROPion (WELLBUTRIN XL) 300 MG 24 hr tablet 60 tablet 2     Sig: Take 1 tablet (300 mg total) by mouth every morning.       Recent Visits  Date Type Provider Dept   03/08/22 Office Visit Alexandria Lodge, MD [facility] [address]   02/14/22 Office Visit Ro, Honor Junes, MD [place] [facility] [address] [facility]   10/20/21 Office Visit Ro, Honor Junes, MD [facility] [facility] [address] [facility]   07/19/21 Office Visit Ro, Honor Junes, MD [place] [facility] [address] [facility]   04/19/21 Office Visit Ro, Honor Junes, MD [facility] [address]   Showing recent visits within past 365 days with a meds authorizing provider and meeting all other requirements  Future Appointments  Date Type Provider Dept   06/20/22 Appointment Ro, Honor Junes, MD  Family Medicine [address] [place]   03/13/23 Appointment Alexandria Lodge, MD  Family Medicine [address] [place]   Showing future appointments within next 365 days with a meds authorizing provider and meeting all other requirements       Labs: Not applicable this refill

## 2022-03-28 NOTE — Unmapped (Signed)
[facility] Refill Coordination Note    Tonya Brady, DOB: 09/24/1975  Phone: [phone] (home)       All above HIPAA information was verified with patient.         03/24/2022    10:00 AM   Specialty Rx Medication Refill Questionnaire   Which Medications would you like refilled and shipped? Benlysta   Please list all current allergies: Aspirin she???ll fish   Have you missed any doses in the last 30 days? No   Have you had any changes to your medication(s) since your last refill? Yes   Please list your medication(s) changes below. Imuran 100 mg   How many days remaining of each medication do you have at home? None   If receiving an injectable medication, next injection date is 03/30/2022   Have you experienced any side effects in the last 30 days? No   Please enter the full address (street address, city, state, zip code) where you would like your medication(s) to be delivered to. [address], [place], [place] [address]   Please specify on which day you would like your medication(s) to arrive. Note: if you need your medication(s) within 3 days, please call the pharmacy to schedule your order at [phone]  03/29/2022   Has your insurance changed since your last refill? No   Would you like a pharmacist to call you to discuss your medication(s)? No   Do you require a signature for your package? (Note: if we are billing Medicare Part B or your order contains a controlled substance, we will require a signature) No         Completed refill call assessment today to schedule patient's medication shipment from the [facility] [phone]).  All relevant notes have been reviewed.       Confirmed patient received a Conservation officer, historic buildings and a Surveyor, mining with first shipment. The patient will receive a drug information handout for each medication shipped and additional FDA Medication Guides as required.         REFERRAL TO PHARMACIST     Referral to the pharmacist: Not needed      [facility]     Shipping address confirmed in Epic.     Delivery Scheduled: Yes, Expected medication delivery date: 11/8.     Medication will be delivered via Same Day Courier to the prescription address in Epic WAM.    [name]   [facility] Specialty Technician

## 2022-03-30 DIAGNOSIS — M329 Systemic lupus erythematosus, unspecified: Principal | ICD-10-CM

## 2022-03-30 MED ORDER — BENLYSTA 200 MG/ML SUBCUTANEOUS AUTO-INJECTOR
SUBCUTANEOUS | 2 refills | 28 days | Status: CP
Start: 2022-03-30 — End: ?
  Filled 2022-03-29: qty 4, 28d supply, fill #2
  Filled 2022-04-27: qty 4, 28d supply, fill #0

## 2022-03-30 NOTE — Unmapped (Signed)
Benlysta refill  Last Visit Date: 02/06/2022  Next Visit Date: 06/09/2022    Lab Results   Component Value Date    ALT 14 03/21/2022    AST 19 03/21/2022    ALBUMIN 3.7 03/16/2022    CREATININE 0.93 03/21/2022     Lab Results   Component Value Date    WBC 2.5 (LL) 03/21/2022    HGB 13.6 03/21/2022    HCT 41.3 03/21/2022    PLT 174 03/21/2022     Lab Results   Component Value Date    NEUTROPCT 70.9 03/16/2022    LYMPHOPCT 29 03/21/2022    MONOPCT 12 03/21/2022    EOSPCT 1 03/21/2022    BASOPCT 0 03/21/2022

## 2022-04-27 NOTE — Unmapped (Signed)
[facility] Refill Coordination Note    Specialty Medication(s) to be Shipped:   Inflammatory Disorders: Benlysta    Other medication(s) to be shipped: No additional medications requested for fill at this time     Tonya Brady, DOB: [birth year]  Phone: [phone] (home)     All above HIPAA information was verified with patient.     Was a Nurse, learning disability used for this call? No    Completed refill call assessment today to schedule patient's medication shipment from the [facility] [phone]).  All relevant notes have been reviewed.     Specialty medication(s) and dose(s) confirmed: Regimen is correct and unchanged.   Changes to medications: Tonya Brady reports no changes at this time.  Changes to insurance: No  New side effects reported not previously addressed with a pharmacist or physician: None reported  Questions for the pharmacist: No    Confirmed patient received a Conservation officer, historic buildings and a Surveyor, mining with first shipment. The patient will receive a drug information handout for each medication shipped and additional FDA Medication Guides as required.       DISEASE/MEDICATION-SPECIFIC INFORMATION        For patients on injectable medications: Patient currently has 0 doses left.  Next injection is scheduled for 04/27/22.    SPECIALTY MEDICATION ADHERENCE     Medication Adherence    Patient reported X missed doses in the last month: 0  Specialty Medication: Benlysta 200 mg /mL -  Patient is on additional specialty medications: No  Informant: patient                  Confirmed plan for next specialty medication refill: delivery by pharmacy  Refills needed for supportive medications: not needed          Refill Coordination    Has the Patients' Contact Information Changed: No  Is the Shipping Address Different: No         Were doses missed due to medication being on hold? No    Benlysta 200 mg/ml: 0 days of medicine on hand     REFERRAL TO PHARMACIST     Referral to the pharmacist: Not needed      [facility]     Shipping address confirmed in Epic.     Delivery Scheduled: Yes, Expected medication delivery date: 04/27/22.     Medication will be delivered via Same Day Courier to the prescription address in Epic WAM.    Tonya Brady, [facility]   [facility] Specialty Pharmacist

## 2022-05-02 LAB — THIOPURINE METABOLITES: 6-TGN METABOLITE: 168 pmol/8x 10E8

## 2022-05-03 NOTE — Unmapped (Signed)
Dr. Loa Socks, please advise. Please see pts mychart message that stated the following;  Hi I just saw that wegovy has been approved by the FDA for weight loss. Is this  medication I can start?     Pt is scheduled to see you 06/20/21

## 2022-05-03 NOTE — Unmapped (Signed)
Patient called in to discuss the medication Wegovy she placed a message in Tonya Brady and got the response that she should schedule an appt hence the reason for the patients call . Per patient her and Dr.Ro already had a discussion in regards to having her put on the wegovy medication but patient hasn't been seen since September please advise

## 2022-05-05 MED ORDER — WEGOVY 0.25 MG/0.5 ML SUBCUTANEOUS PEN INJECTOR
SUBCUTANEOUS | 2 refills | 0 days | Status: CP
Start: 2022-05-05 — End: ?

## 2022-05-05 NOTE — Unmapped (Signed)
Sent pt mychart message

## 2022-05-08 DIAGNOSIS — M329 Systemic lupus erythematosus, unspecified: Principal | ICD-10-CM

## 2022-05-24 NOTE — Unmapped (Signed)
[facility] Refill Coordination Note    Specialty Medication(s) to be Shipped:   Inflammatory Disorders: Benlysta    Other medication(s) to be shipped: No additional medications requested for fill at this time     Tonya Brady, DOB: [birth year]  Phone: [phone] (home)       All above HIPAA information was verified with patient.     Was a Nurse, learning disability used for this call? No    Completed refill call assessment today to schedule patient's medication shipment from the [facility] [phone]).  All relevant notes have been reviewed.     Specialty medication(s) and dose(s) confirmed: Regimen is correct and unchanged.   Changes to medications: Tonya Brady reports no changes at this time.  Changes to insurance: No  New side effects reported not previously addressed with a pharmacist or physician: None reported  Questions for the pharmacist: No    Confirmed patient received a Conservation officer, historic buildings and a Surveyor, mining with first shipment. The patient will receive a drug information handout for each medication shipped and additional FDA Medication Guides as required.       DISEASE/MEDICATION-SPECIFIC INFORMATION        For patients on injectable medications: Patient currently has 0 doses left.  Next injection is scheduled for 05/25/22.    SPECIALTY MEDICATION ADHERENCE     Medication Adherence    Patient reported X missed doses in the last month: 0  Specialty Medication: BENLYSTA 200 mg/mL Atin (belimumab)  Patient is on additional specialty medications: No  Patient is on more than two specialty medications: No                                Were doses missed due to medication being on hold? No    Benlysta 200 mg/ml: 0 days of medicine on hand       REFERRAL TO PHARMACIST     Referral to the pharmacist: Not needed      [facility]     Shipping address confirmed in Epic.     Delivery Scheduled: Yes, Expected medication delivery date: 05/25/22.     Medication will be delivered via Same Day Courier to the prescription address in Epic WAM.    Tonya Brady   [facility] Specialty Technician

## 2022-05-25 NOTE — Unmapped (Signed)
Tonya Brady 's BENLYSTA 200 mg/mL Atin (belimumab) shipment will be delayed as a result of a high copay.     I have reached out to the patient  at [phone] and communicated the delay. We will wait for a call back from the patient to reschedule the delivery.  We have not confirmed the new delivery date.

## 2022-05-30 NOTE — Unmapped (Signed)
Tonya Brady 's BENLYSTA 200 mg/mL Atin (belimumab) shipment will be canceled  as a result of a high copay.     I have reached out to the patient  at [phone] and communicated the delivery change. We will not reschedule the medication and have removed this/these medication(s) from the work request.  We have canceled this work request.     Note: 3 failed attempts (1/4, 1/5, 1/8), copay card expired-pt will need to check email or call [phone] for new card info. Pt read mychart on 1/4.

## 2022-06-07 NOTE — Unmapped (Signed)
[facility] Refill Coordination Note    Specialty Medication(s) to be Shipped:   Inflammatory Disorders: Benlysta    Other medication(s) to be shipped: No additional medications requested for fill at this time     Tonya Brady, DOB: Feb 18, 1976  Phone: [phone] (home)       All above HIPAA information was verified with patient.     Was a Nurse, learning disability used for this call? No    Completed refill call assessment today to schedule patient's medication shipment from the [facility] [phone]).  All relevant notes have been reviewed.     Specialty medication(s) and dose(s) confirmed: Regimen is correct and unchanged.   Changes to medications: Taralyn reports no changes at this time.  Changes to insurance: No  New side effects reported not previously addressed with a pharmacist or physician: None reported  Questions for the pharmacist: No    Confirmed patient received a Conservation officer, historic buildings and a Surveyor, mining with first shipment. The patient will receive a drug information handout for each medication shipped and additional FDA Medication Guides as required.       DISEASE/MEDICATION-SPECIFIC INFORMATION        For patients on injectable medications: Patient currently has 0 doses left.  Next injection is scheduled for 06/08/22 .    SPECIALTY MEDICATION ADHERENCE     Medication Adherence    Patient reported X missed doses in the last month: 0  Specialty Medication: Benlysta  200mg /ml  Patient is on additional specialty medications: No  Patient is on more than two specialty medications: No  Any gaps in refill history greater than 2 weeks in the last 3 months: no  Demonstrates understanding of importance of adherence: yes                                Were doses missed due to medication being on hold? No    Benlysta 200 mg/ml: 0 days of medicine on hand       REFERRAL TO PHARMACIST     Referral to the pharmacist: Not needed      [facility]     Shipping address confirmed in Epic. Delivery Scheduled: Yes, Expected medication delivery date: 06/09/22 .     Medication will be delivered via Same Day Courier to the prescription address in Epic WAM.    Ricci Barker   [facility] Specialty Technician

## 2022-06-09 ENCOUNTER — Ambulatory Visit
Admit: 2022-06-09 | Discharge: 2022-06-10 | Payer: PRIVATE HEALTH INSURANCE | Attending: Student in an Organized Health Care Education/Training Program | Primary: Student in an Organized Health Care Education/Training Program

## 2022-06-09 DIAGNOSIS — M329 Systemic lupus erythematosus, unspecified: Principal | ICD-10-CM

## 2022-06-09 LAB — HEPATIC FUNCTION PANEL
ALBUMIN: 4.1 g/dL (ref 3.4–5.0)
ALKALINE PHOSPHATASE: 40 U/L — ABNORMAL LOW (ref 46–116)
ALT (SGPT): 21 U/L (ref 10–49)
AST (SGOT): 11 U/L (ref <=34)
BILIRUBIN DIRECT: 0.3 mg/dL (ref 0.00–0.30)
BILIRUBIN TOTAL: 0.8 mg/dL (ref 0.3–1.2)
PROTEIN TOTAL: 7.5 g/dL (ref 5.7–8.2)

## 2022-06-09 LAB — CREATININE
CREATININE: 0.7 mg/dL
EGFR CKD-EPI (2021) FEMALE: >90 mL/min/{1.73_m2} (ref >=60)

## 2022-06-09 LAB — CBC W/ AUTO DIFF
BASOPHILS ABSOLUTE COUNT: 0 10*9/L (ref 0.0–0.1)
BASOPHILS RELATIVE PERCENT: 0.1 %
EOSINOPHILS ABSOLUTE COUNT: 0 10*9/L (ref 0.0–0.5)
EOSINOPHILS RELATIVE PERCENT: 0.2 %
HEMATOCRIT: 44.5 % — ABNORMAL HIGH (ref 34.0–44.0)
HEMOGLOBIN: 15 g/dL — ABNORMAL HIGH (ref 11.3–14.9)
LYMPHOCYTES ABSOLUTE COUNT: 0.6 10*9/L — ABNORMAL LOW (ref 1.1–3.6)
LYMPHOCYTES RELATIVE PERCENT: 9.7 %
MEAN CORPUSCULAR HEMOGLOBIN CONC: 33.7 g/dL (ref 32.0–36.0)
MEAN CORPUSCULAR HEMOGLOBIN: 31.6 pg (ref 25.9–32.4)
MEAN CORPUSCULAR VOLUME: 94 fL (ref 77.6–95.7)
MEAN PLATELET VOLUME: 9.5 fL (ref 6.8–10.7)
MONOCYTES ABSOLUTE COUNT: 0.8 10*9/L (ref 0.3–0.8)
MONOCYTES RELATIVE PERCENT: 11.4 %
NEUTROPHILS ABSOLUTE COUNT: 5.2 10*9/L (ref 1.8–7.8)
NEUTROPHILS RELATIVE PERCENT: 78.6 %
PLATELET COUNT: 225 10*9/L (ref 150–450)
RED BLOOD CELL COUNT: 4.74 10*12/L (ref 3.95–5.13)
RED CELL DISTRIBUTION WIDTH: 13.7 % (ref 12.2–15.2)
WBC ADJUSTED: 6.6 10*9/L (ref 3.6–11.2)

## 2022-06-09 LAB — C3 COMPLEMENT: C3 COMPLEMENT: 76 mg/dL — ABNORMAL LOW (ref 90–170)

## 2022-06-09 LAB — C4 COMPLEMENT: C4 COMPLEMENT: 17 mg/dL (ref 12.0–36.0)

## 2022-06-09 MED ORDER — TACROLIMUS 0.1 % TOPICAL OINTMENT
Freq: Two times a day (BID) | TOPICAL | 0 refills | 100 days | Status: CP
Start: 2022-06-09 — End: 2023-06-09

## 2022-06-09 NOTE — Unmapped (Signed)
Rheumatology Clinic Follow-up Note     Assessment/Plan:    Tonya Brady is a 47 y.o.  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    In brief, diagnosed 2005 with SLE and secondary Sjogrens which has been characterized by +ANA, +SSA, +SSB, +RF, leukopenia, rashes, arthritis, parotid gland swelling. Currently on HCQ, benlysta, and AZA. See previous medication trials in HPI below.    SLE, Sjogrens: Today, with ongoing cutaneous annular lesions on extremities and on R temporal area without evidence of an active inflammatory arthritis. Otherwise stable. As she has not had a discernible improvement on AZA, and in light of her liver disease, we will discontinue this. Her cutaneous lesions have not readily responded to SLE medication adjustments or topical clobetasol, we also discussed getting her to derm for an evaluation. In meantime, will try topical tacrolimus.  - CBC w/ diff, Cr, hepatic panel, C3, C4, dsDNA  - Cont HCQ 200mg  BID (5mg /kg = 363.5), will discuss dose adjustments pending her course but as I am meeting her for the first time we did not change today  - Cont Belimumab SQ qweekly  - STOP AZA  - Derm Referral  - Start Protopic    Amplified Pain - Mechanical Pain: Worsened in setting of recent decrease in exercise, stress, and poor sleep. We discussed refocusing on general wellness, she is amenable. She is on Wellbutrin 300mg  daily and has had benefit from this.     Healthcare maintenance  - Vaccinations: UTD covid and bivalent vaccination  - HCQ: eye exam Jan 2022 reportedly normal. Overdue and encouraged to shedule this month    Return in about 3 months (around 09/08/2022).    Fleeta Emmer, MD  Assistant Professor of Medicine  Department of Medicine/Division of Rheumatology  [facility]    Primary Care Provider: Alexandria Lodge, MD    HPI:  Tonya Brady is a 47 y.o. 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    Today  Patient reports she has continued to have the cutaneous lesions despite multiple previous prednisone courses. Most recently did a pred dose pack (60mg  starting dose with taper over a week). Improved but has never gone away over past 3 months. Joints and muscles continue to hurt. Tolerated AZA well but neither skin nor joints are discernible changed despite taking this since 01/2022. Otherwise well without CP, SOB, ocular disease.    Does note increased stress lately with grandmother passing away and decreased exercise/poor sleep. Has seen chiropractor and this has helped some, told she has a R rotator cuff tendinopathy.      Disease History:  - 2005 presented with bilateral partoid swelling, sicca, pancytopenia. Hospitalized with small pericardial effusion.  Eval at that time with +ANA 1:320 speckled, +SSA, +SSB, +RF 120, -dsDNA. Initiated HCQ.  - 2007 - 2010 continued PRN prednisone for parotid swelling. Concurrent intermittent antibiotics for concern for concurrent infections. Evaluated in ENT.  - 2009 trial of MTX for parotid swelling. Hospitalized for abdominal pain & elevated LFTs. MTX discontinued. After discontinuation of MTX developed worsening joint pain & swelling of hands.   - 2011 developed ACLE/malar rash after discontinuing HCQ. At that time labs were concerning for +ANA 1:640 speckled, -dsDNA. Restarted HCQ.  - 2012 concern for worsening arthritis pain.  Initiated azathioprine while continued HCQ.  - 2013 patient self-discontinued medications for nausea.  - 2014 re-presented with malar & anterior throat rash for which HCQ was restarted.  - 2015-2017 lost to follow-up. Restarted HCQ. Concern for elevated LFTs for which she was evaluated by GI & underwent liver biopsy which confirmed NAFLD/fibrosis.  - 2018 - 2019 continued on HCQ. Concern for active synovitis on exam promtping steroid taper.   - 2020 belimumab started for persistent arthralgias, new rash. Underwent bilateral sialendoscopies which improved her salivary movement significantly.    - 2021-2022 continued belimumab, HCQ.  - 2023 added back to AZA to HCQ and belimumab due to rash, joint/muscle pains. Patient also felt Belimumab was impairing ability to clear infections.     Treatment:  - Belimumab [2020 - current]  - HCQ [2005 - current; intermittent lapses in medication use - current]  - MTX [2009] - discontinued 2/2 severe GI side effects  - Azathioprine [2012 - 2013]  - Multiple prednisone tapers     Labs:  - As above [not all available dating back to 2005]  - 2019: +SSA/SSB, +RF 30, -CCP     Imaging:  - 2019: Xray hands: Nondisplaced fracture of the distal tuft of the left first distal phalanx. No evidence of erosive or inflammatory arthropathy.  - 2019: Xray L foot: No acute fracture or malalignment. Normal soft tissues.     Review of Systems:  Positive findings noted above, otherwise a 14 point review of systems was reviewed and negative    Past Medical, Surgical, Family and Social History reviewed and updated per EMR     Allergies:  Aspirin, Fish containing products, Iodine, and Shellfish containing products    Medications:     Current Outpatient Medications:     aluminum-magnesium hydroxide-simethicone (MAALOX PLUS) 200-200-20 mg/5 mL Susp, , Disp: , Rfl:     azaTHIOprine (AZASAN) 100 mg tablet, Take 1 tablet (100 mg total) by mouth daily., Disp: 90 tablet, Rfl: 1    belimumab (BENLYSTA) 200 mg/mL AtIn, Inject the contents of 1 auto-injector (200 mg total) under the skin every seven (7) days., Disp: 4 mL, Rfl: 2    buPROPion (WELLBUTRIN XL) 300 MG 24 hr tablet, Take 1 tablet (300 mg total) by mouth every morning., Disp: 60 tablet, Rfl: 2    clobetasoL (TEMOVATE) 0.05 % ointment, Use clobetasol ointment twice daily from the neck down, stop after 2 weeks, Disp: 60 g, Rfl: 1 famotidine (PEPCID) 40 MG tablet, TAKE 1 TABLET(40 MG) BY MOUTH EVERY NIGHT AS NEEDED FOR HEARTBURN, Disp: 90 tablet, Rfl: 3    fluticasone propionate (FLONASE) 50 mcg/actuation nasal spray, 1 spray into each nostril daily., Disp: 16 g, Rfl: 11    hydrOXYchloroQUINE (PLAQUENIL) 200 mg tablet, Take 1 tablet (200 mg total) by mouth Two (2) times a day., Disp: 180 tablet, Rfl: 3    ibuprofen (MOTRIN) 800 MG tablet, ibuprofen 800 mg tablet, Disp: , Rfl:     naltrexone (DEPADE) 50 mg tablet, Take 0.5 tablets (25 mg total) by mouth two (2) times a day., Disp: 60 tablet, Rfl: 2    WEGOVY 0.25 MG/0.5 ML SUBCUTANEOUS PEN INJECTOR, Inject 0.25 mg under the skin every seven (7) days., Disp: 4 each, Rfl: 2    tacrolimus (PROTOPIC) 0.1 % ointment, Apply 1 Application topically two (2) times a day., Disp: 100 g, Rfl: 0      Objective   Vitals:    06/09/22 1129   BP: 112/73   BP  Site: L Arm   BP Position: Sitting   BP Cuff Size: Medium   Pulse: 71   Temp: 36.3 ??C (97.3 ??F)   TempSrc: Temporal   Weight: 72.7 kg (160 lb 3.2 oz)       Physical Exam  General: well appearing, no acute distress  Eyes: EOMI, normal conjunctivae   ENT: MMM.  Oropharynx without any erythema or exudate.  No oral or nasal ulcers.  Neck: supple. No cervical lymphadenopathy  Cardiovascular: Regular rate and rhythm. Nl S1 and S2.  Pulmonary: Nl WOB on RA, CTAB.  Skin: Annular lesions on extremities and R temporal area. Scarred areas of previous rash. No other rash, lesions, breakdown. No purpura or petechiae. No digital ulcers.   Extremities: Warm and well perfused, no cyanosis, clubbing or edema  Musculoskeletal: No synovitis or tenderness to palpation in the hands, wrists, elbows, shoulders, knees, ankles, or feet. Full ROM throughout.   Neurologic: Cranial nerves grossly intact, strength 5/5 throughout.  Normal sensation  Psychiatric: Normal mood and affect.    I personally spent 34 minutes face-to-face and non-face-to-face in the care of this patient, which includes all pre, intra, and post visit time on the date of service.  All documented time was specific to the E/M visit and does not include any procedures that may have been performed.

## 2022-06-12 MED FILL — BENLYSTA 200 MG/ML SUBCUTANEOUS AUTO-INJECTOR: SUBCUTANEOUS | 28 days supply | Qty: 4 | Fill #1

## 2022-06-12 NOTE — Unmapped (Signed)
Tonya Brady 's Benlysta shipment will be sent out  as a result of rescheduled.     I have reached out to the patient  at [phone] and communicated the delivery change. We will reschedule the medication for the delivery date that the patient agreed upon.  We have confirmed the delivery date as 06/12/22, via same day courier.

## 2022-06-14 LAB — ANTI-DNA ANTIBODY, DOUBLE-STRANDED: DSDNA ANTIBODY: NEGATIVE

## 2022-06-20 ENCOUNTER — Ambulatory Visit
Admit: 2022-06-20 | Discharge: 2022-06-21 | Payer: PRIVATE HEALTH INSURANCE | Attending: Family Medicine | Primary: Family Medicine

## 2022-06-20 DIAGNOSIS — K7581 Nonalcoholic steatohepatitis (NASH): Principal | ICD-10-CM

## 2022-06-20 DIAGNOSIS — E669 Obesity, unspecified: Principal | ICD-10-CM

## 2022-06-20 DIAGNOSIS — K7460 Unspecified cirrhosis of liver: Principal | ICD-10-CM

## 2022-06-20 MED ORDER — NALTREXONE 50 MG TABLET
ORAL_TABLET | Freq: Two times a day (BID) | ORAL | 2 refills | 60 days | Status: CP
Start: 2022-06-20 — End: ?

## 2022-06-20 MED ORDER — BUPROPION HCL XL 300 MG 24 HR TABLET, EXTENDED RELEASE
ORAL_TABLET | Freq: Every morning | ORAL | 2 refills | 60 days | Status: CP
Start: 2022-06-20 — End: 2023-06-20

## 2022-06-20 NOTE — Unmapped (Addendum)
Ms. SHATASIA CEREZO is a 47 y.o. 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 Her medical history includes SLE, cirrhosis, GERD, suspected OSA, depression, and emotional / stress eating. Pt wishes weight loss to prevent further liver damage.    Weight Summary:  Starting weight/BMI/WC: 209 lb, BMI 39.6 (06/13/19, self report), WC 39 (11/04/19, 1st measure)  Target weight/goal: BMI 30 or 159 lb.  Today's weight/BMI:  , BMI   (06/20/2022)   % Body weight loss: 20.6%  Today's   (06/20/2022) <= 38.75 (01/07/20)  Today's visit number: 13th  Duration in program: 36 mo    Referred by: [name], MD in [facility].    GOALS: 1) Cont to work towards black coffee  2) Cont to dec soda    I have reviewed the patient's medical history, lifestyle history and labs/tests.   My recommendations include the following:    Lifestyle Pattern Summary:  - See GOALS above.     Weight gain causing medication: None.   Hx of long term steroid use (prednisone 80 mg daily for lupus).  Hx of Ambien for insomnia years ago.    Medication:   Wellbutrin 300 mg  Naltrexone 25 mg BID    Tried  - Orlistat: tried and failed.  - Ozempic -- good WL, but insurance denial   - Mounjaro -- 16 lb WL, but pharmacy denial  Contraindicated  - Topamax: contraindicated dhx of kidney stone.  - Phentermine: contraindicated by hx of palpitations.    Obesity Surgery: Pt does not meet criteria based on BMI.    Medical conditions:  Glaucoma: no  Seizures: no  Medullary thyroid cancer (personal or family hx): Father and PGM both had thyroid goiter. Multiple Endocrine Neoplasia: no  Palpitations/Tachycardia: YES  Chest Pain: no past history of MI.   Headaches/Migraines: YES  Nephrolithiasis: YES  H/o pancreatitis: YES, gallstone pancreatitis and she had cholecystectomy.  GERD: YES    Prior Surgeries:  Choleycystectomy: YES  Hysterectomy: YES    Birth Control Methods: N/A; s/p hysterectomy.

## 2022-06-20 NOTE — Unmapped (Signed)
[facility] Follow Up    Assessment/Plan:     Chief Complaint   Patient presents with    Follow-up     Wt mngmt       Problem List Items Addressed This Visit          Unprioritized    Liver cirrhosis secondary to NASH (nonalcoholic steatohepatitis) (CMS-HCC) - Primary     Pt followed by hepatology closely. Reviewed relevant medications and labs. Pt is following our [facility]. See Obesity tab for medication changes.          Class 1 obesity     [name] is a 47 y.o. WF with Class 2 obesity due to long term steroid use (dx of SLE since age 3). Her medical history includes SLE, cirrhosis, GERD, suspected OSA, depression, and emotional / stress eating. Pt wishes weight loss to prevent further liver damage.    Weight Summary:  Starting weight/BMI/WC: 209 lb, BMI 39.6 (06/13/19, self report), WC 39 (11/04/19, 1st measure)  Target weight/goal: BMI 30 or 159 lb.  Today's weight/BMI:  , BMI   (06/20/2022)   % Body weight loss: 20.6%  Today's   (06/20/2022) <= 38.75 (01/07/20)  Today's visit number: 13th  Duration in program: 36 mo    Referred by: [name], MD in [facility].    GOALS: 1) Cont to work towards black coffee  2) Cont to dec soda    I have reviewed the patient's medical history, lifestyle history and labs/tests.   My recommendations include the following:    Lifestyle Pattern Summary:  - See GOALS above.     Weight gain causing medication: None.   Hx of long term steroid use (prednisone 80 mg daily for lupus).  Hx of Ambien for insomnia years ago.    Medication:   Wellbutrin 300 mg  Naltrexone 25 mg BID    Tried  - Orlistat: tried and failed.  - Ozempic -- good WL, but insurance denial   - Mounjaro -- 16 lb WL, but pharmacy denial  Contraindicated  - Topamax: contraindicated dhx of kidney stone.  - Phentermine: contraindicated by hx of palpitations.    Obesity Surgery: Pt does not meet criteria based on BMI.    Medical conditions:  Glaucoma: no  Seizures: no  Medullary thyroid cancer (personal or family hx): Father and PGM both had thyroid goiter. Multiple Endocrine Neoplasia: no  Palpitations/Tachycardia: YES  Chest Pain: no past history of MI.   Headaches/Migraines: YES  Nephrolithiasis: YES  H/o pancreatitis: YES, gallstone pancreatitis and she had cholecystectomy.  GERD: YES    Prior Surgeries:  Choleycystectomy: YES  Hysterectomy: YES    Birth Control Methods: N/A; s/p hysterectomy.                   Return in about 3 months (around 09/19/2022) for Weight clinic F/U one slot..    I have reviewed and addressed the patient???s adherence and response to prescribed medications. I have identified patient barriers to following the proposed medication and treatment plan, and have noted opportunities to optimize healthy behaviors. I have answered the patient???s questions to satisfaction and the patient voices understanding.    Time: Greater than 50% of this encounter was spent in direct consultation with the patient in evaluation and discussing all of the above. Duration of encounter: 30 minutes.     HPI:     Tonya Brady is a 47 y.o. year old female who  has a  has a past medical history of Acute carpal tunnel syndrome of left wrist (11/08/2017), Anemia, Anxiety, Arthritis, Bulging lumbar disc (2005), Cirrhosis of liver (CMS-HCC), COVID-19 (06/18/2019), Depression, Diabetes mellitus (CMS-HCC), Difficult intravenous access, Fibromyalgia (03/09/2013), Heart murmur, NASH (nonalcoholic steatohepatitis), Obesity, Pyelonephritis (03/07/2021), Restless leg syndrome (06/30/2013), Secondary Sjogren's syndrome (CMS-HCC) (03/09/2013), Systemic lupus erythematosus (CMS-HCC) (01/19/2011), and Valvular regurgitation. who presents today for [facility] follow up.    Weight Management History    Wt Readings from Last 6 Encounters:   06/20/22 73.1 kg (161 lb 3.2 oz)   06/09/22 72.7 kg (160 lb 3.2 oz)   03/20/22 73.7 kg (162 lb 8 oz)   03/08/22 74.8 kg (165 lb) 02/14/22 75.3 kg (166 lb 0.6 oz)   02/06/22 75 kg (165 lb 6.4 oz)         07/13/2020     1:02 PM 12/07/2020    10:14 AM 02/17/2021     1:50 PM 07/19/2021     9:00 AM 10/20/2021     9:05 AM 02/14/2022     9:52 AM 06/20/2022     9:46 AM   Waist Circumference   Waist Circumference 35.75 inches 36 inches 37 inches 36 inches 32.5 inches 34 inches 33.8 inches          Update: ate cake but then walked for one hour. Son is pt's buddy because he is a wrestler so has to avoid certain foods. No longer having to cook different meals for children and her.   Medication: Wellbutrin and naltrexone  Eating patterns: eating more in the morning, sometimes skips lunch. Not eating out as much, once per week, choosing places w more vegetables. Eating more salads and baking chicken. Cake tastes too sweet now. Eating more greek yogurt and fruit.   Beverages: almost drinking black coffee, just a splash of creamer now. 1-2 sodas/month.   Physical Activity: walking and lifting weights for 60 min 2-4 times per week.   Barriers: was taking care of her grandmother who was declining hospice, she has since passed away. Was having more cravings.     Lab Results   Component Value Date    LDL Calculated 86 03/16/2022    LDL Cholesterol, Calculated 104 06/23/2011    HDL 39 (L) 03/16/2022    HDL 35 (L) 06/23/2011    Hemoglobin A1C 5.4 03/03/2020    Hemoglobin A1C 5.9 02/10/2016    Hemoglobin A1C 5.5 06/23/2011    Glucose 88 03/16/2022    TSH 2.500 11/10/2015    TSH 1.92 06/23/2011    Vitamin D Total (25OH) 28.8 09/14/2021    AST 11 06/09/2022    AST 19 03/21/2022    ALT 21 06/09/2022    ALT 14 03/21/2022    Alkaline Phosphatase 40 (L) 06/09/2022    Alkaline Phosphatase 45 03/21/2022     Past Medical/Surgical History:     Past Medical History:   Diagnosis Date    Acute carpal tunnel syndrome of left wrist 11/08/2017    Anemia     Anxiety     Arthritis     Bulging lumbar disc 2005    Cirrhosis of liver (CMS-HCC)     COVID-19 06/18/2019    Depression Diabetes mellitus (CMS-HCC)     Difficult intravenous access     Fibromyalgia 03/09/2013    Heart murmur     NASH (nonalcoholic steatohepatitis)     Obesity     Pyelonephritis 03/07/2021    Restless leg syndrome  06/30/2013    Secondary Sjogren's syndrome (CMS-HCC) 03/09/2013    Secondary to SLE  Diagnosed: 12/2003 (by sx's, parotid gland enlargement, and serologies) Pathology: biopsy not done Labs: + ANA, anti-SSA, anti-SSB, RF     Systemic lupus erythematosus (CMS-HCC) 01/19/2011    Valvular regurgitation      Past Surgical History:   Procedure Laterality Date    CARPAL TUNNEL RELEASE      CESAREAN SECTION      CHOLECYSTECTOMY      HERNIA REPAIR      HYSTERECTOMY      PR REVISE MEDIAN N/CARPAL TUNNEL SURG Left 12/30/2018    Procedure: R16 NEUROPLASTY AND/OR TRANSPOSITION; MEDIAN NERVE AT CARPAL TUNNEL;  Surgeon: Theodora Blow Jacqlyn Krauss, MD;  Location: ASC [facility];  Service: Orthopedics    PR SALIVARY SURG UNLISTED PROC Right 12/06/2018    Procedure: R22 UNLISTED PROC SALIVARY GLANDS/DUCTS;  Surgeon: Michelle Piper, MD;  Location: ASC OR [facility];  Service: ENT    PR SALIVARY SURG UNLISTED PROC Left 02/18/2019    Procedure: R25 UNLISTED PROC SALIVARY GLANDS/DUCTS;  Surgeon: Michelle Piper, MD;  Location: [facility] OR [facility];  Service: ENT    PR UPPER GI ENDOSCOPY,DIAGNOSIS N/A 05/10/2016    Procedure: UGI ENDO, INCLUDE ESOPHAGUS, STOMACH, & DUODENUM &/OR JEJUNUM; DX W/WO COLLECTION SPECIMN, BY BRUSH OR WASH;  Surgeon: Carmon Ginsberg, MD;  Location: [facility];  Service: Gastroenterology    PR UPPER GI ENDOSCOPY,DIAGNOSIS N/A 05/02/2019    Procedure: UGI ENDO, INCLUDE ESOPHAGUS, STOMACH, & DUODENUM &/OR JEJUNUM; DX W/WO COLLECTION SPECIMN, BY BRUSH OR WASH;  Surgeon: Chriss Driver, MD;  Location: [facility];  Service: Gastroenterology    PR UPPER GI ENDOSCOPY,DIAGNOSIS N/A 04/26/2021    Procedure: UGI ENDO, INCLUDE ESOPHAGUS, STOMACH, & DUODENUM &/OR JEJUNUM; DX W/WO COLLECTION SPECIMN, BY BRUSH OR WASH;  Surgeon: Pia Mau, MD;  Location: [facility];  Service: Gastroenterology    TOTAL ABDOMINAL HYSTERECTOMY      TUBAL LIGATION         Social History:     Social History     Socioeconomic History    Marital status: Married     Spouse name: None    Number of children: None    Years of education: None    Highest education level: None   Tobacco Use    Smoking status: Never    Smokeless tobacco: Never   Vaping Use    Vaping Use: Never used   Substance and Sexual Activity    Alcohol use: No     Alcohol/week: 0.0 standard drinks of alcohol    Drug use: Never    Sexual activity: Not Currently     Partners: Male   Other Topics Concern    Do you use sunscreen? Yes    Tanning bed use? No    Are you easily burned? No    Excessive sun exposure? No    Blistering sunburns? No     Social Determinants of Health     Financial Resource Strain: Low Risk  (10/15/2019)    Overall Financial Resource Strain (CARDIA)     Difficulty of Paying Living Expenses: Not hard at all   Food Insecurity: No Food Insecurity (10/15/2019)    Hunger Vital Sign     Worried About Running Out of Food in the Last Year: Never true     Ran Out of Food in the Last Year: Never true  Transportation Needs: No Transportation Needs (11/14/2019)    PRAPARE - Therapist, art (Medical): No     Lack of Transportation (Non-Medical): No   Physical Activity: Insufficiently Active (11/14/2019)    Exercise Vital Sign     Days of Exercise per Week: 3 days     Minutes of Exercise per Session: 30 min   Stress: Stress Concern Present (11/14/2019)    [employer] of Occupational Health - Occupational Stress Questionnaire     Feeling of Stress : To some extent   Social Connections: Unknown (11/14/2019)    Social Connection and Isolation Panel [NHANES]     Frequency of Communication with Friends and Family: Three times a week     Frequency of Social Gatherings with Friends and Family: Three times a week     Attends Religious Services: Never     Active Member of Clubs or Organizations: No     Attends Banker Meetings: Never       Family History:     Family History   Problem Relation Age of Onset    Cancer Mother     Hypertension Mother     Alcohol abuse Mother     Arthritis Mother     Heart disease Father     Heart attack Father     Alcohol abuse Father     Diabetes Maternal Grandmother     Hypertension Maternal Grandmother     Thyroid disease Maternal Grandmother     Breast cancer Maternal Grandmother     Hearing loss Maternal Grandmother     Breast cancer Paternal Grandmother     Diabetes Paternal Grandmother     Thyroid disease Paternal Grandmother     Arthritis Paternal Grandmother     Cancer Paternal Grandmother     Depression Son     Mental illness Son     Breast cancer Maternal Aunt     Aneurysm Maternal Aunt     Diabetes Maternal Aunt     Diabetes Maternal Uncle     Heart attack Maternal Uncle     Alcohol abuse Maternal Grandfather     Alcohol abuse Paternal Grandfather     Cancer Paternal Grandfather     Liver disease Paternal Grandfather     Depression Son     Mental illness Son     Learning disabilities Son     Diabetes Maternal Uncle     Drug abuse Maternal Aunt     Drug abuse Maternal Aunt     Drug abuse Maternal Uncle     Drug abuse Maternal Uncle     Mental illness Paternal Uncle     Lupus Maternal Aunt     Seizures Maternal Aunt     Seizures Son     Seizures Daughter     Melanoma Neg Hx     Basal cell carcinoma Neg Hx     Squamous cell carcinoma Neg Hx     Stroke Neg Hx        Allergies:     Aspirin, Fish containing products, Iodine, and Shellfish containing products    Current Medications:     Current Outpatient Medications   Medication Sig Dispense Refill    aluminum-magnesium hydroxide-simethicone (MAALOX PLUS) 200-200-20 mg/5 mL Susp       azaTHIOprine (AZASAN) 100 mg tablet Take 1 tablet (100 mg total) by mouth daily. 90 tablet 1    belimumab (BENLYSTA) 200 mg/mL AtIn Inject the  contents of 1 auto-injector (200 mg total) under the skin every seven (7) days. 4 mL 2    clobetasoL (TEMOVATE) 0.05 % ointment Use clobetasol ointment twice daily from the neck down, stop after 2 weeks 60 g 1    famotidine (PEPCID) 40 MG tablet TAKE 1 TABLET(40 MG) BY MOUTH EVERY NIGHT AS NEEDED FOR HEARTBURN 90 tablet 3    fluticasone propionate (FLONASE) 50 mcg/actuation nasal spray 1 spray into each nostril daily. 16 g 11    hydrOXYchloroQUINE (PLAQUENIL) 200 mg tablet Take 1 tablet (200 mg total) by mouth Two (2) times a day. 180 tablet 3    ibuprofen (MOTRIN) 800 MG tablet ibuprofen 800 mg tablet      tacrolimus (PROTOPIC) 0.1 % ointment Apply 1 Application topically two (2) times a day. 100 g 0    buPROPion (WELLBUTRIN XL) 300 MG 24 hr tablet Take 1 tablet (300 mg total) by mouth every morning. 60 tablet 2    naltrexone (DEPADE) 50 mg tablet Take 0.5 tablets (25 mg total) by mouth two (2) times a day. 60 tablet 2     No current facility-administered medications for this visit.       I have reviewed and (if needed) updated the patient's problem list, medications, allergies, past medical and surgical history, social and family history.    ROS:     Unless otherwise stated in the HPI:  CONSTITUTIONAL: no fever chills.   HEENT: Eyes: No diplopia or blurred vision. ENT: No earache, sore throat or runny nose.   CARDIOVASCULAR: No pressure, squeezing, strangling, tightness, heaviness or aching about the chest, neck, axilla or epigastrium.   RESPIRATORY: No cough, shortness of breath, PND or orthopnea.   GASTROINTESTINAL: No nausea, vomiting or diarrhea.   GENITOURINARY: No dysuria, frequency or urgency.   MUSCULOSKELETAL: no new pains, no joint swelling or redness.   SKIN: No change in skin, hair or nails.   NEUROLOGIC: No paresthesias, fasciculations, seizures or weakness.   PSYCHIATRIC: No disorder of thought or mood.   ENDOCRINE: No heat or cold intolerance, polyuria or polydipsia.   HEMATOLOGICAL: No easy bruising or bleeding.    Vital Signs:     Body mass index is 31.48 kg/m??.  Waist Circumference: 33.8 inches     Wt Readings from Last 3 Encounters:   06/20/22 73.1 kg (161 lb 3.2 oz)   06/09/22 72.7 kg (160 lb 3.2 oz)   03/20/22 73.7 kg (162 lb 8 oz)     Temp Readings from Last 3 Encounters:   06/20/22 36.3 ??C (97.4 ??F)   06/09/22 36.3 ??C (97.3 ??F) (Temporal)   03/08/22 35.9 ??C (96.6 ??F) (Temporal)     BP Readings from Last 3 Encounters:   06/20/22 115/75   06/09/22 112/73   03/20/22 108/69     Pulse Readings from Last 3 Encounters:   06/20/22 66   06/09/22 71   03/20/22 61         07/13/2020     1:02 PM 12/07/2020    10:14 AM 02/17/2021     1:50 PM 07/19/2021     9:00 AM 10/20/2021     9:05 AM 02/14/2022     9:52 AM 06/20/2022     9:46 AM   Waist Circumference   Waist Circumference 35.75 inches 36 inches 37 inches 36 inches 32.5 inches 34 inches 33.8 inches     Physical Exam:     General: well appearing, in NAD, Body mass index is 31.48 kg/m??.  Ambulatory without help.  Body fat distribution: central & gluteofemoral adiposity. No supraclavicular adiposity. No dorsal adiposity. Waist Circumference: 33.8 inches     Head: normocephalic atraumatic.  Eyes: PERRLA, EOMI, Sclera WNL.   Oral pharynx: moist, no exudate, no erythema, not enlarged. Good dental hygiene. Moderate OP crowding.  Neck: supple, no LAD, no thyromegaly, no bruit.  CV: RRR no rubs or murmurs. Peripheral edema: none.  Lungs: clear bilaterally to auscultation. No wheezing.  Abdomen: soft, NT/ND. No intertrigo. Moderate pannus. No hepatomegaly.   Extremities: no clubbing, cyanosis, No edema.  Skin: no concerning lesions, rashes observed. No lipomas, no acanthosis nigricans.  Neuro: Alert and oriented X 3.    Labs:     Office Visit on 06/09/2022   Component Date Value Ref Range Status    Creatinine 06/09/2022 0.70  0.55 - 1.02 mg/dL Final    eGFR CKD-EPI (2021) Female 06/09/2022 >90  >=60 mL/min/1.1m2 Final    eGFR calculated with CKD-EPI 2021 equation in accordance with [employer] and [employer] of Nephrology Task Force recommendations.    Albumin 06/09/2022 4.1  3.4 - 5.0 g/dL Final    Total Protein 06/09/2022 7.5  5.7 - 8.2 g/dL Final    Total Bilirubin 06/09/2022 0.8  0.3 - 1.2 mg/dL Final    Bilirubin, Direct 06/09/2022 0.30  0.00 - 0.30 mg/dL Final    AST 09/81/1914 11  <=34 U/L Final    ALT 06/09/2022 21  10 - 49 U/L Final    Alkaline Phosphatase 06/09/2022 40 (L)  46 - 116 U/L Final    C3 Complement 06/09/2022 76 (L)  90 - 170 mg/dL Final    C4 Complement 06/09/2022 17.0  12.0 - 36.0 mg/dL Final    dsDNA Ab 78/29/5621 Negative  Negative Final    WBC 06/09/2022 6.6  3.6 - 11.2 10*9/L Final    RBC 06/09/2022 4.74  3.95 - 5.13 10*12/L Final    HGB 06/09/2022 15.0 (H)  11.3 - 14.9 g/dL Final    HCT 30/86/5784 44.5 (H)  34.0 - 44.0 % Final    MCV 06/09/2022 94.0  77.6 - 95.7 fL Final    MCH 06/09/2022 31.6  25.9 - 32.4 pg Final    MCHC 06/09/2022 33.7  32.0 - 36.0 g/dL Final    RDW 69/62/9528 13.7  12.2 - 15.2 % Final    MPV 06/09/2022 9.5  6.8 - 10.7 fL Final    Platelet 06/09/2022 225  150 - 450 10*9/L Final    Neutrophils % 06/09/2022 78.6  % Final    Lymphocytes % 06/09/2022 9.7  % Final    Monocytes % 06/09/2022 11.4  % Final    Eosinophils % 06/09/2022 0.2  % Final    Basophils % 06/09/2022 0.1  % Final    Absolute Neutrophils 06/09/2022 5.2  1.8 - 7.8 10*9/L Final    Absolute Lymphocytes 06/09/2022 0.6 (L)  1.1 - 3.6 10*9/L Final    Absolute Monocytes 06/09/2022 0.8  0.3 - 0.8 10*9/L Final    Absolute Eosinophils 06/09/2022 0.0  0.0 - 0.5 10*9/L Final    Absolute Basophils 06/09/2022 0.0  0.0 - 0.1 10*9/L Final       Follow-up:   Return in about 3 months (around 09/19/2022) for Weight clinic F/U one slot..    I attest that I, Constance Goltz, personally documented this note while acting as scribe for Marshell Garfinkel, MD.      Constance Goltz, Scribe.  06/20/2022     The documentation recorded by the scribe accurately reflects the service I personally performed and the decisions made by me.    Marshell Garfinkel, MD

## 2022-06-22 NOTE — Unmapped (Signed)
Pt followed by hepatology closely. Reviewed relevant medications and labs. Pt is following our Weight Management Clinic. See Obesity tab for medication changes.

## 2022-06-24 NOTE — Unmapped (Signed)
This encounter was created in error - please disregard.

## 2022-06-28 NOTE — Unmapped (Signed)
Dermatology Note     Assessment and Plan:      Concerns for acute cutaneous lupus, no rash apparent on exam today   - Diagnosed with systemic lupus erythematosus (SLE) with secondary to Sjogren's in 2005.   - Currently treating SLE with Plaquenil 200mg  BID, Belimumab 200mg  SQ weekly, and tacrolimus 0.1% ointment. Managed by [facility] (Dr. Tomasa Rand)  - Patient reports that Rheumatology would appreciate second opinion on rash to determine if adjusting lupus medications would be necessary   - Given rash has improved with recent prednisone injection and taper and there is no rash evident on exam today, we will defer biopsy at this time. No indication to escalate therapy at this time.  - Counseled patient if rash recurs, she can return for further consultation and punch biopsy  - Continue triamcinolone 0.1% ointment. Apply to mild-moderate active areas PRN  - Continue clobetasol 0.05% ointment. Apply to severe active areas PRN  - We reviewed proper use and side effects of topical steroids including striae and cutaneous atrophy.    The patient was advised to call for an appointment should any new, changing, or symptomatic lesions develop.     RTC: Return if symptoms worsen or fail to improve. or sooner as needed   _________________________________________________________________      Chief Complaint     Chief Complaint   Patient presents with    Rash     Rash located on face and arms        HPI     Tonya Brady is a 47 y.o. female who presents as a new patient (not seen in the last 3 years) to [facility] for a rash. At last visit, patient was to start clobetasol 0.05% ointment and triamcinolone 0.1% ointment for acute cutaneous lupus.     Today, patient reports to have a rash that mostly cleared. Patient describes her rash appear in a butterfly formation on the face and brown splotching on the neck. She elaborates that rash has impacted her self-image as she became more conscious of her appearance in public. She states her rash resolved from an injection of prednisone and a taper of oral prednisone. Currently treating with triamcinolone ointment when she flares with red lines appearing on her face. Previously treated with clobetasol ointment. She reports a history of lupus since 47 years of age. She states to have high sun exposure in the past from involvement with sports and spending time with family. She confirms to have stopped azathioprine for 3 months. Patient notes to be following with a new rheumatologist,  Dr. Tomasa Rand. Patient states Dr. Tomasa Rand sent tacrolimus, but patient has not heard anything from her pharmacy. She reports to had visited [facility] in the past at the office in [place].    Additionally, patient reports to have a rash that began in the summer of 2023. She states to have noticed a rash of the mid back during her vacation. Patient details the rash spread upwards. 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    The patient denies any other new or changing lesions or areas of concern. .     Pertinent Past Medical History     Sjogren's, SLE and fibromyalgia    Past Medical History, Family History, Social History, Medication List, Allergies, and Problem List were reviewed in the rooming section of Epic.     ROS: Other than symptoms mentioned in the HPI, no fevers, chills,  or other skin complaints    Physical Examination     GENERAL: Well-appearing female in no acute distress, resting comfortably.  NEURO: Alert and oriented, answers questions appropriately  PSYCH: Normal mood and affect  SKIN: Examination of the face, neck, chest, and bilateral upper extremities was performed  - no evidence of rash on exam today     All areas not commented on are within normal limits or unremarkable    Scribe's Attestation: Gentry Fitz Robynn Pane) Natasha Bence, PA-C obtained and performed the history, physical exam and medical decision making elements that were entered into the chart. Signed by Reece Leader, Scribe, on June 29, 2022 at 9:50 AM.    ----------------------------------------------------------------------------------------------------------------------  June 29, 2022 12:03 PM. Documentation assistance provided by the Scribe. I was present during the time the encounter was recorded. The information recorded by the Scribe was done at my direction and has been reviewed and validated by me.  ----------------------------------------------------------------------------------------------------------------------      (Approved Template 02/02/2020)

## 2022-06-29 ENCOUNTER — Ambulatory Visit
Admit: 2022-06-29 | Discharge: 2022-06-30 | Payer: PRIVATE HEALTH INSURANCE | Attending: Student in an Organized Health Care Education/Training Program | Primary: Student in an Organized Health Care Education/Training Program

## 2022-06-29 DIAGNOSIS — R21 Rash and other nonspecific skin eruption: Principal | ICD-10-CM

## 2022-06-29 NOTE — Unmapped (Signed)
Please contact us through MyChart or by phone when your rash is actively flaring . We will schedule an appointment for a punch biopsy.     [facility]  Telephone: [phone]  Fax: [phone]

## 2022-07-10 DIAGNOSIS — M329 Systemic lupus erythematosus, unspecified: Principal | ICD-10-CM

## 2022-07-10 NOTE — Unmapped (Signed)
[facility] Refill Coordination Note    Tonya Brady, DOB: July 24, 1975  Phone: [phone] (home)       All above HIPAA information was verified with patient.         07/06/2022    11:03 PM   Specialty Rx Medication Refill Questionnaire   Which Medications would you like refilled and shipped? Benlysta   Please list all current allergies: Shell fish, aspirin   Have you missed any doses in the last 30 days? No   Have you had any changes to your medication(s) since your last refill? No   How many days remaining of each medication do you have at home? 0   If receiving an injectable medication, next injection date is 07/13/2022   Have you experienced any side effects in the last 30 days? No   Please enter the full address (street address, city, state, zip code) where you would like your medication(s) to be delivered to. [address] [place], [place]  [address]   Please specify on which day you would like your medication(s) to arrive. Note: if you need your medication(s) within 3 days, please call the pharmacy to schedule your order at [phone]  07/11/2022   Has your insurance changed since your last refill? No   Would you like a pharmacist to call you to discuss your medication(s)? No   Do you require a signature for your package? (Note: if we are billing Medicare Part B or your order contains a controlled substance, we will require a signature) No         Completed refill call assessment today to schedule patient's medication shipment from the [facility] [id number]).  All relevant notes have been reviewed.       Confirmed patient received a Conservation officer, historic buildings and a Surveyor, mining with first shipment. The patient will receive a drug information handout for each medication shipped and additional FDA Medication Guides as required.         REFERRAL TO PHARMACIST     Referral to the pharmacist: Not needed      [facility]     Shipping address confirmed in Epic.     Delivery Scheduled: Yes, Expected medication delivery date: 07/11/22.     Medication will be delivered via Same Day Courier to the prescription address in Epic WAM.    Tonya Brady   [facility] Specialty Technician

## 2022-07-11 NOTE — Unmapped (Signed)
Tonya Brady 's BENLYSTA 200 mg/mL Atin (belimumab) shipment will be delayed as a result of a high copay.     I have reached out to the patient  at [phone] and communicated the delay. We will wait for a call back from the patient to reschedule the delivery.  We have not confirmed the new delivery date.

## 2022-07-13 NOTE — Unmapped (Signed)
Tonya Brady 's Benlysta shipment will be delayed as a result of Insurance processing issues.    We will reschedule the medication for the delivery date that the patient agreed upon.  We have confirmed the delivery date as 2/28, via same day courier.

## 2022-07-19 MED FILL — BENLYSTA 200 MG/ML SUBCUTANEOUS AUTO-INJECTOR: SUBCUTANEOUS | 28 days supply | Qty: 4 | Fill #2

## 2022-08-08 DIAGNOSIS — M329 Systemic lupus erythematosus, unspecified: Principal | ICD-10-CM

## 2022-08-08 MED ORDER — BENLYSTA 200 MG/ML SUBCUTANEOUS AUTO-INJECTOR
SUBCUTANEOUS | 2 refills | 28 days | Status: CP
Start: 2022-08-08 — End: ?
  Filled 2022-08-14: qty 4, 28d supply, fill #0

## 2022-08-08 NOTE — Unmapped (Signed)
[facility] Refill Coordination Note    Specialty Medication(s) to be Shipped:   Inflammatory Disorders: Benlysta    Other medication(s) to be shipped: No additional medications requested for fill at this time     Tonya Brady, DOB: June 04, 1975  Phone: [phone] (home)       All above HIPAA information was verified with patient.     Was a Nurse, learning disability used for this call? No    Completed refill call assessment today to schedule patient's medication shipment from the [facility] [phone]).  All relevant notes have been reviewed.     Specialty medication(s) and dose(s) confirmed: Regimen is correct and unchanged.   Changes to medications: Jeannette reports no changes at this time.  Changes to insurance: No  New side effects reported not previously addressed with a pharmacist or physician: None reported  Questions for the pharmacist: No    Confirmed patient received a Conservation officer, historic buildings and a Surveyor, mining with first shipment. The patient will receive a drug information handout for each medication shipped and additional FDA Medication Guides as required.       DISEASE/MEDICATION-SPECIFIC INFORMATION        For patients on injectable medications: Patient currently has 1 doses left.  Next injection is scheduled for 3/21.    SPECIALTY MEDICATION ADHERENCE     Medication Adherence    Patient reported X missed doses in the last month: 0  Specialty Medication: BENLYSTA 200 mg/mL Atin (belimumab)  Patient is on additional specialty medications: No  Informant: patient              Were doses missed due to medication being on hold? No        REFERRAL TO PHARMACIST     Referral to the pharmacist: Not needed      [facility]     Shipping address confirmed in Epic.     Patient was notified of new phone menu : No    Delivery Scheduled: Yes, Expected medication delivery date: 3/25.     Medication will be delivered via Same Day Courier to the prescription address in Epic WAM.    Julianne Rice, PharmD   [facility] Specialty Pharmacist

## 2022-08-08 NOTE — Unmapped (Signed)
Benlysta refill  Last Visit Date: 06/09/2022  Next Visit Date: 09/20/2022    Lab Results   Component Value Date    ALT 21 06/09/2022    AST 11 06/09/2022    ALBUMIN 4.1 06/09/2022    CREATININE 0.70 06/09/2022     Lab Results   Component Value Date    WBC 6.6 06/09/2022    HGB 15.0 (H) 06/09/2022    HCT 44.5 (H) 06/09/2022    PLT 225 06/09/2022     Lab Results   Component Value Date    NEUTROPCT 78.6 06/09/2022    LYMPHOPCT 9.7 06/09/2022    MONOPCT 11.4 06/09/2022    EOSPCT 0.2 06/09/2022    BASOPCT 0.1 06/09/2022

## 2022-08-23 ENCOUNTER — Ambulatory Visit: Admit: 2022-08-23 | Discharge: 2022-08-23 | Payer: PRIVATE HEALTH INSURANCE

## 2022-08-23 DIAGNOSIS — R928 Other abnormal and inconclusive findings on diagnostic imaging of breast: Principal | ICD-10-CM

## 2022-08-23 DIAGNOSIS — Z1231 Encounter for screening mammogram for malignant neoplasm of breast: Principal | ICD-10-CM

## 2022-08-27 ENCOUNTER — Ambulatory Visit: Admit: 2022-08-27 | Discharge: 2022-08-28 | Payer: PRIVATE HEALTH INSURANCE

## 2022-08-27 DIAGNOSIS — M79674 Pain in right toe(s): Principal | ICD-10-CM

## 2022-08-27 NOTE — Unmapped (Signed)
[facility]ARE AT [facility]  Provider Note  08/27/2022    Patient Name: Tonya Brady    Date of Birth: 03/22/1976    MRN: [id number]   Date of Service: 08/27/22     ASSESSMENT/PLAN:  Final diagnoses:   Great toe pain, right (Primary)       Tonya Brady is a 47 y.o. female with PMH of SLE, allergic rhinitis, secondary sjogren's, fibromyalgia who comes in for right great toe swelling and pain after injury yesterday. Was the [place] of a softball game when softball hit great toe during a pitch. Overall well appearing without acute distress. VSS. Swelling without erythema, ecchymosis to the right great toe. Diffuse tenderness to palpation of right great toe. Tenderness to distal 1st MTP. She is neurovascularly intact.     X-ray reviewed by me and radiologist without acute fracture or dislocation.    Discussed buddy taping, ice compress, elevation, NSAIDs, rest. Return precautions given. Patient expresses understanding and agrees to plan. Discharged in stable condition.     New Prescriptions    No medications on file       Patient Disposition  Follow-up with PCP      This note was transcribed using Dragon voice recognition software, and may contain inadvertent misspellings or incorrect transcriptions    ------------------------------------------------------------------------------------------------------------------------------------------------    Chief Complaint   Patient presents with    Toe Injury     Right big toe injury. Patient states that she had a softball hit her toe yesterday.       SUBJECTIVE:     HPI: 47 y.o., female comes in for right great toe pain and swelling after injury.  Was the [place] of a softball game when softball hit her toe yesterday.  Swelling with pain to the great toe that is now radiating down the foot.    Past Medical History:  Past Medical History:   Diagnosis Date    Acute carpal tunnel syndrome of left wrist 11/08/2017    Anemia     Anxiety     Arthritis     Bulging lumbar disc 2005    Cirrhosis of liver (CMS-HCC)     COVID-19 06/18/2019    Depression     Diabetes mellitus (CMS-HCC)     Difficult intravenous access     Fibromyalgia 03/09/2013    Heart murmur     NASH (nonalcoholic steatohepatitis)     Obesity     Pyelonephritis 03/07/2021    Restless leg syndrome 06/30/2013    Secondary Sjogren's syndrome (CMS-HCC) 03/09/2013    Secondary to SLE  Diagnosed: 12/2003 (by sx's, parotid gland enlargement, and serologies) Pathology: biopsy not done Labs: + ANA, anti-SSA, anti-SSB, RF     Systemic lupus erythematosus (CMS-HCC) 01/19/2011    Valvular regurgitation          Past Surgical History:  Past Surgical History:   Procedure Laterality Date    CARPAL TUNNEL RELEASE      CESAREAN SECTION      CHOLECYSTECTOMY      HERNIA REPAIR      HYSTERECTOMY      PR REVISE MEDIAN N/CARPAL TUNNEL SURG Left 12/30/2018    Procedure: R16 NEUROPLASTY AND/OR TRANSPOSITION; MEDIAN NERVE AT CARPAL TUNNEL;  Surgeon: Theodora Blow Jacqlyn Krauss, MD;  Location: [facility] OR [facility];  Service: Orthopedics    PR SALIVARY SURG UNLISTED PROC Right 12/06/2018    Procedure: R22 UNLISTED PROC SALIVARY GLANDS/DUCTS;  Surgeon: Michelle Piper, MD;  Location: [facility] OR  [facility];  Service: ENT    PR SALIVARY SURG UNLISTED PROC Left 02/18/2019    Procedure: R25 UNLISTED PROC SALIVARY GLANDS/DUCTS;  Surgeon: Michelle Piper, MD;  Location: [facility] OR [facility];  Service: ENT    PR UPPER GI ENDOSCOPY,DIAGNOSIS N/A 05/10/2016    Procedure: UGI ENDO, INCLUDE ESOPHAGUS, STOMACH, & DUODENUM &/OR JEJUNUM; DX W/WO COLLECTION SPECIMN, BY BRUSH OR WASH;  Surgeon: Carmon Ginsberg, MD;  Location: [facility];  Service: Gastroenterology    PR UPPER GI ENDOSCOPY,DIAGNOSIS N/A 05/02/2019    Procedure: UGI ENDO, INCLUDE ESOPHAGUS, STOMACH, & DUODENUM &/OR JEJUNUM; DX W/WO COLLECTION SPECIMN, BY BRUSH OR WASH;  Surgeon: Chriss Driver, MD;  Location: [facility]URES [facility]  Service: Gastroenterology    PR UPPER GI ENDOSCOPY,DIAGNOSIS N/A 04/26/2021    Procedure: UGI ENDO, INCLUDE ESOPHAGUS, STOMACH, & DUODENUM &/OR JEJUNUM; DX W/WO COLLECTION SPECIMN, BY BRUSH OR WASH;  Surgeon: Pia Mau, MD;  Location: GI [facility];  Service: Gastroenterology    TOTAL ABDOMINAL HYSTERECTOMY      TUBAL LIGATION          Medications:  Prior to Admission medications    Medication Sig Start Date End Date Taking? Authorizing Provider   aluminum-magnesium hydroxide-simethicone (MAALOX PLUS) 200-200-20 mg/5 mL Susp    Yes [provider]   belimumab (BENLYSTA) 200 mg/mL AtIn Inject the contents of 1 auto-injector (200 mg total) under the skin every seven (7) days. 08/08/22  Yes Lanetta Inch, MD   buPROPion (WELLBUTRIN XL) 300 MG 24 hr tablet Take 1 tablet (300 mg total) by mouth every morning. 06/20/22 06/20/23 Yes Ro, Honor Junes, MD   clobetasoL (TEMOVATE) 0.05 % ointment Use clobetasol ointment twice daily from the neck down, stop after 2 weeks 01/31/22  Yes Modena Jansky, MD   famotidine (PEPCID) 40 MG tablet TAKE 1 TABLET(40 MG) BY MOUTH EVERY NIGHT AS NEEDED FOR HEARTBURN 04/19/21  Yes Alexandria Lodge, MD   fluticasone propionate (FLONASE) 50 mcg/actuation nasal spray 1 spray into each nostril daily. 03/03/20  Yes [name] B, MD   hydrOXYchloroQUINE (PLAQUENIL) 200 mg tablet Take 1 tablet (200 mg total) by mouth Two (2) times a day. 11/08/21  Yes Modena Jansky, MD   ibuprofen (MOTRIN) 800 MG tablet ibuprofen 800 mg tablet   Yes [provider]   naltrexone (DEPADE) 50 mg tablet Take 0.5 tablets (25 mg total) by mouth two (2) times a day. 06/20/22  Yes Ro, Honor Junes, MD   tacrolimus (PROTOPIC) 0.1 % ointment Apply 1 Application topically two (2) times a day. 06/09/22 06/09/23 Yes Lanetta Inch, MD   azaTHIOprine (AZASAN) 100 mg tablet Take 1 tablet (100 mg total) by mouth daily. 02/22/22 08/21/22  Modena Jansky, MD       Allergies:  Allergies   Allergen Reactions    Aspirin Swelling     Other reaction(s): SWELLING/EDEMA    Fish Containing Products Diarrhea and Nausea And Vomiting    Iodine Other (See Comments)     Pt told not to have d/t shellfish allergy    Shellfish Containing Products Hives, Diarrhea and Nausea Only     Other reaction(s): HIVES  Other reaction(s): HIVES  Other reaction(s): NAUSEA        ROS:  10 point ROS reviewed and negative unless otherwise specified in HPI.    OBJECTIVE:  Vitals:    08/27/22 1423   BP: 106/68   Pulse: 58  Resp: 14   Temp: 36.3 ??C (97.3 ??F)   TempSrc: Oral   SpO2: 99%   Weight: 73 kg (161 lb)      Physical Exam  Constitutional:       General: She is not in acute distress.     Appearance: Normal appearance. She is not ill-appearing or toxic-appearing.   HENT:      Head: Normocephalic and atraumatic.   Eyes:      General: Lids are normal.      Extraocular Movements: Extraocular movements intact.      Conjunctiva/sclera: Conjunctivae normal.      Pupils: Pupils are equal, round, and reactive to light.   Pulmonary:      Effort: Pulmonary effort is normal.   Musculoskeletal:      Cervical back: Normal range of motion and neck supple.      Comments: Swelling without erythema, ecchymosis, open wound to the right great toe. Tenderness to distal 1st MTP, and diffusely of the great toe. Decreased ROM of toe. NVI   Skin:     General: Skin is warm and dry.   Neurological:      Mental Status: She is alert and oriented to person, place, and time.          Lab Results: (Lab results reviewed)   No results found for this or any previous visit (from the past 4 hour(s)).     Radiology Results:  XR Foot 3 Or More Views Right    Result Date: 08/27/2022  EXAM: XR FOOT 3 OR MORE VIEWS RIGHT DATE: 08/27/2022 3:16 PM ACCESSION: [id number] UN DICTATED: 08/27/2022 3:19 PM INTERPRETATION LOCATION: [facility]     CLINICAL INDICATION: 47 years old Female with right great toe pain/injury  - M79.674 - Great toe pain, right      COMPARISON: None.     TECHNIQUE: Dorsoplantar, oblique and lateral views of the right foot.     FINDINGS: No acute fracture. Joint alignment is anatomic. Joint spaces are preserved. Soft tissues are unremarkable.         No acute fracture or malalignment.               SCREENING:    SDOH:  No SDOH intervention needed

## 2022-08-27 NOTE — Unmapped (Signed)
Ibuprofen 800mg  every 8 hours for the next 7-10 days  Continue ice compress, elevation  Buddy tape toe during activity, try to wear hard sole shoes for more support    Follow-up with PCP/orthopedics if symptoms not improving

## 2022-08-30 NOTE — Unmapped (Signed)
[facility] Refill Coordination Note    Specialty Medication(s) to be Shipped:   Inflammatory Disorders: Benlysta    Other medication(s) to be shipped: No additional medications requested for fill at this time     Tonya Brady, DOB: [birth year]  Phone: [phone] (home)       All above HIPAA information was verified with patient.     Was a Nurse, learning disability used for this call? No    Completed refill call assessment today to schedule patient's medication shipment from the [facility] [phone]).  All relevant notes have been reviewed.     Specialty medication(s) and dose(s) confirmed: Regimen is correct and unchanged.   Changes to medications: Zaina reports no changes at this time.  Changes to insurance: No  New side effects reported not previously addressed with a pharmacist or physician: None reported  Questions for the pharmacist: No    Confirmed patient received a Conservation officer, historic buildings and a Surveyor, mining with first shipment. The patient will receive a drug information handout for each medication shipped and additional FDA Medication Guides as required.       DISEASE/MEDICATION-SPECIFIC INFORMATION        For patients on injectable medications: Patient currently has 1 doses left.  Next injection is scheduled for 08/31/2022.    SPECIALTY MEDICATION ADHERENCE     Medication Adherence    Patient reported X missed doses in the last month: 0  Specialty Medication: BENLYSTA 200 mg/mL Atin (belimumab)  Patient is on additional specialty medications: No  Patient is on more than two specialty medications: No  Any gaps in refill history greater than 2 weeks in the last 3 months: no  Demonstrates understanding of importance of adherence: yes  Informant: patient  Confirmed plan for next specialty medication refill: delivery by pharmacy  Refills needed for supportive medications: not needed          Refill Coordination    Has the Patients' Contact Information Changed: No  Is the Shipping Address Different: No         Were doses missed due to medication being on hold? No    BENLYSTA 200   mg/ml: 1 days of medicine on hand       REFERRAL TO PHARMACIST     Referral to the pharmacist: Not needed      [facility]     Shipping address confirmed in Epic.       Delivery Scheduled: Yes, Expected medication delivery date: 09/05/2022.     Medication will be delivered via Same Day Courier to the prescription address in Epic WAM.    Kerby Less   [facility] Specialty Technician

## 2022-09-05 MED FILL — BENLYSTA 200 MG/ML SUBCUTANEOUS AUTO-INJECTOR: SUBCUTANEOUS | 28 days supply | Qty: 4 | Fill #1

## 2022-09-20 ENCOUNTER — Ambulatory Visit
Admit: 2022-09-20 | Discharge: 2022-09-21 | Payer: PRIVATE HEALTH INSURANCE | Attending: Student in an Organized Health Care Education/Training Program | Primary: Student in an Organized Health Care Education/Training Program

## 2022-09-20 DIAGNOSIS — M329 Systemic lupus erythematosus, unspecified: Principal | ICD-10-CM

## 2022-09-20 DIAGNOSIS — M67911 Unspecified disorder of synovium and tendon, right shoulder: Principal | ICD-10-CM

## 2022-09-20 LAB — CBC W/ AUTO DIFF
BASOPHILS ABSOLUTE COUNT: 0 10*9/L (ref 0.0–0.1)
BASOPHILS RELATIVE PERCENT: 0.2 %
EOSINOPHILS ABSOLUTE COUNT: 0 10*9/L (ref 0.0–0.5)
EOSINOPHILS RELATIVE PERCENT: 0.6 %
HEMATOCRIT: 39.2 % (ref 34.0–44.0)
HEMOGLOBIN: 13.3 g/dL (ref 11.3–14.9)
LYMPHOCYTES ABSOLUTE COUNT: 0.6 10*9/L — ABNORMAL LOW (ref 1.1–3.6)
LYMPHOCYTES RELATIVE PERCENT: 17.6 %
MEAN CORPUSCULAR HEMOGLOBIN CONC: 33.9 g/dL (ref 32.0–36.0)
MEAN CORPUSCULAR HEMOGLOBIN: 31.7 pg (ref 25.9–32.4)
MEAN CORPUSCULAR VOLUME: 93.4 fL (ref 77.6–95.7)
MEAN PLATELET VOLUME: 10.4 fL (ref 6.8–10.7)
MONOCYTES ABSOLUTE COUNT: 0.4 10*9/L (ref 0.3–0.8)
MONOCYTES RELATIVE PERCENT: 11.7 %
NEUTROPHILS ABSOLUTE COUNT: 2.5 10*9/L (ref 1.8–7.8)
NEUTROPHILS RELATIVE PERCENT: 69.9 %
PLATELET COUNT: 146 10*9/L — ABNORMAL LOW (ref 150–450)
RED BLOOD CELL COUNT: 4.19 10*12/L (ref 3.95–5.13)
RED CELL DISTRIBUTION WIDTH: 12 % — ABNORMAL LOW (ref 12.2–15.2)
WBC ADJUSTED: 3.6 10*9/L (ref 3.6–11.2)

## 2022-09-20 LAB — CREATININE
CREATININE: 0.71 mg/dL
EGFR CKD-EPI (2021) FEMALE: >90 mL/min/{1.73_m2} (ref >=60)

## 2022-09-20 LAB — C4 COMPLEMENT: C4 COMPLEMENT: 24.8 mg/dL (ref 12.0–36.0)

## 2022-09-20 LAB — URINALYSIS WITH MICROSCOPY WITH CULTURE REFLEX
BACTERIA: NONE SEEN /HPF
BILIRUBIN UA: NEGATIVE
BLOOD UA: NEGATIVE
GLUCOSE UA: NEGATIVE
KETONES UA: NEGATIVE
LEUKOCYTE ESTERASE UA: NEGATIVE
NITRITE UA: NEGATIVE
PH UA: 6 (ref 5.0–9.0)
PROTEIN UA: NEGATIVE
RBC UA: <1 /HPF (ref 0–3)
SPECIFIC GRAVITY UA: 1.01 (ref 1.005–1.030)
SQUAMOUS EPITHELIAL: <1 /HPF (ref 0–5)
UROBILINOGEN UA: 0.2
WBC UA: <1 /HPF (ref 0–3)

## 2022-09-20 LAB — HEPATIC FUNCTION PANEL
ALBUMIN: 3.9 g/dL (ref 3.4–5.0)
ALKALINE PHOSPHATASE: 46 U/L (ref 46–116)
ALT (SGPT): 12 U/L (ref 10–49)
AST (SGOT): 15 U/L (ref <=34)
BILIRUBIN DIRECT: 0.1 mg/dL (ref 0.00–0.30)
BILIRUBIN TOTAL: 0.4 mg/dL (ref 0.3–1.2)
PROTEIN TOTAL: 7 g/dL (ref 5.7–8.2)

## 2022-09-20 LAB — C3 COMPLEMENT: C3 COMPLEMENT: 81 mg/dL — ABNORMAL LOW (ref 90–170)

## 2022-09-20 LAB — PROTEIN / CREATININE RATIO, URINE
CREATININE, URINE: 24.1 mg/dL
PROTEIN URINE: <6 mg/dL

## 2022-09-20 MED ORDER — PREDNISONE 10 MG TABLET
ORAL_TABLET | ORAL | 0 refills | 10 days | Status: CP
Start: 2022-09-20 — End: 2022-09-30

## 2022-09-20 NOTE — Unmapped (Signed)
Hi Tonya Brady,    Are you able to see patient early tomorrow morning? If so, what time?    Thanks,  Bear Stearns

## 2022-09-20 NOTE — Unmapped (Signed)
Rheumatology Clinic Follow-up Note     Assessment/Plan:    Tonya Brady is a 47 y.o.  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    In brief, diagnosed 2005 with SLE and secondary Sjogrens which has been characterized by +ANA, +SSA, +SSB, +RF, leukopenia, rashes, arthritis, parotid gland swelling. Currently on HCQ, benlysta, and AZA. See previous medication trials in HPI below.    SLE, Sjogrens: Today, with ongoing cutaneous annular lesions on extremities and on R temporal area without evidence of an active inflammatory arthritis. Otherwise stable. As she has not had a discernible improvement on AZA, and in light of her liver disease, we will discontinue this. Her cutaneous lesions have not readily responded to SLE medication adjustments or topical clobetasol, we also discussed getting her to derm for an evaluation. In meantime, will try topical tacrolimus.  - CBC w/ diff, Cr, hepatic panel, C3, C4, dsDNA  - Cont HCQ 200mg  BID (5mg /kg = 363.5), will discuss dose adjustments pending her course but as I am meeting her for the first time we did not change today  - Cont Belimumab SQ qweekly  - STOP AZA  - Derm Referral  - Start Protopic    Amplified Pain - Mechanical Pain: Worsened in setting of recent decrease in exercise, stress, and poor sleep. We discussed refocusing on general wellness, she is amenable. She is on Wellbutrin 300mg  daily and has had benefit from this.     Healthcare maintenance  - Vaccinations: UTD covid and bivalent vaccination  - HCQ: eye exam Jan 2022 reportedly normal. Overdue and encouraged to shedule this month    No follow-ups on file.    Fleeta Emmer, MD  Assistant Professor of Medicine  Department of Medicine/Division of Rheumatology  [facility]    Primary Care Provider: Alexandria Lodge, MD    HPI:  Tonya Brady is a 47 y.o.  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    Today  Patient reports she has continued to have the cutaneous lesions despite multiple previous prednisone courses. Most recently did a pred dose pack (60mg  starting dose with taper over a week). Improved but has never gone away over past 3 months. Joints and muscles continue to hurt. Tolerated AZA well but neither skin nor joints are discernible changed despite taking this since 01/2022. Otherwise well without CP, SOB, ocular disease.    Does note increased stress lately with grandmother passing away and decreased exercise/poor sleep. Has seen chiropractor and this has helped some, told she has a R rotator cuff tendinopathy.      Disease History:  - 2005 presented with bilateral partoid swelling, sicca, pancytopenia. Hospitalized with small pericardial effusion.  Eval at that time with +ANA 1:320 speckled, +SSA, +SSB, +RF 120, -dsDNA. Initiated HCQ.  - 2007 - 2010 continued PRN prednisone for parotid swelling. Concurrent intermittent antibiotics for concern for concurrent infections. Evaluated in ENT.  - 2009 trial of MTX for parotid swelling. Hospitalized for abdominal pain & elevated LFTs. MTX discontinued. After discontinuation of MTX developed worsening joint pain & swelling of hands.   - 2011 developed ACLE/malar rash after discontinuing HCQ. At that time labs were concerning for +ANA 1:640 speckled, -dsDNA. Restarted HCQ.  - 2012 concern for worsening arthritis pain. Initiated azathioprine  while continued HCQ.  - 2013 patient self-discontinued medications for nausea.  - 2014 re-presented with malar & anterior throat rash for which HCQ was restarted.  - 2015-2017 lost to follow-up. Restarted HCQ. Concern for elevated LFTs for which she was evaluated by GI & underwent liver biopsy which confirmed NAFLD/fibrosis.  - 2018 - 2019 continued on HCQ. Concern for active synovitis on exam promtping steroid taper.   - 2020 belimumab started for persistent arthralgias, new rash. Underwent bilateral sialendoscopies which improved her salivary movement significantly.    - 2021-2022 continued belimumab, HCQ.  - 2023 added back to AZA to HCQ and belimumab due to rash, joint/muscle pains. Patient also felt Belimumab was impairing ability to clear infections.     Treatment:  - Belimumab [2020 - current]  - HCQ [2005 - current; intermittent lapses in medication use - current]  - MTX [2009] - discontinued 2/2 severe GI side effects  - Azathioprine [2012 - 2013]  - Multiple prednisone tapers     Labs:  - As above [not all available dating back to 2005]  - 2019: +SSA/SSB, +RF 30, -CCP     Imaging:  - 2019: Xray hands: Nondisplaced fracture of the distal tuft of the left first distal phalanx. No evidence of erosive or inflammatory arthropathy.  - 2019: Xray L foot: No acute fracture or malalignment. Normal soft tissues.     Review of Systems:  Positive findings noted above, otherwise a 14 point review of systems was reviewed and negative    Past Medical, Surgical, Family and Social History reviewed and updated per EMR     Allergies:  Aspirin, Fish containing products, Iodine, and Shellfish containing products    Medications:     Current Outpatient Medications:     aluminum-magnesium hydroxide-simethicone (MAALOX PLUS) 200-200-20 mg/5 mL Susp, , Disp: , Rfl:     belimumab (BENLYSTA) 200 mg/mL AtIn, Inject the contents of 1 auto-injector (200 mg total) under the skin every seven (7) days., Disp: 4 mL, Rfl: 2    buPROPion (WELLBUTRIN XL) 300 MG 24 hr tablet, Take 1 tablet (300 mg total) by mouth every morning., Disp: 60 tablet, Rfl: 2    clobetasoL (TEMOVATE) 0.05 % ointment, Use clobetasol ointment twice daily from the neck down, stop after 2 weeks, Disp: 60 g, Rfl: 1    famotidine (PEPCID) 40 MG tablet, TAKE 1 TABLET(40 MG) BY MOUTH EVERY NIGHT AS NEEDED FOR HEARTBURN, Disp: 90 tablet, Rfl: 3 fluticasone propionate (FLONASE) 50 mcg/actuation nasal spray, 1 spray into each nostril daily., Disp: 16 g, Rfl: 11    hydrOXYchloroQUINE (PLAQUENIL) 200 mg tablet, Take 1 tablet (200 mg total) by mouth Two (2) times a day., Disp: 180 tablet, Rfl: 3    ibuprofen (MOTRIN) 800 MG tablet, ibuprofen 800 mg tablet, Disp: , Rfl:     naltrexone (DEPADE) 50 mg tablet, Take 0.5 tablets (25 mg total) by mouth two (2) times a day., Disp: 60 tablet, Rfl: 2    tacrolimus (PROTOPIC) 0.1 % ointment, Apply 1 Application topically two (2) times a day., Disp: 100 g, Rfl: 0      Objective   There were no vitals filed for this visit.      Physical Exam  General: well appearing, no acute distress  Eyes: EOMI, normal conjunctivae   ENT: MMM.  Oropharynx without any erythema or exudate.  No oral or nasal ulcers.  Neck: supple. No cervical lymphadenopathy  Cardiovascular: Regular rate and rhythm. Nl S1 and S2.  Pulmonary:  Nl WOB on RA, CTAB.  Skin: Annular lesions on extremities and R temporal area. Scarred areas of previous rash. No other rash, lesions, breakdown. No purpura or petechiae. No digital ulcers.   Extremities: Warm and well perfused, no cyanosis, clubbing or edema  Musculoskeletal: No synovitis or tenderness to palpation in the hands, wrists, elbows, shoulders, knees, ankles, or feet. Full ROM throughout.   Neurologic: Cranial nerves grossly intact, strength 5/5 throughout.  Normal sensation  Psychiatric: Normal mood and affect.    I personally spent 34 minutes face-to-face and non-face-to-face in the care of this patient, which includes all pre, intra, and post visit time on the date of service.  All documented time was specific to the E/M visit and does not include any procedures that may have been performed. E/M visit and does not include any procedures that may have been performed.

## 2022-09-20 NOTE — Unmapped (Signed)
Patient accepted the appointment.    Thank you!

## 2022-09-21 ENCOUNTER — Ambulatory Visit
Admit: 2022-09-21 | Discharge: 2022-09-21 | Payer: PRIVATE HEALTH INSURANCE | Attending: Student in an Organized Health Care Education/Training Program | Primary: Student in an Organized Health Care Education/Training Program

## 2022-09-21 ENCOUNTER — Ambulatory Visit: Admit: 2022-09-21 | Discharge: 2022-09-22 | Payer: PRIVATE HEALTH INSURANCE

## 2022-09-21 ENCOUNTER — Ambulatory Visit: Admit: 2022-09-21 | Discharge: 2022-09-21 | Payer: PRIVATE HEALTH INSURANCE

## 2022-09-21 DIAGNOSIS — K7581 Nonalcoholic steatohepatitis (NASH): Principal | ICD-10-CM

## 2022-09-21 DIAGNOSIS — D492 Neoplasm of unspecified behavior of bone, soft tissue, and skin: Principal | ICD-10-CM

## 2022-09-21 DIAGNOSIS — R21 Rash and other nonspecific skin eruption: Principal | ICD-10-CM

## 2022-09-21 DIAGNOSIS — K7460 Unspecified cirrhosis of liver: Principal | ICD-10-CM

## 2022-09-21 LAB — AFP TUMOR MARKER: AFP-TUMOR MARKER: <2 ng/mL (ref <=8)

## 2022-09-21 MED ORDER — DESONIDE 0.05 % TOPICAL OINTMENT
Freq: Two times a day (BID) | TOPICAL | 3 refills | 0.00000 days | Status: CP
Start: 2022-09-21 — End: 2023-09-21

## 2022-09-21 NOTE — Unmapped (Signed)
[facility]  09/21/2022    Reason for visit: Follow up for Liver cirrhosis secondary to NASH (nonalcoholic steatohepatitis) (CMS-HCC) [K75.81, K74.60]    Assessment/Plan:    47 y.o. 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 She remains compensated.    She is not being considered for transplant due to low MELD.     1. MASH Cirrhosis: Stable, asked her to continue weight loss efforts. She is motivated to do this.   -Stable labs from 02/2022, good liver function     2. EV Screening: Last done 04/2021 with no varices  -Repeat EGD due in Dec 2024, schedule with colonoscopy at that time     3. HCC Screening: Last done 06/2021 with no lesions.   -U/S today shows no evidence of liver lesion, repeat in 6 months  -AFP sent today     4. Health Maintenance:  -Osteoporosis: Increased risk of osteopenia in patients with cirrhosis. Recommend checking Vitamin D level as well as DEXA scan if abnormal.   -Cervical Cancer: Last pap smear done: s/p hysterectomy  -Mammogram: Never done, defer to PCP  -Colon Cancer: Due at age 55, to be done next year with EGD     5.Vaccinations: Recommend the following vaccinations be given in setting of underlying liver disease.  -Hepatitis A: Vaccinated  -Hepatitis B: Vaccinated  -Influenza (yearly): 02/2022  -Pneumococcal: PPSV23 02/2019. Prevnar 20 (02/2021)  -Zoster: Shingrix (age > 50). #1 given today, second dose due in  2-6 months   -SARS-CoV-2: J&J, Pfizer booster, bivalent Pfizer 06/2021, Spikevax given today    No follow-ups on file.  Priscille Heidelberg, NP  [facility]  Subjective   History of Present Illness   47 y.o. 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     Interval history:   Her last visit was on 03/13/2022 with Dr Pervis Hocking.  In the interim, she has continued to have difficulty with cutaneous symptoms of SLE. She will be switched from belimumab to anifrolumab. She also has chronic right shoulder pain, plans to see [facility] orthopedics. She has  not had jaundice, overt confusion, ascites or gi bleeding. No liver related ED visits or hospitalizations.     Objective   Physical Exam   Vital Signs: BP 114/63  - Pulse 59  - Temp 36.5 ??C (97.7 ??F) (Tympanic)  - Ht 152.4 cm (5')  - Wt 76.8 kg (169 lb 6.4 oz)  - SpO2 99%  - BMI 33.08 kg/m??   Constitutional: She is in no apparent distress  HENT: conjunctiva clear, anicteric, nares without discharge, neck supple  CV: Regular rate and rhythm  Lung: respirations even and unlabored  Abdomen: soft, non-distended, non-tender. No palpable ascites.   Extremities: No edema, well perfused  Neuro: No focal deficits. No asterixis.   Mental Status: Thought organized, appropriate affect, engaged in conversation        I personally spent 30 minutes face-to-face and non-face-to-face in the care of this patient, which includes all pre, intra, and post visit time on the date of service

## 2022-09-21 NOTE — Unmapped (Signed)
[facility]  [facility]  Jacquelyne Balint, ANP   [facility]  [address]  [place], [place] [address]  Main Clinic: [phone]  Appointment Schedulers: [phone]  Silvestre Moment, RN: [phone]  Fax: [phone]       Thank you for allowing me to participate in your medical care today. Here are my recommendations based on today's visit:    EGD and colonoscopy due in December 2024. I will put orders in the system in mid September.  You are due for Bone density scan. PCP should order    Take care,  Jacquelyne Balint, ANP        CIRRHOSIS  Cirrhosis means that the liver has a lot of scar tissue, usually caused by many years of injury.   Causes of liver injury include alcohol, fatty liver disease, viruses such as hepatitis C, autoimmune disease, and others.   You should avoid anything that can cause further liver injury, especially alcohol.   Avoid all alcohol, including non-alcoholic beer, medications that include alcohol such as cough/cold/flu medications, mouthwash containing alcohol, cooking with wine, and kombucha.  People with cirrhosis have an increased risk of developing liver cancer and need surveillance.   You should have an ultrasound of your liver every 6 months to look for early signs of liver cancer.  Your body needs protein to heal and avoid further complications. You should eat a diet that includes 1.2-1.5 g/kg of protein each day, or about 80-100 g of protein a day. While red meat is a readily available source of protein, you can get plenty of protein from other sources, including chicken, fish, eggs, legumes, dairy products, and protein supplements (for example, Ensure, Boost, whey protein). Eat small, frequent meals throughout the day. Eat late-night, high-protein snacks.  You should avoid NSAIDs (ibuprofen, naproxen, Advil, Motrin, Aleve, Naprosyn, BC Powder, Goody's Powder, etc).  Acetaminophen (Tylenol) up to 2000 mg a day is ok. This is equivalent to 4 extra strength tablets per day. People with liver disease are often, mistakenly, told to avoid acetaminophen, but when used appropriately, it is the safest pain medication for people with liver disease.  You should get vaccinated for hepatitis A and hepatitis B if you are not already immune.  You should also get the following vaccines:  COVID-19  Annual influenza  Pneumococcal (ie, Prevnar-20)  Shingles (ie, Shingrix)  Tetanus (ie, TDaP)  RSV (if you are 60 years or older)  These vaccines can be obtained in the [facility] and may also be obtained from your primary care provider's office or most pharmacies.

## 2022-09-21 NOTE — Unmapped (Addendum)
For your rash:   - Start desonide ointment. Apply to active areas of the face until smooth.   - Continue triamcinolone ointment. Apply to mild-moderate active areas of body/arms/legs as needed.     We took a biopsy sample today. We will contact you with results in 1-2 weeks. Apply Vaseline and cover with a band aid until it heals. Please refer below for additional post procedure care details.      Punch biopsy  Punch biopsy involves numbing a small area of your skin, then obtaining a sample to help [place] make a proper diagnosis of your skin condition. The biopsy site is typically closed with one to 3 small stitches to help the site heal. Biopsy results will usually return in 7-14 days.    To care for the area: Leave the bandage in place until the morning after your procedure is performed. On a daily basis, carefully remove the bandage, then shower or wash as usual. Allow water to run over the site. Please do not scrub. Carefully dry the area, then apply ointment (some people develop an allergy to Neosporin, so we recommend Vaseline or Aquaphor). Cover the site with a fresh bandage. Should any bleeding occur, apply firm pressure for 15 minutes. The treated site will heal best if  a scab never forms (the wound heals by new skin cells traveling from the outside toward the middle-their journey is easier if no scab stands in their way).    Long-term care: the site will be more sensitive than your surrounding skin. Keep it covered, and remember to apply sunscreen every day to all your exposed skin. A scar may remain which is lighter or pinker than your normal skin. Your body will continue to improve your scar for up to one year.    Infection following this procedure is rare. However, if you are worried about the appearance of your site, contact your doctor. Complete healing of the site may take up to one month. We have a physician on call at all times. If you have any concerns about the site, please call our clinic at [phone]    Your stitches should be removed in 1 to 2 weeks. Typically it is about 1 week for the face and 2 weeks for other areas. Please follow the time frame given to you at your appointment.

## 2022-09-21 NOTE — Unmapped (Signed)
Per provider, the patient received shingles and covid vaccine.  Patient ID verified with name and date of birth.  All screening questions were answered.  Vaccine(s) were administered as ordered.  See immunization history for documentation.  Patient tolerated the injection(s) well with no issues noted.  Vaccine Information sheet given to the patient.

## 2022-09-21 NOTE — Unmapped (Signed)
Dermatology Note     Assessment and Plan:      Rash and nonspecific skin eruption, concern for cutaneous lupus:   - Diagnosis, treatment options, prognosis, risk/ benefit, and side effects of treatment were discussed with the patient.   - Diagnosed with systemic lupus erythematosus (SLE) and secondary Sjogren's in 2005.   - Currently treating SLE with Plaquenil 200mg  BID, Belimumab 200mg  SQ weekly, and tacrolimus 0.1% ointment. Managed by [facility] (Dr. Tomasa Rand)  - Rash appears most consistent with CLE on exam today, but we will biopsy to confirm as this may influence her systemic management with rheumatology. See procedure note below.   - Start desonide 0.05% ointment; Apply to active areas of the face until smooth.  - Continue triamcinolone 0.1% ointment; Apply to mild-moderate active areas until improved or for up to 2 weeks, restart as needed.   - Continue clobetasol 0.05% ointment; Apply to severe active areas twice daily until improved or for up to two weeks, restart as needed  - We reviewed proper use and side effects of topical steroids including striae and cutaneous atrophy.  - Discussed it is ok to to start prednisone course as prescribed by outside provider.   - We reviewed the importance of sun protection.   Biopsy (Punch) Procedure Note:   After discussion of risks, benefits, and alternatives including but not limited to risk of scarring, recurrence of lesion, and potential need for other therapy depending on diagnosis, consent was obtained. Time Out verification of patient, procedure, and site was performed. When applicable, safety precautions based on patient's medical history or medication use and review of relevant images and results was also performed as part of the Time Out. The area was prepped with alcohol and anesthetized with lidocaine 2% with epinephrine. Biopsy was performed using a punch technique with a 4 mm punch. Hemostasis and closure were achieved in the usual fashion with pressure and 4-0 nylon suture. Bandage was applied and instructions were provided. We will contact the patient with results when available. Patient agrees to be notified of results by MyChart - even if needs further management.   A) Location: right forearm; Ddx: cutaneous lupus vs other  Suture removal plan: 2 weeks at home, suture removal kit provided    The patient was advised to call for an appointment should any new, changing, or symptomatic lesions develop.     RTC: Return for pending path. or sooner as needed   _________________________________________________________________      Chief Complaint     Follow up of rash    HPI     Tonya Brady is a 47 y.o. female who presents as a returning patient (last seen by Lowry Bowl, PA-C on 06/29/2022) to Dermatology for follow up of rash. At last visit, patient was to continue triamcinolone ointment and continue clobetasol ointment for a rash.     Rash  - reports rash on arms are persistent   - currently taking prednisone    The patient denies any other new or changing lesions or areas of concern.     Pertinent Past Medical History     No history of skin cancer    Sjogren's, SLE and fibromyalgia    Family History:   Negative for melanoma    Past Medical History, Family History, Social History, Medication List, Allergies, and Problem List were reviewed in the rooming section of Epic.     ROS: Other than symptoms mentioned in the HPI, no fevers, chills, or other  skin complaints    Physical Examination     GENERAL: Well-appearing female in no acute distress, resting comfortably.  NEURO: Alert and oriented, answers questions appropriately  PSYCH: Normal mood and affect  SKIN: Examination of the face and bilateral upper extremities was performed  - erythematous papules and plaques of bilateral forearms   - erythematous patches on bilateral cheeks       Site (Biopsy A): Forearm, Right   Differential (Biopsy A): Rash - Uncertain etiology   Neoplasia (Biopsy A): cutaneous lupus vs other  Size of Lesion (Biopsy A): 4 mm   Triangulation (Biopsy A): --   Type of biopsy (Biopsy A): Punch   Punch Size (Biopsy A): 4 mm   Description (Biopsy A): erythematous plaques, patient with hx of SLE     All areas not commented on are within normal limits or unremarkable    Scribe's Attestation: Gentry Fitz Robynn Pane) Natasha Bence, PA-C obtained and performed the history, physical exam and medical decision making elements that were entered into the chart. Signed by Sander Nephew, Scribe, on Sep 21, 2022 at 8:38 AM.    ----------------------------------------------------------------------------------------------------------------------  Sep 21, 2022 12:46 PM. Documentation assistance provided by the Scribe. I was present during the time the encounter was recorded. The information recorded by the Scribe was done at my direction and has been reviewed and validated by me.  ----------------------------------------------------------------------------------------------------------------------      (Approved Template 02/02/2020)

## 2022-09-22 LAB — ANTI-DNA ANTIBODY, DOUBLE-STRANDED: DSDNA ANTIBODY: NEGATIVE

## 2022-09-22 LAB — VITAMIN D 25 HYDROXY: VITAMIN D, TOTAL (25OH): 22.7 ng/mL (ref 20.0–80.0)

## 2022-09-28 ENCOUNTER — Ambulatory Visit
Admit: 2022-09-28 | Discharge: 2022-09-29 | Payer: PRIVATE HEALTH INSURANCE | Attending: Family Medicine | Primary: Family Medicine

## 2022-09-28 DIAGNOSIS — K7460 Unspecified cirrhosis of liver: Principal | ICD-10-CM

## 2022-09-28 DIAGNOSIS — E669 Obesity, unspecified: Principal | ICD-10-CM

## 2022-09-28 DIAGNOSIS — K7581 Nonalcoholic steatohepatitis (NASH): Principal | ICD-10-CM

## 2022-09-28 MED ORDER — BUPROPION HCL XL 300 MG 24 HR TABLET, EXTENDED RELEASE
ORAL_TABLET | Freq: Every morning | ORAL | 2 refills | 60 days | Status: CP
Start: 2022-09-28 — End: 2023-09-28

## 2022-09-28 MED ORDER — NALTREXONE 50 MG TABLET
ORAL_TABLET | Freq: Two times a day (BID) | ORAL | 2 refills | 60 days | Status: CP
Start: 2022-09-28 — End: ?

## 2022-09-28 MED ORDER — OZEMPIC 0.25 MG OR 0.5 MG (2 MG/3 ML) SUBCUTANEOUS PEN INJECTOR
SUBCUTANEOUS | 2 refills | 28 days | Status: CP
Start: 2022-09-28 — End: 2022-10-28

## 2022-09-28 NOTE — Unmapped (Signed)
[facility] Follow Up    Assessment/Plan:     Chief Complaint   Patient presents with    Follow-up     Wt mngmt       Problem List Items Addressed This Visit          Unprioritized    Liver cirrhosis secondary to NASH (nonalcoholic steatohepatitis) (CMS-HCC) - Primary     Pt followed by hepatology closely. Reviewed relevant medications and labs. Pt is following our [facility]. See Obesity tab for medication changes.          Relevant Medications    semaglutide (OZEMPIC) 0.25 mg or 0.5 mg (2 mg/3 mL) PnIj    Class 1 obesity     Tonya Brady is a 47 y.o. 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 Her medical history includes SLE, cirrhosis, GERD, suspected OSA, depression, and emotional / stress eating. Pt wishes weight loss to prevent further liver damage.    Weight Summary:  Starting weight/BMI/WC: 209 lb, BMI 39.6 (06/13/19, self report), WC 39 (11/04/19, 1st measure)  Target weight/goal: BMI 30 or 159 lb.  Today's weight/BMI: 76.7 kg (169 lb 3.2 oz), BMI (!) 33.04 (09/28/2022)   % Body weight loss: 20.6%  Today's Waist Circumference: 35.5 inches (09/28/2022) <= 38.75 (01/07/20)    Referred by: [name], MD in [facility].    GOALS: 1) Continue physical activity as able with back pain  2) Continue limiting eating out  3) Restart Ozempic    I have reviewed the patient's medical history, lifestyle history and labs/tests.   My recommendations include the following:    Lifestyle Pattern Summary: Doing ok with lifestyle choices, doing some fast food/takeout/restaurant food. Back pain is a major barrier to physical activity, trying to walk.  - See GOALS above.     Weight gain causing medication: None.   Hx of long term steroid use (prednisone 80 mg daily for lupus).  Hx of Ambien for insomnia years ago.    Medication: Taking wellbutrin and naltrexone as directed. Tolerating well. Denies s/e. Continue regimen. Due to liver cirrhosis and weight regain, discussed and decided to send Ozempic again. Prescription sent  START Ozempic 0.25 mg once weekly injections, CONTINUE  Naltrexone 25 mg BID, CONTINUE  Wellbutrin 300 mg QD  Tried  - Orlistat: tried and failed.  - Ozempic -- good WL, but insurance denial   - Mounjaro -- 16 lb WL, but pharmacy denial  Contraindicated  - Topamax: contraindicated dhx of kidney stone.  - Phentermine: contraindicated by hx of palpitations.    Obesity Surgery: Pt does not meet criteria based on BMI.    Medical conditions:  Glaucoma: no  Seizures: no  Medullary thyroid cancer (personal or family hx): Father and PGM both had thyroid goiter. Multiple Endocrine Neoplasia: no  Palpitations/Tachycardia: YES  Chest Pain: no past history of MI.   Headaches/Migraines: YES  Nephrolithiasis: YES  H/o pancreatitis: YES, gallstone pancreatitis and she had cholecystectomy.  GERD: YES    Prior Surgeries:  Choleycystectomy: YES  Hysterectomy: YES    Birth Control Methods: N/A; s/p hysterectomy.             Return in about 3 months (around 12/29/2022) for Weight clinic F/U one slot..    I have reviewed and addressed the patient???s adherence and response to prescribed medications. I have identified patient barriers to following the proposed medication and treatment plan,  and have noted opportunities to optimize healthy behaviors. I have answered the patient???s questions to satisfaction and the patient voices understanding.    Time: Greater than 50% of this encounter was spent in direct consultation with the patient in evaluation and discussing all of the above. Duration of encounter: 30 minutes.     HPI:     Tonya Brady is a 47 y.o. female who  has a past medical history of Acute carpal tunnel syndrome of left wrist (11/08/2017), Anemia, Anxiety, Arthritis, Bulging lumbar disc (2005), Cirrhosis of liver (CMS-HCC), COVID-19 (06/18/2019), Depression, Diabetes mellitus (CMS-HCC), Difficult intravenous access, Fibromyalgia (03/09/2013), Heart murmur, NASH (nonalcoholic steatohepatitis), Obesity, Pyelonephritis (03/07/2021), Restless leg syndrome (06/30/2013), Secondary Sjogren's syndrome (CMS-HCC) (03/09/2013), Systemic lupus erythematosus (CMS-HCC) (01/19/2011), and Valvular regurgitation. who presents today for [name] follow up.    Weight Management History  Wt Readings from Last 6 Encounters:   09/28/22 76.7 kg (169 lb 3.2 oz)   09/21/22 76.8 kg (169 lb 6.4 oz)   09/20/22 77.4 kg (170 lb 9.6 oz)   08/27/22 73 kg (161 lb)   06/20/22 73.1 kg (161 lb 3.2 oz)   06/09/22 72.7 kg (160 lb 3.2 oz)         12/07/2020    10:14 AM 02/17/2021     1:50 PM 07/19/2021     9:00 AM 10/20/2021     9:05 AM 02/14/2022     9:52 AM 06/20/2022     9:46 AM 09/28/2022     1:46 PM   Waist Circumference   Waist Circumference 36 inches 37 inches 36 inches 32.5 inches 34 inches 33.8 inches 35.5 inches       Update: 8 lb gain in 4 mos. Back pain is a major barrier. Pt is also on prednisone right now.   Medication: Taking Welbutrin and naltrexone as directed.   Eating Pattern:  - Breakfast: Eggs, [place] yogurt, and Fast food 1 times per week  - Lunch: Salad, Sandwich, and Leftovers  - Dinner: Salad, Chicken, [employer], Veggies, and Fast food/restaurant/takeout 3 times per week  - Drinks: Water and Coffee  - Snacks: Fruit, Granola Bars, Nuts, and Crackers/cheese  Physical Activity:  - Type: Walk, Frequency: 60+ mins 2-4 times per week   Barrier: lupus, back pain    Lab Results   Component Value Date    LDL 86 03/16/2022    HDL 39 (L) 03/16/2022    A1C 5.4 03/03/2020    GLU 88 03/16/2022    TSH 2.500 11/10/2015    VITDTOTAL 22.7 09/20/2022    AST 15 09/20/2022    ALT 12 09/20/2022     Past Medical/Surgical History:     Past Medical History:   Diagnosis Date    Acute carpal tunnel syndrome of left wrist 11/08/2017    Anemia     Anxiety     Arthritis     Bulging lumbar disc 2005    Cirrhosis of liver (CMS-HCC)     COVID-19 06/18/2019    Depression     Diabetes mellitus (CMS-HCC) Difficult intravenous access     Fibromyalgia 03/09/2013    Heart murmur     NASH (nonalcoholic steatohepatitis)     Obesity     Pyelonephritis 03/07/2021    Restless leg syndrome 06/30/2013    Secondary Sjogren's syndrome (CMS-HCC) 03/09/2013    Secondary to SLE  Diagnosed: 12/2003 (by sx's, parotid gland enlargement, and serologies) Pathology: biopsy not done Labs: + ANA, anti-SSA, anti-SSB,  RF     Systemic lupus erythematosus (CMS-HCC) 01/19/2011    Valvular regurgitation      Past Surgical History:   Procedure Laterality Date    CARPAL TUNNEL RELEASE      CESAREAN SECTION      CHOLECYSTECTOMY      HERNIA REPAIR      HYSTERECTOMY      PR REVISE MEDIAN N/CARPAL TUNNEL SURG Left 12/30/2018    Procedure: R16 NEUROPLASTY AND/OR TRANSPOSITION; MEDIAN NERVE AT CARPAL TUNNEL;  Surgeon: Theodora Blow Jacqlyn Krauss, MD;  Location: [facility] OR [facility];  Service: Orthopedics    PR SALIVARY SURG UNLISTED PROC Right 12/06/2018    Procedure: R22 UNLISTED PROC SALIVARY GLANDS/DUCTS;  Surgeon: Michelle Piper, MD;  Location: [facility] OR [facility];  Service: ENT    PR SALIVARY SURG UNLISTED PROC Left 02/18/2019    Procedure: R25 UNLISTED PROC SALIVARY GLANDS/DUCTS;  Surgeon: Michelle Piper, MD;  Location: [facility] OR [facility];  Service: ENT    PR UPPER GI ENDOSCOPY,DIAGNOSIS N/A 05/10/2016    Procedure: UGI ENDO, INCLUDE ESOPHAGUS, STOMACH, & DUODENUM &/OR JEJUNUM; DX W/WO COLLECTION SPECIMN, BY BRUSH OR WASH;  Surgeon: Carmon Ginsberg, MD;  Location: [facility];  Service: Gastroenterology    PR UPPER GI ENDOSCOPY,DIAGNOSIS N/A 05/02/2019    Procedure: UGI ENDO, INCLUDE ESOPHAGUS, STOMACH, & DUODENUM &/OR JEJUNUM; DX W/WO COLLECTION SPECIMN, BY BRUSH OR WASH;  Surgeon: Chriss Driver, MD;  Location: [facility];  Service: Gastroenterology    PR UPPER GI ENDOSCOPY,DIAGNOSIS N/A 04/26/2021    Procedure: UGI ENDO, INCLUDE ESOPHAGUS, STOMACH, & DUODENUM &/OR JEJUNUM; DX W/WO COLLECTION SPECIMN, BY BRUSH OR WASH;  Surgeon: Pia Mau, MD;  Location: GI [facility];  Service: Gastroenterology    TOTAL ABDOMINAL HYSTERECTOMY      TUBAL LIGATION         Social History:     Social History     Socioeconomic History    Marital status: Married     Spouse name: None    Number of children: None    Years of education: None    Highest education level: None   Tobacco Use    Smoking status: Never    Smokeless tobacco: Never   Vaping Use    Vaping status: Never Used   Substance and Sexual Activity    Alcohol use: No     Alcohol/week: 0.0 standard drinks of alcohol    Drug use: Never    Sexual activity: Not Currently     Partners: Male   Other Topics Concern    Do you use sunscreen? Yes    Tanning bed use? No    Are you easily burned? No    Excessive sun exposure? No    Blistering sunburns? No     Social Determinants of Health     Financial Resource Strain: Low Risk  (10/15/2019)    Overall Financial Resource Strain (CARDIA)     Difficulty of Paying Living Expenses: Not hard at all   Food Insecurity: No Food Insecurity (10/15/2019)    Hunger Vital Sign     Worried About Running Out of Food in the Last Year: Never true     Ran Out of Food in the Last Year: Never true   Transportation Needs: No Transportation Needs (11/14/2019)    PRAPARE - Therapist, art (Medical): No     Lack of Transportation (Non-Medical): No   Physical Activity:  Insufficiently Active (11/14/2019)    Exercise Vital Sign     Days of Exercise per Week: 3 days     Minutes of Exercise per Session: 30 min   Stress: Stress Concern Present (11/14/2019)    [employer] of Occupational Health - Occupational Stress Questionnaire     Feeling of Stress : To some extent   Social Connections: Unknown (11/14/2019)    Social Connection and Isolation Panel [NHANES]     Frequency of Communication with Friends and Family: Three times a week     Frequency of Social Gatherings with Friends and Family: Three times a week     Attends Religious Services: Never     Active Member of Clubs or Organizations: No     Attends Banker Meetings: Never       Family History:     Family History   Problem Relation Age of Onset    Cancer Mother     Hypertension Mother     Alcohol abuse Mother     Arthritis Mother     Heart disease Father     Heart attack Father     Alcohol abuse Father     Diabetes Maternal Grandmother     Hypertension Maternal Grandmother     Thyroid disease Maternal Grandmother     Breast cancer Maternal Grandmother     Hearing loss Maternal Grandmother     Breast cancer Paternal Grandmother     Diabetes Paternal Grandmother     Thyroid disease Paternal Grandmother     Arthritis Paternal Grandmother     Cancer Paternal Grandmother     Depression Son     Mental illness Son     Breast cancer Maternal Aunt     Aneurysm Maternal Aunt     Diabetes Maternal Aunt     Diabetes Maternal Uncle     Heart attack Maternal Uncle     Alcohol abuse Maternal Grandfather     Alcohol abuse Paternal Grandfather     Cancer Paternal Grandfather     Liver disease Paternal Grandfather     Depression Son     Mental illness Son     Learning disabilities Son     Diabetes Maternal Uncle     Drug abuse Maternal Aunt     Drug abuse Maternal Aunt     Drug abuse Maternal Uncle     Drug abuse Maternal Uncle     Mental illness Paternal Uncle     Lupus Maternal Aunt     Seizures Maternal Aunt     Seizures Son     Seizures Daughter     Melanoma Neg Hx     Basal cell carcinoma Neg Hx     Squamous cell carcinoma Neg Hx     Stroke Neg Hx        Allergies:     Aspirin, Fish containing products, Iodine, and Shellfish containing products    Current Medications:     Current Outpatient Medications   Medication Sig Dispense Refill    aluminum-magnesium hydroxide-simethicone (MAALOX PLUS) 200-200-20 mg/5 mL Susp       belimumab (BENLYSTA) 200 mg/mL AtIn Inject the contents of 1 auto-injector (200 mg total) under the skin every seven (7) days. 4 mL 2 clobetasoL (TEMOVATE) 0.05 % ointment Use clobetasol ointment twice daily from the neck down, stop after 2 weeks 60 g 1    desonide (DESOWEN) 0.05 % ointment Apply topically two (2) times a day. To  active areas of the face. Use until improved or for up to 2 weeks. If not improving, increase to triamcinolone. 60 g 3    famotidine (PEPCID) 40 MG tablet TAKE 1 TABLET(40 MG) BY MOUTH EVERY NIGHT AS NEEDED FOR HEARTBURN 90 tablet 3    fluticasone propionate (FLONASE) 50 mcg/actuation nasal spray 1 spray into each nostril daily. 16 g 11    hydrOXYchloroQUINE (PLAQUENIL) 200 mg tablet Take 1 tablet (200 mg total) by mouth Two (2) times a day. 180 tablet 3    ibuprofen (MOTRIN) 800 MG tablet ibuprofen 800 mg tablet      buPROPion (WELLBUTRIN XL) 300 MG 24 hr tablet Take 1 tablet (300 mg total) by mouth every morning. 60 tablet 2    naltrexone (DEPADE) 50 mg tablet Take 0.5 tablets (25 mg total) by mouth two (2) times a day. 60 tablet 2    semaglutide (OZEMPIC) 0.25 mg or 0.5 mg (2 mg/3 mL) PnIj Inject 0.5 mg under the skin every seven (7) days. 3 mL 2     No current facility-administered medications for this visit.       I have reviewed and (if needed) updated the patient's problem list, medications, allergies, past medical and surgical history, social and family history.    ROS:     Unless otherwise stated in the HPI:  CONSTITUTIONAL: no fever chills.   HEENT: Eyes: No diplopia or blurred vision. ENT: No earache, sore throat or runny nose.   CARDIOVASCULAR: No pressure, squeezing, strangling, tightness, heaviness or aching about the chest, neck, axilla or epigastrium.   RESPIRATORY: No cough, shortness of breath, PND or orthopnea.   GASTROINTESTINAL: No nausea, vomiting or diarrhea.   GENITOURINARY: No dysuria, frequency or urgency.   MUSCULOSKELETAL: no new pains, no joint swelling or redness.   SKIN: No change in skin, hair or nails.   NEUROLOGIC: No paresthesias, fasciculations, seizures or weakness.   PSYCHIATRIC: No disorder of thought or mood.   ENDOCRINE: No heat or cold intolerance, polyuria or polydipsia.   HEMATOLOGICAL: No easy bruising or bleeding.    Vital Signs:     Body mass index is 33.04 kg/m??. Waist Circumference: 35.5 inches    Wt Readings from Last 3 Encounters:   09/28/22 76.7 kg (169 lb 3.2 oz)   09/21/22 76.8 kg (169 lb 6.4 oz)   09/20/22 77.4 kg (170 lb 9.6 oz)     Temp Readings from Last 3 Encounters:   09/28/22 36.2 ??C (97.1 ??F)   09/21/22 36.5 ??C (97.7 ??F) (Tympanic)   09/20/22 36.6 ??C (97.9 ??F) (Temporal)     BP Readings from Last 3 Encounters:   09/28/22 106/70   09/21/22 114/63   09/20/22 115/56     Pulse Readings from Last 3 Encounters:   09/28/22 61   09/21/22 59   09/20/22 66         12/07/2020    10:14 AM 02/17/2021     1:50 PM 07/19/2021     9:00 AM 10/20/2021     9:05 AM 02/14/2022     9:52 AM 06/20/2022     9:46 AM 09/28/2022     1:46 PM   Waist Circumference   Waist Circumference 36 inches 37 inches 36 inches 32.5 inches 34 inches 33.8 inches 35.5 inches          Physical Exam:     General: well appearing, in NAD, Body mass index is 33.04 kg/m??. Ambulatory without help.  Body fat distribution: General  adiposity. No supraclavicular adiposity. No dorsal adiposity. Waist Circumference: 35.5 inches     Head: normocephalic atraumatic.  Eyes: PERRLA, EOMI, Sclera WNL.   Oral pharynx: moist, no exudate, no erythema, not enlarged. Good dental hygiene. Moderate OP crowding.  Neck: supple, no LAD, no thyromegaly, no bruit.  CV: RRR no rubs or murmurs. Peripheral edema: none.  Lungs: clear bilaterally to auscultation. No wheezing.  Abdomen: soft, NT/ND. No intertrigo. Moderate pannus. No hepatomegaly.   Extremities: no clubbing, cyanosis, No edema.  Skin: no concerning lesions, rashes observed. No lipomas, no acanthosis nigricans.  Neuro: Alert and oriented X 3.    Labs:     Office Visit on 09/21/2022   Component Date Value Ref Range Status    Case Report 09/21/2022    Final                    Value:Surgical Pathology Report                         Case: [id number]                                 Authorizing Provider:  Reva Bores, [place] Collected:           09/21/2022 0981              Ordering Location:     [facility]   Received:            09/22/2022 0555                                     [facility]                                                    Pathologist:           Abbie Sons, MD                                                        Specimen:    Skin, Right forearm, punch                                                                 Diagnosis 09/21/2022    Final                    Value:This result contains rich text formatting which cannot be displayed here.    Diagnosis Comment 09/21/2022    Final                    Value:This result contains rich text formatting which cannot be displayed here.    Clinical History 09/21/2022    Final  Value:This result contains rich text formatting which cannot be displayed here.    Gross Description 09/21/2022    Final                    Value:This result contains rich text formatting which cannot be displayed here.    Microscopic Description 09/21/2022    Final                    Value:This result contains rich text formatting which cannot be displayed here.   Office Visit on 09/20/2022   Component Date Value Ref Range Status    Creatinine 09/20/2022 0.71  0.55 - 1.02 mg/dL Final    eGFR CKD-EPI (2021) Female 09/20/2022 >90  >=60 mL/min/1.52m2 Final    eGFR calculated with CKD-EPI 2021 equation in accordance with [employer] and [employer] of Nephrology Task Force recommendations.    Albumin 09/20/2022 3.9  3.4 - 5.0 g/dL Final    Total Protein 09/20/2022 7.0  5.7 - 8.2 g/dL Final    Total Bilirubin 09/20/2022 0.4  0.3 - 1.2 mg/dL Final    Bilirubin, Direct 09/20/2022 0.10  0.00 - 0.30 mg/dL Final    AST 16/02/9603 15  <=34 U/L Final    ALT 09/20/2022 12  10 - 49 U/L Final    Alkaline Phosphatase 09/20/2022 46  46 - 116 U/L Final    Color, UA 09/20/2022 Yellow   Final    Clarity, UA 09/20/2022 Clear   Final    Specific Gravity, UA 09/20/2022 1.010  1.005 - 1.030 Final    pH, UA 09/20/2022 6.0  5.0 - 9.0 Final    Leukocyte Esterase, UA 09/20/2022 Negative  Negative Final    Nitrite, UA 09/20/2022 Negative  Negative Final    Protein, UA 09/20/2022 Negative  Negative Final    Glucose, UA 09/20/2022 Negative  Negative Final    Ketones, UA 09/20/2022 Negative  Negative Final    Urobilinogen, UA 09/20/2022 0.2 mg/dL  0.2 - 2.0 mg/dL Final    Bilirubin, UA 09/20/2022 Negative  Negative Final    Blood, UA 09/20/2022 Negative  Negative Final    RBC, UA 09/20/2022 <1  0 - 3 /HPF Final    WBC, UA 09/20/2022 <1  0 - 3 /HPF Final    Squam Epithel, UA 09/20/2022 <1  0 - 5 /HPF Final    Bacteria, UA 09/20/2022 None Seen  None Seen /HPF Final    Creat U 09/20/2022 24.1  Undefined mg/dL Final    Protein, Ur 54/01/8118 <6.0  Undefined mg/dL Final    Protein/Creatinine Ratio, Urine 09/20/2022    Final    Unable to calculate.    C3 Complement 09/20/2022 81 (L)  90 - 170 mg/dL Final    C4 Complement 09/20/2022 24.8  12.0 - 36.0 mg/dL Final    dsDNA Ab 14/78/2956 Negative  Negative Final    WBC 09/20/2022 3.6  3.6 - 11.2 10*9/L Final    RBC 09/20/2022 4.19  3.95 - 5.13 10*12/L Final    HGB 09/20/2022 13.3  11.3 - 14.9 g/dL Final    HCT 21/30/8657 39.2  34.0 - 44.0 % Final    MCV 09/20/2022 93.4  77.6 - 95.7 fL Final    MCH 09/20/2022 31.7  25.9 - 32.4 pg Final    MCHC 09/20/2022 33.9  32.0 - 36.0 g/dL Final    RDW 84/69/6295 12.0 (L)  12.2 - 15.2 % Final    MPV 09/20/2022  10.4  6.8 - 10.7 fL Final    Platelet 09/20/2022 146 (L)  150 - 450 10*9/L Final    Neutrophils % 09/20/2022 69.9  % Final    Lymphocytes % 09/20/2022 17.6  % Final    Monocytes % 09/20/2022 11.7  % Final    Eosinophils % 09/20/2022 0.6  % Final    Basophils % 09/20/2022 0.2  % Final    Absolute Neutrophils 09/20/2022 2.5  1.8 - 7.8 10*9/L Final Absolute Lymphocytes 09/20/2022 0.6 (L)  1.1 - 3.6 10*9/L Final    Absolute Monocytes 09/20/2022 0.4  0.3 - 0.8 10*9/L Final    Absolute Eosinophils 09/20/2022 0.0  0.0 - 0.5 10*9/L Final    Absolute Basophils 09/20/2022 0.0  0.0 - 0.1 10*9/L Final    AFP-Tumor Marker 09/20/2022 <2  <=8 ng/mL Final    Vitamin D Total (25OH) 09/20/2022 22.7  20.0 - 80.0 ng/mL Final       Follow-up:     Return in about 3 months (around 12/29/2022) for Weight clinic F/U one slot..    I attest that I, Constance Goltz, personally documented this note while acting as scribe for Marshell Garfinkel, MD.      Constance Goltz, Scribe.  09/28/2022     The documentation recorded by the scribe accurately reflects the service I personally performed and the decisions made by me.    Marshell Garfinkel, MD

## 2022-09-28 NOTE — Unmapped (Addendum)
Tonya Brady is a 47 y.o. 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 Her medical history includes SLE, cirrhosis, GERD, suspected OSA, depression, and emotional / stress eating. Pt wishes weight loss to prevent further liver damage.    Weight Summary:  Starting weight/BMI/WC: 209 lb, BMI 39.6 (06/13/19, self report), WC 39 (11/04/19, 1st measure)  Target weight/goal: BMI 30 or 159 lb.  Today's weight/BMI: 76.7 kg (169 lb 3.2 oz), BMI (!) 33.04 (09/28/2022)   % Body weight loss: 20.6%  Today's Waist Circumference: 35.5 inches (09/28/2022) <= 38.75 (01/07/20)    Referred by: [name], MD in [facility].    GOALS: 1) Continue physical activity as able with back pain  2) Continue limiting eating out  3) Restart Ozempic    I have reviewed the patient's medical history, lifestyle history and labs/tests.   My recommendations include the following:    Lifestyle Pattern Summary: Doing ok with lifestyle choices, doing some fast food/takeout/restaurant food. Back pain is a major barrier to physical activity, trying to walk.  - See GOALS above.     Weight gain causing medication: None.   Hx of long term steroid use (prednisone 80 mg daily for lupus).  Hx of Ambien for insomnia years ago.    Medication: Taking wellbutrin and naltrexone as directed. Tolerating well. Denies s/e. Continue regimen. Due to liver cirrhosis and weight regain, discussed and decided to send Ozempic again. Prescription sent  START Ozempic 0.25 mg once weekly injections, CONTINUE  Naltrexone 25 mg BID, CONTINUE  Wellbutrin 300 mg QD  Tried  - Orlistat: tried and failed.  - Ozempic -- good WL, but insurance denial   - Mounjaro -- 16 lb WL, but pharmacy denial  Contraindicated  - Topamax: contraindicated dhx of kidney stone.  - Phentermine: contraindicated by hx of palpitations.    Obesity Surgery: Pt does not meet criteria based on BMI.    Medical conditions:  Glaucoma: no  Seizures: no  Medullary thyroid cancer (personal or family hx): Father and PGM both had thyroid goiter. Multiple Endocrine Neoplasia: no  Palpitations/Tachycardia: YES  Chest Pain: no past history of MI.   Headaches/Migraines: YES  Nephrolithiasis: YES  H/o pancreatitis: YES, gallstone pancreatitis and she had cholecystectomy.  GERD: YES    Prior Surgeries:  Choleycystectomy: YES  Hysterectomy: YES    Birth Control Methods: N/A; s/p hysterectomy.

## 2022-10-02 NOTE — Unmapped (Signed)
[facility] - Pharmacist Counseling Notes    Tonya Brady is a 47 y.o. female being initiated on (anifrolumab) Saphnelo infusion for systemic lupus erythematosus.     Regimen:  Saphnelo 300mg  IV every 4 weeks .    Discussed day of infusion logistics    Side-effects:    Discussed potential risk of infections (nasopharyngitis, URI, bronchitis, shingles), infusion-related reactions, cough, and back pain.    Advised patient to schedule infusion at least 2 weeks after first dose of Shingrix (given ~09/21/22).   In the case of signs of infections including fever, discomfort when urinating, sinus congestion, or other complaints, patient should hold the next dose of Saphnelo and call clinic to ensure adequate medical care  Counseled on signs of an infusion reaction (wheezing, chest tightness, fever, itching, cough, blue skin color, seizures, swelling of face, lips, tongue or throat, etc) and the need to notify nursing staff immediately    Provided patient contact to [employer], financial counselor and infusion center.   Patient verbalized understanding and contact information provided for any future questions/concerns.     Gibson Ramp, PharmD Candidate

## 2022-10-03 NOTE — Unmapped (Signed)
[facility] Refill Coordination Note    Specialty Medication(s) to be Shipped:   BENLYSTA 200 mg/mL Atin (belimumab)    Other medication(s) to be shipped: No additional medications requested for fill at this time     Tonya Brady, DOB: Jul 28, 1975  Phone: [phone] (home)       All above HIPAA information was verified with patient.     Was a Nurse, learning disability used for this call? No    Completed refill call assessment today to schedule patient's medication shipment from the [facility] [phone]).  All relevant notes have been reviewed.     Specialty medication(s) and dose(s) confirmed: Regimen is correct and unchanged.   Changes to medications: [name] reports no changes at this time.  Changes to insurance: No  New side effects reported not previously addressed with a pharmacist or physician: None reported  Questions for the pharmacist: No    Confirmed patient received a Conservation officer, historic buildings and a Surveyor, mining with first shipment. The patient will receive a drug information handout for each medication shipped and additional FDA Medication Guides as required.       DISEASE/MEDICATION-SPECIFIC INFORMATION        For patients on injectable medications: Patient currently has 2 doses left.  Next injection is scheduled for 10/05/22.    SPECIALTY MEDICATION ADHERENCE     Medication Adherence    Patient reported X missed doses in the last month: 0  Specialty Medication: BENLYSTA 200 mg/mL Atin (belimumab)  Patient is on additional specialty medications: No  Patient is on more than two specialty medications: No  Any gaps in refill history greater than 2 weeks in the last 3 months: no  Demonstrates understanding of importance of adherence: yes              Were doses missed due to medication being on hold? No     BENLYSTA 200 mg/mL Atin (belimumab): 14 days of medicine on hand       REFERRAL TO PHARMACIST     Referral to the pharmacist: Not needed      [facility]     Shipping address confirmed in Epic.       Delivery Scheduled: Yes, Expected medication delivery date: 10/11/22.     Medication will be delivered via Same Day Courier to the prescription address in Epic WAM.    Tonya Brady Tonya Brady   [facility] Specialty Technician

## 2022-10-03 NOTE — Unmapped (Signed)
Pt followed by hepatology closely. Reviewed relevant medications and labs. Pt is following our Weight Management Clinic. See Obesity tab for medication changes.

## 2022-10-10 DIAGNOSIS — K7460 Unspecified cirrhosis of liver: Principal | ICD-10-CM

## 2022-10-10 DIAGNOSIS — K7581 Nonalcoholic steatohepatitis (NASH): Principal | ICD-10-CM

## 2022-10-11 MED FILL — BENLYSTA 200 MG/ML SUBCUTANEOUS AUTO-INJECTOR: SUBCUTANEOUS | 28 days supply | Qty: 4 | Fill #2

## 2022-10-20 ENCOUNTER — Ambulatory Visit: Admit: 2022-10-20 | Discharge: 2022-10-21 | Payer: PRIVATE HEALTH INSURANCE

## 2022-10-20 MED ADMIN — anifrolumab-fnia (SAPHNELO) 300 mg in sodium chloride (NS) 0.9 % 100 mL IVPB: 300 mg | INTRAVENOUS | @ 15:00:00 | Stop: 2022-10-20

## 2022-10-20 MED ADMIN — ondansetron (ZOFRAN) tablet 4 mg: 4 mg | ORAL | @ 16:00:00 | Stop: 2022-10-20

## 2022-10-20 MED ADMIN — diphenhydrAMINE (BENADRYL) capsule/tablet 25 mg: 25 mg | ORAL | @ 14:00:00 | Stop: 2022-10-20

## 2022-10-20 NOTE — Unmapped (Signed)
Patient in today for Anifrolumab- Afnia  infusion. Patient has no s/s of infection.   1040 Anifrolumab- Afnia started ,patient instructed to use call bell /call nurse for any s/s of unsual symptoms during infusion. Patient educated on possible s/s of reaction,such as chestpain ,itching,shortness of breath lightheadedness and any kind of discomfort. Patient verbalized understanding.     1109 Infusion completed and well tolerated.IV flushed with 10ml NS post infusion.   1134 Pt complained of a little nausea starting, PRN zofran given  1209 nausea gone, pt discharged

## 2022-10-23 ENCOUNTER — Ambulatory Visit: Admit: 2022-10-23 | Discharge: 2022-10-23 | Payer: PRIVATE HEALTH INSURANCE

## 2022-10-23 ENCOUNTER — Ambulatory Visit
Admit: 2022-10-23 | Discharge: 2022-10-23 | Payer: PRIVATE HEALTH INSURANCE | Attending: Internal Medicine | Primary: Internal Medicine

## 2022-10-23 NOTE — Unmapped (Signed)
SPORTS MEDICINE CONSULTATION VISIT    ASSESSMENT AND PLAN      Diagnosis ICD-10-CM Associated Orders   1. Tendinopathy of right rotator cuff  M67.911 XR Shoulder 3 Or More Views Right     Ambulatory referral to Orthopedic Surgery     MRI Upper Extremity Joint Right Wo Contrast           Tendinopathy of Right rotator cuff  47 yo PMHX SLE and RA presenting with 4 months of worsening right shoulder pain w/o specific inciting event. Failed subacromial CSI and 6 weeks of 3x weekly PT. 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 On exam there is no notable atrophy, TTP supraspinatus insertion. Internal rotation limited on right compared to left. Special testing significant for positive Neer's, Hawkins, Empty Can, Subscap lift off. X-rays obtained today notable for mild osteoarthritis of right AC joint. Given positive special tests, and tenderness on exam.       Return for F/U MRI.    Procedure(s):  N/a      SUBJECTIVE     Chief Complaint:   Chief Complaint   Patient presents with    Right Shoulder - Pain     Notes was having issues with Right shoulder - Did have steroid injection around February- which did ok. She did Physical therapy - which did help but she is still pain with doing more activity in the shoulder. Patient cleans houses for a living.           History of Present Illness: 47 y.o. female who presents for right shoulder pain. Pain began in February, describes it as throbbing, dull,can be stabbing down lateral arm, soreness. Lifting heavy objects, scrubbing make it worse. No history of injuries to right shoulder, no dislocations. Has had some neck pain and numbness down arms bilterally.     3 days of rest improved, taking 400-800mg  ibuprofen taking PRN    Has RA as well    Works cleaning houses    Did PT  in [place] and had CSI done, reports CSI was done posteriorly  PT 6 weeks 3x weekly did not provide relief  Chiropractor has provided temporary relief        Past Medical History: Past Medical History:   Diagnosis Date    Acute carpal tunnel syndrome of left wrist 11/08/2017    Anemia     Anxiety     Arthritis     Bulging lumbar disc 2005    Cirrhosis of liver (CMS-HCC)     COVID-19 06/18/2019    Depression     Diabetes mellitus (CMS-HCC)     Difficult intravenous access     Fibromyalgia 03/09/2013    Heart murmur     NASH (nonalcoholic steatohepatitis)     Obesity     Pyelonephritis 03/07/2021    Restless leg syndrome 06/30/2013    Secondary Sjogren's syndrome (CMS-HCC) 03/09/2013    Secondary to SLE  Diagnosed: 12/2003 (by sx's, parotid gland enlargement, and serologies) Pathology: biopsy not done Labs: + ANA, anti-SSA, anti-SSB, RF     Systemic lupus erythematosus (CMS-HCC) 01/19/2011    Valvular regurgitation          OBJECTIVE     Physical Exam:  Vitals:   Wt Readings from Last 3 Encounters:   10/20/22 76.4 kg (168 lb 8 oz)   09/28/22 76.7 kg (169 lb 3.2 oz)   09/21/22 76.8 kg (169 lb 6.4 oz)  Estimated body mass index is 32.91 kg/m?? as calculated from the following:    Height as of 09/28/22: 152.4 cm (5').    Weight as of 10/20/22: 76.4 kg (168 lb 8 oz).  Gen: Well-appearing female in no acute distress  MSK:     Comprehensive Right Shoulder Exam:  Inspection:   Ecchymosis: negative   Scapular winging: negative   Posture: normal  TTP:    AC joint, deltoid muscle  ROM:    F flexion: Normal   Abduction: Normal   ER: Normal   IR: T12  Strength:   SSP: 5/5   ER: 5/5  IR: 5/5  Special Tests:   Painful Arc: negative   Hawkin's: positive   Neer's: positive  Empty can: positive  Subscap lift off: positive  External Lag: negative  Internal Lag: positive  Drop arm: negative  O'brien's: negative  AC crossover: negative  Resisted AC cross adduction: negative  Apprehension: not done  Jobe Relocation: not done  Posterior Apprehension: not done  Load and Shift: Anterior not done, Posterior not done      Imaging/other tests:     X-rays right shoulder 10/23/22: minimal osteoarthritis of AC joint. Orthopaedic PROMIS:  Failed to redirect to the Timeline version of the REVFS SmartLink.        10/23/2022   [facility]   Upper Extremity Physical Function CAT Score 20   Pain Interference CAT Score 20       PRO Status:  Patient completed PRO.      ADMINISTRATIVE     I have personally reviewed and interpreted the images (as available).  Point-of-care ultrasound imaging is on file and stored in a permanent location (if performed).  I have personally reviewed prior records and incorporated relevant information above (as available).    MEDICAL DECISION MAKING (level of service defined by 2/3 elements)     Number/Complexity of Problems Addressed 1 or more chronic illnesses with exacerbation, progression, or side effects of treatment (99204/99214)   Amount/Complexity of Data to be Reviewed/Analyzed Independent interpretation of a test performed by another physician/other qualified health care professional (99204/99214)   Risk of Complications/Morbidity/Mortality of Management --LOW Risk of Morbidity from Additional Diagnostic Testing or Treatment (99203/99213)--     TIME     Total Time for E/M Services on the Date of Encounter Time-based coding not utilized for this encounter     CONSULTATION     Consultation services provided YES - Consultation was performed at the request of Tonya Brady , to whom my opinion and all services ordered and performed were communicated via written report.     MODIFIER 25 (Significant, Separately Billable Evaluation and Management)     Documentation to ensure appropriate insurance payment for medically necessary work    Per the [employer] (rev. 05/22/2021) Chapter 13, Section B. Evaluation & Management (E&M) Services, paragraph 5:   ???In general, E&M services on the same date of service as the minor surgical procedure are included in the payment for the procedure???However, a significant and separately identifiable E&M service unrelated to the decision to perform the minor surgical procedure is separately reportable with modifier 25. The E&M service and minor surgical procedure do not require different diagnoses.???    Per the American Medical Association in Reporting CPT Modifier 25 [CPT??Geophysicist/field seismologist (Online). 2023;33(11):1-12.] Page 1, Appropriate Use, paragraph 1:   ???Modifier 25 is used to indicate that a patient's condition required a significant, separately identifiable  evaluation and management (E/M) service above and beyond that associated with another procedure or service being reported by the same physician or other qualified health care professional [facility]) on the same date. This service must be above and beyond the other service provided or beyond the usual preoperative and postoperative care associated with the procedure or service that was performed on that same date, and it must be substantiated by documentation in the patient's record that satisfies the relevant criteria for the respective E/M service to be reported.???    Per the American Medical Association in Reporting CPT Modifier 25 [CPT??Geophysicist/field seismologist (Online). 2023;33(11):1-12.] Page 2, Considerations, bullet point 2 (Requires awareness of usual preoperative and postoperative services):   ???Pre- and post-operative services typically associated with a procedure include the following and cannot be reported with a separate E/M services code:   Review of patient's relevant past medical history,  Assessment of the problem area to be treated by surgical or other service,  Formulation and explanation of the clinical diagnosis,  Review and explanation of the procedure to the patient, family, or caregiver,  Discussion of alternative treatments or diagnostic options,  Obtaining informed consent,  Providing postoperative care instructions,  Discussion of any further treatment and follow up after the procedure???    As the service provider for this encounter, I attest that the patient's condition required a significant, separately identifiable, medical necessary evaluation and management service in addition to the procedure performed on the same date of service. The evaluation and management service was above and beyond the usual preoperative and postoperative care associated with the procedure. The specific elements of the encounter that represent evaluation and management service above and beyond the usual preoperative and postoperative care associated with the procedure include, but are not limited to:  Reviewed the patient's relevant past surgical history, social history and family history  Reviewed the patient's medications  Reviewed the patient's allergies  Independently obtained history of present illness and relevant review of systems  Independently reviewed laboratory, imaging and/or other data  Independently synthesized history, physical examination, laboratory, imaging and/or other data to formulate a management plan including elements separate from the procedure itself and usual postprocedural care       PROCEDURES     Procedures     DME     DME ORDER:  Dx:  ,

## 2022-10-23 NOTE — Unmapped (Signed)
Plan:  MRI Scheduling and follow up instructions:    I have ordered an MRI test for you. The [facility] Radiology Department should call you directly in 2-3 business days to get this test scheduled. If you do not hear from the Radiology Department by this time, please feel free to call the scheduling line at [phone] option#1.  I will typically schedule an in person or phone visit follow up with you to review the MRI.       Thank you for coming to [facility] and our clinic today!     We aim to provide you with the highest quality, individualized care.  If you have any unanswered questions after the visit, please do not hesitate to reach out to [place] on MyChart or leave a message for the nurse.  ?  MyChart messages: These messages can be sent to your provider and will be checked by their clinical support staff.? The messages are checked throughout the day during normal business hours from 8:30 am-4:00 pm Monday-Friday, however responses may take up to 48 hours.? Please use this method of communication for non-urgent and non-emergent concerns, questions, refill requests or inquiries only.? ?Our team will help respond to all of your questions.? Please note that you may be asked to see a provider by either a telehealth or in person visit if it is deemed your questions are best handled in the clinic setting in person.??  ?  Please keep in mind, these messages are not real time communications, so be patient when waiting for a response.    If you do not have access to MyChart, do not know how to use MyChart or have an issue that may require more extensive discussion, please call the nurses' call line: [phone].? This line is checked throughout the day and will be responded to as time allows.? Please note that return calls could take up to 48 hours, depending on the nature of the need.?  ?  If you have an issue that requires emergent attention that cannot wait; either call the Orthopaedics resident on call at [phone], consider coming to our [facility] walk-in clinic, or go to the nearest Emergency Department.    If you need to schedule future appointments, please call [phone].     We look forward to seeing you again in the future and appreciate you choosing [place] for your care!    Thank you,                We provide innovative and comprehensive patient centered care that is supported by evidence-based research                                                                                                    RESEARCH PARTICIPATION    Please check out our current research studies to see if you or someone you know may qualify at:    [email or web address]

## 2022-11-09 DIAGNOSIS — M329 Systemic lupus erythematosus, unspecified: Principal | ICD-10-CM

## 2022-11-09 MED ORDER — BENLYSTA 200 MG/ML SUBCUTANEOUS AUTO-INJECTOR
SUBCUTANEOUS | 2 refills | 28 days
Start: 2022-11-09 — End: ?

## 2022-11-09 NOTE — Unmapped (Signed)
Benlysta refill  Last Visit Date: 09/20/2022  Next Visit Date: 01/25/2023    Lab Results   Component Value Date    ALT 12 09/20/2022    AST 15 09/20/2022    ALBUMIN 3.9 09/20/2022    CREATININE 0.71 09/20/2022     Lab Results   Component Value Date    WBC 3.6 09/20/2022    HGB 13.3 09/20/2022    HCT 39.2 09/20/2022    PLT 146 (L) 09/20/2022     Lab Results   Component Value Date    NEUTROPCT 69.9 09/20/2022    LYMPHOPCT 17.6 09/20/2022    MONOPCT 11.7 09/20/2022    EOSPCT 0.6 09/20/2022    BASOPCT 0.2 09/20/2022

## 2022-11-09 NOTE — Unmapped (Signed)
[facility] Clinical Assessment & Refill Coordination Note    *Patient has had 1 Saphnelo IV infusion and felt as if she had the flu afterwards and no benefits seen yet.  She is unsure how long to overlap Benlysta and Saphnelo.  Benlysta has no refills, requested refill to see if patient is to continue on this     Tonya Brady, DOB: [birth year]  Phone: [phone] (home)     All above HIPAA information was verified with patient.     Was a Nurse, learning disability used for this call? No    Specialty Medication(s):   Inflammatory Disorders: Benlysta     Current Outpatient Medications   Medication Sig Dispense Refill    aluminum-magnesium hydroxide-simethicone (MAALOX PLUS) 200-200-20 mg/5 mL Susp       belimumab (BENLYSTA) 200 mg/mL AtIn Inject the contents of 1 auto-injector (200 mg total) under the skin every seven (7) days. 4 mL 2    buPROPion (WELLBUTRIN XL) 300 MG 24 hr tablet Take 1 tablet (300 mg total) by mouth every morning. 60 tablet 2    clobetasoL (TEMOVATE) 0.05 % ointment Use clobetasol ointment twice daily from the neck down, stop after 2 weeks 60 g 1    desonide (DESOWEN) 0.05 % ointment Apply topically two (2) times a day. To active areas of the face. Use until improved or for up to 2 weeks. If not improving, increase to triamcinolone. 60 g 3    famotidine (PEPCID) 40 MG tablet TAKE 1 TABLET(40 MG) BY MOUTH EVERY NIGHT AS NEEDED FOR HEARTBURN 90 tablet 3    fluticasone propionate (FLONASE) 50 mcg/actuation nasal spray 1 spray into each nostril daily. 16 g 11    hydrOXYchloroQUINE (PLAQUENIL) 200 mg tablet Take 1 tablet (200 mg total) by mouth Two (2) times a day. 180 tablet 3    ibuprofen (MOTRIN) 800 MG tablet ibuprofen 800 mg tablet      naltrexone (DEPADE) 50 mg tablet Take 0.5 tablets (25 mg total) by mouth two (2) times a day. 60 tablet 2     No current facility-administered medications for this visit.        Changes to medications: [name] reports no changes at this time.    Allergies   Allergen Reactions    Aspirin Swelling     Other reaction(s): SWELLING/EDEMA    Fish Containing Products Diarrhea and Nausea And Vomiting    Iodine Other (See Comments)     Pt told not to have d/t shellfish allergy    Shellfish Containing Products Hives, Diarrhea and Nausea Only     Other reaction(s): HIVES  Other reaction(s): HIVES  Other reaction(s): NAUSEA    Venom-Honey Bee        Changes to allergies: No    SPECIALTY MEDICATION ADHERENCE            Specialty medication(s) dose(s) confirmed: Regimen is correct and unchanged.     Are there any concerns with adherence? No    Adherence counseling provided? Not needed    CLINICAL MANAGEMENT AND INTERVENTION      Clinical Benefit Assessment:    Do you feel the medicine is effective or helping your condition?  Some    Clinical Benefit counseling provided? Progress note from 09/20/22 shows evidence of clinical benefit    Adverse Effects Assessment:    Are you experiencing any side effects? No    Are you experiencing difficulty administering your medicine? No    Quality of Life  Assessment:    Quality of Life    Rheumatology  Oncology  Dermatology  Cystic Fibrosis          How many days over the past month did your conditon  keep you from your normal activities? For example, brushing your teeth or getting up in the morning. Patient declined to answer    Have you discussed this with your provider? Not needed    Acute Infection Status:    Acute infections noted within Epic:  No active infections  Patient reported infection: None    Therapy Appropriateness:    Is therapy appropriate and patient progressing towards therapeutic goals? Pharmacist will consult provider    DISEASE/MEDICATION-SPECIFIC INFORMATION      For patients on injectable medications: Patient currently has 1 doses left.  Next injection is scheduled for 6/20.    Chronic Inflammatory Diseases: Have you experienced any flares in the last month? Yes, spoke to provider  Has this been reported to your provider? Yes, switch to saphnelo IV infusion    PATIENT SPECIFIC NEEDS     Does the patient have any physical, cognitive, or cultural barriers? No    Is the patient high risk? No    Did the patient require a clinical intervention? No    Does the patient require physician intervention or other additional services (i.e., nutrition, smoking cessation, social work)? No    SOCIAL DETERMINANTS OF HEALTH     At the [facility], we have learned that life circumstances - like trouble affording food, housing, utilities, or transportation can affect the health of many of our patients.   That is why we wanted to ask: are you currently experiencing any life circumstances that are negatively impacting your health and/or quality of life? No    Social Determinants of Health     Financial Resource Strain: Low Risk  (10/15/2019)    Overall Financial Resource Strain (CARDIA)     Difficulty of Paying Living Expenses: Not hard at all   Internet Connectivity: Not on file   Food Insecurity: No Food Insecurity (10/15/2019)    Hunger Vital Sign     Worried About Running Out of Food in the Last Year: Never true     Ran Out of Food in the Last Year: Never true   Tobacco Use: Low Risk  (10/23/2022)    Patient History     Smoking Tobacco Use: Never     Smokeless Tobacco Use: Never     Passive Exposure: Not on file   Housing/Utilities: Low Risk  (10/15/2019)    Housing/Utilities     Within the past 12 months, have you ever stayed: outside, in a car, in a tent, in an overnight shelter, or temporarily in someone else's home (i.e. couch-surfing)?: No     Are you worried about losing your housing?: No     Within the past 12 months, have you been unable to get utilities (heat, electricity) when it was really needed?: No   Alcohol Use: Not At Risk (10/15/2019)    Alcohol Use     How often do you have a drink containing alcohol?: Never     How many drinks containing alcohol do you have on a typical day when you are drinking?: 1 - 2     How often do you have 5 or more drinks on one occasion?: Never   Transportation Needs: No Transportation Needs (11/14/2019)    PRAPARE - Transportation     Lack of Transportation (  Medical): No     Lack of Transportation (Non-Medical): No   Substance Use: Low Risk  (10/15/2019)    Substance Use     Taken prescription drugs for non-medical reasons: Never     Taken illegal drugs: Never     Patient indicated they have taken drugs in the past year for non-medical reasons: Yes, [positive answer(s)]: Not on file   Health Literacy: Low Risk  (04/19/2021)    Health Literacy     : Never   Physical Activity: Insufficiently Active (11/14/2019)    Exercise Vital Sign     Days of Exercise per Week: 3 days     Minutes of Exercise per Session: 30 min   Interpersonal Safety: Not on file   Stress: Stress Concern Present (11/14/2019)    [employer] of Occupational Health - Occupational Stress Questionnaire     Feeling of Stress : To some extent   Intimate Partner Violence: Not At Risk (11/14/2019)    Humiliation, Afraid, Rape, and Kick questionnaire     Fear of Current or Ex-Partner: No     Emotionally Abused: No     Physically Abused: No     Sexually Abused: No   Depression: Not at risk (12/29/2021)    PHQ-2     PHQ-2 Score: 0   Social Connections: Unknown (11/14/2019)    Social Connection and Isolation Panel [NHANES]     Frequency of Communication with Friends and Family: Three times a week     Frequency of Social Gatherings with Friends and Family: Three times a week     Attends Religious Services: Never     Active Member of Clubs or Organizations: No     Attends Banker Meetings: Never     Marital Status: Not on file       Would you be willing to receive help with any of the needs that you have identified today? Not applicable       SHIPPING     Specialty Medication(s) to be Shipped:   Inflammatory Disorders: Benlysta    Other medication(s) to be shipped: No additional medications requested for fill at this time Changes to insurance: No    Delivery Scheduled: Yes, Expected medication delivery date: 6/21 or 6/24.     Medication will be delivered via Same Day Courier to the confirmed prescription address in [facility].    The patient will receive a drug information handout for each medication shipped and additional FDA Medication Guides as required.  Verified that patient has previously received a Conservation officer, historic buildings and a Surveyor, mining.    The patient or caregiver noted above participated in the development of this care plan and knows that they can request review of or adjustments to the care plan at any time.      All of the patient's questions and concerns have been addressed.    Julianne Rice, PharmD   [facility] Specialty Pharmacist

## 2022-11-17 ENCOUNTER — Ambulatory Visit: Admit: 2022-11-17 | Discharge: 2022-11-18 | Payer: PRIVATE HEALTH INSURANCE

## 2022-11-17 MED ADMIN — ondansetron (ZOFRAN-ODT) disintegrating tablet 4 mg: 4 mg | ORAL | @ 15:00:00 | Stop: 2022-11-17

## 2022-11-17 MED ADMIN — diphenhydrAMINE (BENADRYL) capsule/tablet 25 mg: 25 mg | ORAL | @ 15:00:00 | Stop: 2022-11-17

## 2022-11-17 MED ADMIN — anifrolumab-fnia (SAPHNELO) 300 mg in sodium chloride (NS) 0.9 % 100 mL IVPB: 300 mg | INTRAVENOUS | @ 15:00:00 | Stop: 2022-11-17

## 2022-11-17 NOTE — Unmapped (Signed)
Pt presents for Saphnelo (anifrolumab-fnia) infusion for SLE.  Pt denies any recent infection or fever, VSS.  IV initiated LAC , labs drawn, premeds administered per provider order.  Pt aware of potential reaction/side effects, call bell within reach and patient aware of how to use.    Patient stated she had nausea towards the end of her last infusion. Zofran given as a prophylactic.    1040 Saphnelo (anifrolumab-fnia) 300 mg started, to infuse over 30 minutes.    1110 Saphnelo (anifrolumab-fnia) infusion complete.  Pt tolerated without complication, VSS.  IV flushed per policy and d/c'd, gauze and coban applied.  Pt discharged from [facility] in no acute distress, next infusion appointment made.

## 2022-11-24 ENCOUNTER — Ambulatory Visit: Admit: 2022-11-24 | Discharge: 2022-11-25 | Payer: PRIVATE HEALTH INSURANCE

## 2022-11-30 NOTE — Unmapped (Signed)
Specialty Medication(s): Benlysta    Tonya Brady has been dis-enrolled from the [facility] specialty pharmacy services due to a change in therapy. The patient is now taking Saphnelo  and is not filling at the [facility].    Additional information provided to the patient: n/a    [name], PharmD  [facility] Specialty Pharmacist

## 2022-12-05 ENCOUNTER — Ambulatory Visit
Admit: 2022-12-05 | Discharge: 2022-12-06 | Payer: PRIVATE HEALTH INSURANCE | Attending: Student in an Organized Health Care Education/Training Program | Primary: Student in an Organized Health Care Education/Training Program

## 2022-12-05 DIAGNOSIS — M67911 Unspecified disorder of synovium and tendon, right shoulder: Principal | ICD-10-CM

## 2022-12-05 DIAGNOSIS — M7551 Bursitis of right shoulder: Principal | ICD-10-CM

## 2022-12-05 DIAGNOSIS — M7521 Bicipital tendinitis, right shoulder: Principal | ICD-10-CM

## 2022-12-05 DIAGNOSIS — M19011 Primary osteoarthritis, right shoulder: Principal | ICD-10-CM

## 2022-12-08 DIAGNOSIS — M67911 Unspecified disorder of synovium and tendon, right shoulder: Principal | ICD-10-CM

## 2022-12-08 DIAGNOSIS — M7521 Bicipital tendinitis, right shoulder: Principal | ICD-10-CM

## 2022-12-09 DIAGNOSIS — M329 Systemic lupus erythematosus, unspecified: Principal | ICD-10-CM

## 2022-12-20 ENCOUNTER — Institutional Professional Consult (permissible substitution)
Admit: 2022-12-20 | Discharge: 2022-12-20 | Payer: PRIVATE HEALTH INSURANCE | Attending: Family Medicine | Primary: Family Medicine

## 2022-12-20 ENCOUNTER — Ambulatory Visit: Admit: 2022-12-20 | Discharge: 2022-12-20 | Payer: PRIVATE HEALTH INSURANCE

## 2022-12-20 DIAGNOSIS — M7521 Bicipital tendinitis, right shoulder: Principal | ICD-10-CM

## 2022-12-20 DIAGNOSIS — M67911 Unspecified disorder of synovium and tendon, right shoulder: Principal | ICD-10-CM

## 2022-12-20 MED ORDER — CIPROFLOXACIN 0.3 %-DEXAMETHASONE 0.1 % EAR DROPS,SUSPENSION
Freq: Two times a day (BID) | OTIC | 0 refills | 19 days | Status: CP
Start: 2022-12-20 — End: 2022-12-27

## 2022-12-22 ENCOUNTER — Ambulatory Visit: Admit: 2022-12-22 | Discharge: 2022-12-23 | Payer: PRIVATE HEALTH INSURANCE

## 2022-12-22 DIAGNOSIS — M329 Systemic lupus erythematosus, unspecified: Principal | ICD-10-CM

## 2023-01-02 ENCOUNTER — Telehealth
Admit: 2023-01-02 | Discharge: 2023-01-03 | Payer: PRIVATE HEALTH INSURANCE | Attending: Family Medicine | Primary: Family Medicine

## 2023-01-02 DIAGNOSIS — K7460 Unspecified cirrhosis of liver: Principal | ICD-10-CM

## 2023-01-02 DIAGNOSIS — E669 Obesity, unspecified: Principal | ICD-10-CM

## 2023-01-02 DIAGNOSIS — K7581 Nonalcoholic steatohepatitis (NASH): Principal | ICD-10-CM

## 2023-01-02 MED ORDER — SEMAGLUTIDE 0.25 MG OR 0.5 MG (2 MG/1.5 ML) SUBCUTANEOUS PEN INJECTOR
SUBCUTANEOUS | 1 refills | 147 days | Status: CP
Start: 2023-01-02 — End: ?

## 2023-01-04 ENCOUNTER — Encounter
Admit: 2023-01-04 | Discharge: 2023-01-04 | Payer: PRIVATE HEALTH INSURANCE | Attending: Certified Registered" | Primary: Certified Registered"

## 2023-01-04 ENCOUNTER — Ambulatory Visit: Admit: 2023-01-04 | Discharge: 2023-01-05 | Payer: PRIVATE HEALTH INSURANCE

## 2023-01-04 ENCOUNTER — Ambulatory Visit: Admit: 2023-01-04 | Discharge: 2023-01-04 | Payer: PRIVATE HEALTH INSURANCE

## 2023-01-04 MED ORDER — OXYCODONE 5 MG TABLET
ORAL_TABLET | Freq: Four times a day (QID) | ORAL | 0 refills | 5 days | Status: CP | PRN
Start: 2023-01-04 — End: 2023-01-09

## 2023-01-04 MED ORDER — ACETAMINOPHEN 500 MG TABLET
ORAL_TABLET | Freq: Three times a day (TID) | ORAL | 0 refills | 7 days | Status: CP
Start: 2023-01-04 — End: 2023-01-11

## 2023-01-04 MED ORDER — GABAPENTIN 100 MG CAPSULE
ORAL_CAPSULE | Freq: Three times a day (TID) | ORAL | 0 refills | 5 days | Status: CP
Start: 2023-01-04 — End: 2023-01-09

## 2023-01-04 MED ORDER — ONDANSETRON 4 MG DISINTEGRATING TABLET
ORAL_TABLET | Freq: Three times a day (TID) | ORAL | 0 refills | 5 days | Status: CP | PRN
Start: 2023-01-04 — End: 2023-01-18

## 2023-01-04 MED ORDER — DOCUSATE SODIUM 100 MG CAPSULE
ORAL_CAPSULE | Freq: Two times a day (BID) | ORAL | 0 refills | 7 days | Status: CP
Start: 2023-01-04 — End: 2023-01-11

## 2023-01-07 ENCOUNTER — Ambulatory Visit
Admit: 2023-01-07 | Discharge: 2023-01-08 | Payer: PRIVATE HEALTH INSURANCE | Attending: Student in an Organized Health Care Education/Training Program | Primary: Student in an Organized Health Care Education/Training Program

## 2023-01-07 DIAGNOSIS — H6691 Otitis media, unspecified, right ear: Principal | ICD-10-CM

## 2023-01-07 MED ORDER — AMOXICILLIN 875 MG-POTASSIUM CLAVULANATE 125 MG TABLET
ORAL_TABLET | Freq: Two times a day (BID) | ORAL | 0 refills | 7 days | Status: CP
Start: 2023-01-07 — End: 2023-01-14

## 2023-01-19 ENCOUNTER — Ambulatory Visit
Admit: 2023-01-19 | Discharge: 2023-01-20 | Payer: PRIVATE HEALTH INSURANCE | Attending: Student in an Organized Health Care Education/Training Program | Primary: Student in an Organized Health Care Education/Training Program

## 2023-01-23 ENCOUNTER — Ambulatory Visit: Admit: 2023-01-23 | Discharge: 2023-01-24 | Payer: PRIVATE HEALTH INSURANCE

## 2023-01-23 DIAGNOSIS — M329 Systemic lupus erythematosus, unspecified: Principal | ICD-10-CM

## 2023-01-25 ENCOUNTER — Ambulatory Visit
Admit: 2023-01-25 | Discharge: 2023-01-26 | Payer: PRIVATE HEALTH INSURANCE | Attending: Student in an Organized Health Care Education/Training Program | Primary: Student in an Organized Health Care Education/Training Program

## 2023-01-25 DIAGNOSIS — M329 Systemic lupus erythematosus, unspecified: Principal | ICD-10-CM

## 2023-01-30 ENCOUNTER — Ambulatory Visit
Admit: 2023-01-30 | Discharge: 2023-01-31 | Payer: PRIVATE HEALTH INSURANCE | Attending: Family Medicine | Primary: Family Medicine

## 2023-01-30 DIAGNOSIS — R21 Rash and other nonspecific skin eruption: Principal | ICD-10-CM

## 2023-01-30 DIAGNOSIS — H6690 Otitis media, unspecified, unspecified ear: Principal | ICD-10-CM

## 2023-01-30 DIAGNOSIS — H9201 Otalgia, right ear: Principal | ICD-10-CM

## 2023-01-30 DIAGNOSIS — Z23 Encounter for immunization: Principal | ICD-10-CM

## 2023-02-05 MED ORDER — SEMAGLUTIDE 0.25 MG OR 0.5 MG (2 MG/1.5 ML) SUBCUTANEOUS PEN INJECTOR
SUBCUTANEOUS | 1 refills | 147 days
Start: 2023-02-05 — End: ?

## 2023-02-06 MED ORDER — SEMAGLUTIDE 0.25 MG OR 0.5 MG (2 MG/1.5 ML) SUBCUTANEOUS PEN INJECTOR
SUBCUTANEOUS | 0 refills | 35 days | Status: CP
Start: 2023-02-06 — End: ?

## 2023-02-16 ENCOUNTER — Ambulatory Visit: Admit: 2023-02-16 | Discharge: 2023-02-17 | Payer: PRIVATE HEALTH INSURANCE

## 2023-02-20 DIAGNOSIS — M329 Systemic lupus erythematosus, unspecified: Principal | ICD-10-CM

## 2023-03-02 ENCOUNTER — Ambulatory Visit
Admit: 2023-03-02 | Discharge: 2023-03-03 | Payer: PRIVATE HEALTH INSURANCE | Attending: Student in an Organized Health Care Education/Training Program | Primary: Student in an Organized Health Care Education/Training Program

## 2023-03-02 DIAGNOSIS — M7551 Bursitis of right shoulder: Principal | ICD-10-CM

## 2023-03-02 MED ORDER — GABAPENTIN 100 MG CAPSULE
ORAL_CAPSULE | Freq: Three times a day (TID) | ORAL | 0 refills | 10 days | Status: CP | PRN
Start: 2023-03-02 — End: ?

## 2023-03-13 ENCOUNTER — Ambulatory Visit
Admit: 2023-03-13 | Discharge: 2023-03-14 | Payer: PRIVATE HEALTH INSURANCE | Attending: Family Medicine | Primary: Family Medicine

## 2023-03-13 DIAGNOSIS — E66811 Class 1 obesity: Principal | ICD-10-CM

## 2023-03-13 DIAGNOSIS — Z0000 Encounter for general adult medical examination without abnormal findings: Principal | ICD-10-CM

## 2023-03-13 DIAGNOSIS — Z2821 Immunization not carried out because of patient refusal: Principal | ICD-10-CM

## 2023-03-14 MED ORDER — SEMAGLUTIDE 0.25 MG OR 0.5 MG (2 MG/1.5 ML) SUBCUTANEOUS PEN INJECTOR
SUBCUTANEOUS | 0 refills | 35 days
Start: 2023-03-14 — End: ?

## 2023-03-15 MED ORDER — SEMAGLUTIDE 0.25 MG OR 0.5 MG (2 MG/1.5 ML) SUBCUTANEOUS PEN INJECTOR
SUBCUTANEOUS | 0 refills | 35 days | Status: CP
Start: 2023-03-15 — End: ?

## 2023-03-16 ENCOUNTER — Ambulatory Visit: Admit: 2023-03-16 | Discharge: 2023-03-17 | Payer: PRIVATE HEALTH INSURANCE

## 2023-03-16 DIAGNOSIS — M329 Systemic lupus erythematosus, unspecified: Principal | ICD-10-CM

## 2023-03-16 DIAGNOSIS — Z0000 Encounter for general adult medical examination without abnormal findings: Principal | ICD-10-CM

## 2023-03-19 ENCOUNTER — Ambulatory Visit: Admit: 2023-03-19 | Discharge: 2023-03-19 | Payer: PRIVATE HEALTH INSURANCE

## 2023-03-20 ENCOUNTER — Ambulatory Visit
Admit: 2023-03-20 | Discharge: 2023-03-21 | Payer: PRIVATE HEALTH INSURANCE | Attending: Family Medicine | Primary: Family Medicine

## 2023-03-20 DIAGNOSIS — E66811 Class 1 obesity: Principal | ICD-10-CM

## 2023-03-20 DIAGNOSIS — K7460 Unspecified cirrhosis of liver: Principal | ICD-10-CM

## 2023-03-20 DIAGNOSIS — K7581 Nonalcoholic steatohepatitis (NASH): Principal | ICD-10-CM

## 2023-03-20 MED ORDER — SEMAGLUTIDE 1 MG/DOSE (2 MG/1.5 ML) SUBCUTANEOUS PEN INJECTOR
SUBCUTANEOUS | 3 refills | 14 days | Status: CP
Start: 2023-03-20 — End: 2023-06-18

## 2023-03-23 ENCOUNTER — Ambulatory Visit: Admit: 2023-03-23 | Discharge: 2023-03-24 | Payer: BLUE CROSS/BLUE SHIELD

## 2023-03-23 DIAGNOSIS — E66811 Class 1 obesity: Principal | ICD-10-CM

## 2023-03-29 ENCOUNTER — Ambulatory Visit: Admit: 2023-03-29 | Payer: BLUE CROSS/BLUE SHIELD

## 2023-03-31 DIAGNOSIS — M329 Systemic lupus erythematosus, unspecified: Principal | ICD-10-CM

## 2023-04-09 ENCOUNTER — Ambulatory Visit: Admit: 2023-04-09 | Discharge: 2023-04-10 | Payer: BLUE CROSS/BLUE SHIELD

## 2023-04-09 DIAGNOSIS — K7460 Unspecified cirrhosis of liver: Principal | ICD-10-CM

## 2023-04-09 DIAGNOSIS — K7581 Nonalcoholic steatohepatitis (NASH): Principal | ICD-10-CM

## 2023-04-13 ENCOUNTER — Ambulatory Visit: Admit: 2023-04-13 | Discharge: 2023-04-14 | Payer: BLUE CROSS/BLUE SHIELD

## 2023-04-17 DIAGNOSIS — M7551 Bursitis of right shoulder: Principal | ICD-10-CM

## 2023-04-17 MED ORDER — GABAPENTIN 100 MG CAPSULE
ORAL_CAPSULE | 0 refills | 0 days
Start: 2023-04-17 — End: ?

## 2023-04-24 DIAGNOSIS — M3500 Sicca syndrome, unspecified: Principal | ICD-10-CM

## 2023-04-24 DIAGNOSIS — M329 Systemic lupus erythematosus, unspecified: Principal | ICD-10-CM

## 2023-04-24 MED ORDER — HYDROXYCHLOROQUINE 200 MG TABLET
ORAL_TABLET | Freq: Two times a day (BID) | ORAL | 3 refills | 90 days | Status: CP
Start: 2023-04-24 — End: ?

## 2023-04-25 ENCOUNTER — Ambulatory Visit: Admit: 2023-04-25 | Discharge: 2023-04-26 | Payer: BLUE CROSS/BLUE SHIELD

## 2023-04-25 DIAGNOSIS — J0100 Acute maxillary sinusitis, unspecified: Principal | ICD-10-CM

## 2023-04-25 DIAGNOSIS — M329 Systemic lupus erythematosus, unspecified: Principal | ICD-10-CM

## 2023-04-25 DIAGNOSIS — M3500 Sicca syndrome, unspecified: Principal | ICD-10-CM

## 2023-04-25 MED ORDER — HYDROXYCHLOROQUINE 200 MG TABLET
ORAL_TABLET | Freq: Two times a day (BID) | ORAL | 3 refills | 0 days
Start: 2023-04-25 — End: ?

## 2023-04-25 MED ORDER — DOXYCYCLINE MONOHYDRATE 100 MG CAPSULE
ORAL_CAPSULE | Freq: Two times a day (BID) | ORAL | 0 refills | 10 days | Status: CP
Start: 2023-04-25 — End: 2023-05-05

## 2023-04-26 MED ORDER — HYDROXYCHLOROQUINE 200 MG TABLET
ORAL_TABLET | Freq: Two times a day (BID) | ORAL | 3 refills | 90 days
Start: 2023-04-26 — End: ?

## 2023-04-27 DIAGNOSIS — M329 Systemic lupus erythematosus, unspecified: Principal | ICD-10-CM

## 2023-05-09 ENCOUNTER — Ambulatory Visit: Admit: 2023-05-09 | Discharge: 2023-05-10 | Payer: BLUE CROSS/BLUE SHIELD

## 2023-05-09 DIAGNOSIS — J0140 Acute pansinusitis, unspecified: Principal | ICD-10-CM

## 2023-05-09 MED ORDER — PREDNISONE 20 MG TABLET
ORAL_TABLET | 0 refills | 0.00 days | Status: CP
Start: 2023-05-09 — End: ?

## 2023-05-09 MED ORDER — AMOXICILLIN 875 MG-POTASSIUM CLAVULANATE 125 MG TABLET
ORAL_TABLET | Freq: Two times a day (BID) | ORAL | 0 refills | 10.00 days | Status: CP
Start: 2023-05-09 — End: 2023-05-19

## 2023-05-11 ENCOUNTER — Ambulatory Visit: Admit: 2023-05-11 | Discharge: 2023-05-12 | Payer: BLUE CROSS/BLUE SHIELD

## 2023-05-11 DIAGNOSIS — M329 Systemic lupus erythematosus, unspecified: Principal | ICD-10-CM

## 2023-05-26 DIAGNOSIS — M329 Systemic lupus erythematosus, unspecified: Principal | ICD-10-CM

## 2023-05-30 MED ORDER — GABAPENTIN 100 MG CAPSULE
ORAL_CAPSULE | 0 refills | 0.00 days
Start: 2023-05-30 — End: ?

## 2023-06-08 ENCOUNTER — Encounter: Admit: 2023-06-08 | Discharge: 2023-06-09 | Payer: BLUE CROSS/BLUE SHIELD

## 2023-06-23 DIAGNOSIS — M329 Systemic lupus erythematosus, unspecified: Principal | ICD-10-CM

## 2023-07-06 ENCOUNTER — Encounter: Admit: 2023-07-06 | Discharge: 2023-07-07 | Payer: BLUE CROSS/BLUE SHIELD

## 2023-07-13 ENCOUNTER — Ambulatory Visit
Admit: 2023-07-13 | Discharge: 2023-07-14 | Payer: BLUE CROSS/BLUE SHIELD | Attending: Student in an Organized Health Care Education/Training Program | Primary: Student in an Organized Health Care Education/Training Program

## 2023-07-13 DIAGNOSIS — M67911 Unspecified disorder of synovium and tendon, right shoulder: Principal | ICD-10-CM

## 2023-07-21 DIAGNOSIS — M329 Systemic lupus erythematosus, unspecified: Principal | ICD-10-CM

## 2023-07-24 ENCOUNTER — Ambulatory Visit
Admit: 2023-07-24 | Discharge: 2023-07-25 | Payer: BLUE CROSS/BLUE SHIELD | Attending: Family Medicine | Primary: Family Medicine

## 2023-07-24 DIAGNOSIS — E66811 Class 1 obesity: Principal | ICD-10-CM

## 2023-07-24 DIAGNOSIS — K7581 Nonalcoholic steatohepatitis (NASH): Principal | ICD-10-CM

## 2023-07-24 DIAGNOSIS — K7460 Unspecified cirrhosis of liver: Principal | ICD-10-CM

## 2023-07-24 DIAGNOSIS — E785 Hyperlipidemia, unspecified: Principal | ICD-10-CM

## 2023-07-24 MED ORDER — OZEMPIC 2 MG/DOSE (8 MG/3 ML) SUBCUTANEOUS PEN INJECTOR
2 refills | 0.00 days | Status: CP
Start: 2023-07-24 — End: ?

## 2023-07-24 MED ORDER — PEN NEEDLE, DIABETIC 32 GAUGE X 1/4" (6 MM)
3 refills | 0 days | Status: CP
Start: 2023-07-24 — End: ?

## 2023-07-31 ENCOUNTER — Ambulatory Visit
Admit: 2023-07-31 | Discharge: 2023-08-01 | Payer: BLUE CROSS/BLUE SHIELD | Attending: Student in an Organized Health Care Education/Training Program | Primary: Student in an Organized Health Care Education/Training Program

## 2023-07-31 DIAGNOSIS — M329 Systemic lupus erythematosus, unspecified: Principal | ICD-10-CM

## 2023-08-01 ENCOUNTER — Ambulatory Visit: Admit: 2023-08-01 | Discharge: 2023-08-02 | Payer: BLUE CROSS/BLUE SHIELD

## 2023-08-01 ENCOUNTER — Inpatient Hospital Stay: Admit: 2023-08-01 | Discharge: 2023-08-02 | Payer: BLUE CROSS/BLUE SHIELD

## 2023-08-01 DIAGNOSIS — M67911 Unspecified disorder of synovium and tendon, right shoulder: Principal | ICD-10-CM

## 2023-08-01 DIAGNOSIS — M545 Chronic midline low back pain without sciatica: Principal | ICD-10-CM

## 2023-08-01 DIAGNOSIS — G4486 Cervicogenic headache: Principal | ICD-10-CM

## 2023-08-01 DIAGNOSIS — G8929 Other chronic pain: Principal | ICD-10-CM

## 2023-08-01 DIAGNOSIS — M5412 Radiculopathy, cervical region: Principal | ICD-10-CM

## 2023-08-01 DIAGNOSIS — M542 Cervicalgia: Principal | ICD-10-CM

## 2023-08-01 MED ORDER — TIZANIDINE 4 MG TABLET
ORAL_TABLET | Freq: Every evening | ORAL | 0 refills | 30.00 days | Status: CP | PRN
Start: 2023-08-01 — End: ?

## 2023-08-03 ENCOUNTER — Encounter: Admit: 2023-08-03 | Discharge: 2023-08-04 | Payer: BLUE CROSS/BLUE SHIELD

## 2023-08-03 ENCOUNTER — Ambulatory Visit: Admit: 2023-08-03 | Discharge: 2023-08-04 | Payer: BLUE CROSS/BLUE SHIELD

## 2023-08-15 ENCOUNTER — Ambulatory Visit
Admit: 2023-08-15 | Discharge: 2023-08-16 | Payer: BLUE CROSS/BLUE SHIELD | Attending: Student in an Organized Health Care Education/Training Program | Primary: Student in an Organized Health Care Education/Training Program

## 2023-08-15 DIAGNOSIS — J069 Acute upper respiratory infection, unspecified: Principal | ICD-10-CM

## 2023-08-15 DIAGNOSIS — Z20822 Suspected COVID-19 virus infection: Principal | ICD-10-CM

## 2023-08-15 DIAGNOSIS — R070 Pain in throat: Principal | ICD-10-CM

## 2023-08-15 MED ORDER — ONDANSETRON 4 MG DISINTEGRATING TABLET
ORAL_TABLET | Freq: Three times a day (TID) | 0 refills | 3 days | Status: CP | PRN
Start: 2023-08-15 — End: 2023-08-18

## 2023-08-26 DIAGNOSIS — G4486 Cervicogenic headache: Principal | ICD-10-CM

## 2023-08-26 DIAGNOSIS — G8929 Other chronic pain: Principal | ICD-10-CM

## 2023-08-26 DIAGNOSIS — M542 Cervicalgia: Principal | ICD-10-CM

## 2023-08-26 MED ORDER — TIZANIDINE 4 MG TABLET
ORAL_TABLET | 0 refills | 0.00 days
Start: 2023-08-26 — End: ?

## 2023-08-27 MED ORDER — TIZANIDINE 4 MG TABLET
ORAL_TABLET | 0 refills | 0.00 days | Status: CP
Start: 2023-08-27 — End: ?

## 2023-08-31 ENCOUNTER — Encounter: Admit: 2023-08-31 | Discharge: 2023-09-01 | Payer: BLUE CROSS/BLUE SHIELD

## 2023-09-06 ENCOUNTER — Inpatient Hospital Stay: Admit: 2023-09-06 | Discharge: 2023-09-07 | Payer: BLUE CROSS/BLUE SHIELD

## 2023-09-13 ENCOUNTER — Ambulatory Visit: Admit: 2023-09-13 | Discharge: 2023-09-14 | Payer: BLUE CROSS/BLUE SHIELD

## 2023-09-13 DIAGNOSIS — M545 Chronic midline low back pain without sciatica: Principal | ICD-10-CM

## 2023-09-13 DIAGNOSIS — G8929 Other chronic pain: Principal | ICD-10-CM

## 2023-09-13 DIAGNOSIS — G4486 Cervicogenic headache: Principal | ICD-10-CM

## 2023-09-13 DIAGNOSIS — M542 Cervicalgia: Principal | ICD-10-CM

## 2023-09-13 DIAGNOSIS — M5412 Radiculopathy, cervical region: Principal | ICD-10-CM

## 2023-09-13 MED ORDER — MELOXICAM 15 MG TABLET
ORAL_TABLET | Freq: Every day | ORAL | 1 refills | 30.00 days | Status: CP | PRN
Start: 2023-09-13 — End: 2023-11-12

## 2023-09-13 MED ORDER — TIZANIDINE 4 MG TABLET
ORAL_TABLET | Freq: Every evening | ORAL | 0 refills | 30.00 days | Status: CP | PRN
Start: 2023-09-13 — End: ?

## 2023-09-17 DIAGNOSIS — K7581 Nonalcoholic steatohepatitis (NASH): Principal | ICD-10-CM

## 2023-09-17 DIAGNOSIS — K7460 Unspecified cirrhosis of liver: Principal | ICD-10-CM

## 2023-09-17 MED ORDER — OZEMPIC 2 MG/DOSE (8 MG/3 ML) SUBCUTANEOUS PEN INJECTOR
SUBCUTANEOUS | 0 refills | 28.00000 days | Status: CP
Start: 2023-09-17 — End: ?

## 2023-09-17 MED ORDER — NALTREXONE 50 MG TABLET
ORAL_TABLET | Freq: Two times a day (BID) | ORAL | 2 refills | 60.00000 days
Start: 2023-09-17 — End: ?

## 2023-09-18 MED ORDER — NALTREXONE 50 MG TABLET
ORAL_TABLET | Freq: Two times a day (BID) | ORAL | 3 refills | 90.00000 days | Status: CP
Start: 2023-09-18 — End: ?

## 2023-09-28 ENCOUNTER — Encounter: Admit: 2023-09-28 | Discharge: 2023-09-29 | Payer: BLUE CROSS/BLUE SHIELD

## 2023-10-08 ENCOUNTER — Ambulatory Visit: Admit: 2023-10-08 | Discharge: 2023-10-08 | Payer: BLUE CROSS/BLUE SHIELD

## 2023-10-08 ENCOUNTER — Inpatient Hospital Stay: Admit: 2023-10-08 | Discharge: 2023-10-08 | Payer: BLUE CROSS/BLUE SHIELD

## 2023-10-08 DIAGNOSIS — K7460 Unspecified cirrhosis of liver: Principal | ICD-10-CM

## 2023-10-08 DIAGNOSIS — K7581 Nonalcoholic steatohepatitis (NASH): Principal | ICD-10-CM

## 2023-10-16 MED ORDER — METFORMIN ER 500 MG TABLET,EXTENDED RELEASE 24 HR
ORAL_TABLET | Freq: Two times a day (BID) | ORAL | 3 refills | 60.00000 days | Status: CP
Start: 2023-10-16 — End: ?

## 2023-10-19 ENCOUNTER — Ambulatory Visit: Admit: 2023-10-19 | Payer: BLUE CROSS/BLUE SHIELD

## 2023-10-26 ENCOUNTER — Encounter: Admit: 2023-10-26 | Discharge: 2023-10-27 | Payer: BLUE CROSS/BLUE SHIELD

## 2023-11-01 ENCOUNTER — Ambulatory Visit: Admit: 2023-11-01 | Discharge: 2023-11-02 | Payer: BLUE CROSS/BLUE SHIELD

## 2023-11-01 DIAGNOSIS — G8929 Other chronic pain: Principal | ICD-10-CM

## 2023-11-01 DIAGNOSIS — M5416 Radiculopathy, lumbar region: Principal | ICD-10-CM

## 2023-11-01 DIAGNOSIS — M542 Cervicalgia: Principal | ICD-10-CM

## 2023-11-01 DIAGNOSIS — G4486 Cervicogenic headache: Principal | ICD-10-CM

## 2023-11-01 MED ORDER — TIZANIDINE 4 MG TABLET
ORAL_TABLET | ORAL | 0 refills | 0.00000 days | Status: CP
Start: 2023-11-01 — End: 2023-11-01

## 2023-11-08 DIAGNOSIS — M329 Systemic lupus erythematosus, unspecified: Principal | ICD-10-CM

## 2023-11-08 MED ORDER — BUPROPION HCL XL 300 MG 24 HR TABLET, EXTENDED RELEASE
ORAL_TABLET | Freq: Every day | ORAL | 1 refills | 90.00000 days
Start: 2023-11-08 — End: ?

## 2023-11-09 ENCOUNTER — Ambulatory Visit
Admit: 2023-11-09 | Discharge: 2023-11-10 | Payer: BLUE CROSS/BLUE SHIELD | Attending: Student in an Organized Health Care Education/Training Program | Primary: Student in an Organized Health Care Education/Training Program

## 2023-11-09 DIAGNOSIS — M67911 Unspecified disorder of synovium and tendon, right shoulder: Principal | ICD-10-CM

## 2023-11-09 DIAGNOSIS — M7521 Bicipital tendinitis, right shoulder: Principal | ICD-10-CM

## 2023-11-09 DIAGNOSIS — M5412 Radiculopathy, cervical region: Principal | ICD-10-CM

## 2023-11-11 MED ORDER — BUPROPION HCL XL 300 MG 24 HR TABLET, EXTENDED RELEASE
ORAL_TABLET | Freq: Every day | ORAL | 1 refills | 90.00000 days | Status: CP
Start: 2023-11-11 — End: ?

## 2023-11-14 DIAGNOSIS — G8929 Other chronic pain: Principal | ICD-10-CM

## 2023-11-14 DIAGNOSIS — M542 Cervicalgia: Principal | ICD-10-CM

## 2023-11-14 DIAGNOSIS — M545 Chronic midline low back pain without sciatica: Principal | ICD-10-CM

## 2023-11-14 MED ORDER — MELOXICAM 15 MG TABLET
ORAL_TABLET | Freq: Every day | ORAL | 1 refills | 30.00000 days | Status: CP
Start: 2023-11-14 — End: ?

## 2023-11-26 ENCOUNTER — Encounter
Admit: 2023-11-26 | Discharge: 2023-11-26 | Payer: BLUE CROSS/BLUE SHIELD | Attending: Student in an Organized Health Care Education/Training Program | Primary: Student in an Organized Health Care Education/Training Program

## 2023-11-26 ENCOUNTER — Encounter: Admit: 2023-11-26 | Discharge: 2023-11-26 | Payer: BLUE CROSS/BLUE SHIELD

## 2023-11-26 DIAGNOSIS — M329 Systemic lupus erythematosus, unspecified: Principal | ICD-10-CM

## 2023-12-06 DIAGNOSIS — M542 Cervicalgia: Principal | ICD-10-CM

## 2023-12-06 DIAGNOSIS — G8929 Other chronic pain: Principal | ICD-10-CM

## 2023-12-06 DIAGNOSIS — G4486 Cervicogenic headache: Principal | ICD-10-CM

## 2023-12-06 MED ORDER — TIZANIDINE 4 MG TABLET
ORAL_TABLET | 0 refills | 0.00000 days
Start: 2023-12-06 — End: ?

## 2023-12-10 MED ORDER — TIZANIDINE 4 MG TABLET
ORAL_TABLET | 0 refills | 0.00000 days
Start: 2023-12-10 — End: ?

## 2023-12-21 MED ORDER — PEG 3350-ELECTROLYTES 236 GRAM-22.74 GRAM-6.74 GRAM-5.86 GRAM SOLUTION
0 refills | 0.00000 days | Status: CP
Start: 2023-12-21 — End: ?

## 2023-12-28 ENCOUNTER — Encounter: Admit: 2023-12-28 | Discharge: 2023-12-29 | Payer: BLUE CROSS/BLUE SHIELD

## 2024-01-01 DIAGNOSIS — K7581 Nonalcoholic steatohepatitis (NASH): Principal | ICD-10-CM

## 2024-01-01 DIAGNOSIS — E66811 Class 1 obesity: Principal | ICD-10-CM

## 2024-01-01 DIAGNOSIS — E785 Hyperlipidemia, unspecified: Principal | ICD-10-CM

## 2024-01-01 DIAGNOSIS — K7460 Unspecified cirrhosis of liver: Principal | ICD-10-CM

## 2024-01-01 MED ORDER — OZEMPIC 2 MG/DOSE (8 MG/3 ML) SUBCUTANEOUS PEN INJECTOR
SUBCUTANEOUS | 1 refills | 0.00000 days | Status: CP
Start: 2024-01-01 — End: ?

## 2024-01-25 ENCOUNTER — Encounter: Admit: 2024-01-25 | Discharge: 2024-01-26 | Payer: BLUE CROSS/BLUE SHIELD

## 2024-02-05 ENCOUNTER — Ambulatory Visit
Admit: 2024-02-05 | Discharge: 2024-02-06 | Payer: BLUE CROSS/BLUE SHIELD | Attending: Student in an Organized Health Care Education/Training Program | Primary: Student in an Organized Health Care Education/Training Program

## 2024-02-05 DIAGNOSIS — M545 Chronic midline low back pain without sciatica: Principal | ICD-10-CM

## 2024-02-05 DIAGNOSIS — M329 Systemic lupus erythematosus, unspecified: Principal | ICD-10-CM

## 2024-02-05 DIAGNOSIS — G8929 Other chronic pain: Principal | ICD-10-CM

## 2024-02-05 DIAGNOSIS — M542 Cervicalgia: Principal | ICD-10-CM

## 2024-02-05 MED ORDER — MELOXICAM 15 MG TABLET
ORAL_TABLET | Freq: Every day | ORAL | 1 refills | 30.00000 days | Status: CP
Start: 2024-02-05 — End: ?

## 2024-02-05 MED ORDER — PREDNISONE 10 MG TABLET
ORAL_TABLET | ORAL | 0 refills | 18.00000 days | Status: CP
Start: 2024-02-05 — End: 2024-02-23

## 2024-02-21 ENCOUNTER — Encounter
Admit: 2024-02-21 | Discharge: 2024-02-21 | Payer: BLUE CROSS/BLUE SHIELD | Attending: Geriatric Medicine | Primary: Geriatric Medicine

## 2024-02-21 DIAGNOSIS — M329 Systemic lupus erythematosus, unspecified: Principal | ICD-10-CM

## 2024-02-21 MED ORDER — CEFUROXIME AXETIL 250 MG TABLET
ORAL_TABLET | Freq: Two times a day (BID) | ORAL | 0 refills | 10.00000 days | Status: CP
Start: 2024-02-21 — End: ?

## 2024-02-21 MED ORDER — PREDNISONE 10 MG TABLET
ORAL_TABLET | ORAL | 0 refills | 8.00000 days | Status: CP
Start: 2024-02-21 — End: 2024-02-29

## 2024-02-25 MED ORDER — ONDANSETRON 4 MG DISINTEGRATING TABLET
ORAL_TABLET | Freq: Three times a day (TID) | 0 refills | 3.00000 days | Status: CP | PRN
Start: 2024-02-25 — End: 2024-02-28

## 2024-02-26 ENCOUNTER — Encounter
Admit: 2024-02-26 | Discharge: 2024-02-26 | Payer: BLUE CROSS/BLUE SHIELD | Attending: Anesthesiology | Primary: Anesthesiology

## 2024-02-26 ENCOUNTER — Inpatient Hospital Stay: Admit: 2024-02-26 | Discharge: 2024-02-26 | Payer: BLUE CROSS/BLUE SHIELD

## 2024-03-12 DIAGNOSIS — Z1231 Encounter for screening mammogram for malignant neoplasm of breast: Principal | ICD-10-CM

## 2024-03-19 ENCOUNTER — Inpatient Hospital Stay: Admit: 2024-03-19 | Discharge: 2024-03-19 | Payer: BLUE CROSS/BLUE SHIELD

## 2024-03-21 ENCOUNTER — Encounter: Admit: 2024-03-21 | Discharge: 2024-03-22 | Payer: BLUE CROSS/BLUE SHIELD

## 2024-04-04 DIAGNOSIS — K1120 Sialoadenitis, unspecified: Principal | ICD-10-CM

## 2024-04-04 DIAGNOSIS — M3500 Sicca syndrome, unspecified: Principal | ICD-10-CM

## 2024-04-05 DIAGNOSIS — M545 Chronic midline low back pain without sciatica: Principal | ICD-10-CM

## 2024-04-05 DIAGNOSIS — M542 Cervicalgia: Principal | ICD-10-CM

## 2024-04-05 DIAGNOSIS — G8929 Other chronic pain: Principal | ICD-10-CM

## 2024-04-05 DIAGNOSIS — M329 Systemic lupus erythematosus, unspecified: Principal | ICD-10-CM

## 2024-04-05 MED ORDER — MELOXICAM 15 MG TABLET
ORAL_TABLET | Freq: Every day | ORAL | 1 refills | 0.00000 days
Start: 2024-04-05 — End: ?

## 2024-04-07 MED ORDER — MELOXICAM 15 MG TABLET
ORAL_TABLET | Freq: Every day | ORAL | 1 refills | 30.00000 days
Start: 2024-04-07 — End: ?

## 2024-04-10 ENCOUNTER — Encounter: Admit: 2024-04-10 | Discharge: 2024-04-11 | Payer: BLUE CROSS/BLUE SHIELD

## 2024-04-10 ENCOUNTER — Ambulatory Visit: Admit: 2024-04-10 | Discharge: 2024-04-11 | Payer: BLUE CROSS/BLUE SHIELD

## 2024-04-10 DIAGNOSIS — J301 Allergic rhinitis due to pollen: Principal | ICD-10-CM

## 2024-04-10 DIAGNOSIS — J324 Chronic pansinusitis: Principal | ICD-10-CM

## 2024-04-10 MED ORDER — AZELASTINE 137 MCG (0.1 %) NASAL SPRAY
Freq: Two times a day (BID) | NASAL | 11 refills | 4.00000 days | Status: CP
Start: 2024-04-10 — End: 2025-04-10

## 2024-04-15 ENCOUNTER — Inpatient Hospital Stay: Admit: 2024-04-15 | Discharge: 2024-04-15 | Payer: BLUE CROSS/BLUE SHIELD

## 2024-04-16 ENCOUNTER — Encounter: Admit: 2024-04-16 | Discharge: 2024-04-17 | Payer: BLUE CROSS/BLUE SHIELD

## 2024-04-21 DIAGNOSIS — R600 Localized edema: Principal | ICD-10-CM

## 2024-05-05 ENCOUNTER — Ambulatory Visit: Admit: 2024-05-05 | Discharge: 2024-05-05 | Payer: Medicaid (Managed Care)

## 2024-05-05 ENCOUNTER — Ambulatory Visit: Admit: 2024-05-05 | Discharge: 2024-05-05 | Payer: BLUE CROSS/BLUE SHIELD

## 2024-05-05 ENCOUNTER — Inpatient Hospital Stay: Admit: 2024-05-05 | Discharge: 2024-05-05 | Payer: BLUE CROSS/BLUE SHIELD

## 2024-05-05 DIAGNOSIS — Z006 Encounter for examination for normal comparison and control in clinical research program: Principal | ICD-10-CM

## 2024-05-05 DIAGNOSIS — K7581 Nonalcoholic steatohepatitis (NASH): Principal | ICD-10-CM

## 2024-05-05 DIAGNOSIS — K7469 Other cirrhosis of liver: Principal | ICD-10-CM

## 2024-05-05 MED ORDER — OMEPRAZOLE 20 MG CAPSULE,DELAYED RELEASE
ORAL_CAPSULE | Freq: Every day | ORAL | 2 refills | 30.00000 days | Status: CP
Start: 2024-05-05 — End: 2024-08-03

## 2024-05-06 DIAGNOSIS — R519 Acute nonintractable headache, unspecified headache type: Principal | ICD-10-CM

## 2024-05-06 DIAGNOSIS — K7469 Other cirrhosis of liver: Principal | ICD-10-CM

## 2024-05-06 DIAGNOSIS — K219 Gastro-esophageal reflux disease without esophagitis: Principal | ICD-10-CM

## 2024-05-06 DIAGNOSIS — Z0000 Encounter for general adult medical examination without abnormal findings: Principal | ICD-10-CM

## 2024-05-06 DIAGNOSIS — E66811 Class 1 obesity: Principal | ICD-10-CM

## 2024-05-06 DIAGNOSIS — K7581 Nonalcoholic steatohepatitis (NASH): Principal | ICD-10-CM

## 2024-05-06 DIAGNOSIS — R42 Dizziness and giddiness: Principal | ICD-10-CM

## 2024-05-06 DIAGNOSIS — Z23 Encounter for immunization: Principal | ICD-10-CM

## 2024-05-06 MED ORDER — SUMATRIPTAN 50 MG TABLET
ORAL_TABLET | Freq: Once | ORAL | 2 refills | 0.00000 days | Status: CP | PRN
Start: 2024-05-06 — End: 2025-05-07

## 2024-05-06 MED ORDER — OZEMPIC 2 MG/DOSE (8 MG/3 ML) SUBCUTANEOUS PEN INJECTOR
SUBCUTANEOUS | 3 refills | 0.00000 days | Status: CP
Start: 2024-05-06 — End: ?

## 2024-05-09 DIAGNOSIS — M329 Systemic lupus erythematosus, unspecified: Principal | ICD-10-CM

## 2024-05-09 DIAGNOSIS — M3500 Sicca syndrome, unspecified: Principal | ICD-10-CM

## 2024-05-09 MED ORDER — HYDROXYCHLOROQUINE 200 MG TABLET
ORAL_TABLET | Freq: Two times a day (BID) | ORAL | 3 refills | 0.00000 days
Start: 2024-05-09 — End: ?

## 2024-05-12 MED ORDER — HYDROXYCHLOROQUINE 200 MG TABLET
ORAL_TABLET | Freq: Two times a day (BID) | ORAL | 3 refills | 90.00000 days | Status: CP
Start: 2024-05-12 — End: ?

## 2024-05-16 ENCOUNTER — Encounter: Admit: 2024-05-16 | Discharge: 2024-05-17 | Payer: BLUE CROSS/BLUE SHIELD

## 2024-05-16 DIAGNOSIS — M329 Systemic lupus erythematosus, unspecified: Principal | ICD-10-CM

## 2024-05-19 MED ORDER — BUPROPION HCL XL 300 MG 24 HR TABLET, EXTENDED RELEASE
ORAL_TABLET | Freq: Every day | ORAL | 1 refills | 90.00000 days | Status: CP
Start: 2024-05-19 — End: ?

## 2024-05-31 DIAGNOSIS — M329 Systemic lupus erythematosus, unspecified: Principal | ICD-10-CM

## 2024-06-06 ENCOUNTER — Encounter: Admit: 2024-06-06 | Discharge: 2024-06-06 | Payer: BLUE CROSS/BLUE SHIELD

## 2024-06-06 ENCOUNTER — Inpatient Hospital Stay: Admit: 2024-06-06 | Discharge: 2024-06-06 | Payer: BLUE CROSS/BLUE SHIELD

## 2024-06-06 MED ORDER — ONDANSETRON 4 MG DISINTEGRATING TABLET
ORAL_TABLET | Freq: Three times a day (TID) | ORAL | 0 refills | 7.00000 days | Status: CP | PRN
Start: 2024-06-06 — End: 2024-09-04

## 2024-06-06 MED ORDER — OXYCODONE 5 MG TABLET
ORAL_TABLET | ORAL | 0 refills | 2.00000 days | Status: CP | PRN
Start: 2024-06-06 — End: 2024-06-11

## 2024-06-06 MED ORDER — METHYLPREDNISOLONE 4 MG TABLETS IN A DOSE PACK
ORAL | 0 refills | 0.00000 days | Status: CP
Start: 2024-06-06 — End: ?

## 2024-06-10 MED ORDER — METFORMIN ER 500 MG TABLET,EXTENDED RELEASE 24 HR
ORAL_TABLET | Freq: Two times a day (BID) | ORAL | 3 refills | 0.00000 days
Start: 2024-06-10 — End: ?

## 2024-06-13 ENCOUNTER — Encounter: Admit: 2024-06-13 | Discharge: 2024-06-14 | Payer: BLUE CROSS/BLUE SHIELD

## 2024-06-13 MED ORDER — METFORMIN ER 500 MG TABLET,EXTENDED RELEASE 24 HR
ORAL_TABLET | Freq: Two times a day (BID) | ORAL | 3 refills | 60.00000 days | Status: CP
Start: 2024-06-13 — End: ?
